# Patient Record
Sex: Female | Born: 1950 | Race: White | Hispanic: No | Smoking: Former smoker
Health system: Southern US, Community
[De-identification: ages and names within clinical notes are randomized; demographics above are authoritative.]

## PROBLEM LIST (undated history)

## (undated) DIAGNOSIS — Z8619 Personal history of other infectious and parasitic diseases: Secondary | ICD-10-CM

## (undated) DIAGNOSIS — I10 Essential (primary) hypertension: Secondary | ICD-10-CM

## (undated) DIAGNOSIS — M1990 Unspecified osteoarthritis, unspecified site: Secondary | ICD-10-CM

## (undated) DIAGNOSIS — C50919 Malignant neoplasm of unspecified site of unspecified female breast: Secondary | ICD-10-CM

## (undated) DIAGNOSIS — M8580 Other specified disorders of bone density and structure, unspecified site: Secondary | ICD-10-CM

## (undated) DIAGNOSIS — E785 Hyperlipidemia, unspecified: Secondary | ICD-10-CM

## (undated) DIAGNOSIS — I2510 Atherosclerotic heart disease of native coronary artery without angina pectoris: Secondary | ICD-10-CM

## (undated) DIAGNOSIS — S82839A Other fracture of upper and lower end of unspecified fibula, initial encounter for closed fracture: Secondary | ICD-10-CM

## (undated) DIAGNOSIS — I5189 Other ill-defined heart diseases: Secondary | ICD-10-CM

## (undated) DIAGNOSIS — I509 Heart failure, unspecified: Secondary | ICD-10-CM

## (undated) HISTORY — DX: Personal history of other infectious and parasitic diseases: Z86.19

## (undated) HISTORY — DX: Atherosclerotic heart disease of native coronary artery without angina pectoris: I25.10

## (undated) HISTORY — DX: Other fracture of upper and lower end of unspecified fibula, initial encounter for closed fracture: S82.839A

## (undated) HISTORY — DX: Essential (primary) hypertension: I10

## (undated) HISTORY — DX: Heart failure, unspecified: I50.9

## (undated) HISTORY — DX: Unspecified osteoarthritis, unspecified site: M19.90

## (undated) HISTORY — PX: MOLE REMOVAL: SHX2046

## (undated) HISTORY — DX: Hyperlipidemia, unspecified: E78.5

## (undated) HISTORY — PX: CATARACT EXTRACTION: SUR2

## (undated) HISTORY — DX: Malignant neoplasm of unspecified site of unspecified female breast: C50.919

## (undated) HISTORY — DX: Other specified disorders of bone density and structure, unspecified site: M85.80

---
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
## 1983-09-12 HISTORY — PX: ABDOMINAL HYSTERECTOMY: SHX81

## 1991-06-27 ENCOUNTER — Encounter: Payer: Self-pay | Admitting: Family Medicine

## 2000-01-12 ENCOUNTER — Encounter: Admission: RE | Admit: 2000-01-12 | Discharge: 2000-01-12 | Payer: Self-pay | Admitting: Emergency Medicine

## 2000-01-12 ENCOUNTER — Encounter: Payer: Self-pay | Admitting: Emergency Medicine

## 2001-01-17 ENCOUNTER — Encounter: Admission: RE | Admit: 2001-01-17 | Discharge: 2001-01-17 | Payer: Self-pay | Admitting: Emergency Medicine

## 2001-01-17 ENCOUNTER — Encounter: Payer: Self-pay | Admitting: Emergency Medicine

## 2002-01-30 ENCOUNTER — Encounter: Payer: Self-pay | Admitting: Emergency Medicine

## 2002-01-30 ENCOUNTER — Encounter: Admission: RE | Admit: 2002-01-30 | Discharge: 2002-01-30 | Payer: Self-pay | Admitting: Emergency Medicine

## 2002-02-05 ENCOUNTER — Encounter: Admission: RE | Admit: 2002-02-05 | Discharge: 2002-02-05 | Payer: Self-pay | Admitting: Emergency Medicine

## 2002-02-05 ENCOUNTER — Encounter: Payer: Self-pay | Admitting: Emergency Medicine

## 2002-08-26 ENCOUNTER — Encounter: Admission: RE | Admit: 2002-08-26 | Discharge: 2002-08-26 | Payer: Self-pay | Admitting: Emergency Medicine

## 2002-08-26 ENCOUNTER — Encounter: Payer: Self-pay | Admitting: Emergency Medicine

## 2002-09-10 ENCOUNTER — Encounter: Payer: Self-pay | Admitting: Emergency Medicine

## 2002-09-10 ENCOUNTER — Encounter: Admission: RE | Admit: 2002-09-10 | Discharge: 2002-09-10 | Payer: Self-pay | Admitting: Emergency Medicine

## 2002-09-11 DIAGNOSIS — C50919 Malignant neoplasm of unspecified site of unspecified female breast: Secondary | ICD-10-CM

## 2002-09-11 HISTORY — PX: MASTECTOMY: SHX3

## 2002-09-11 HISTORY — DX: Malignant neoplasm of unspecified site of unspecified female breast: C50.919

## 2002-09-15 ENCOUNTER — Encounter (INDEPENDENT_AMBULATORY_CARE_PROVIDER_SITE_OTHER): Payer: Self-pay | Admitting: *Deleted

## 2002-09-15 ENCOUNTER — Encounter: Payer: Self-pay | Admitting: Emergency Medicine

## 2002-09-15 ENCOUNTER — Encounter: Admission: RE | Admit: 2002-09-15 | Discharge: 2002-09-15 | Payer: Self-pay | Admitting: Emergency Medicine

## 2002-10-12 HISTORY — PX: BREAST SURGERY: SHX581

## 2002-10-24 ENCOUNTER — Encounter: Payer: Self-pay | Admitting: General Surgery

## 2002-10-28 ENCOUNTER — Encounter: Payer: Self-pay | Admitting: General Surgery

## 2002-10-28 ENCOUNTER — Ambulatory Visit (HOSPITAL_COMMUNITY): Admission: RE | Admit: 2002-10-28 | Discharge: 2002-10-29 | Payer: Self-pay | Admitting: General Surgery

## 2002-10-28 ENCOUNTER — Encounter (INDEPENDENT_AMBULATORY_CARE_PROVIDER_SITE_OTHER): Payer: Self-pay | Admitting: *Deleted

## 2003-04-06 ENCOUNTER — Encounter: Payer: Self-pay | Admitting: Emergency Medicine

## 2003-04-06 ENCOUNTER — Encounter: Admission: RE | Admit: 2003-04-06 | Discharge: 2003-04-06 | Payer: Self-pay | Admitting: Emergency Medicine

## 2004-05-23 ENCOUNTER — Encounter: Admission: RE | Admit: 2004-05-23 | Discharge: 2004-05-23 | Payer: Self-pay | Admitting: General Surgery

## 2005-06-26 ENCOUNTER — Encounter: Admission: RE | Admit: 2005-06-26 | Discharge: 2005-06-26 | Payer: Self-pay | Admitting: Emergency Medicine

## 2005-06-28 ENCOUNTER — Encounter: Admission: RE | Admit: 2005-06-28 | Discharge: 2005-06-28 | Payer: Self-pay | Admitting: General Surgery

## 2005-07-12 ENCOUNTER — Encounter: Admission: RE | Admit: 2005-07-12 | Discharge: 2005-07-12 | Payer: Self-pay | Admitting: General Surgery

## 2006-05-09 LAB — HM COLONOSCOPY: HM Colonoscopy: ABNORMAL

## 2006-08-01 ENCOUNTER — Encounter: Admission: RE | Admit: 2006-08-01 | Discharge: 2006-08-01 | Payer: Self-pay | Admitting: General Surgery

## 2007-08-29 ENCOUNTER — Encounter: Admission: RE | Admit: 2007-08-29 | Discharge: 2007-08-29 | Payer: Self-pay | Admitting: Emergency Medicine

## 2007-09-12 LAB — CONVERTED CEMR LAB: Pap Smear: NORMAL

## 2008-04-11 HISTORY — PX: COLONOSCOPY: SHX174

## 2008-09-09 ENCOUNTER — Encounter: Admission: RE | Admit: 2008-09-09 | Discharge: 2008-09-09 | Payer: Self-pay | Admitting: General Surgery

## 2008-09-09 LAB — HM MAMMOGRAPHY: HM Mammogram: ABNORMAL

## 2008-09-17 ENCOUNTER — Encounter: Admission: RE | Admit: 2008-09-17 | Discharge: 2008-09-17 | Payer: Self-pay | Admitting: General Surgery

## 2008-09-28 ENCOUNTER — Ambulatory Visit: Payer: Self-pay | Admitting: Family Medicine

## 2008-09-28 DIAGNOSIS — D0510 Intraductal carcinoma in situ of unspecified breast: Secondary | ICD-10-CM | POA: Insufficient documentation

## 2008-09-28 DIAGNOSIS — E785 Hyperlipidemia, unspecified: Secondary | ICD-10-CM | POA: Insufficient documentation

## 2008-09-28 DIAGNOSIS — M1990 Unspecified osteoarthritis, unspecified site: Secondary | ICD-10-CM | POA: Insufficient documentation

## 2008-09-28 DIAGNOSIS — I10 Essential (primary) hypertension: Secondary | ICD-10-CM | POA: Insufficient documentation

## 2008-09-28 DIAGNOSIS — J309 Allergic rhinitis, unspecified: Secondary | ICD-10-CM | POA: Insufficient documentation

## 2008-09-28 DIAGNOSIS — E782 Mixed hyperlipidemia: Secondary | ICD-10-CM | POA: Insufficient documentation

## 2008-09-28 HISTORY — DX: Hyperlipidemia, unspecified: E78.5

## 2008-09-28 HISTORY — DX: Unspecified osteoarthritis, unspecified site: M19.90

## 2008-11-09 DIAGNOSIS — S82839A Other fracture of upper and lower end of unspecified fibula, initial encounter for closed fracture: Secondary | ICD-10-CM

## 2008-11-09 HISTORY — DX: Other fracture of upper and lower end of unspecified fibula, initial encounter for closed fracture: S82.839A

## 2008-11-16 ENCOUNTER — Ambulatory Visit: Payer: Self-pay | Admitting: Family Medicine

## 2008-11-16 DIAGNOSIS — M8580 Other specified disorders of bone density and structure, unspecified site: Secondary | ICD-10-CM

## 2008-11-16 HISTORY — DX: Other specified disorders of bone density and structure, unspecified site: M85.80

## 2008-11-17 ENCOUNTER — Telehealth: Payer: Self-pay | Admitting: Family Medicine

## 2008-11-23 ENCOUNTER — Ambulatory Visit: Payer: Self-pay | Admitting: Family Medicine

## 2008-12-10 ENCOUNTER — Ambulatory Visit: Payer: Self-pay | Admitting: Family Medicine

## 2008-12-10 DIAGNOSIS — M719 Bursopathy, unspecified: Secondary | ICD-10-CM

## 2008-12-10 DIAGNOSIS — M67919 Unspecified disorder of synovium and tendon, unspecified shoulder: Secondary | ICD-10-CM | POA: Insufficient documentation

## 2008-12-28 ENCOUNTER — Ambulatory Visit: Payer: Self-pay | Admitting: Family Medicine

## 2008-12-28 DIAGNOSIS — R5383 Other fatigue: Secondary | ICD-10-CM

## 2008-12-28 DIAGNOSIS — R5381 Other malaise: Secondary | ICD-10-CM | POA: Insufficient documentation

## 2008-12-28 LAB — CONVERTED CEMR LAB
ALT: 55 units/L — ABNORMAL HIGH (ref 0–35)
AST: 37 units/L (ref 0–37)
Albumin: 3.9 g/dL (ref 3.5–5.2)
Alkaline Phosphatase: 72 units/L (ref 39–117)
BUN: 12 mg/dL (ref 6–23)
Basophils Absolute: 0 10*3/uL (ref 0.0–0.1)
Basophils Relative: 0.5 % (ref 0.0–3.0)
Bilirubin, Direct: 0 mg/dL (ref 0.0–0.3)
CO2: 34 meq/L — ABNORMAL HIGH (ref 19–32)
Calcium: 9.3 mg/dL (ref 8.4–10.5)
Chloride: 102 meq/L (ref 96–112)
Cholesterol: 122 mg/dL (ref 0–200)
Creatinine, Ser: 0.6 mg/dL (ref 0.4–1.2)
Eosinophils Absolute: 0.1 10*3/uL (ref 0.0–0.7)
Eosinophils Relative: 2.1 % (ref 0.0–5.0)
GFR calc non Af Amer: 109.32 mL/min (ref >60)
Glucose, Bld: 101 mg/dL — ABNORMAL HIGH (ref 70–99)
HCT: 40.1 % (ref 36.0–46.0)
HDL: 41.7 mg/dL (ref >39.00)
Hemoglobin: 13.8 g/dL (ref 12.0–15.0)
LDL Cholesterol: 44 mg/dL (ref 0–99)
Lymphocytes Relative: 31.5 % (ref 12.0–46.0)
Lymphs Abs: 1.9 10*3/uL (ref 0.7–4.0)
MCHC: 34.5 g/dL (ref 30.0–36.0)
MCV: 87.3 fL (ref 78.0–100.0)
Monocytes Absolute: 0.6 10*3/uL (ref 0.1–1.0)
Monocytes Relative: 9.2 % (ref 3.0–12.0)
Neutro Abs: 3.5 10*3/uL (ref 1.4–7.7)
Neutrophils Relative %: 56.7 % (ref 43.0–77.0)
Platelets: 285 10*3/uL (ref 150.0–400.0)
Potassium: 3.3 meq/L — ABNORMAL LOW (ref 3.5–5.1)
RBC: 4.59 M/uL (ref 3.87–5.11)
RDW: 12.9 % (ref 11.5–14.6)
Sodium: 143 meq/L (ref 135–145)
TSH: 1.92 microintl units/mL (ref 0.35–5.50)
Total Bilirubin: 0.7 mg/dL (ref 0.3–1.2)
Total CHOL/HDL Ratio: 3
Total Protein: 7 g/dL (ref 6.0–8.3)
Triglycerides: 184 mg/dL — ABNORMAL HIGH (ref 0.0–149.0)
VLDL: 36.8 mg/dL (ref 0.0–40.0)
WBC: 6.1 10*3/uL (ref 4.5–10.5)

## 2008-12-29 ENCOUNTER — Encounter: Payer: Self-pay | Admitting: Family Medicine

## 2008-12-31 ENCOUNTER — Ambulatory Visit: Payer: Self-pay | Admitting: Family Medicine

## 2009-01-28 ENCOUNTER — Encounter: Payer: Self-pay | Admitting: Family Medicine

## 2009-03-30 ENCOUNTER — Ambulatory Visit: Payer: Self-pay | Admitting: Family Medicine

## 2009-03-31 ENCOUNTER — Ambulatory Visit: Payer: Self-pay | Admitting: Family Medicine

## 2009-03-31 LAB — CONVERTED CEMR LAB
ALT: 50 units/L — ABNORMAL HIGH (ref 0–35)
AST: 33 units/L (ref 0–37)
Albumin: 3.8 g/dL (ref 3.5–5.2)
Alkaline Phosphatase: 75 units/L (ref 39–117)
Bilirubin, Direct: 0 mg/dL (ref 0.0–0.3)
Total Bilirubin: 0.6 mg/dL (ref 0.3–1.2)
Total Protein: 6.9 g/dL (ref 6.0–8.3)

## 2009-04-01 ENCOUNTER — Encounter: Payer: Self-pay | Admitting: Family Medicine

## 2009-04-02 LAB — CONVERTED CEMR LAB
HCV Ab: NEGATIVE
Hep A IgM: NEGATIVE
Hep B C IgM: NEGATIVE
Hepatitis B Surface Ag: NEGATIVE

## 2009-04-05 ENCOUNTER — Ambulatory Visit: Payer: Self-pay | Admitting: Family Medicine

## 2009-10-05 ENCOUNTER — Ambulatory Visit: Payer: Self-pay | Admitting: Family Medicine

## 2009-10-08 LAB — CONVERTED CEMR LAB
ALT: 101 units/L — ABNORMAL HIGH (ref 0–35)
AST: 67 units/L — ABNORMAL HIGH (ref 0–37)
Albumin: 4.3 g/dL (ref 3.5–5.2)
Alkaline Phosphatase: 84 units/L (ref 39–117)
Bilirubin, Direct: 0.1 mg/dL (ref 0.0–0.3)
Cholesterol: 213 mg/dL — ABNORMAL HIGH (ref 0–200)
Direct LDL: 76.2 mg/dL
HDL: 50.9 mg/dL (ref >39.00)
Total Bilirubin: 0.6 mg/dL (ref 0.3–1.2)
Total CHOL/HDL Ratio: 4
Total Protein: 7.5 g/dL (ref 6.0–8.3)
Triglycerides: 268 mg/dL — ABNORMAL HIGH (ref 0.0–149.0)
VLDL: 53.6 mg/dL — ABNORMAL HIGH (ref 0.0–40.0)

## 2009-10-11 ENCOUNTER — Ambulatory Visit: Payer: Self-pay | Admitting: Family Medicine

## 2009-10-12 ENCOUNTER — Encounter: Admission: RE | Admit: 2009-10-12 | Discharge: 2009-10-12 | Payer: Self-pay | Admitting: General Surgery

## 2009-10-14 ENCOUNTER — Encounter: Admission: RE | Admit: 2009-10-14 | Discharge: 2009-10-14 | Payer: Self-pay | Admitting: Family Medicine

## 2009-10-15 ENCOUNTER — Ambulatory Visit: Payer: Self-pay | Admitting: Family Medicine

## 2009-10-15 LAB — CONVERTED CEMR LAB: Creatinine, Ser: 0.72 mg/dL (ref 0.40–1.20)

## 2009-10-21 ENCOUNTER — Ambulatory Visit: Payer: Self-pay | Admitting: Cardiology

## 2009-11-10 ENCOUNTER — Ambulatory Visit: Payer: Self-pay | Admitting: Family Medicine

## 2010-01-06 ENCOUNTER — Ambulatory Visit: Payer: Self-pay | Admitting: Family Medicine

## 2010-01-07 LAB — CONVERTED CEMR LAB
ALT: 81 units/L — ABNORMAL HIGH (ref 0–35)
AST: 53 units/L — ABNORMAL HIGH (ref 0–37)
Albumin: 3.9 g/dL (ref 3.5–5.2)
Alkaline Phosphatase: 81 units/L (ref 39–117)
Bilirubin, Direct: 0.1 mg/dL (ref 0.0–0.3)
Total Bilirubin: 0.5 mg/dL (ref 0.3–1.2)
Total Protein: 6.6 g/dL (ref 6.0–8.3)

## 2010-06-10 ENCOUNTER — Telehealth: Payer: Self-pay | Admitting: Family Medicine

## 2010-07-14 ENCOUNTER — Telehealth (INDEPENDENT_AMBULATORY_CARE_PROVIDER_SITE_OTHER): Payer: Self-pay | Admitting: *Deleted

## 2010-07-18 ENCOUNTER — Ambulatory Visit: Payer: Self-pay | Admitting: Family Medicine

## 2010-07-18 LAB — CONVERTED CEMR LAB
ALT: 57 units/L — ABNORMAL HIGH (ref 0–35)
AST: 37 units/L (ref 0–37)
Albumin: 4.2 g/dL (ref 3.5–5.2)
Alkaline Phosphatase: 79 units/L (ref 39–117)
BUN: 12 mg/dL (ref 6–23)
Basophils Absolute: 0 10*3/uL (ref 0.0–0.1)
Basophils Relative: 0 % (ref 0.0–3.0)
Bilirubin, Direct: 0 mg/dL (ref 0.0–0.3)
CO2: 33 meq/L — ABNORMAL HIGH (ref 19–32)
Calcium: 9.8 mg/dL (ref 8.4–10.5)
Chloride: 98 meq/L (ref 96–112)
Cholesterol: 289 mg/dL — ABNORMAL HIGH (ref 0–200)
Creatinine, Ser: 0.6 mg/dL (ref 0.4–1.2)
Direct LDL: 104.2 mg/dL
Eosinophils Absolute: 0.1 10*3/uL (ref 0.0–0.7)
Eosinophils Relative: 1.6 % (ref 0.0–5.0)
GFR calc non Af Amer: 113.07 mL/min (ref >60)
Glucose, Bld: 113 mg/dL — ABNORMAL HIGH (ref 70–99)
HCT: 42.6 % (ref 36.0–46.0)
HDL: 50.4 mg/dL (ref >39.00)
Hemoglobin: 14.6 g/dL (ref 12.0–15.0)
Lymphocytes Relative: 25.3 % (ref 12.0–46.0)
Lymphs Abs: 2.2 10*3/uL (ref 0.7–4.0)
MCHC: 34.3 g/dL (ref 30.0–36.0)
MCV: 88.8 fL (ref 78.0–100.0)
Monocytes Absolute: 0.8 10*3/uL (ref 0.1–1.0)
Monocytes Relative: 9.1 % (ref 3.0–12.0)
Neutro Abs: 5.5 10*3/uL (ref 1.4–7.7)
Neutrophils Relative %: 64 % (ref 43.0–77.0)
Platelets: 278 10*3/uL (ref 150.0–400.0)
Potassium: 3.9 meq/L (ref 3.5–5.1)
RBC: 4.8 M/uL (ref 3.87–5.11)
RDW: 14.1 % (ref 11.5–14.6)
Sodium: 140 meq/L (ref 135–145)
Total Bilirubin: 0.4 mg/dL (ref 0.3–1.2)
Total CHOL/HDL Ratio: 6
Total Protein: 6.9 g/dL (ref 6.0–8.3)
Triglycerides: 394 mg/dL — ABNORMAL HIGH (ref 0.0–149.0)
VLDL: 78.8 mg/dL — ABNORMAL HIGH (ref 0.0–40.0)
Vit D, 25-Hydroxy: 52 ng/mL (ref 30–89)
WBC: 8.5 10*3/uL (ref 4.5–10.5)

## 2010-08-30 ENCOUNTER — Ambulatory Visit: Payer: Self-pay | Admitting: Family Medicine

## 2010-08-31 DIAGNOSIS — L919 Hypertrophic disorder of the skin, unspecified: Secondary | ICD-10-CM | POA: Insufficient documentation

## 2010-08-31 DIAGNOSIS — L909 Atrophic disorder of skin, unspecified: Secondary | ICD-10-CM | POA: Insufficient documentation

## 2010-08-31 LAB — CONVERTED CEMR LAB
Cholesterol, target level: 200 mg/dL
HDL goal, serum: 40 mg/dL
LDL Goal: 130 mg/dL

## 2010-10-02 ENCOUNTER — Encounter: Payer: Self-pay | Admitting: Emergency Medicine

## 2010-10-02 ENCOUNTER — Encounter: Payer: Self-pay | Admitting: General Surgery

## 2010-10-11 NOTE — Assessment & Plan Note (Signed)
Summary: COUGH/CLE   Vital Signs:  Patient profile:   60 year old female Height:      66.25 inches Weight:      222.0 pounds BMI:     35.69 Temp:     97.8 degrees F oral Pulse rate:   76 / minute Pulse rhythm:   regular BP sitting:   110 / 70  (left arm) Cuff size:   large  Vitals Entered By: Benny Lennert CMA Duncan Dull) (November 10, 2009 9:25 AM)  History of Present Illness: Chief complaint cough and congestion for 1 week   This 60 Years Old White Female comes in today with complaints of cough, runny nose, and sore throat.   ros: no fever, chills, sweats, n, v, d, rashes, earache  GEN: WDWN, NAD; alert,appropriate and cooperative throughout exam HEENT: Normocephalic and atraumatic. Throat clear, w/o exudate, no LAD, R TM clear, L TM - good landmarks, No fluid present. rhinnorhea.  Left frontal and maxillary sinuses: NT Right frontal and maxillary sinuses: NT NECK: No ant or post LAD CV: RRR, No M/G/R PULM: no resp distress, no accessory muscles.  No retractions. no w/c/r ABD: S,NT,ND,+BS, No HSM EXTR: no c/c/e PSYCH: full affect, pleasant, conversant   Allergies: 1)  ! Sulfa 2)  ! Morphine 3)  ! Ace Inhibitors   Impression & Recommendations:  Problem # 1:  URI (ICD-465.9) I discussed upper respiratory tract infections with the patient and explained viral infections in general.  Recommended plenty of sleep. Symptomatic care with pushing fluids. Symptomatic care with over-the-counter expectorant, such as Mucinex DM or Robitussin-DM, including a cough suppressant. Oral acetaminophen or NSAIDs as tolerated for body aches, chills, fevers.  follow-up if acutely worsens   Her updated medication list for this problem includes:    Adult Aspirin Ec Low Strength 81 Mg Tbec (Aspirin) .[name] Kitchen... Take one tablet once daily  Complete Medication List: 1)  Fluoxetine Hcl 20 Mg Tabs (Fluoxetine hcl) .... Take one tablet once daily 2)  Chlorthalidone 25 Mg Tabs (Chlorthalidone) ....  Take one tablet once daily 3)  Multivitamins Tabs (Multiple vitamin) .... Take one tablet once daily 4)  One-a-day Calcium Plus 500-100-50 Mg-unit-mg Chew (Calcium-magnesium-vitamin d) .... Take one tablet once daily 5)  Adult Aspirin Ec Low Strength 81 Mg Tbec (Aspirin) .... Take one tablet once daily 6)  Osteo Bi-flex Adv Double St Tabs (Misc natural products) .... Take two tablets once daily 7)  Vision Formula Tabs (Multiple vitamins-minerals) .... Take one tablet once daily 8)  Vitamin D 3000 Units  .... Take one tablet once daily 9)  Hycodan Suspension  .[name] Kitchen.. 1 tsp by mouth at bedtime Prescriptions: HYCODAN SUSPENSION 1 tsp by mouth at bedtime  #8 oz x 0   Entered and Authorized by:   Hannah Beat MD   Signed by:   Hannah Beat MD on 11/10/2009   Method used:   Print then Give to Patient   RxID:   [id number] HYCODAN SUSPENSION 1 tsp by mouth at bedtime  #8 oz x 0   Entered and Authorized by:   Hannah Beat MD   Signed by:   Hannah Beat MD on 11/10/2009   Method used:   Print then Give to Patient   RxID:   [id number]   Current Allergies (reviewed today): ! SULFA ! MORPHINE ! ACE INHIBITORS

## 2010-10-11 NOTE — Letter (Signed)
Summary: HAND REHABILITATION SPEC / PHYSICAL THERAPY STATUS REPORT  HAND REHABILITATION SPEC / PHYSICAL THERAPY STATUS REPORT   Imported By: Carin Primrose 12/29/2008 12:44:00  _____________________________________________________________________  External Attachment:    Type:   Image     Comment:   External Document  Appended Document: HAND REHABILITATION SPEC / PHYSICAL THERAPY STATUS REPORT altered order and faxed

## 2010-10-11 NOTE — Assessment & Plan Note (Signed)
Summary: NEW PT TO EST/CLE   Vital Signs:  Patient Profile:   60 Years Old Female Height:     66.25 inches (168.28 cm) Weight:      223.50 pounds (101.59 kg) Temp:     98.4 degrees F (36.89 degrees C) oral Pulse rate:   76 / minute Pulse rhythm:   regular BP sitting:   126 / 84  (left arm) Cuff size:   large  Vitals Entered By: Silas Sacramento (September 28, 2008 10:59 AM)                 Chief Complaint:  New pt.  History of Present Illness: 60 year old female patient here to establish care as new patient in our office:  1. 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 No chemo or rads.  2. HTN, stable, on Cozaar and chlorthalidone.  3. Hyperlipidemia, now on Crestor 40 mg, labs last checked in the fall.   4. Estrogen deficiency s/p oophorectomy, on Prozac for hot flashes. Had been on estrogen for years. All her paps have been normal post hysterectomy. Should not need Paps.  DEXA, on some Calcium and Vit D.     Current Allergies (reviewed today): ! SULFA ! MORPHINE ! ACE INHIBITORS  Past Medical History:    Hyperlipidemia    Hypertension    Allergic rhinitis    Breast cancer, hx of, L mastectomy, No c/r    Osteoarthritis        Former Dr. Lorenz Coaster pt.  Past Surgical History:    Hysterectomy, 1985, endometriosis, anatomy    Tonsillectomy, 1958    Mastectomy, 2004        DEXA, repeat 08/2010   Family History:    Alcoholism, Drug Addiction: 0    Colon CA: 0    Ovarian/Uterine CA: Aunt - Ovarian    Breast CA: Sister, 26    Lung CA: 0    Prostate CA: others    CAD: Father, MI, 53    CVA: 0    Sudden death < 50: 0    DM: GP    Mental Illness: 0   Social History:    Married    [place], 36 years    Never Smoked    Alcohol use-no    Drug use-no    Regular exercise-no   Risk Factors:  Tobacco use:  never Drug use:  no Alcohol use:  no Exercise:  no  Mammogram History:     Date of Last Mammogram:  09/09/2008    Mammogram History:     Date of Last Mammogram:  09/09/2008    Results:  abnormal   PAP Smear History:     Date of Last PAP Smear:  09/12/2007   PAP Smear History:     Date of Last PAP Smear:  09/12/2007    Results:  normal   Colonoscopy History:     Date of Last Colonoscopy:  09/11/2005   Colonoscopy History:     Date of Last Colonoscopy:  09/11/2005    Results:  normal    Review of Systems      See HPI       Otherwise, the pertinent positives and negatives are listed above and in the HPI, otherwise a full review of systems has been reviewed and is negative unless noted positive.   General      Denies chills, fever, sweats, weakness, and weight loss.  ENT      mouth irritated  and rubbed raw by dental partial, has upcoming appt.  CV      Denies chest pain or discomfort.  Resp      Denies shortness of breath.  GI      Denies nausea and vomiting.  GU      Denies urinary frequency and urinary hesitancy.  MS      some neck OA, bothering her now  Derm      Denies rash.  Neuro      Denies numbness and tingling.  Psych      Denies anxiety and depression.   Physical Exam  General:     Well-developed,well-nourished,in no acute distress; alert,appropriate and cooperative throughout examination Head:     Normocephalic and atraumatic without obvious abnormalities. No apparent alopecia or balding. Eyes:     vision grossly intact.   Ears:     External ear exam shows no significant lesions or deformities.  Otoscopic examination reveals clear canals, tympanic membranes are intact bilaterally without bulging, retraction, inflammation or discharge. Hearing is grossly normal bilaterally. Nose:     no external deformity.   Mouth:     Oral mucosa and oropharynx without lesions or exudates.  Teeth in good repair. Neck:     No deformities, masses, or tenderness noted. Lungs:     Normal respiratory effort, chest expands symmetrically. Lungs are clear to auscultation,  no crackles or wheezes. Heart:     Normal rate and regular rhythm. S1 and S2 normal without gallop, murmur, click, rub or other extra sounds. Abdomen:     Bowel sounds positive,abdomen soft and non-tender without masses, organomegaly or hernias noted. Msk:     normal ROM.   Extremities:     No clubbing, cyanosis, edema, or deformity noted with normal full range of motion of all joints.   Neurologic:     alert & oriented X3, sensation intact to light touch, and gait normal.   Skin:     Intact without suspicious lesions or rashes Cervical Nodes:     No lymphadenopathy noted Psych:     Cognition and judgment appear intact. Alert and cooperative with normal attention span and concentration. No apparent delusions, illusions, hallucinations    Impression & Recommendations:  Problem # 1:  HYPERTENSION (ICD-401.9) Assessment: New Cont current meds.  We will obtain records from the patient's prior physicians.   Her updated medication list for this problem includes:    Cozaar 50 Mg Tabs (Losartan potassium) .[name] Kitchen... Take one tablet once daily    Chlorthalidone 25 Mg Tabs (Chlorthalidone) .[name] Kitchen... Take one tablet once daily   Problem # 2:  HYPERLIPIDEMIA (ICD-272.4) Assessment: New Cont, and fish oil, recheck 3 mo  Her updated medication list for this problem includes:    Crestor 40 Mg Tabs (Rosuvastatin calcium) .[name] Kitchen... Take one tablet once daily   Problem # 3:  BREAST CANCER, HX OF (ICD-V10.3) Assessment: New doing well, s/p mastectomy  We will obtain records from the patient's prior physicians.   Problem # 4:  HOT FLASHES (ICD-627.2) Assessment: New Prozac is reasonable, no estrogen.  Should not need yearly Paps, given all normal paps post surgery, 1980's, and oophorectomy, too.  Problem # 5:  UNSPECIFIED VITAMIN D DEFICIENCY (ICD-268.9) Assessment: New Cont supplement. Recheck in April.  Complete Medication List: 1)  Cozaar 50 Mg Tabs (Losartan potassium) .... Take one  tablet once daily 2)  Fluoxetine Hcl 20 Mg Tabs (Fluoxetine hcl) .... Take one tablet once daily 3)  Crestor 40 Mg Tabs (  Rosuvastatin calcium) .... Take one tablet once daily 4)  Chlorthalidone 25 Mg Tabs (Chlorthalidone) .... Take one tablet once daily 5)  Multivitamins Tabs (Multiple vitamin) .... Take one tablet once daily 6)  One-a-day Calcium Plus 500-100-50 Mg-unit-mg Chew (Calcium-magnesium-vitamin d) .... Take one tablet once daily 7)  Adult Aspirin Ec Low Strength 81 Mg Tbec (Aspirin) .... Take one tablet once daily 8)  Osteo Bi-flex Adv Double St Tabs (Misc natural products) .... Take two tablets once daily 9)  Fish Oil 1000 Mg Caps (Omega-3 fatty acids) .... Take four tablets once daily 10)  Vision Formula Tabs (Multiple vitamins-minerals) .... Take one tablet once daily 11)  Vitamin D 3000 Units  .... Take one tablet once daily   Patient Instructions: 1)  follow-up April, fasting office visit.    Prior Medications (reviewed today): COZAAR 50 MG TABS (LOSARTAN POTASSIUM) take one tablet once daily FLUOXETINE HCL 20 MG TABS (FLUOXETINE HCL) take one tablet once daily CRESTOR 40 MG TABS (ROSUVASTATIN CALCIUM) take one tablet once daily CHLORTHALIDONE 25 MG TABS (CHLORTHALIDONE) take one tablet once daily MULTIVITAMINS  TABS (MULTIPLE VITAMIN) take one tablet once daily ONE-A-DAY CALCIUM PLUS 500-100-50 MG-UNIT-MG CHEW (CALCIUM-MAGNESIUM-VITAMIN D) take one tablet once daily ADULT ASPIRIN EC LOW STRENGTH 81 MG TBEC (ASPIRIN) take one tablet once daily OSTEO BI-FLEX ADV DOUBLE ST  TABS (MISC NATURAL PRODUCTS) take two tablets once daily FISH OIL 1000 MG CAPS (OMEGA-3 FATTY ACIDS) take four tablets once daily VISION FORMULA  TABS (MULTIPLE VITAMINS-MINERALS) take one tablet once daily VITAMIN D 3000 UNITS () take one tablet once daily Current Allergies (reviewed today): ! SULFA ! MORPHINE ! ACE INHIBITORS  Flu Vaccine Result Date:  08/11/2008 Flu Vaccine Result:  given  Colonoscopy Result Date:  09/11/2005 Colonoscopy Result:  normal PAP Result Date:  09/12/2007 PAP Result:  normal PAP Next Due:  Not Indicated Mammogram Result Date:  09/09/2008 Mammogram Result:  abnormal had f/u Mammo and u/s 09/18/07 - OK, repeat 1 year only

## 2010-10-11 NOTE — Assessment & Plan Note (Signed)
Summary: ROA 6 MTHS CYD   Vital Signs:  Patient profile:   60 year old female Height:      66.25 inches Weight:      219.8 pounds BMI:     35.34 Temp:     97.9 degrees F oral Pulse rate:   76 / minute Pulse rhythm:   regular BP sitting:   122 / 76  (left arm) Cuff size:   large  Vitals Entered By: Benny Lennert CMA Duncan Dull) (October 11, 2009 3:21 PM)  History of Present Illness: Chief complaint 6 month follow up  Elevated transaminases: labs reviewed with her, and these are trending upwards. No alcohol use. Off hepatotoxic meds. Hepatitis negative   HTN: BP at goal  mammogram, upcoming tomorrow  flu shot   u/s liver  ROS: GEN: No acute illnesses, no fevers, chills, sweats, fatigue, weight loss, or URI sx. GI: No n/v/d Pulm: No SOB, cough, wheezing Interactive and getting along well at home.  Otherwise, ROS is as per the HPI.   GEN: WDWN, NAD, Non-toxic, A & O x 3 HEENT: Atraumatic, Normocephalic. Neck supple. No masses, No LAD. Ears and Nose: No external deformity. CV: RRR, No M/G/R. No JVD. No thrill. No extra heart sounds. PULM: CTA B, no wheezes, crackles, rhonchi. No retractions. No resp. distress. No accessory muscle use. EXTR: No c/c/e NEURO: Normal gait.  PSYCH: Normally interactive. Conversant. Not depressed or anxious appearing.  Calm demeanor.      Allergies: 1)  ! Sulfa 2)  ! Morphine 3)  ! Ace Inhibitors  Past History:  Past medical, surgical, family and social histories (including risk factors) reviewed, and no changes noted (except as noted below).  Past Medical History: Reviewed history from 12/10/2008 and no changes required. 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 Hep B + by [facility] Foot fracture, 1991 L fibular avulsion fx and sprain, 11/2008  Former Dr. Lorenz Coaster pt. [facility] Sur[facility], Dr. Francina Ames - NOW Dr. Claud Kelp GI:  Dr. Everardo All. [facility] = [facility], Dorcas Mcmurray  Past Surgical History: Reviewed history from 10/13/2008 and no changes required. Hysterectomy, 1985, endometriosis, anatomy Tonsillectomy, 1958 Mastectomy, 2004  DEXA, repeat 08/2010 Colonoscopy, 8/09, Hyperplastic Polyp, repeat 10 years  Family History: Reviewed history from 10/13/2008 and no changes required. Alcoholism, Drug Addiction: 0 Colon CA: 0 Ovarian/Uterine CA: Aunt - Ovarian Breast CA: Sister, 57 Lung CA: 0 Prostate CA: others CAD: Father, MI, 28 CVA: 0 Sudden death < 50: 0 DM: GP Mental Illness: 0   Mother, Sarcoid  Social History: Reviewed history from 09/28/2008 and no changes required. Married [employer], 36 years Never Smoked Alcohol use-no Drug use-no Regular exercise-no   Impression & Recommendations:  Problem # 1:  LIVER FUNCTION TESTS, ABNORMAL, HX OF (ICD-V12.2) Check u/s of abdomen to evaluate liver - recheck LFT's in 3 months, or alter POC based on u/s  Orders: Radiology Referral (Radiology)  Problem # 2:  HYPERTENSION (ICD-401.9) doing well on current meds  The following medications were removed from the medication list:    Cozaar 50 Mg Tabs (Losartan potassium) .[name] Kitchen... Take one tablet once daily Her updated medication list for this problem includes:    Chlorthalidone 25 Mg Tabs (Chlorthalidone) .[name] Kitchen... Take one tablet once daily  Complete Medication List: 1)  Fluoxetine Hcl 20 Mg Tabs (Fluoxetine hcl) .... Take one tablet once daily 2)  Chlorthalidone 25 Mg Tabs (Chlorthalidone) .[name] KitchenMarland KitchenMarland Kitchen  Take one tablet once daily 3)  Multivitamins Tabs (Multiple vitamin) .... Take one tablet once daily 4)  One-a-day Calcium Plus 500-100-50 Mg-unit-mg Chew (Calcium-magnesium-vitamin d) .... Take one tablet once daily 5)  Adult Aspirin Ec Low Strength 81 Mg Tbec (Aspirin) .... Take one tablet once daily 6)  Osteo Bi-flex Adv Double St Tabs (Misc natural products) .... Take two tablets once  daily 7)  Vision Formula Tabs (Multiple vitamins-minerals) .... Take one tablet once daily 8)  Vitamin D 3000 Units  .... Take one tablet once daily  Other Orders: Future Orders: Admin 1st Vaccine (04540) ... 10/12/2009 Flu Vaccine 78yrs + ([id number]) ... 10/12/2009  Patient Instructions: 1)  Referral Appointment Information 2)  Day/Date: 3)  Time: 4)  Place/MD: 5)  Address: 6)  Phone/Fax: 7)  Patient given appointment information. Information/Orders faxed/mailed.  8)  repeat LFT's in 3 months: v12.2  Current Allergies (reviewed today): ! SULFA ! MORPHINE ! ACE INHIBITORS    Prevention & Chronic Care Immunizations   Influenza vaccine: Fluvax 3+  (10/11/2009)   Influenza vaccine due: 08/11/2009    Tetanus booster: Not documented    Pneumococcal vaccine: Not documented  Colorectal Screening   Hemoccult: Not documented    Colonoscopy: abnormal  (05/09/2006)   Colonoscopy due: 05/09/2016  Other Screening   Pap smear: normal  (09/12/2007)   Pap smear due: Not Indicated    Mammogram: abnormal  (09/09/2008)   Mammogram action/deferral: Ordered  (10/11/2009)   Mammogram due: 09/09/2009   Smoking status: never  (09/28/2008)  Lipids   Total Cholesterol: 213  (10/05/2009)   LDL: 44  (12/28/2008)   LDL Direct: 76.2  (10/05/2009)   HDL: 50.90  (10/05/2009)   Triglycerides: 268.0  (10/05/2009)    SGOT (AST): 67  (10/05/2009)   SGPT (ALT): 101  (10/05/2009)   Alkaline phosphatase: 84  (10/05/2009)   Total bilirubin: 0.6  (10/05/2009)    Lipid flowsheet reviewed?: Yes   Progress toward LDL goal: Deteriorated  Hypertension   Last Blood Pressure: 122 / 76  (10/11/2009)   Serum creatinine: 0.6  (12/28/2008)   Serum potassium 3.3  (12/28/2008)    Hypertension flowsheet reviewed?: Yes   Progress toward BP goal: At goal    Nursing Instructions: Give Flu vaccine today                Flu Vaccine Consent Questions     Do you have a history of severe  allergic reactions to this vaccine? no    Any prior history of allergic reactions to egg and/or gelatin? no    Do you have a sensitivity to the preservative Thimersol? no    Do you have a past history of Guillan-Barre Syndrome? no    Do you currently have an acute febrile illness? no    Have you ever had a severe reaction to latex? no    Vaccine information given and explained to patient? yes    Are you currently pregnant? no    Lot Number:[id number]   Exp Date:03/10/2010   Site Given  Left Deltoid IMlbflu

## 2010-10-11 NOTE — Progress Notes (Signed)
----   Converted from flag ---- ---- 07/14/2010 7:47 AM, Hannah Beat MD wrote: FLP: 272.4 BMP: v58.69 Hepatic Function Panel: v58.69 CBC with diff: v58.69 Vit D: (re: vit D deficiency)  ---- 07/14/2010 7:45 AM, Liane Comber CMA (AAMA) wrote: Lab orders please! Good Morning! This pt is scheduled for cpx labs Monday, which labs to draw and dx codes to use? Thanks Tasha ------------------------------

## 2010-10-11 NOTE — Letter (Signed)
Summary: Pt's list of Prescriptions,Supplements,Allergies,Surgeries broug  Pt's list of Prescriptions,Supplements,Allergies,Surgeries brough to Office Visit   Imported By: Beau Fanny 09/29/2008 10:49:43  _____________________________________________________________________  External Attachment:    Type:   Image     Comment:   External Document

## 2010-10-11 NOTE — Assessment & Plan Note (Signed)
Summary: APRIL FOLLOW UP /[facility]   Vital Signs:  Patient profile:   60 year old female Weight:      222 pounds Temp:     98.1 degrees F oral Pulse rate:   76 / minute Pulse rhythm:   regular BP sitting:   120 / 80  (left arm) Cuff size:   large  Vitals Entered By: Mervin Hack CMA (December 31, 2008 8:10 AM)  History of Present Illness: CC: follow-up  60 year old female here for follow-up, multiple issues:  HTN: stable, on meds currently  Reviewed all labs  B LE: Lateral epicondylitis continues, somewhat improved, has lost her tennis elbow straps. Going to PT, altered her biomechanics.   R RTC tendonitis has gotten a lot better, doing her RTC tendonitis and scapular stabilization with hand and rehab.   L RTC tendonitis, cont with a lot of pain and impingement.  All in all has been making progress, L RTC is lagging behind.  LFT's are elevated - still were elevated last year.    Allergies: 1)  ! Sulfa 2)  ! Morphine 3)  ! Ace Inhibitors  Past History:  Past medical, surgical, family and social histories (including risk factors) reviewed, and no changes noted (except as noted below).  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 Hep B + by [employer]    Foot fracture, 1991    L fibular avulsion fx and sprain, 11/2008        Former Dr. Lorenz Coaster pt.    [facility]    Breast Surgeon, Dr. Francina Ames - NOW Dr. Claud Kelp    GI: Dr. Everardo All. Barrett Shell    Derm = Arminda Resides, Dorcas Mcmurray  Past Surgical History:    Reviewed history from 10/13/2008 and no changes required:    Hysterectomy, 1985, endometriosis, anatomy    Tonsillectomy, 1958    Mastectomy, 2004        DEXA, repeat 08/2010    Colonoscopy, 8/09, Hyperplastic Polyp, repeat 10 years  Family History:  Reviewed history from 10/13/2008 and no changes required:       Alcoholism, Drug Addiction: 0       Colon CA: 0       Ovarian/Uterine CA: Aunt - Ovarian       Breast CA: Sister, 36       Lung CA: 0       Prostate CA: others       CAD: Father, MI, 40       CVA: 0       Sudden death < 50: 0       DM: GP       Mental Illness: 0               Mother, Sarcoid  Social History:    Reviewed history from 09/28/2008 and no changes required:       Married       [employer], 36 years       Never Smoked       Alcohol use-no       Drug use-no       Regular exercise-no  Review of Systems       REVIEW  OF SYSTEMS  GEN: No systemic complaints, no fevers, chills, sweats, or other acute illnesses MSK: Detailed in the HPI GI: tolerating PO intake without difficulty Neuro: No numbness, parasthesias, or tingling associated. Otherwise the pertinent positives of the ROS are noted above. Further ROS may be demarcated in the ROS section of Centricity, If none, then this plus the HPI constitutes the ROS.    Physical Exam  General:  Well-developed,well-nourished,in no acute distress; alert,appropriate and cooperative throughout examination Head:  Normocephalic and atraumatic without obvious abnormalities. No apparent alopecia or balding. Ears:  no external deformities.   Nose:  no external deformity.   Neck:  No deformities, masses, or tenderness noted. Lungs:  Normal respiratory effort, chest expands symmetrically. Lungs are clear to auscultation, no crackles or wheezes. Heart:  Normal rate and regular rhythm. S1 and S2 normal without gallop, murmur, click, rub or other extra sounds. Abdomen:  Bowel sounds positive,abdomen soft and non-tender without masses, organomegaly or hernias noted. Msk:  Bilateral  elbow Ecchymosis or edema: neg ROM: full flexion, extension, pronation, supination Shoulder ROM: Full Flexion: 5/5 Extension: 5/5 Supination: 5/5  Pronation: 5/5 Wrist ext: 4+/5, causes pain  Wrist flexion: 5/5 No gross bony abnormality Varus and Valgus stress: stable ECRB tenderness: pos Medial epicondyle: NT Lateral epicondyle, resisted wrist extension from wrist full pronation and flexion: painful grip: 5/5 Resisted supination: painful  sensation intact Tinel's, Elbow: negative  Additional Exam:  Shoulder: L Inspection: No muscle wasting or winging Ecchymosis/edema: neg  AC joint, scapula, clavicle: TTP AC Cervical spine: NT, full ROM Spurling's: neg Abduction: full, 5/5 Flexion: full, 5/5 IR, full, lift-off: 5/5 ER at neutral: full, 5/5 AC crossover: neg Neer: pos Hawkins: pos Drop Test: neg Empty Can: pos Supraspinatus insertion: mild-mod T Bicipital groove: NT Speed's: neg Yergason's: neg Sulcus sign: neg Scapular dyskinesis: none C5-T1 intact   Impression & Recommendations:  Problem # 1:  HYPERTENSION (ICD-401.9) stable  Her updated medication list for this problem includes:    Cozaar 50 Mg Tabs (Losartan potassium) .[name] Kitchen... Take one tablet once daily    Chlorthalidone 25 Mg Tabs (Chlorthalidone) .[name] Kitchen... Take one tablet once daily  Problem # 2:  ROTATOR CUFF SYNDROME, LEFT (ICD-726.10)  c/w PT, RTC, scap stabilization  SubAC Injection, Left Verbal consent was obtained from the patient. Risks, benefits, and alternatives were explained. Patient prepped with Betadine and Ethyl Chloride used for anesthesia. The subacromial space was injected using the posterior approach. The patient tolerated the procedure well and had decreased pain post injection. No complications. Injection: 9 cc of Marcaine 0.5% and 1cc of Kenalog 40 mg. 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  Pt. given a formal rehab program from Dr. Caralee Ates at [facility] and the [facility] on  elbow rehabiliation.   Series of concentric and eccentric exercises should be done starting with no weight, work up to 1 lb, hammer, etc.  Use counterforce strap if working or using hands.  Formal PT would be beneficial. Emphasized stretching an cross-friction massage Emphasized proper palms up lifting biomechanics to unload ECRB Voltaren Gel 3-4x/day   Problem # 4:  LIVER FUNCTION TESTS, ABNORMAL, HX OF (ICD-V12.2) Assessment: New hold crestor, recheck 3 months  Complete Medication List: 1)  Cozaar 50 Mg Tabs (Losartan potassium) .... Take one tablet once daily 2)  Fluoxetine Hcl 20 Mg Tabs (Fluoxetine  hcl) .... Take one tablet once daily 3)  Crestor 40 Mg Tabs (Rosuvastatin calcium) .... Take one tablet once daily 4)  Chlorthalidone 25 Mg Tabs (Chlorthalidone) .... Take one tablet once daily 5)  Multivitamins Tabs (Multiple vitamin) .... Take one tablet once daily 6)  One-a-day Calcium Plus 500-100-50 Mg-unit-mg Chew (Calcium-magnesium-vitamin d) .... Take one tablet once daily 7)  Adult Aspirin Ec Low Strength 81 Mg Tbec (Aspirin) .... Take one tablet once daily 8)  Osteo Bi-flex Adv Double St Tabs (Misc natural products) .... Take two tablets once daily 9)  Fish Oil 1000 Mg Caps (Omega-3 fatty acids) .... Take four tablets once daily 10)  Vision Formula Tabs (Multiple vitamins-minerals) .... Take one tablet once daily 11)  Vitamin D 3000 Units  .... Take one tablet once daily 12)  Voltaren 1 % Gel (Diclofenac sodium) .... Apply to elbows 4 times daily  Patient Instructions: 1)  follow-up 6 weeks Prescriptions: VOLTAREN 1 %  GEL (DICLOFENAC SODIUM) Apply to elbows 4 times daily  #3 tubes x 5   Entered and Authorized by:   Hannah Beat MD   Signed by:   Hannah Beat MD on 12/31/2008   Method used:   Print then Give to Patient   RxID:   [id number]       Current Allergies (reviewed today): ! SULFA ! MORPHINE ! ACE INHIBITORSBasic Metabolic Panel: reviewed  last results  Na:     143 (12/28/2008 10:37:38 AM)   Cl:        102 (12/28/2008 10:37:38 AM)  K+:     3.3 (12/28/2008 10:37:38 AM)   HCO3:  34 (12/28/2008 10:37:38 AM)  BUN:  12 (12/28/2008 10:37:38 AM)  BS:      101 (12/28/2008 10:37:38 AM)  Cr:      0.6 (12/28/2008 10:37:38 AM)   FBS:      CBC: reviewed last results  WBC:    6.1 (12/28/2008 10:37:38 AM)   Hgb:    13.8 (12/28/2008 10:37:38 AM) RBC:     4.59 (12/28/2008 10:37:38 AM)   Hct:     40.1 (12/28/2008 10:37:38 AM)  Plts:      285.0 (12/28/2008 10:37:38 AM)   INR:      PT:        Liver Panel: reviewed last results  TBili:    0.7 (12/28/2008 10:37:38 AM)   DBili:    0.0 (12/28/2008 10:37:38 AM) IBili:        AlkPhos:     72 (12/28/2008 10:37:38 AM)  AST/SGOT:      55 (12/28/2008 10:37:38 AM)   ALT/SGPT:    37 (12/28/2008 10:37:38 AM)  TProtein:      7.0 (12/28/2008 10:37:38 AM)  Albumin:      3.9 (12/28/2008 10:37:38 AM)    PSA:  Lipid Panel: reviewed last results Chol:     122 (12/28/2008 10:37:38 AM)  HDL:     41.70 (12/28/2008 10:37:38 AM) LDL:      44 (12/28/2008 10:37:38 AM) LDL Direct:       TG:       184.0 (12/28/2008 10:37:38 AM) CRP:         Homocysteine:

## 2010-10-11 NOTE — Progress Notes (Signed)
Summary: refill request for losartan  Phone Note Refill Request Message from:  Patient  Refills Requested: Medication #1:  losartan This is no longer on med list.  It was taken off list in january but not mentioned in note.  Pt says she is still taking it.  Please send to [facility].  Initial call taken by: Lowella Petties CMA,  June 10, 2010 11:52 AM  Follow-up for Phone Call        Cozaar 50 mg daily, this was removed off the med list in error and should not have been.  Refill x 1 year. Follow-up by: Hannah Beat MD,  June 12, 2010 9:44 AM    New/Updated Medications: COZAAR 50 MG TABS (LOSARTAN POTASSIUM) take one tablet daily Prescriptions: COZAAR 50 MG TABS (LOSARTAN POTASSIUM) take one tablet daily  #30 x 11   Entered by:   Benny Lennert CMA (AAMA)   Authorized by:   Hannah Beat MD   Signed by:   Benny Lennert CMA (AAMA) on 06/13/2010   Method used:   Electronically to        [facility]* (retail)       [id number]       [place], [place]  [address]       Ph: [phone]       Fax: [phone]   RxID:   [id number]   Prior Medications: FLUOXETINE HCL 20 MG TABS (FLUOXETINE HCL) take one tablet once daily CHLORTHALIDONE 25 MG TABS (CHLORTHALIDONE) take one tablet once daily MULTIVITAMINS  TABS (MULTIPLE VITAMIN) take one tablet once daily ONE-A-DAY CALCIUM PLUS 500-100-50 MG-UNIT-MG CHEW (CALCIUM-MAGNESIUM-VITAMIN D) take one tablet once daily ADULT ASPIRIN EC LOW STRENGTH 81 MG TBEC (ASPIRIN) take one tablet once daily OSTEO BI-FLEX ADV DOUBLE ST  TABS (MISC NATURAL PRODUCTS) take two tablets once daily VISION FORMULA  TABS (MULTIPLE VITAMINS-MINERALS) take one tablet once daily VITAMIN D 3000 UNITS () take one tablet once daily HYCODAN SUSPENSION () 1 tsp by mouth at bedtime COZAAR 50 MG TABS (LOSARTAN POTASSIUM) take one tablet daily Current Allergies: ! SULFA ! MORPHINE ! ACE INHIBITORS

## 2010-10-11 NOTE — Assessment & Plan Note (Signed)
Summary: F/U sprain L ankle   Vital Signs:  Patient profile:   60 year old female Weight:      222 pounds BMI:     35.69 Temp:     98.2 degrees F oral Pulse rate:   70 / minute Pulse rhythm:   regular BP sitting:   120 / 80  (left arm) Cuff size:   large  Vitals Entered By: Mervin Hack CMA (December 10, 2008 11:56 AM)  History of Present Illness: CC: left ankle sprain  60 year old female presents with left ankle injury f/u  ATFL, deltoid, CFL sprain with L fibular avulsion fx treated in CAM walker and now able to WB without problems. swelling and bruising is much improved.  DOI 11/15/2008  The patient noted above presents with bilateral shoulder pain that has been ongoing for 1 year there is no history of trauma or accident. The patient denies neck pain or radicular symptoms. Denies dislocation, subluxation, separation of the shoulder. The patient does complain of significant pain in the overhead plane.  Medications Tried: none Prior shoulder Injury: No Prior surgery: No Prior fracture: No  the patient also complains of bilateral elbow pain has been ongoing for a year. She is multiple episodes of prior tennis elbow. The patient has pain when she lifts things with her hand in pronation and with suppuration. She points to her lateral epicondyles region and in the common extensor tendon region for her primary pain. She also has some radiating pain up into the lateral shoulder. She is never brought this up before.  Allergies: 1)  ! Sulfa 2)  ! Morphine 3)  ! Ace Inhibitors  Past History:  Past medical, surgical, family and social histories (including risk factors) reviewed, and no changes noted (except as noted below).  Past Medical History:    Hyperlipidemia    Hypertension    Allergic rhinitis    Breast cancer, hx of, L mastectomy, No c/r    -- DCIS    Osteoarthritis    Osteopenia    ? Hep B + by [employer]    Foot fracture, 1991    L fibular avulsion fx and  sprain, 11/2008        Former Dr. Lorenz Coaster pt.    [facility]    Breast Surgeon, Dr. Francina Ames - NOW Dr. Claud Kelp    GI: Dr. Everardo All. Barrett Shell    Derm = Arminda Resides, Dorcas Mcmurray  Past Surgical History:    Reviewed history from 10/13/2008 and no changes required:    Hysterectomy, 1985, endometriosis, anatomy    Tonsillectomy, 1958    Mastectomy, 2004        DEXA, repeat 08/2010    Colonoscopy, 8/09, Hyperplastic Polyp, repeat 10 years  Family History:    Reviewed history from 10/13/2008 and no changes required:       Alcoholism, Drug Addiction: 0       Colon CA: 0       Ovarian/Uterine CA: Aunt - Ovarian       Breast CA: Sister, 36       Lung CA: 0       Prostate CA: others       CAD: Father, MI, 40       CVA: 0       Sudden death < 50: 0       DM: GP       Mental Illness: 0  Mother, Sarcoid  Social History:    Reviewed history from 09/28/2008 and no changes required:       Married       [employer], 36 years       Never Smoked       Alcohol use-no       Drug use-no       Regular exercise-no  Review of Systems       REVIEW OF SYSTEMS  GEN: No systemic complaints, no fevers, chills, sweats, or other acute illnesses MSK: Detailed in the HPI GI: tolerating PO intake without difficulty Neuro: No numbness, parasthesias, or tingling associated. Otherwise the pertinent positives of the ROS are noted above. Further ROS may be demarcated in the ROS section of Centricity, If none, then this plus the HPI constitutes the ROS.    Physical Exam  General:  GEN: Well-developed,well-nourished,in no acute distress; alert,appropriate and cooperative throughout examination HEENT: Normocephalic and atraumatic without obvious abnormalities. No apparent alopecia or balding. Ears, externally no deformities PULM: Breathing comfortably in no respiratory distress EXT: No clubbing, cyanosis, or edema PSYCH: Normally interactive. Cooperative during the  interview. Pleasant. Friendly and conversant. Not anxious or depressed appearing. Normal, full affect.    Shoulder/Elbow Exam  Shoulder Exam:    Right:    Inspection:  Normal    Palpation:  Normal    Stability:  stable    Tenderness:  no    Swelling:  no    Erythema:  no    Range of Motion:       Flexion-Active: 180       Extension-Active: 45       Flexion-Passive: 180       Extension-Passive: 45       External Rotation : 45       Interior Rotation : T7    Left:    Inspection:  Normal    Palpation:  Normal    Stability:  stable    Tenderness:  no    Swelling:  no    Erythema:  no    painful arch of motion with abduction    Range of Motion:       Flexion-Active: 180       Extension-Active: 45       Flexion-Passive: 180       Extension-Passive: 45       External Rotation : 45       Interior Rotation : T7  Elbow Exam:    Right:    Inspection:  Normal    Palpation:  Normal    Stability:  stable    Tenderness:  right lateral epicondyle    Swelling:  no    Erythema:  no    Range of Motion:       Flexion-Active: 135       Extension-Active: 0       Flexion-Passive: 135       Extension-Passive: 0       Elbow Flexion: > 60 seconds    Left:    Inspection:  Normal    Palpation:  Normal    Stability:  stable    Tenderness:  left lateral epicondyle    Swelling:  no    Erythema:  no    Range of Motion:       Flexion-Active: 135       Extension-Active: 0       Flexion-Passive: 135       Extension-Passive: 0  Elbow Flexion: > 60 seconds  Tinel's:    empty can test + left  Impingement Sign NEER:    Right negative; Left positive Impingement Sign HAWKINS:    Right negative; Left positive Sulcus Sign:    Right negative; Left negative Yerguson:    Right negative; Left negative Speeds:    Right negative; Left positive   Foot/Ankle Exam  Inspection:    ecchymosis: minimal  Palpation:    mildly tender at ATL and deltoid  Vascular:    dorsalis pedis  and posterior tibial pulses 2+ and symmetric, capillary refill < 2 seconds, normal hair pattern, no evidence of ischemia.   Sensory:    gross sensation intact bilaterally in lower extremities.    Ankle Exam:    Right:    Inspection:  Normal    Palpation:  Normal    Stability:  stable    Tenderness:  no    Swelling:  no    Erythema:  no    Range of Motion:       Dorsiflex-Active: 60       Plantar Flex-Active: 45       Eversion-Active: 45       Inversion-Active: 75       Dorsiflex-Passive: 60       Plantar Flex-Passive: 45       Eversion-Passive: 45       Inversion-Passive: 75       Transverse Tarsal: full    Left:    Inspection:  Normal    Palpation:  Normal    Stability:  stable    Tenderness:  yes    Swelling:  yes    Erythema:  no    increased laxity with anterior drawer    Range of Motion:       Dorsiflex-Active: 60       Plantar Flex-Active: 45       Eversion-Active: 45       Inversion-Active: 75       Dorsiflex-Passive: 60       Plantar Flex-Passive: 45       Eversion-Passive: 45       Inversion-Passive: 75       Transverse Tarsal: full   Impression & Recommendations:  Problem # 1:  ANKLE SPRAIN, LEFT (ICD-845.00) Assessment Improved Improved, placed in Aircast for support over the next few weeks  advance rehab PT  Her updated medication list for this problem includes:    Adult Aspirin Ec Low Strength 81 Mg Tbec (Aspirin) .[name] Kitchen... Take one tablet once daily  Orders: Physical Therapy Referral (PT) Aircast Ankle Brace ([id number])  Problem # 2:  LATERAL EPICONDYLITIS, BILATERAL (ICD-726.32) Assessment: New Elbow anatomy was reviewed, and tendinopathy was explained.  Use counterforce strap if working or using hands - patient has at home  Formal PT would be beneficial. Emphasized stretching an cross-friction massage Emphasized proper palms up lifting biomechanics to unload ECRB  PT and ionto  Orders: Physical Therapy Referral (PT)  Problem # 3:   ROTATOR CUFF SYNDROME, LEFT (ICD-726.10) Assessment: New Shoulder anatomy was reviewed with the patient  Retraining shoulder mechanics and function was emphasized to the patient with rehab done at least 5-6 days a week.  formal PT to assist with scapular stabilization and RTC strengthening.   Orders: Physical Therapy Referral (PT)  Complete Medication List: 1)  Cozaar 50 Mg Tabs (Losartan potassium) .... Take one tablet once daily 2)  Fluoxetine Hcl 20 Mg Tabs (Fluoxetine hcl) .... Take one tablet once daily  3)  Crestor 40 Mg Tabs (Rosuvastatin calcium) .... Take one tablet once daily 4)  Chlorthalidone 25 Mg Tabs (Chlorthalidone) .... Take one tablet once daily 5)  Multivitamins Tabs (Multiple vitamin) .... Take one tablet once daily 6)  One-a-day Calcium Plus 500-100-50 Mg-unit-mg Chew (Calcium-magnesium-vitamin d) .... Take one tablet once daily 7)  Adult Aspirin Ec Low Strength 81 Mg Tbec (Aspirin) .... Take one tablet once daily 8)  Osteo Bi-flex Adv Double St Tabs (Misc natural products) .... Take two tablets once daily 9)  Fish Oil 1000 Mg Caps (Omega-3 fatty acids) .... Take four tablets once daily 10)  Vision Formula Tabs (Multiple vitamins-minerals) .... Take one tablet once daily 11)  Vitamin D 3000 Units  .... Take one tablet once daily  Patient Instructions: 1)  Alleve 2 tabs by mouth two times a day over the counter, take at least for 2 - 3 weeks. This is a prescription strength dose.  2)  Referral Appointment Information 3)  Day/Date: 4)  Time: 5)  Place/MD: 6)  Address: 7)  Phone/Fax: 8)  Patient given appointment information. Information/Orders faxed/mailed.   Current Allergies (reviewed today): ! SULFA ! MORPHINE ! ACE INHIBITORS

## 2010-10-11 NOTE — Assessment & Plan Note (Signed)
Summary: 1 week follow up /lsf   Vital Signs:  Patient profile:   60 year old female Weight:      223 pounds BMI:     35.85 Temp:     98.3 degrees F oral Pulse rate:   60 / minute Pulse rhythm:   regular BP sitting:   120 / 80  (left arm) Cuff size:   large  Vitals Entered By: Mervin Hack CMA (November 23, 2008 10:48 AM)  History of Present Illness: 60 year old female presents with left ankle injury f/u  ATFL, deltoid, CFL sprain with L fibular avulsion fx treated in CAM walker and minimally WB. Has been NWB until today  swelling and bruising is present.  Using Crutches OK.  No real need of pain meds  DOI 11/15/2008  Allergies: 1)  ! Sulfa 2)  ! Morphine 3)  ! Ace Inhibitors  Review of Systems      See HPI Neuro:  Denies numbness and tingling.  Physical Exam  General:  Well-developed,well-nourished,in no acute distress; alert,appropriate and cooperative throughout examination Head:  Normocephalic and atraumatic without obvious abnormalities. No apparent alopecia or balding. Ears:  no external deformities.   Nose:  no external deformity.     Foot/Ankle Exam  General:    obese.    Gait:    antalgic.    Inspection:    ecchymosis: laterally greatest, and in L foot  Palpation:    tenderness R-lateral malleoulus:  ATFL, CFL, deltoid  Vascular:    dorsalis pedis and posterior tibial pulses 2+ and symmetric, capillary refill < 2 seconds, normal hair pattern, no evidence of ischemia.   Sensory:    gross sensation intact bilaterally in lower extremities.    Ankle Exam:    Right:    Inspection:  Normal    Palpation:  Normal    Left:    Inspection/Palpation:  Limited ROM   Impression & Recommendations:  Problem # 1:  ANKLE SPRAIN, LEFT (ICD-845.00) Assessment Improved Reviewed films, lateral fibular avulsion fx seen on ankle films.   Advance WB as tolerated, wean out of crutches over next 10 days  Advanced rehab  f/.u 3 weeks  Her updated  medication list for this problem includes:    Adult Aspirin Ec Low Strength 81 Mg Tbec (Aspirin) .[name] Kitchen... Take one tablet once daily  Complete Medication List: 1)  Cozaar 50 Mg Tabs (Losartan potassium) .... Take one tablet once daily 2)  Fluoxetine Hcl 20 Mg Tabs (Fluoxetine hcl) .... Take one tablet once daily 3)  Crestor 40 Mg Tabs (Rosuvastatin calcium) .... Take one tablet once daily 4)  Chlorthalidone 25 Mg Tabs (Chlorthalidone) .... Take one tablet once daily 5)  Multivitamins Tabs (Multiple vitamin) .... Take one tablet once daily 6)  One-a-day Calcium Plus 500-100-50 Mg-unit-mg Chew (Calcium-magnesium-vitamin d) .... Take one tablet once daily 7)  Adult Aspirin Ec Low Strength 81 Mg Tbec (Aspirin) .... Take one tablet once daily 8)  Osteo Bi-flex Adv Double St Tabs (Misc natural products) .... Take two tablets once daily 9)  Fish Oil 1000 Mg Caps (Omega-3 fatty acids) .... Take four tablets once daily 10)  Vision Formula Tabs (Multiple vitamins-minerals) .... Take one tablet once daily 11)  Vitamin D 3000 Units  .... Take one tablet once daily  Patient Instructions: 1)  follow-up 3 weeks 2)  The medication list was reviewed and reconciled.  All changed / newly prescribed medications were explained.  A complete medication list was provided to  the patient / caregiver.      Current Allergies (reviewed today): ! SULFA ! MORPHINE ! ACE INHIBITORS

## 2010-10-13 NOTE — Assessment & Plan Note (Signed)
Summary: CPX/CLE  R/S FROM 08/30/10   Vital Signs:  Patient profile:   60 year old female Height:      66.25 inches Weight:      219.50 pounds BMI:     35.29 Temp:     98.1 degrees F oral Pulse rate:   76 / minute Pulse rhythm:   regular BP sitting:   110 / 78  (left arm) Cuff size:   large  Vitals Entered By: Benny Lennert CMA Duncan Dull) (August 31, 2010 7:59 AM)  History of Present Illness: Chief complaint cpx  s/p hysterectomy Breast CA mammogram and exam: breast center, first of next year. Breast surgeon does breast exam.  repeat DEXA  Left shoulder is out of whack and hurting on the left side and pulling, hurting. Therapy worked, helped a man, now hurting.   Skin tag: on neck  Chol, restart lipitor.   L RTC tendonitis: The patient noted above presents with shoulder pain that has been ongoing for 1-2 months there is no history of trauma or accident. The patient denies neck pain or radicular symptoms. Denies dislocation, subluxation, separation of the shoulder. The patient does complain of pain in the overhead plane. Tried PT: prior PT helped  Lipid Management History:      Positive NCEP/ATP III risk factors include female age 71 years old or older and hypertension.  Negative NCEP/ATP III risk factors include no history of early menopause without estrogen hormone replacement, non-diabetic, non-tobacco-user status, no ASHD (atherosclerotic heart disease), no prior stroke/TIA, no peripheral vascular disease, and no history of aortic aneurysm.    Preventive Screening-Counseling & Management  Alcohol-Tobacco     Alcohol drinks/day: 0     Alcohol Counseling: not indicated; use of alcohol is not excessive or problematic     Smoking Status: never  Caffeine-Diet-Exercise     Diet Counseling: not indicated; diet is assessed to be healthy     Does Patient Exercise: no  Hep-HIV-STD-Contraception     HIV Risk: no risk noted     STD Risk: no risk noted     Testicular SE  Education/Counseling to perform regular STE      Sexual History:  currently monogamous.        Drug Use:  never.    Clinical Review Panels:  Prevention   Last Mammogram:  abnormal (09/09/2008)   Last Pap Smear:  normal (09/12/2007)   Last Colonoscopy:  abnormal (05/09/2006)  Immunizations   Last Tetanus Booster:  given (08/31/2010)   Last Flu Vaccine:  given (08/31/2010)  Lipid Management   Cholesterol:  289 (07/18/2010)   LDL (bad choesterol):  44 (12/28/2008)   HDL (good cholesterol):  50.40 (07/18/2010)  Diabetes Management   Creatinine:  0.6 (07/18/2010)   Last Flu Vaccine:  given (08/31/2010)  CBC   WBC:  8.5 (07/18/2010)   RBC:  4.80 (07/18/2010)   Hgb:  14.6 (07/18/2010)   Hct:  42.6 (07/18/2010)   Platelets:  278.0 (07/18/2010)   MCV  88.8 (07/18/2010)   MCHC  34.3 (07/18/2010)   RDW  14.1 (07/18/2010)   PMN:  64.0 (07/18/2010)   Lymphs:  25.3 (07/18/2010)   Monos:  9.1 (07/18/2010)   Eosinophils:  1.6 (07/18/2010)   Basophil:  0.0 (07/18/2010)  Complete Metabolic Panel   Glucose:  113 (07/18/2010)   Sodium:  140 (07/18/2010)   Potassium:  3.9 (07/18/2010)   Chloride:  98 (07/18/2010)   CO2:  33 (07/18/2010)   BUN:  12 (07/18/2010)  Creatinine:  0.6 (07/18/2010)   Albumin:  4.2 (07/18/2010)   Total Protein:  6.9 (07/18/2010)   Calcium:  9.8 (07/18/2010)   Total Bili:  0.4 (07/18/2010)   Alk Phos:  79 (07/18/2010)   SGPT (ALT):  57 (07/18/2010)   SGOT (AST):  37 (07/18/2010)   Current Problems (verified): 1)  Health Maintenance Exam  (ICD-V70.0) 2)  Skin Tag  (ICD-701.9) 3)  Other Malaise and Fatigue  (ICD-780.79) 4)  Encounter For Long-term Use of Other Medications  (ICD-V58.69) 5)  Screening For Diabetes Mellitus  (ICD-V77.1) 6)  Rotator Cuff Syndrome, Left  (ICD-726.10) 7)  Osteoarthritis  (ICD-715.90) 8)  Breast Cancer, Hx of  (ICD-V10.3) 9)  Allergic Rhinitis  (ICD-477.9) 10)  Hypertension  (ICD-401.9) 11)  Hyperlipidemia   (ICD-272.4)  Allergies: 1)  ! Sulfa 2)  ! Morphine 3)  ! Ace Inhibitors  Past History:  Past medical, surgical, family and social histories (including risk factors) reviewed, and no changes noted (except as noted below).  Past Medical History: Reviewed history from 12/10/2008 and no changes required. 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 Hep B + by [facility] Foot fracture, 1991 L fibular avulsion fx and sprain, 11/2008  Former Dr. Lorenz Coaster pt. [facility] Breast Surgeon, Dr. Francina Ames - NOW Dr. Claud Kelp GI: Dr. Everardo All. Barrett Shell Derm = [facility], Dorcas Mcmurray  Past Surgical History: Reviewed history from 10/13/2008 and no changes required. Hysterectomy, 1985, endometriosis, anatomy Tonsillectomy, 1958 Mastectomy, 2004  DEXA, repeat 08/2010 Colonoscopy, 8/09, Hyperplastic Polyp, repeat 10 years  Family History: Reviewed history from 10/13/2008 and no changes required. Alcoholism, Drug Addiction: 0 Colon CA: 0 Ovarian/Uterine CA: Aunt - Ovarian Breast CA: Sister, 42 Lung CA: 0 Prostate CA: others CAD: Father, MI, 8 CVA: 0 Sudden death < 50: 0 DM: GP Mental Illness: 0   Mother, Sarcoid  Social History: Reviewed history from 09/28/2008 and no changes required. Married [employer], 36 years Never Smoked Alcohol use-no Drug use-no Regular exercise-no HIV Risk:  no risk noted STD Risk:  no risk noted Sexual History:  currently monogamous Drug Use:  never  Review of Systems  General: Denies fever, chills, sweats, anorexia, fatigue, weakness, malaise Eyes: Denies blurring, vision loss ENT: Denies earache, nasal congestion, nosebleeds, sore throat, and hoarseness.  Cardiovascular: Denies chest pains, palpitations, syncope, dyspnea on exertion,  Respiratory: Denies cough, dyspnea at rest, excessive sputum,wheeezing GI: Denies nausea, vomiting,  diarrhea, constipation, change in bowel habits, abdominal pain, melena, hematochezia GU: Denies dysuria, hematuria, discharge, urinary frequency, urinary hesitancy, nocturia, incontinence, genital sores, decreased libido Musculoskeletal: Denies back pain, joint pain Derm: Denies rash, itching Neuro: Denies  paresthesias, frequent falls, frequent headaches, and difficulty walking.  Psych: Denies depression, anxiety Endocrine: Denies cold intolerance, heat intolerance, polydipsia, polyphagia, polyuria, and unusual weight change.  Heme: Denies enlarged lymph nodes Allergy: No hayfever   Otherwise, the pertinent positives and negatives are listed above and in the HPI, otherwise a full review of systems has been reviewed and is negative unless noted positive.   Physical Exam  General:  Well-developed,well-nourished,in no acute distress; alert,appropriate and cooperative throughout examination Head:  Normocephalic and atraumatic without obvious abnormalities. No apparent alopecia or balding. Eyes:  No corneal or conjunctival inflammation noted. EOMI. Perrla. Funduscopic exam benign, without hemorrhages, exudates or papilledema. Vision grossly normal. Ears:  External ear exam shows no significant lesions or deformities.  Otoscopic examination reveals clear canals, tympanic membranes are  intact bilaterally without bulging, retraction, inflammation or discharge. Hearing is grossly normal bilaterally. Nose:  External nasal examination shows no deformity or inflammation. Nasal mucosa are pink and moist without lesions or exudates. Mouth:  Oral mucosa and oropharynx without lesions or exudates.  Teeth in good repair. Neck:  No deformities, masses, or tenderness noted. Chest Wall:  No deformities, masses, or tenderness noted. Lungs:  Normal respiratory effort, chest expands symmetrically. Lungs are clear to auscultation, no crackles or wheezes. Heart:  Normal rate and regular rhythm. S1 and S2 normal  without gallop, murmur, click, rub or other extra sounds. Abdomen:  Bowel sounds positive,abdomen soft and non-tender without masses, organomegaly or hernias noted. Msk:  Shoulder: L Inspection: No muscle wasting or winging Ecchymosis/edema: neg  AC joint, scapula, clavicle: NT Cervical spine: NT, full ROM Spurling's: neg Abduction: full, 5/5 Flexion: full, 5/5 IR, full, lift-off: 5/5 ER at neutral: full, 5/5 AC crossover: neg Neer: pos Hawkins: pos Drop Test: neg Empty Can: pos Supraspinatus insertion: mild-mod T Bicipital groove: NT Speed's: neg Yergason's: neg Sulcus sign: neg Scapular dyskinesis: none C5-T1 intact  Neuro: Sensation intact Grip 5/5  Extremities:  No clubbing, cyanosis, edema, or deformity noted with normal full range of motion of all joints.   Neurologic:  alert & oriented X3 and gait normal.   Skin:  Intact without suspicious lesions or rashes Cervical Nodes:  No lymphadenopathy noted Psych:  Cognition and judgment appear intact. Alert and cooperative with normal attention span and concentration. No apparent delusions, illusions, hallucinations   Impression & Recommendations:  Problem # 1:  HEALTH MAINTENANCE EXAM (ICD-V70.0) The patient's preventative maintenance and recommended screening tests for an annual wellness exam were reviewed in full today. Brought up to date unless services declined.  Counselled on the importance of diet, exercise, and its role in overall health and mortality. The patient's FH and SH was reviewed, including their home life, tobacco status, and drug and alcohol status.   Problem # 2:  HYPERLIPIDEMIA (ICD-272.4) Assessment: Deteriorated start meds  Her updated medication list for this problem includes:    Lipitor 20 Mg Tabs (Atorvastatin calcium) .[name] Kitchen... 1 by mouth at bedtime (convert to generic when goes generic)  Problem # 3:  ROTATOR CUFF SYNDROME, LEFT (ICD-726.10) Assessment: Deteriorated Shoulder anatomy was  reviewed with the patient using and anatomical model.   Rotator cuff strengthening and scapular stabilization exercises were reviewed with the patient.  A handout was given based on a shoulder program from Dr. Graciella Freer of [facility] and the [facility].  Retraining shoulder mechanics and function was emphasized to the patient with rehab done at least 5-6 days a week. f/u with me 4-6 weeks if not improving  Problem # 4:  SKIN TAG (ICD-701.9)  Verbal consent obtained.  Prepped with alcohol, iris scissors used to remove #7 skin tags from neck region. Irritating and painful to the patient.  Orders: Removal of Skin Tags up to 15 Lesions (11200)  Complete Medication List: 1)  Fluoxetine Hcl 20 Mg Tabs (Fluoxetine hcl) .... Take one tablet once daily 2)  Chlorthalidone 25 Mg Tabs (Chlorthalidone) .... Take one tablet once daily 3)  Multivitamins Tabs (Multiple vitamin) .... Take one tablet once daily 4)  One-a-day Calcium Plus 500-100-50 Mg-unit-mg Chew (Calcium-magnesium-vitamin d) .... Take one tablet once daily 5)  Adult Aspirin Ec Low Strength 81 Mg Tbec (Aspirin) .... Take one tablet once daily 6)  Osteo Bi-flex Adv Double St Tabs (Misc natural products) .... Take two tablets once  daily 7)  Vision Formula Tabs (Multiple vitamins-minerals) .... Take one tablet once daily 8)  Vitamin D 3000 Units  .... Take one tablet once daily 9)  Cozaar 50 Mg Tabs (Losartan potassium) .... Take one tablet daily 10)  Lipitor 20 Mg Tabs (Atorvastatin calcium) .[name] Kitchen.. 1 by mouth at bedtime (convert to generic when goes generic)  Other Orders: Tdap => 65yrs IM (62130) Admin 1st Vaccine (86578) Admin 1st Vaccine (46962) Flu Vaccine 43yrs + (95284)  Lipid Assessment/Plan:      Based on NCEP/ATP III, the patient's risk factor category is "2 or more risk factors and a calculated 10 year CAD risk of < 20%".  From this information, the patient's calculated lipid goals are as follows: Total cholesterol goal is  200; LDL cholesterol goal is 130; HDL cholesterol goal is 40; Triglyceride goal is 150.  Her LDL cholesterol goal has not been met.  Secondary causes for hyperlipidemia have been ruled out.  She has been counseled on adjunctive measures for lowering her cholesterol and has been provided with dietary instructions.   Prescriptions: COZAAR 50 MG TABS (LOSARTAN POTASSIUM) take one tablet daily  #90 x 3   Entered and Authorized by:   Hannah Beat MD   Signed by:   Hannah Beat MD on 08/31/2010   Method used:   Faxed to ...       [facility] (mail-order)             , [place]         Ph:        Fax: [phone]   RxID:   [id number] CHLORTHALIDONE 25 MG TABS (CHLORTHALIDONE) take one tablet once daily  #90 x 3   Entered and Authorized by:   Hannah Beat MD   Signed by:   Hannah Beat MD on 08/31/2010   Method used:   Faxed to ...       [facility] (mail-order)             , [place]         Ph:        Fax: [phone]   RxID:   [id number] FLUOXETINE HCL 20 MG TABS (FLUOXETINE HCL) take one tablet once daily  #90 x 3   Entered and Authorized by:   Hannah Beat MD   Signed by:   Hannah Beat MD on 08/31/2010   Method used:   Faxed to ...       [facility] (mail-order)             , [place]         Ph:        Fax: [phone]   RxID:   [id number] LIPITOR 20 MG TABS (ATORVASTATIN CALCIUM) 1 by mouth at bedtime (convert to generic when goes generic)  #90 x 3   Entered and Authorized by:   Hannah Beat MD   Signed by:   Hannah Beat MD on 08/31/2010   Method used:   Faxed to ...       [facility] [employer])             , [place]         Ph:        Fax: [phone]   RxID:   [id number]    Orders Added: 1)  Tdap => 72yrs IM [90715] 2)  Admin 1st Vaccine [90471] 3)  Admin 1st Vaccine [90471] 4)  Flu Vaccine 33yrs + [35009] 5)  Est. Patient 40-64 years [  99396] 6)  Est. Patient Level III [37628] 7)  Removal of Skin Tags up to 15 Lesions  [11200]   Immunizations Administered:  Tetanus Vaccine:    Vaccine Type: Tdap    Site: left deltoid    Mfr: GlaxoSmithKline    Dose: 0.5 ml    Route: IM    Given by: Benny Lennert CMA (AAMA)    Exp. Date: 06/30/2012    Lot #: [id number]    VIS given: 07/29/08 version given August 31, 2010.   Immunizations Administered:  Tetanus Vaccine:    Vaccine Type: Tdap    Site: left deltoid    Mfr: GlaxoSmithKline    Dose: 0.5 ml    Route: IM    Given by: Benny Lennert CMA (AAMA)    Exp. Date: 06/30/2012    Lot #: [id number]    VIS given: 07/29/08 version given August 31, 2010.  Current Allergies (reviewed today): ! SULFA ! MORPHINE ! ACE INHIBITORS                 Flu Vaccine Consent Questions     Do you have a history of severe allergic reactions to this vaccine? no    Any prior history of allergic reactions to egg and/or gelatin? no    Do you have a sensitivity to the preservative Thimersol? no    Do you have a past history of Guillan-Barre Syndrome? no    Do you currently have an acute febrile illness? no    Have you ever had a severe reaction to latex? no    Vaccine information given and explained to patient? yes    Are you currently pregnant? no    Lot Number:[id number]   Exp Date:03/11/2011   Site Given  Left Deltoid IMlbflu  Prevention & Chronic Care Immunizations   Influenza vaccine: given  (08/31/2010)   Influenza vaccine due: 09/01/2011    Tetanus booster: 08/31/2010: given   Tetanus booster due: 08/31/2020    Pneumococcal vaccine: Not documented  Colorectal Screening   Hemoccult: Not documented    Colonoscopy: abnormal  (05/09/2006)   Colonoscopy due: 05/09/2016  Other Screening   Pap smear: normal  (09/12/2007)   Pap smear due: Not Indicated    Mammogram: abnormal  (09/09/2008)   Mammogram action/deferral: Ordered  (10/11/2009)   Mammogram due: 09/09/2009   Smoking status: never  (08/31/2010)  Lipids   Total  Cholesterol: 289  (07/18/2010)   LDL: 44  (12/28/2008)   LDL Direct: 104.2  (07/18/2010)   HDL: 50.40  (07/18/2010)   Triglycerides: 394.0  (07/18/2010)    SGOT (AST): 37  (07/18/2010)   SGPT (ALT): 57  (07/18/2010)   Alkaline phosphatase: 79  (07/18/2010)   Total bilirubin: 0.4  (07/18/2010)  Hypertension   Last Blood Pressure: 110 / 78  (08/31/2010)   Serum creatinine: 0.6  (07/18/2010)   Serum potassium 3.9  (07/18/2010)  Self-Management Support :    Hypertension self-management support: Not documented    Lipid self-management support: Not documented    Last Flu Vaccine:  Fluvax 3+ (10/11/2009 3:05:12 PM) Flu Vaccine Result Date:  08/31/2010 Flu Vaccine Result:  given TD Result Date:  08/31/2010 TD Result:  given

## 2010-11-22 ENCOUNTER — Other Ambulatory Visit: Payer: Self-pay | Admitting: Family Medicine

## 2010-11-22 ENCOUNTER — Encounter (INDEPENDENT_AMBULATORY_CARE_PROVIDER_SITE_OTHER): Payer: Self-pay | Admitting: *Deleted

## 2010-11-22 ENCOUNTER — Other Ambulatory Visit (INDEPENDENT_AMBULATORY_CARE_PROVIDER_SITE_OTHER): Payer: 59

## 2010-11-22 DIAGNOSIS — E785 Hyperlipidemia, unspecified: Secondary | ICD-10-CM

## 2010-11-22 LAB — LIPID PANEL
Cholesterol: 186 mg/dL (ref 0–200)
HDL: 45.3 mg/dL (ref >39.00)
Total CHOL/HDL Ratio: 4
Triglycerides: 277 mg/dL — ABNORMAL HIGH (ref 0.0–149.0)
VLDL: 55.4 mg/dL — ABNORMAL HIGH (ref 0.0–40.0)

## 2010-11-22 LAB — HEPATIC FUNCTION PANEL
ALT: 44 U/L — ABNORMAL HIGH (ref 0–35)
AST: 30 U/L (ref 0–37)
Albumin: 4.3 g/dL (ref 3.5–5.2)
Alkaline Phosphatase: 78 U/L (ref 39–117)
Bilirubin, Direct: 0.1 mg/dL (ref 0.0–0.3)
Total Bilirubin: 0.5 mg/dL (ref 0.3–1.2)
Total Protein: 7.1 g/dL (ref 6.0–8.3)

## 2010-11-22 LAB — LDL CHOLESTEROL, DIRECT: Direct LDL: 62 mg/dL

## 2010-12-01 ENCOUNTER — Other Ambulatory Visit: Payer: Self-pay

## 2010-12-02 ENCOUNTER — Other Ambulatory Visit: Payer: Self-pay | Admitting: Family Medicine

## 2010-12-02 DIAGNOSIS — Z9010 Acquired absence of unspecified breast and nipple: Secondary | ICD-10-CM

## 2010-12-08 ENCOUNTER — Ambulatory Visit
Admission: RE | Admit: 2010-12-08 | Discharge: 2010-12-08 | Disposition: A | Discharge status: Home / Self Care | Payer: 59 | Source: Ambulatory Visit | Attending: Family Medicine | Admitting: Family Medicine

## 2010-12-08 DIAGNOSIS — Z9010 Acquired absence of unspecified breast and nipple: Secondary | ICD-10-CM

## 2010-12-09 ENCOUNTER — Encounter: Payer: Self-pay | Admitting: *Deleted

## 2011-01-27 NOTE — Op Note (Signed)
NAME:  [name] [name], [name] [name]                         ACCOUNT NO.:  [id number]   MEDICAL RECORD NO.:  [id number]                   PATIENT TYPE:  OIB   LOCATION:  2550                                 FACILITY:  [facility]   PHYSICIAN:  Rose Phi. Maple Hudson, M.D.                DATE OF BIRTH:  05/21/1951   DATE OF PROCEDURE:  10/28/2002  DATE OF DISCHARGE:                                 OPERATIVE REPORT   PREOPERATIVE DIAGNOSIS:  Multicentric ductal carcinoma in situ, left breast.   POSTOPERATIVE DIAGNOSIS:  Multicentric ductal carcinoma in situ, left  breast.   OPERATION:  1. Blue dye injection.  2. Left axillary sentinel lymph node biopsy.  3. Left total mastectomy.   SURGEON:  Rose Phi. Maple Hudson, M.D.   ANESTHESIA:  General.   OPERATIVE PROCEDURE:  Prior to coming to the operating room, 1 mCi of  technetium sulfur colloid was injected intradermally.   After suitable anesthesia was induced, the patient was placed in the supine  position with the left arm extended on the arm board.  Next, 5 mL of  Lymphazurin blue was injected in the subareolar tissue of the left breast.  Gentle massage for about three minutes was carried out.   We then prepped and draped the left breast, shoulder and axilla.   Careful scanning with the NeoProbe revealed only a hot spot in the left  axilla.  A short, transverse, axillary incision was made with dissection  through the subcutaneous tissue to the clavipectoral fascia.  Deep in the  axilla, just anterior to the latissimus dorsi muscle, was a blue and hot  lymph node which we excised.  There were no other palpable blue or hot  nodes.  This was submitted as a sentinel lymph node.   While that was being evaluated by the pathologist, we made transverse  elliptical incisions incorporating the nipple-areolar complex and the two  biopsy sites.  The incisions were then made, and the superior flap was then  dissected, using the cautery, to the clavicle and then medially  to the  sternum and laterally to the latissimus dorsi muscle and then inferiorly to  the rectus fascia.  We then removed the breast by dissecting from medial to  lateral until we reached the lateral border of the pectoralis major muscle.  At that time, the sentinel node was reported as negative.  We then ended the  amputation of the breast at this point, having removed all of the breast.  Hemostasis was obtained with the cautery.  We then thoroughly irrigated the  field with sterile saline.  Two 19-French Blake drains were then inserted,  one up over the anterior chest wall and the other towards the axilla in the  lateral part of the incision.  The skin of both incisions was then stapled.  Dressings were applied, and the patient was transferred to the recovery room  in satisfactory condition,  having tolerated the procedure well.                                                Rose Phi. Maple Hudson, M.D.    PRY/MEDQ  D:  10/28/2002  T:  10/28/2002  Job:  [id number]   cc:   Reuben Likes, M.D.  [address]  [place]  [place] [address]  Fax: [phone]

## 2011-06-13 ENCOUNTER — Emergency Department (HOSPITAL_COMMUNITY): Payer: 59

## 2011-06-13 ENCOUNTER — Inpatient Hospital Stay (HOSPITAL_COMMUNITY)
Admission: EM | Admit: 2011-06-13 | Discharge: 2011-06-18 | DRG: 470 | Disposition: A | Discharge status: Home Health | Payer: 59 | Attending: Orthopedic Surgery | Admitting: Orthopedic Surgery

## 2011-06-13 DIAGNOSIS — S72009A Fracture of unspecified part of neck of unspecified femur, initial encounter for closed fracture: Principal | ICD-10-CM | POA: Diagnosis present

## 2011-06-13 DIAGNOSIS — W010XXA Fall on same level from slipping, tripping and stumbling without subsequent striking against object, initial encounter: Secondary | ICD-10-CM | POA: Diagnosis present

## 2011-06-13 DIAGNOSIS — Y999 Unspecified external cause status: Secondary | ICD-10-CM

## 2011-06-13 DIAGNOSIS — E669 Obesity, unspecified: Secondary | ICD-10-CM | POA: Diagnosis present

## 2011-06-13 DIAGNOSIS — I10 Essential (primary) hypertension: Secondary | ICD-10-CM | POA: Diagnosis present

## 2011-06-13 DIAGNOSIS — Z853 Personal history of malignant neoplasm of breast: Secondary | ICD-10-CM

## 2011-06-13 DIAGNOSIS — W1809XA Striking against other object with subsequent fall, initial encounter: Secondary | ICD-10-CM | POA: Diagnosis present

## 2011-06-13 DIAGNOSIS — K5900 Constipation, unspecified: Secondary | ICD-10-CM | POA: Diagnosis not present

## 2011-06-13 DIAGNOSIS — Z79899 Other long term (current) drug therapy: Secondary | ICD-10-CM

## 2011-06-13 DIAGNOSIS — Z8619 Personal history of other infectious and parasitic diseases: Secondary | ICD-10-CM

## 2011-06-13 DIAGNOSIS — Z6834 Body mass index (BMI) 34.0-34.9, adult: Secondary | ICD-10-CM

## 2011-06-13 DIAGNOSIS — Y93K1 Activity, walking an animal: Secondary | ICD-10-CM

## 2011-06-13 DIAGNOSIS — E785 Hyperlipidemia, unspecified: Secondary | ICD-10-CM | POA: Diagnosis present

## 2011-06-13 DIAGNOSIS — D62 Acute posthemorrhagic anemia: Secondary | ICD-10-CM | POA: Diagnosis not present

## 2011-06-13 DIAGNOSIS — Y92009 Unspecified place in unspecified non-institutional (private) residence as the place of occurrence of the external cause: Secondary | ICD-10-CM

## 2011-06-13 DIAGNOSIS — E876 Hypokalemia: Secondary | ICD-10-CM | POA: Diagnosis not present

## 2011-06-13 LAB — CBC
HCT: 43.8 % (ref 36.0–46.0)
Hemoglobin: 14.9 g/dL (ref 12.0–15.0)
MCH: 29.9 pg (ref 26.0–34.0)
MCHC: 34 g/dL (ref 30.0–36.0)
MCV: 88 fL (ref 78.0–100.0)
Platelets: 295 10*3/uL (ref 150–400)
RBC: 4.98 MIL/uL (ref 3.87–5.11)
RDW: 13.5 % (ref 11.5–15.5)
WBC: 11.6 10*3/uL — ABNORMAL HIGH (ref 4.0–10.5)

## 2011-06-13 LAB — BASIC METABOLIC PANEL
BUN: 13 mg/dL (ref 6–23)
CO2: 29 mEq/L (ref 19–32)
Calcium: 9.7 mg/dL (ref 8.4–10.5)
Chloride: 96 mEq/L (ref 96–112)
Creatinine, Ser: <0.47 mg/dL — ABNORMAL LOW (ref 0.50–1.10)
Glucose, Bld: 120 mg/dL — ABNORMAL HIGH (ref 70–99)
Potassium: 3.3 mEq/L — ABNORMAL LOW (ref 3.5–5.1)
Sodium: 138 mEq/L (ref 135–145)

## 2011-06-13 LAB — URINALYSIS, ROUTINE W REFLEX MICROSCOPIC
Bilirubin Urine: NEGATIVE
Glucose, UA: NEGATIVE mg/dL
Hgb urine dipstick: NEGATIVE
Ketones, ur: NEGATIVE mg/dL
Nitrite: NEGATIVE
Protein, ur: NEGATIVE mg/dL
Specific Gravity, Urine: 1.021 (ref 1.005–1.030)
Urobilinogen, UA: 0.2 mg/dL (ref 0.0–1.0)
pH: 7 (ref 5.0–8.0)

## 2011-06-13 LAB — URINE MICROSCOPIC-ADD ON

## 2011-06-13 LAB — DIFFERENTIAL
Basophils Absolute: 0 10*3/uL (ref 0.0–0.1)
Basophils Relative: 0 % (ref 0–1)
Eosinophils Absolute: 0.1 10*3/uL (ref 0.0–0.7)
Eosinophils Relative: 1 % (ref 0–5)
Lymphocytes Relative: 19 % (ref 12–46)
Lymphs Abs: 2.2 10*3/uL (ref 0.7–4.0)
Monocytes Absolute: 0.9 10*3/uL (ref 0.1–1.0)
Monocytes Relative: 7 % (ref 3–12)
Neutro Abs: 8.4 10*3/uL — ABNORMAL HIGH (ref 1.7–7.7)
Neutrophils Relative %: 73 % (ref 43–77)

## 2011-06-13 LAB — PROTIME-INR
INR: 0.98 (ref 0.00–1.49)
Prothrombin Time: 13.2 seconds (ref 11.6–15.2)

## 2011-06-13 LAB — APTT: aPTT: 26 seconds (ref 24–37)

## 2011-06-14 ENCOUNTER — Inpatient Hospital Stay (HOSPITAL_COMMUNITY): Payer: 59

## 2011-06-14 ENCOUNTER — Other Ambulatory Visit: Payer: Self-pay | Admitting: Orthopedic Surgery

## 2011-06-14 HISTORY — PX: HIP FRACTURE SURGERY: SHX118

## 2011-06-14 LAB — BASIC METABOLIC PANEL
BUN: 9 mg/dL (ref 6–23)
CO2: 31 mEq/L (ref 19–32)
Calcium: 8.9 mg/dL (ref 8.4–10.5)
Chloride: 97 mEq/L (ref 96–112)
Creatinine, Ser: <0.47 mg/dL — ABNORMAL LOW (ref 0.50–1.10)
Glucose, Bld: 112 mg/dL — ABNORMAL HIGH (ref 70–99)
Potassium: 3.2 mEq/L — ABNORMAL LOW (ref 3.5–5.1)
Sodium: 137 mEq/L (ref 135–145)

## 2011-06-14 LAB — CBC
HCT: 40.2 % (ref 36.0–46.0)
Hemoglobin: 13.1 g/dL (ref 12.0–15.0)
MCH: 29 pg (ref 26.0–34.0)
MCHC: 32.6 g/dL (ref 30.0–36.0)
MCV: 89.1 fL (ref 78.0–100.0)
Platelets: 269 10*3/uL (ref 150–400)
RBC: 4.51 MIL/uL (ref 3.87–5.11)
RDW: 13.6 % (ref 11.5–15.5)
WBC: 12.5 10*3/uL — ABNORMAL HIGH (ref 4.0–10.5)

## 2011-06-14 LAB — ABO/RH: ABO/RH(D): B POS

## 2011-06-14 LAB — MRSA PCR SCREENING: MRSA by PCR: NEGATIVE

## 2011-06-15 LAB — BASIC METABOLIC PANEL
BUN: 7 mg/dL (ref 6–23)
CO2: 34 mEq/L — ABNORMAL HIGH (ref 19–32)
Calcium: 8.5 mg/dL (ref 8.4–10.5)
Chloride: 97 mEq/L (ref 96–112)
Creatinine, Ser: <0.47 mg/dL — ABNORMAL LOW (ref 0.50–1.10)
Glucose, Bld: 136 mg/dL — ABNORMAL HIGH (ref 70–99)
Potassium: 3.1 mEq/L — ABNORMAL LOW (ref 3.5–5.1)
Sodium: 136 mEq/L (ref 135–145)

## 2011-06-15 LAB — CBC
HCT: 31.6 % — ABNORMAL LOW (ref 36.0–46.0)
Hemoglobin: 10.1 g/dL — ABNORMAL LOW (ref 12.0–15.0)
MCH: 29.2 pg (ref 26.0–34.0)
MCHC: 32 g/dL (ref 30.0–36.0)
MCV: 91.3 fL (ref 78.0–100.0)
Platelets: 255 10*3/uL (ref 150–400)
RBC: 3.46 MIL/uL — ABNORMAL LOW (ref 3.87–5.11)
RDW: 13.9 % (ref 11.5–15.5)
WBC: 12.8 10*3/uL — ABNORMAL HIGH (ref 4.0–10.5)

## 2011-06-16 ENCOUNTER — Telehealth: Payer: Self-pay | Admitting: *Deleted

## 2011-06-16 LAB — CBC
HCT: 27.5 % — ABNORMAL LOW (ref 36.0–46.0)
Hemoglobin: 9.1 g/dL — ABNORMAL LOW (ref 12.0–15.0)
MCH: 29.7 pg (ref 26.0–34.0)
MCHC: 33.1 g/dL (ref 30.0–36.0)
MCV: 89.9 fL (ref 78.0–100.0)
Platelets: 233 10*3/uL (ref 150–400)
RBC: 3.06 MIL/uL — ABNORMAL LOW (ref 3.87–5.11)
RDW: 13.9 % (ref 11.5–15.5)
WBC: 16.2 10*3/uL — ABNORMAL HIGH (ref 4.0–10.5)

## 2011-06-16 LAB — BASIC METABOLIC PANEL
BUN: 6 mg/dL (ref 6–23)
CO2: 35 mEq/L — ABNORMAL HIGH (ref 19–32)
Calcium: 8.6 mg/dL (ref 8.4–10.5)
Chloride: 94 mEq/L — ABNORMAL LOW (ref 96–112)
Creatinine, Ser: <0.47 mg/dL — ABNORMAL LOW (ref 0.50–1.10)
Glucose, Bld: 130 mg/dL — ABNORMAL HIGH (ref 70–99)
Potassium: 3.1 mEq/L — ABNORMAL LOW (ref 3.5–5.1)
Sodium: 134 mEq/L — ABNORMAL LOW (ref 135–145)

## 2011-06-16 NOTE — Telephone Encounter (Signed)
Call if you get a chance before the end of the day.  I hope she is doing OK. Thanks for calling - I did not know until now. I wish them all the best in her recovery.

## 2011-06-16 NOTE — Telephone Encounter (Signed)
Pt's husband called to let you know that the pt fell on Tuesday and broke her hip.  She is in room 1610 at [facility], should be going home tomorrow.

## 2011-06-17 LAB — BASIC METABOLIC PANEL
BUN: 6 mg/dL (ref 6–23)
CO2: 32 mEq/L (ref 19–32)
Calcium: 8.6 mg/dL (ref 8.4–10.5)
Chloride: 96 mEq/L (ref 96–112)
Creatinine, Ser: <0.47 mg/dL — ABNORMAL LOW (ref 0.50–1.10)
Glucose, Bld: 133 mg/dL — ABNORMAL HIGH (ref 70–99)
Potassium: 4 mEq/L (ref 3.5–5.1)
Sodium: 135 mEq/L (ref 135–145)

## 2011-06-17 LAB — CBC
HCT: 23.3 % — ABNORMAL LOW (ref 36.0–46.0)
Hemoglobin: 7.7 g/dL — ABNORMAL LOW (ref 12.0–15.0)
MCH: 30.1 pg (ref 26.0–34.0)
MCHC: 33 g/dL (ref 30.0–36.0)
MCV: 91 fL (ref 78.0–100.0)
Platelets: 289 10*3/uL (ref 150–400)
RBC: 2.56 MIL/uL — ABNORMAL LOW (ref 3.87–5.11)
RDW: 13.8 % (ref 11.5–15.5)
WBC: 17.9 10*3/uL — ABNORMAL HIGH (ref 4.0–10.5)

## 2011-06-17 LAB — DIFFERENTIAL
Basophils Absolute: 0 10*3/uL (ref 0.0–0.1)
Basophils Relative: 0 % (ref 0–1)
Eosinophils Absolute: 0.1 10*3/uL (ref 0.0–0.7)
Eosinophils Relative: 0 % (ref 0–5)
Lymphocytes Relative: 11 % — ABNORMAL LOW (ref 12–46)
Lymphs Abs: 1.9 10*3/uL (ref 0.7–4.0)
Monocytes Absolute: 1.7 10*3/uL — ABNORMAL HIGH (ref 0.1–1.0)
Monocytes Relative: 9 % (ref 3–12)
Neutro Abs: 14.3 10*3/uL — ABNORMAL HIGH (ref 1.7–7.7)
Neutrophils Relative %: 80 % — ABNORMAL HIGH (ref 43–77)

## 2011-06-17 LAB — PREPARE RBC (CROSSMATCH)

## 2011-06-18 LAB — DIFFERENTIAL
Basophils Absolute: 0 10*3/uL (ref 0.0–0.1)
Basophils Relative: 0 % (ref 0–1)
Eosinophils Absolute: 0.2 10*3/uL (ref 0.0–0.7)
Eosinophils Relative: 1 % (ref 0–5)
Lymphocytes Relative: 13 % (ref 12–46)
Lymphs Abs: 2.1 10*3/uL (ref 0.7–4.0)
Monocytes Absolute: 1.3 10*3/uL — ABNORMAL HIGH (ref 0.1–1.0)
Monocytes Relative: 8 % (ref 3–12)
Neutro Abs: 11.9 10*3/uL — ABNORMAL HIGH (ref 1.7–7.7)
Neutrophils Relative %: 77 % (ref 43–77)

## 2011-06-18 LAB — URINALYSIS, ROUTINE W REFLEX MICROSCOPIC
Bilirubin Urine: NEGATIVE
Glucose, UA: NEGATIVE mg/dL
Ketones, ur: NEGATIVE mg/dL
Nitrite: POSITIVE — AB
Protein, ur: NEGATIVE mg/dL
Specific Gravity, Urine: 1.013 (ref 1.005–1.030)
Urobilinogen, UA: 1 mg/dL (ref 0.0–1.0)
pH: 7.5 (ref 5.0–8.0)

## 2011-06-18 LAB — TYPE AND SCREEN
ABO/RH(D): B POS
Antibody Screen: NEGATIVE
Unit division: 0
Unit division: 0

## 2011-06-18 LAB — BASIC METABOLIC PANEL
BUN: 9 mg/dL (ref 6–23)
CO2: 28 mEq/L (ref 19–32)
Calcium: 8.3 mg/dL — ABNORMAL LOW (ref 8.4–10.5)
Chloride: 100 mEq/L (ref 96–112)
Creatinine, Ser: <0.47 mg/dL — ABNORMAL LOW (ref 0.50–1.10)
Glucose, Bld: 116 mg/dL — ABNORMAL HIGH (ref 70–99)
Potassium: 4.4 mEq/L (ref 3.5–5.1)
Sodium: 136 mEq/L (ref 135–145)

## 2011-06-18 LAB — URINE MICROSCOPIC-ADD ON

## 2011-06-18 LAB — CBC
HCT: 27.6 % — ABNORMAL LOW (ref 36.0–46.0)
Hemoglobin: 9.2 g/dL — ABNORMAL LOW (ref 12.0–15.0)
MCH: 30.1 pg (ref 26.0–34.0)
MCHC: 33.3 g/dL (ref 30.0–36.0)
MCV: 90.2 fL (ref 78.0–100.0)
Platelets: 327 10*3/uL (ref 150–400)
RBC: 3.06 MIL/uL — ABNORMAL LOW (ref 3.87–5.11)
RDW: 14.4 % (ref 11.5–15.5)
WBC: 15.5 10*3/uL — ABNORMAL HIGH (ref 4.0–10.5)

## 2011-06-19 NOTE — Telephone Encounter (Signed)
Patient advised.

## 2011-06-21 LAB — URINE CULTURE
Colony Count: >=100000
Culture  Setup Time: 201210071805
Special Requests: NEGATIVE

## 2011-06-22 NOTE — Op Note (Signed)
[name] [name], [name] [name]               ACCOUNT NO.:  [id number]  MEDICAL RECORD NO.:  [id number]  LOCATION:  1334                         FACILITY:  [facility]  PHYSICIAN:  Lawyer Washabaugh L. Rendall, M.D.  DATE OF BIRTH:  Apr 22, 1951  DATE OF PROCEDURE:  06/13/2011 DATE OF DISCHARGE:                              OPERATIVE REPORT   PREOPERATIVE DIAGNOSIS:  Displaced right femoral neck fracture.  SURGICAL PROCEDURE:  Summit bipolar hemiarthroplasty, right hip.  POSTOPERATIVE DIAGNOSIS:  Displaced right femoral neck fracture.  SURGEON:  Sary Bogie L. Rendall, M.D.  ASSISTANT:  Legrand Pitts. Duffy, P.A. - present and participating in entire procedure.  PATHOLOGY:  The patient has what appears to be a standard femoral neck fracture.  Even though she is an 8-year cancer survivor, we did a CT scan and it showed what appeared to be normal bone with the fracture and no evidence of a lytic lesion.  We saw no evidence of tumor at the fracture site.  ANESTHESIA:  General.  DESCRIPTION OF PROCEDURE:  Under general anesthesia,the patient was placed in the left lateral decubitus position and the right hip was prepared with DuraPrep and draped as a sterile field.  She was given standard preop antibiotics.  Time-out was completed.  Routine posterior approach is made splitting the IT band in its fibers, inserting a deep Charnley retractor.  Due to morbid obesity with about a 44-inch lipid layer, bleeding on the approach was little more than average.  The short external rotators and hip capsule were opened and the fracture exposed. The superior femoral neck was then exposed.  IM initiator and canal finder were used.  Reaming up to between a size 5 and 6 was then successfully done.  The femoral neck was then osteotomized using the template to guide the saw cut.  The template recommended a lower cut than we actually made and we proceeded with the saw cut a little higher up.  However, at the time of rasping, the size 5 rasp  did not seat all the way and a size 6 was totally out of a question as it was over an inch and half proud.  We lateralized the size 5 somewhat to sink it so that it was only about an eight of an inch proud and that was considered acceptable.  At this point, the femoral head was removed from the acetabulum.  It was examined.  It appeared be a normal fracture, sent to pathology.  The acetabulum was freed of all debris and the ligamentum teres.  The femoral head was sized at a 37 and seating of 37 hip ball gave excellent suction fit and was felt to be reproducing the anatomic size.  At this point, trial of a bipolar 37 on the size 5 rasp was done excellent fit, alignment and stability were obtained.  Permanent components were then obtained and a size 5 Summit bone ingrowth stem was used along with the bipolar head for a size 37, standard neck length. With this was all put together and inserted, there was excellent fit, alignment, and stability with no evidence of tendency to sublux.  The muscle at the superior greater trochanter was reapproximated where a small  incision in the tendon was made.  The short external rotators were loosely attached proximally.  The hip capsule had only been released vertically and no T-shaped incision was made so that capsule itself was largely intact.  At this point, wound was closed in layers with #1 Tycron, 0 and 2-0 Vicryl and skin clips.  The procedure was lengthened due to the extra time spent in multilayered closure of the fat layer. She returned to Recovery in good condition.  Blood loss estimated about 300 mL.  The patient tolerated the procedure well and returned to the Recovery in good condition.     Newton Frutiger L. Priscille Kluver, M.D.     Renato Gails  D:  06/14/2011  T:  06/14/2011  Job:  [id number]  Electronically Signed by Erasmo Leventhal M.D. on 06/22/2011 09:56:29 AM

## 2011-06-27 DIAGNOSIS — I10 Essential (primary) hypertension: Secondary | ICD-10-CM

## 2011-06-27 HISTORY — DX: Essential (primary) hypertension: I10

## 2011-06-30 NOTE — Discharge Summary (Addendum)
[name] [name], [name] [name]               ACCOUNT NO.:  [id number]  MEDICAL RECORD NO.:  [id number]  LOCATION:  1610                         FACILITY:  [facility]  PHYSICIAN:  Sharese Manrique L. Rendall, M.D.  DATE OF BIRTH:  05-19-1951  DATE OF ADMISSION:  06/13/2011 DATE OF DISCHARGE:  06/18/2011                              DISCHARGE SUMMARY   ADMISSION DIAGNOSES: 1. Displaced right femoral neck fracture. 2. History of breast cancer. 3. Hypertension. 4. Hyperlipidemia. 5. Obesity. 6. Hypokalemia.  DISCHARGE DIAGNOSES: 1. Displaced right femoral neck fracture, status post right hip     hemiarthroplasty. 2. Acute blood loss anemia secondary to surgery. 3. Hypokalemia. 4. History of breast cancer. 5. Hypertension. 6. Hyperlipidemia. 7. Obesity.  SURGICAL PROCEDURE:  On June 13, 2011, [name] [name] underwent a right hip hemiarthroplasty, bipolar by Dr. Jonny Ruiz L. Rendall, assisted by Arnoldo Morale, P.A.  [name] [name] had a Summit femoral stem 12/14 taper tapered, size 5 standard placed with a self centering bipolar head 28 mm inner diameter and 47 mm outer diameter, and articulates femoral head 28 mm plus 1.5 neck length 12/14 taper.  COMPLICATIONS:  None.  CONSULTS: 1. Physical therapy consult on June 15, 2011. 2. Occupational therapy consult on June 16, 2011. 3. Case management consult on June 17, 2011.  HISTORY OF PRESENT ILLNESS:  This 60 year old white female patient was stepping up on a step on the morning of admission when [name] [name] slipped on some mud and landed on [name] [name] right hip.  [name] [name] complained of immediate pain and inability to stand.  [name] [name] was brought to the emergency department where x-rays showed [name] [name] had a displaced right femoral neck fracture.  It was suspicious for a possible lytic lesion, so a CT scan was ordered and [name] [name] was admitted for surgical fixation of the fracture.  HOSPITAL COURSE:  A CT scan was ordered that day to rule out a pathologic fracture that did show no specific  finding of an underlying lesion.  They felt this was just due to an osteoporotic fracture and not cancer.  The next morning [name] [name] was stable for surgery and underwent a right hip hemiarthroplasty by Dr. Jonny Ruiz L. Rendall.  On postop day #2, T-max was 100.1.  Vitals were stable.  Hemoglobin 10.1, hematocrit 31.6.  [name] [name] pain was well controlled.  [name] [name] was weaned off oxygen and started therapy on protocol.  On postop day #2, T-max 99.3, hemoglobin 9.1, hematocrit 27.5. Potassium was low at 3.1 that was supplemented orally.  [name] [name] did have some constipation which was treated with a laxative and plans were made for discharge home in the next couple of days.  On postop day #3, hemoglobin was 7.7, hematocrit 23.3.  [name] [name] was transfused with 2 units of packed red blood cells because [name] [name] was symptomatic and continued on therapy that day.  On postop day #4, [name] [name] remained afebrile.  Vitals were stable.  Hemoglobin had improved to 9.2 with hematocrit of 27.6.  Potassium was improved at 4.4.  It was felt [name] [name] was doing well enough for discharge to home and was discharged to home later that day.  BRIEF DISCHARGE INSTRUCTIONS:  Diet:  [name] [name] is to resume [name] [name] regular prehospitalization diet.  Medications:  Please see the patient med discharge instruction sheet for complete documentation of the meds, but new meds that we placed [name] [name] on were Robaxin, Percocet, and Xarelto.  Activity:  [name] [name] is to be weightbearing as tolerated on the right leg with use of a walker.  No lifting or driving for 6 weeks.  Please see the white total joint discharge sheet for further activity instructions.  Wound care:  Please see the white total joint discharge sheet for further wound care instructions.  Follow up:  [name] [name] is to follow up with Dr. Priscille Kluver in our office on Tuesday, on October 16, and [name] [name] needs to call [phone] for that appointment.  It appears [name] [name] was placed on antibiotics prior to discharge for possible UTI without  knowing the results of the UA and CNS.  That prescription was left on the chart and does not appear to be on the med discharge sheet.  LABORATORY DATA:  Hemoglobin and hematocrit ranged from 14.9 and 43.8 on the 2nd; to 7.7 and 23.3 on the 6th; to 9.2 and 27.6 on the 7th.  White count went from 11.6 on the 2nd; to 17.9 on the 6th; to 15.5 on the 7th. Platelets were within normal limits.  Potassium went from 3.3 on the 2nd; to 3.1 on the 5th; to 4.1 on the 6th.  Glucose ranged from 120 on the 2nd; to 133 on the 6th.  Chloride went from 96 on the 2nd; to 94 on the 5th; and 96 on the 6th.  Sodium dropped to a low of 134 on the 5th, but then was within normal limits. Calcium dropped to a low of 8.3 on October 7.  Urinalysis on October 2nd showed small leukocyte esterase, rare epithelials, 11-20 white cells, and few bacteria.  On October 7, UA showed trace hemoglobin, positive nitrite, moderate leukocyte esterase, rare epithelials, 11-20 white cells, and 0-2 red cells, many bacteria, a grew greater than 100,000 colonies/mL of E. coli and Proteus mirabilis.     Legrand Pitts Duffy, P.A.   ______________________________ Carlisle Beers. Priscille Kluver, M.D.    KED/MEDQ  D:  06/27/2011  T:  06/27/2011  Job:  [id number]  Electronically Signed by Otilio Jefferson. on 06/29/2011 09:50:32 AM Electronically Signed by Erasmo Leventhal M.D. on 06/30/2011 08:21:30 AM Electronically Signed by Erasmo Leventhal M.D. on 06/30/2011 08:21:28 AM

## 2011-07-10 ENCOUNTER — Encounter: Payer: Self-pay | Admitting: Family Medicine

## 2011-07-10 ENCOUNTER — Ambulatory Visit (INDEPENDENT_AMBULATORY_CARE_PROVIDER_SITE_OTHER): Payer: 59 | Admitting: Family Medicine

## 2011-07-10 VITALS — BP 116/80 | HR 80 | Temp 98.1°F | Wt 210.1 lb

## 2011-07-10 DIAGNOSIS — Z23 Encounter for immunization: Secondary | ICD-10-CM

## 2011-07-10 DIAGNOSIS — R42 Dizziness and giddiness: Secondary | ICD-10-CM

## 2011-07-10 LAB — CBC WITH DIFFERENTIAL/PLATELET
Basophils Absolute: 0 10*3/uL (ref 0.0–0.1)
Basophils Relative: 0.3 % (ref 0.0–3.0)
Eosinophils Absolute: 0.1 10*3/uL (ref 0.0–0.7)
Eosinophils Relative: 1.6 % (ref 0.0–5.0)
HCT: 42.4 % (ref 36.0–46.0)
Hemoglobin: 14 g/dL (ref 12.0–15.0)
Lymphocytes Relative: 26.8 % (ref 12.0–46.0)
Lymphs Abs: 2.3 10*3/uL (ref 0.7–4.0)
MCHC: 33.2 g/dL (ref 30.0–36.0)
MCV: 90.3 fl (ref 78.0–100.0)
Monocytes Absolute: 0.7 10*3/uL (ref 0.1–1.0)
Monocytes Relative: 8.6 % (ref 3.0–12.0)
Neutro Abs: 5.5 10*3/uL (ref 1.4–7.7)
Neutrophils Relative %: 62.7 % (ref 43.0–77.0)
Platelets: 395 10*3/uL (ref 150.0–400.0)
RBC: 4.69 Mil/uL (ref 3.87–5.11)
RDW: 14.4 % (ref 11.5–14.6)
WBC: 8.7 10*3/uL (ref 4.5–10.5)

## 2011-07-10 LAB — BASIC METABOLIC PANEL
BUN: 15 mg/dL (ref 6–23)
CO2: 31 mEq/L (ref 19–32)
Calcium: 9.9 mg/dL (ref 8.4–10.5)
Chloride: 100 mEq/L (ref 96–112)
Creatinine, Ser: 0.8 mg/dL (ref 0.4–1.2)
GFR: 82.5 mL/min (ref >60.00)
Glucose, Bld: 116 mg/dL — ABNORMAL HIGH (ref 70–99)
Potassium: 3.8 mEq/L (ref 3.5–5.1)
Sodium: 141 mEq/L (ref 135–145)

## 2011-07-10 LAB — TSH: TSH: 2.5 u[IU]/mL (ref 0.35–5.50)

## 2011-07-10 MED ORDER — MECLIZINE HCL 12.5 MG PO TABS: 12.5000 mg | ORAL_TABLET | Freq: Three times a day (TID) | ORAL | Status: DC | PRN

## 2011-07-10 NOTE — Progress Notes (Signed)
  Subjective:    Patient ID: [name] [name], female    DOB: 1950/10/26, 60 y.o.   MRN: [id number]  HPI CC: dizzy  Started 5 days ago, can walk small amount, stays woozy.  Sudden movements make dizziness worse.  Nauseated when on feet or walking too much.  Nausea resolves with sitting back down.  Mild tinnitus.  Constant dizziness, worsened with movement.  Dizziness described as lightheaded, woozy.  + room spinning worse with laying flat and turning head.  Thinks turning head to left makes dizziness worse.  H/o similar event several years ago, told equilibrium was off (Dr. Lorenz Coaster).  No headaches, vision changes, CP/tightness, SOB, coughing, recent viral illness, ear pain, hearing changes.  No vomiting.  No sinus pressure, congestion.  No presyncope or LOC.  Unable to lay down on exam table for orthostatics or further testing (dix hallpike) 2/2 recent hip surgery.  Able to get on exam table with assistance to do dix hallpike  Only on asa 81mg , was on another oral medicine (blood thinner) prior to aspirin after surgery but doesn't know what for.  Review of Systems Per HPI    Objective:   Physical Exam  Nursing note and vitals reviewed. Constitutional: She is oriented to person, place, and time. She appears well-developed and well-nourished. No distress.  HENT:  Head: Normocephalic and atraumatic.  Mouth/Throat: Oropharynx is clear and moist. No oropharyngeal exudate.  Eyes: Conjunctivae and EOM are normal. Pupils are equal, round, and reactive to light. No scleral icterus.  Neck: Normal range of motion. Neck supple.  Cardiovascular: Normal rate, regular rhythm, normal heart sounds and intact distal pulses.   No murmur heard. Pulmonary/Chest: Effort normal and breath sounds normal. No respiratory distress. She has no wheezes. She has no rales.  Lymphadenopathy:    She has no cervical adenopathy.  Neurological: She is alert and oriented to person, place, and time. No cranial nerve  deficit or sensory deficit. Coordination normal.       Normal finger to nose. dix hallpike neg on right, mildly positive on right (wall spinning but no nystagmus). Mild nystagmus, reduces over time.  Skin: Skin is warm and dry. No rash noted.  Psychiatric: She has a normal mood and affect.       Assessment & Plan:

## 2011-07-10 NOTE — Progress Notes (Signed)
Addended by: Josph Macho A on: 07/10/2011 10:57 AM   Modules accepted: Orders

## 2011-07-10 NOTE — Assessment & Plan Note (Addendum)
Some nonspecific "lightheadedness", some vertigo description. dix hallpike mildly positive on left (reproduces vertigo, not nystagmus). Will treat with meclizine, sent home with epley canal repositioning maneuver. Advised if worsening, to return for further evaluation. In setting of recent hip surgery, check cbc and other blood work. No syncope/presyncope.  Doubt cardiac.

## 2011-07-10 NOTE — Patient Instructions (Signed)
This may be BPPV (see below), although some atypical features.  Treat with meclizine - caution as it can make you sleepy so don't take when you are preparing to do activity. Blood work to check on other causes of dizziness. Please return if not improving as expected.  Benign Positional Vertigo Vertigo is a feeling that you are unsteady or that you or your surroundings are moving. Benign positional vertigo (BPV) is the most common form of vertigo. Benign means it does not have a serious cause. BPV is an upset in the balance system in your middle ear. This is troublesome but usually not serious. A viral infection or head injury are common causes, but often no cause is found. It is more common as we grow older. SYMPTOMS  Sudden dizziness happens when you move your head in different directions. Some of the problems that come with this are:  Loss of balance.   Throwing up (vomiting).   Blurred vision.   Dizziness .   Feeling sick to your stomach (nauseated).  DIAGNOSIS  Your caregiver may do some specialized testing to prove what is wrong. HOME CARE INSTRUCTIONS   Rest and eat a well-balanced diet.   Move slowly and do not make sudden body or head movements.   Do not drive a car or do any activities that could hurt you or others.   Lie down and rest. Take precautions to prevent falls.  SEEK IMMEDIATE MEDICAL CARE IF:   You develop headaches which are severe or lasting.   You develop continued vomiting.   A temporary loss or change of vision appears.   You notice temporary numbness on one side of your body.   You are temporarily unable to speak.   Temporary areas of weakness develop.   You have weakness or numbness in the face, arms, or legs.    You experience slurred speech or difficulty swallowing.  MAKE SURE YOU:   Understand these instructions.   Will watch your condition.   Will get help right away if you are not doing well or get worse.  Document Released:  06/05/2006 Document Revised: 03/13/2011 Document Reviewed: 06/14/2006 [facility] Patient Information 2012 [place], [place].

## 2011-07-13 ENCOUNTER — Telehealth: Payer: Self-pay

## 2011-07-13 NOTE — Telephone Encounter (Signed)
Yes, please continue it.

## 2011-07-13 NOTE — Telephone Encounter (Signed)
[name] [name] with [facility] called to verify with Dr Patsy Lager  OK to extend PT two times a week for 4 more weeks diagnosis BPPV and to continue with her hip therapy. [name] [name] can be reached [phone].

## 2011-07-13 NOTE — Telephone Encounter (Signed)
[name] [name] advised as instructed

## 2011-07-26 ENCOUNTER — Other Ambulatory Visit: Payer: Self-pay | Admitting: Family Medicine

## 2011-08-04 ENCOUNTER — Other Ambulatory Visit: Payer: Self-pay | Admitting: Family Medicine

## 2011-10-24 ENCOUNTER — Encounter: Payer: Self-pay | Admitting: Family Medicine

## 2011-10-24 ENCOUNTER — Ambulatory Visit (INDEPENDENT_AMBULATORY_CARE_PROVIDER_SITE_OTHER): Payer: 59 | Admitting: Family Medicine

## 2011-10-24 VITALS — BP 124/80 | HR 85 | Temp 98.4°F | Wt 216.5 lb

## 2011-10-24 DIAGNOSIS — J069 Acute upper respiratory infection, unspecified: Secondary | ICD-10-CM

## 2011-10-24 MED ORDER — AZITHROMYCIN 250 MG PO TABS: ORAL_TABLET | ORAL | Status: AC

## 2011-10-24 NOTE — Patient Instructions (Signed)
Take Zpack as directed.  Drink lots of fluids.  Treat sympotmatically with Mucinex, nasal saline irrigation, and Tylenol/Ibuprofen.  Call if not improving as expected in 5-7 days.    

## 2011-10-24 NOTE — Progress Notes (Signed)
SUBJECTIVE:  [name] [name] is a 61 y.o. female who complains of coryza, congestion and dry cough for 7 days. She denies a history of anorexia, chest pain, vomiting and weakness and denies a history of asthma. Patient denies smoke cigarettes.   Patient Active Problem List  Diagnoses  . HYPERLIPIDEMIA  . HYPERTENSION  . ALLERGIC RHINITIS  . OSTEOARTHRITIS  . ROTATOR CUFF SYNDROME, LEFT  . OTHER MALAISE AND FATIGUE  . BREAST CANCER, HX OF  . SKIN TAG  . Dizziness   Past Medical History  Diagnosis Date  . HLD (hyperlipidemia)   . HTN (hypertension)   . Allergic rhinitis   . Breast cancer     history of  . Osteoarthritis   . Osteopenia   . History of hepatitis B     ?--tested + by [employer]  . Avulsion fracture of distal fibula 3/10    and sprain; Left   Past Surgical History  Procedure Date  . Abdominal hysterectomy 1985    endometriosis; anatomy  . Tonsillectomy 1958  . Mastectomy 2004  . Colonoscopy 8/09    Hyperplastic polyp; repeat in 10 years  . Hip fracture surgery 06/14/2011    ball replaced (Dr. Marciano Sequin)   History  Substance Use Topics  . Smoking status: Never Smoker   . Smokeless tobacco: Not on file  . Alcohol Use: No   Family History  Problem Relation Age of Onset  . Ovarian cancer      Aunt  . Breast cancer Sister 76  . Heart attack Father 108  . Diabetes      GP  . Sarcoidosis Mother    Allergies  Allergen Reactions  . Ace Inhibitors     REACTION: Cough  . Codeine     Hallucinations  . Morphine   . Sulfonamide Derivatives    Current Outpatient Prescriptions on File Prior to Visit  Medication Sig Dispense Refill  . aspirin 81 MG tablet Take 81 mg by mouth daily.        [name] Kitchen atorvastatin (LIPITOR) 20 MG tablet TAKE 1 TABLET AT BEDTIME  90 tablet  2  . Calcium-Magnesium-Vitamin D 500-50-100 MG-MG-UNIT CHEW Chew 1 tablet by mouth daily.        . chlorthalidone (HYGROTON) 25 MG tablet TAKE 1 TABLET DAILY  90 tablet  2  . Cholecalciferol (VITAMIN  D3) 3000 UNITS TABS Take 1 tablet by mouth daily.        . Cyclobenzaprine HCl (FLEXERIL PO) Take by mouth as directed.        [name] Kitchen FLUoxetine (PROZAC) 20 MG tablet TAKE 1 TABLET DAILY  90 tablet  2  . losartan (COZAAR) 50 MG tablet TAKE 1 TABLET DAILY  90 tablet  2  . meclizine (ANTIVERT) 12.5 MG tablet Take 1 tablet (12.5 mg total) by mouth 3 (three) times daily as needed for dizziness or nausea.  30 tablet  1  . Misc Natural Products (OSTEO BI-FLEX ADV DOUBLE ST) TABS Take 2 tablets by mouth daily.        . Multiple Vitamin (MULTIVITAMIN) tablet Take 1 tablet by mouth daily.        . Multiple Vitamins-Minerals (VISION FORMULA) TABS Take 1 tablet by mouth daily.        . Oxycodone-Acetaminophen (PERCOCET PO) Take by mouth as directed.         The PMH, PSH, Social History, Family History, Medications, and allergies have been reviewed in [facility], and have been updated if relevant.  OBJECTIVE: BP 124/80  Pulse 85  Temp(Src) 98.4 F (36.9 C) (Oral)  Wt 216 lb 8 oz (98.204 kg)  She appears well, vital signs are as noted. Ears normal.  Throat and pharynx normal.  Neck supple. No adenopathy in the neck. Nose is congested. Sinuses non tender. Pos wheeze LLB  ASSESSMENT:  sinusitis and bronchitis  PLAN: Given duration and progression of symptoms, will treat for bacterial process with Zpack. Symptomatic therapy suggested: push fluids, rest and return office visit prn if symptoms persist or worsen.  Call or return to clinic prn if these symptoms worsen or fail to improve as anticipated.

## 2011-11-16 ENCOUNTER — Ambulatory Visit (INDEPENDENT_AMBULATORY_CARE_PROVIDER_SITE_OTHER): Payer: 59 | Admitting: Family Medicine

## 2011-11-16 ENCOUNTER — Ambulatory Visit (INDEPENDENT_AMBULATORY_CARE_PROVIDER_SITE_OTHER)
Admission: RE | Admit: 2011-11-16 | Discharge: 2011-11-16 | Disposition: A | Discharge status: Home / Self Care | Payer: 59 | Source: Ambulatory Visit | Attending: Family Medicine | Admitting: Family Medicine

## 2011-11-16 ENCOUNTER — Encounter: Payer: Self-pay | Admitting: Family Medicine

## 2011-11-16 VITALS — BP 130/78 | HR 96 | Temp 98.2°F | Ht 65.0 in | Wt 217.4 lb

## 2011-11-16 DIAGNOSIS — M7540 Impingement syndrome of unspecified shoulder: Secondary | ICD-10-CM

## 2011-11-16 DIAGNOSIS — M7711 Lateral epicondylitis, right elbow: Secondary | ICD-10-CM

## 2011-11-16 DIAGNOSIS — M67919 Unspecified disorder of synovium and tendon, unspecified shoulder: Secondary | ICD-10-CM

## 2011-11-16 DIAGNOSIS — M7710 Lateral epicondylitis, unspecified elbow: Secondary | ICD-10-CM

## 2011-11-16 NOTE — Patient Instructions (Signed)
Patient presents with lateral elbow pain.  R LE L RTC tendinopathy  Length of symptoms: Hand effected:  Patient describes a dull ache on the lateral elbow. There is some translation in the proximal forearm and in the distal upper arm. It is painful to lift with the hand facing down and to lift with the thumb in an upright position. Supination is painful. Patient points to the lateral epicondyle as the point of maximal tenderness near ECRB.  No trauma.   No prior fractures or operative interventions in the effective hand. Prior PT or HEP: none  Denies numbness or tingling. No significant neck or shoulder pain.  Hand of dominance:  REVIEW OF SYSTEMS  GEN: No fevers, chills. Nontoxic. Primarily MSK c/o today. MSK: Detailed in the HPI GI: tolerating PO intake without difficulty Neuro: No numbness, parasthesias, or tingling associated. Otherwise the pertinent positives of the ROS are noted above.   PHYSICAL EXAM  Blood pressure 130/78, pulse 96, temperature 98.2 F (36.8 C), temperature source Oral, height 5\' 5"  (1.651 m), weight 217 lb 6.4 oz (98.612 kg), SpO2 98.00%.  GEN: Well-developed,well-nourished,in no acute distress; alert,appropriate and cooperative throughout examination HEENT: Normocephalic and atraumatic without obvious abnormalities. Ears, externally no deformities PULM: Breathing comfortably in no respiratory distress EXT: No clubbing, cyanosis, or edema PSYCH: Normally interactive. Cooperative during the interview. Pleasant. Friendly and conversant. Not anxious or depressed appearing. Normal, full affect.  []  elbow Ecchymosis or edema: neg ROM: full flexion, extension, pronation, supination Shoulder ROM: Full Flexion: 5/5 Extension: 5/5, PAINFUL Supination: 5/5, PAINFUL Pronation: 5/5 Wrist ext: 5/5 Wrist flexion: 5/5 No gross bony abnormality Varus and Valgus stress: stable ECRB tenderness: YES, TTP Medial epicondyle: NT Lateral epicondyle, resisted  wrist extension from wrist full pronation and flexion: PAINFUL grip: 5/5  sensation intact Tinel's, Elbow: negative  A/P: Lateral Epicondylitis: Elbow anatomy was reviewed, and tendinopathy was explained.  Pt. given a formal rehab program from Dr. Caralee Ates at [facility] and the [facility] on elbow rehabiliation.  Series of concentric and eccentric exercises should be done starting with no weight, work up to 1 lb, hammer, etc.  Use counterforce strap if working or using hands.  Formal PT would be beneficial. Emphasized stretching an cross-friction massage Emphasized proper palms up lifting biomechanics to unload ECRB

## 2011-11-16 NOTE — Progress Notes (Signed)
Patient Name: [name] [name] Date of Birth: 08-21-1951 Age: 61 y.o. Medical Record Number: [id number] Gender: female Date of Encounter: 11/16/2011  History of Present Illness:  [name] [name] is a 61 y.o. very pleasant female patient who presents with the following:  Coughing a little  L RTC tendinopathy: Left-sided shoulder pain off and on for for 6 months. Pain with abduction. Pain laterally. She is able to fully abduct and flex her shoulder. Terminal internal range of motion does hurt somewhat. No loss of motion. No dramatic dislocation.  R LE - pretty bad. Patient presents with lateral elbow pain.  Length of symptoms: 5 mo Hand effected: R  [name] [name] began when she fell and had a traumatic femoral neck fracture and ultimately had operative fixation of that fracture.  Patient describes a dull ache on the lateral elbow. There is some translation in the proximal forearm and in the distal upper arm. It is painful to lift with the hand facing down and to lift with the thumb in an upright position. Supination is painful. Patient points to the lateral epicondyle as the point of maximal tenderness near ECRB.  Denies numbness or tingling. No significant neck or shoulder pain.  Hand of dominance: R   RTC, 06/22/11 - outpatient therapy for   Past Medical History, Surgical History, Social History, Family History, Problem List, Medications, and Allergies have been reviewed and updated if relevant.  Review of Systems:  GEN: No fevers, chills. Nontoxic. Primarily MSK c/o today. MSK: Detailed in the HPI GI: tolerating PO intake without difficulty Neuro: No numbness, parasthesias, or tingling associated. Otherwise the pertinent positives of the ROS are noted above.    Physical Examination: Filed Vitals:   11/16/11 0943  BP: 130/78  Pulse: 96  Temp: 98.2 F (36.8 C)  TempSrc: Oral  Height: 5\' 5"  (1.651 m)  Weight: 217 lb 6.4 oz (98.612 kg)  SpO2: 98%    Body mass index is 36.18  kg/(m^2).   GEN: WDWN, NAD, Non-toxic, Alert & Oriented x 3 HEENT: Atraumatic, Normocephalic.  Ears and Nose: No external deformity. EXTR: No clubbing/cyanosis/edema NEURO: Normal gait.  PSYCH: Normally interactive. Conversant. Not depressed or anxious appearing.  Calm demeanor.   Elbow: R Ecchymosis or edema: neg ROM: full flexion, extension, pronation, supination Shoulder ROM: Full Flexion: 5/5 Extension: 5/5, PAINFUL Supination: 5/5, PAINFUL Pronation: 5/5 Wrist ext: 5/5 Wrist flexion: 5/5 No gross bony abnormality Varus and Valgus stress: stable ECRB tenderness: YES, TTP Medial epicondyle: NT Lateral epicondyle, resisted wrist extension from wrist full pronation and flexion: PAINFUL grip: 5/5  Last lower: Full range of motion. Strength is 4+/5 in abduction. External and internal rotation is 5/5. Positive Neer test. Leanord Asal test is positive. Negative crossover test.  sensation intact Tinel's, Elbow: negative    Assessment and Plan: 1. Rotator cuff impingement syndrome  Ambulatory referral to Physical Therapy, DG Shoulder Left  2. Lateral epicondylitis of right elbow  Ambulatory referral to Physical Therapy, DG Elbow Complete Right    Severe lateral epicondylitis of the right elbow. This may be posttraumatic. Then she developed a secondary tendinopathy. Radial tunnel syndrome cannot be excluded. Formal physical therapy, ATYMM technique  Rotator Cuff Tendinopathy Retraining shoulder mechanics and function was emphasized to the patient with rehab done at least 5-6 days a week.  The patient could benefit from formal PT to assist with scapular stabilization and RTC strengthening.   Lateral Epicondylitis Injection, R Verbal consent was obtained from the patient. Risks, benefits, and alternatives were  discussed. Potential complications including loss of pigment, atrophy, and rare risk of infection were discussed. Prepped with Chloraprep and Ethyl Chloride used for  anesthesia. Under sterile conditions, the patient was injected at the point of maximal tenderness at the ECRB tendon with 1 cc of Lidocaine 1% and 1 cc of Depo-Medrol 40 mg. Decreased pain after injection. No complications.  Needle size: 22 gauge 1 1/2 inch

## 2011-12-28 ENCOUNTER — Ambulatory Visit: Payer: 59 | Admitting: Family Medicine

## 2012-01-03 ENCOUNTER — Ambulatory Visit: Payer: 59 | Admitting: Family Medicine

## 2012-01-05 ENCOUNTER — Encounter: Payer: Self-pay | Admitting: Family Medicine

## 2012-01-05 ENCOUNTER — Ambulatory Visit (INDEPENDENT_AMBULATORY_CARE_PROVIDER_SITE_OTHER): Payer: 59 | Admitting: Family Medicine

## 2012-01-05 VITALS — BP 120/72 | HR 89 | Temp 98.0°F | Ht 65.0 in | Wt 216.8 lb

## 2012-01-05 DIAGNOSIS — M67919 Unspecified disorder of synovium and tendon, unspecified shoulder: Secondary | ICD-10-CM

## 2012-01-05 DIAGNOSIS — M7710 Lateral epicondylitis, unspecified elbow: Secondary | ICD-10-CM

## 2012-01-05 NOTE — Progress Notes (Signed)
  Patient Name: [name] [name] Date of Birth: [birth year] Age: 61 y.o. Medical Record Number: [id number] Gender: female Date of Encounter: 01/05/2012  History of Present Illness:  [name] [name] is a 61 y.o. very pleasant female patient who presents with the following:  L RTC  LE  Lateral epicondyle -itis is doing dramatically better. Essentially she is asymptomatic now.  LEFT rotator cuff tendinopathy is much better. She is much more asymptomatic. Status post subacromial injection last office visit, she is been compliant with her physical therapy doing this approximately 3-4 times of home rehabilitation weekly  Past Medical History, Surgical History, Social History, Family History, Problem List, Medications, and Allergies have been reviewed and updated if relevant.  Review of Systems:  GEN: No fevers, chills. Nontoxic. Primarily MSK c/o today. MSK: Detailed in the HPI GI: tolerating PO intake without difficulty Neuro: No numbness, parasthesias, or tingling associated. Otherwise the pertinent positives of the ROS are noted above.    Physical Examination: Filed Vitals:   01/05/12 1504  BP: 120/72  Pulse: 89  Temp: 98 F (36.7 C)  TempSrc: Oral  Height: 5\' 5"  (1.651 m)  Weight: 216 lb 12.8 oz (98.34 kg)  SpO2: 96%    Body mass index is 36.08 kg/(m^2).   GEN: WDWN, NAD, Non-toxic, Alert & Oriented x 3 HEENT: Atraumatic, Normocephalic.  Ears and Nose: No external deformity. EXTR: No clubbing/cyanosis/edema NEURO: Normal gait.  PSYCH: Normally interactive. Conversant. Not depressed or anxious appearing.  Calm demeanor.   Shoulder: L Inspection: No muscle wasting or winging Ecchymosis/edema: neg  AC joint, scapula, clavicle: NT Cervical spine: NT, full ROM Spurling's: neg Abduction: full, 5/5 Flexion: full, 5/5 IR, full, lift-off: 5/5 ER at neutral: full, 5/5 AC crossover and compression: pos Neer: neg Hawkins: neg Drop Test: neg Empty Can:  neg Supraspinatus insertion: NT Bicipital groove: NT Speed's: neg Yergason's: neg Sulcus sign: neg Scapular dyskinesis: none C5-T1 intact Sensation intact Grip 5/5   Assessment and Plan: 1. Tendinopathy of rotator cuff   2. Lateral epicondylitis     Much improved, she is to followup in about 6 months.

## 2012-01-05 NOTE — Patient Instructions (Signed)
F/u 5-6 months

## 2012-01-11 ENCOUNTER — Ambulatory Visit (INDEPENDENT_AMBULATORY_CARE_PROVIDER_SITE_OTHER): Payer: Self-pay | Admitting: General Surgery

## 2012-01-16 ENCOUNTER — Other Ambulatory Visit (INDEPENDENT_AMBULATORY_CARE_PROVIDER_SITE_OTHER): Payer: Self-pay | Admitting: General Surgery

## 2012-01-16 DIAGNOSIS — Z853 Personal history of malignant neoplasm of breast: Secondary | ICD-10-CM

## 2012-01-17 ENCOUNTER — Ambulatory Visit
Admission: RE | Admit: 2012-01-17 | Discharge: 2012-01-17 | Disposition: A | Discharge status: Home / Self Care | Payer: 59 | Source: Ambulatory Visit | Attending: General Surgery | Admitting: General Surgery

## 2012-01-17 DIAGNOSIS — Z853 Personal history of malignant neoplasm of breast: Secondary | ICD-10-CM

## 2012-01-25 ENCOUNTER — Encounter (INDEPENDENT_AMBULATORY_CARE_PROVIDER_SITE_OTHER): Payer: Self-pay | Admitting: General Surgery

## 2012-01-25 ENCOUNTER — Ambulatory Visit (INDEPENDENT_AMBULATORY_CARE_PROVIDER_SITE_OTHER): Payer: 59 | Admitting: General Surgery

## 2012-01-25 VITALS — BP 150/96 | HR 88 | Temp 97.0°F | Ht 65.0 in | Wt 216.0 lb

## 2012-01-25 DIAGNOSIS — Z853 Personal history of malignant neoplasm of breast: Secondary | ICD-10-CM

## 2012-01-25 NOTE — Patient Instructions (Signed)
Examination of your left mastectomy site and your right breast are normal. There is no evidence of cancer. Your recent mammogram of the right breast is also normal.  Return to see Dr. Derrell Lolling in one year, just after you get your annual mammograms.

## 2012-01-25 NOTE — Progress Notes (Signed)
Patient ID: [name] [name], female   DOB: Aug 30, 1951, 61 y.o.   MRN: [id number]  Chief Complaint  Patient presents with  . Breast Cancer Long Term Follow Up    yrly br ck    HPI [name] [name] is a 61 y.o. female.  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  This patient underwent left total mastectomy and sentinel node biopsy Dr. Francina Ames on October 23, 2002. She had DCIS, pathologic stage Tis, N0. Family history is positive for premenopausal breast cancer in her sister and a cousin. BRCA testing was negative.  She is not being followed by a medical oncologist.  She has no complaints about her left mastectomy site or her right breast. Right breast mammogram was performed on Jan 16, 2012 and is category one, normal.  In Oct.m, 2012 she fell and broke her hip that has been repaired and she has done well. She continues to followup with her primary care physician, Dr. Kerin Perna at [facility]. HPI  Past Medical History  Diagnosis Date  . HLD (hyperlipidemia)   . HTN (hypertension)   . Allergic rhinitis   . Breast cancer     history of  . Osteoarthritis   . Osteopenia   . History of hepatitis B     ?--tested + by [employer]  . Avulsion fracture of distal fibula 3/10    and sprain; Left    Past Surgical History  Procedure Date  . Tonsillectomy 1958  . Mastectomy 2004  . Colonoscopy 8/09    Hyperplastic polyp; repeat in 10 years  . Hip fracture surgery 06/14/2011    ball replaced (Dr. Marciano Sequin)  . Breast surgery 10/2002  . Abdominal hysterectomy 1985    endometriosis; anatomy    Family History  Problem Relation Age of Onset  . Ovarian cancer      Aunt  . Breast cancer Sister 61  . Cancer Sister     breast  . Heart attack Father 7  . Diabetes      GP  . Sarcoidosis Mother   . Cancer Paternal Aunt     colon/ovarian    Social History History  Substance Use Topics  . Smoking status: Former Games developer  . Smokeless tobacco: Former Neurosurgeon   Quit date: 01/24/1986  . Alcohol Use: No    Allergies  Allergen Reactions  . Ace Inhibitors     REACTION: Cough  . Codeine     Hallucinations  . Morphine   . Sulfonamide Derivatives     Current Outpatient Prescriptions  Medication Sig Dispense Refill  . aspirin 81 MG tablet Take 81 mg by mouth daily.        [name] Kitchen atorvastatin (LIPITOR) 20 MG tablet TAKE 1 TABLET AT BEDTIME  90 tablet  2  . Calcium Citrate-Vitamin D (CITRACAL + D PO) Take by mouth.      . Calcium-Magnesium-Vitamin D 500-50-100 MG-MG-UNIT CHEW Chew 1 tablet by mouth daily.        . chlorthalidone (HYGROTON) 25 MG tablet TAKE 1 TABLET DAILY  90 tablet  2  . FLUoxetine (PROZAC) 20 MG tablet TAKE 1 TABLET DAILY  90 tablet  2  . losartan (COZAAR) 50 MG tablet TAKE 1 TABLET DAILY  90 tablet  2  . Misc Natural Products (OSTEO BI-FLEX ADV DOUBLE ST) TABS Take 2 tablets by mouth daily.        . Multiple Vitamin (MULTIVITAMIN) tablet Take 1 tablet by mouth daily.        [name] Kitchen  Multiple Vitamins-Minerals (VISION FORMULA) TABS Take 1 tablet by mouth daily.          Review of Systems Review of Systems  Constitutional: Negative for fever, chills and unexpected weight change.  HENT: Negative for hearing loss, congestion, sore throat, trouble swallowing and voice change.   Eyes: Negative for visual disturbance.  Respiratory: Negative for cough and wheezing.   Cardiovascular: Negative for chest pain, palpitations and leg swelling.  Gastrointestinal: Negative for nausea, vomiting, abdominal pain, diarrhea, constipation, blood in stool, abdominal distention and anal bleeding.  Genitourinary: Negative for hematuria, vaginal bleeding and difficulty urinating.  Musculoskeletal: Negative for arthralgias.  Skin: Negative for rash and wound.  Neurological: Negative for seizures, syncope and headaches.  Hematological: Negative for adenopathy. Does not bruise/bleed easily.  Psychiatric/Behavioral: Negative for confusion.    Blood pressure  150/96, pulse 88, temperature 97 F (36.1 C), temperature source Temporal, height 5\' 5"  (1.651 m), weight 216 lb (97.977 kg).  Physical Exam Physical Exam  Constitutional: She is oriented to person, place, and time. She appears well-developed and well-nourished. No distress.  HENT:  Head: Normocephalic and atraumatic.  Eyes: Conjunctivae and EOM are normal. Pupils are equal, round, and reactive to light. Left eye exhibits no discharge. No scleral icterus.  Neck: Neck supple. No JVD present. No tracheal deviation present. No thyromegaly present.  Cardiovascular: Normal rate, regular rhythm, normal heart sounds and intact distal pulses.   No murmur heard. Pulmonary/Chest: Effort normal and breath sounds normal. No respiratory distress. She has no wheezes. She has no rales. She exhibits no tenderness.       Left mastectomy incision well healed. Skin flaps healthy. No nodules ulceration or axillary adenopathy. Right breast moderately large. Soft. No mass, no skin changes, no axillary adenopathy.  Musculoskeletal: She exhibits no edema and no tenderness.  Lymphadenopathy:    She has no cervical adenopathy.  Neurological: She is alert and oriented to person, place, and time. She exhibits normal muscle tone. Coordination normal.  Skin: Skin is warm. No rash noted. She is not diaphoretic. No erythema. No pallor.  Psychiatric: She has a normal mood and affect. Her behavior is normal. Judgment and thought content normal.    Data Reviewed Recent mammograms. Our old chart.  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  Strong family history of premenopausal breast cancer  BRCA negative    Plan    She will return to see me in one year for breast exam following her annual mammograms. This is her preference.       Angelia Mould. Derrell Lolling, M.D., [facility] General and Minimally invasive  Surgery Breast and Colorectal Surgery Office:   [phone] Pager:   [phone]  01/25/2012, 9:39 AM

## 2012-02-12 ENCOUNTER — Encounter: Payer: Self-pay | Admitting: Family Medicine

## 2012-02-12 ENCOUNTER — Ambulatory Visit (INDEPENDENT_AMBULATORY_CARE_PROVIDER_SITE_OTHER): Payer: 59 | Admitting: Family Medicine

## 2012-02-12 VITALS — BP 130/80 | HR 86 | Temp 98.1°F | Ht 65.0 in | Wt 215.0 lb

## 2012-02-12 DIAGNOSIS — D229 Melanocytic nevi, unspecified: Secondary | ICD-10-CM

## 2012-02-12 DIAGNOSIS — D239 Other benign neoplasm of skin, unspecified: Secondary | ICD-10-CM

## 2012-02-12 DIAGNOSIS — L989 Disorder of the skin and subcutaneous tissue, unspecified: Secondary | ICD-10-CM

## 2012-02-12 NOTE — Progress Notes (Signed)
[facility] at [facility]  Patient Name: [name] [name] Date of Birth: October 20, 1950 Medical Record Number: [id number] Gender: female Date of Encounter: 02/12/2012  History of Present Illness:  [name] [name] is a 61 y.o. very pleasant female patient who presents with the following:  Patent presents for evaluation of several skin lesions on posterior aspect of her back and upper shoulders.  Patient Active Problem List  Diagnoses  . HYPERLIPIDEMIA  . HYPERTENSION  . ALLERGIC RHINITIS  . OSTEOARTHRITIS  . ROTATOR CUFF SYNDROME, LEFT  . OTHER MALAISE AND FATIGUE  . BREAST CANCER, HX OF   Past Medical History  Diagnosis Date  . HLD (hyperlipidemia)   . HTN (hypertension)   . Allergic rhinitis   . Breast cancer     history of  . Osteoarthritis   . Osteopenia   . History of hepatitis B     ?--tested + by [employer]  . Avulsion fracture of distal fibula 3/10    and sprain; Left   Past Surgical History  Procedure Date  . Tonsillectomy 1958  . Mastectomy 2004  . Colonoscopy 8/09    Hyperplastic polyp; repeat in 10 years  . Hip fracture surgery 06/14/2011    ball replaced (Dr. Marciano Sequin)  . Breast surgery 10/2002  . Abdominal hysterectomy 1985    endometriosis; anatomy   History  Substance Use Topics  . Smoking status: Former Games developer  . Smokeless tobacco: Former Neurosurgeon    Quit date: 01/24/1986  . Alcohol Use: No   Family History  Problem Relation Age of Onset  . Ovarian cancer      Aunt  . Breast cancer Sister 64  . Cancer Sister     breast  . Heart attack Father 88  . Diabetes      GP  . Sarcoidosis Mother   . Cancer Paternal Aunt     colon/ovarian   Allergies  Allergen Reactions  . Ace Inhibitors     REACTION: Cough  . Codeine     Hallucinations  . Morphine   . Sulfonamide Derivatives     Medication list has been reviewed and updated.  Prior to Admission medications   Medication Sig Start Date End Date Taking? Authorizing Provider  aspirin 81  MG tablet Take 81 mg by mouth daily.     Yes Historical Provider, MD  atorvastatin (LIPITOR) 20 MG tablet TAKE 1 TABLET AT BEDTIME 08/04/11  Yes Amy E Bedsole, MD  Calcium Citrate-Vitamin D (CITRACAL + D PO) Take by mouth.   Yes Historical Provider, MD  Calcium-Magnesium-Vitamin D 500-50-100 MG-MG-UNIT CHEW Chew 1 tablet by mouth daily.     Yes Historical Provider, MD  chlorthalidone (HYGROTON) 25 MG tablet TAKE 1 TABLET DAILY 07/26/11  Yes Hannah Beat, MD  FLUoxetine (PROZAC) 20 MG tablet TAKE 1 TABLET DAILY 07/26/11  Yes Kaan Tosh, MD  losartan (COZAAR) 50 MG tablet TAKE 1 TABLET DAILY 07/26/11  Yes Hannah Beat, MD  Misc Natural Products (OSTEO BI-FLEX ADV DOUBLE ST) TABS Take 2 tablets by mouth daily.     Yes Historical Provider, MD  Multiple Vitamin (MULTIVITAMIN) tablet Take 1 tablet by mouth daily.     Yes Historical Provider, MD  Multiple Vitamins-Minerals (VISION FORMULA) TABS Take 1 tablet by mouth daily.     Yes Historical Provider, MD    Review of Systems:  No other c/o  Physical Examination: Filed Vitals:   02/12/12 1101  BP: 130/80  Pulse: 86  Temp: 98.1 F (36.7  C)   Filed Vitals:   02/12/12 1101  Height: 5\' 5"  (1.651 m)  Weight: 215 lb (97.523 kg)   Body mass index is 35.78 kg/(m^2).   GEN: WDWN, NAD, Non-toxic, Alert & Oriented x 3 HEENT: Atraumatic, Normocephalic.  Ears and Nose: No external deformity. EXTR: No clubbing/cyanosis/edema NEURO: Normal gait.  PSYCH: Normally interactive. Conversant. Not depressed or anxious appearing.  Calm demeanor.   SKIN: on the back of the LEFT shoulder the patient has 2 areas, one is approximately 3 mm across it is raised and somewhat pearly in appearance and additional area that is 2 mm and slightly hardened and very dark compared to the surrounding moles.  Assessment and Plan: 1. Skin lesion   2. Nevus     Punch biopsy, #1  Meds, vitals, and allergies reviewed.   Indication: suspicious  lesion  Location: Left Posterior shoulder  Size: 3 mm, 4 mm punch  Verbal informed consent obtained.  Pt aware of risks not limited to but including infection, bleeding, damage to near by organs.  Prep: etoh/betadine  Anesthesia: 1%lidocaine with epi, good effect  Punch made with 4 mm punch  1 stitch with 4-0 Ethilon  Tolerated well  Routine postprocedure instructions d/w pt- keep area clean and bandaged, follow up if concerns/spreading erythema/pain.     Punch biopsy, #2  Meds, vitals, and allergies reviewed.   Indication: suspicious lesion  Location: L posterior shoulder  Size: 2 mm  Verbal informed consent obtained.  Pt aware of risks not limited to but including infection, bleeding, damage to near by organs.  Prep: etoh/betadine  Anesthesia: 1%lidocaine with epi, good effect  Punch made with  4 mm punch  1 stitch made with 4-0 ethilon  Tolerated well  Routine postprocedure instructions d/w pt- keep area clean and bandaged, follow up if concerns/spreading erythema/pain.    Both sent to path  Hannah Beat, MD 02/12/2012 11:13 AM

## 2012-02-13 ENCOUNTER — Telehealth: Payer: Self-pay

## 2012-02-13 NOTE — Telephone Encounter (Signed)
Done, clarified - was mm, I wrote incorrectly on path form

## 2012-02-13 NOTE — Telephone Encounter (Signed)
[name] [name] with [place] pathology request verification of cm or mm. 2 Punch biopsies are 4 mm. Description of lesion site 1 was 3cm and site 2 was 2 cm. Derm path needs verification. Call [name] [name] [phone].

## 2012-02-19 ENCOUNTER — Ambulatory Visit (INDEPENDENT_AMBULATORY_CARE_PROVIDER_SITE_OTHER): Payer: 59 | Admitting: Family Medicine

## 2012-02-19 ENCOUNTER — Encounter: Payer: Self-pay | Admitting: Family Medicine

## 2012-02-19 VITALS — BP 120/70 | HR 77 | Temp 98.5°F | Ht 65.0 in | Wt 216.2 lb

## 2012-02-19 DIAGNOSIS — L989 Disorder of the skin and subcutaneous tissue, unspecified: Secondary | ICD-10-CM

## 2012-02-19 NOTE — Progress Notes (Signed)
Stitch removal x 2  N/c  Reviewed path, benign x 2

## 2012-02-21 ENCOUNTER — Encounter: Payer: Self-pay | Admitting: Family Medicine

## 2012-03-29 ENCOUNTER — Other Ambulatory Visit: Payer: Self-pay | Admitting: Family Medicine

## 2012-04-07 ENCOUNTER — Other Ambulatory Visit: Payer: Self-pay | Admitting: Family Medicine

## 2012-07-08 ENCOUNTER — Ambulatory Visit (INDEPENDENT_AMBULATORY_CARE_PROVIDER_SITE_OTHER): Payer: 59 | Admitting: Family Medicine

## 2012-07-08 ENCOUNTER — Encounter: Payer: Self-pay | Admitting: Family Medicine

## 2012-07-08 VITALS — BP 126/80 | HR 88 | Temp 98.7°F | Wt 218.8 lb

## 2012-07-08 DIAGNOSIS — M7710 Lateral epicondylitis, unspecified elbow: Secondary | ICD-10-CM

## 2012-07-08 DIAGNOSIS — M7711 Lateral epicondylitis, right elbow: Secondary | ICD-10-CM

## 2012-07-08 NOTE — Progress Notes (Signed)
Patient presents with lateral elbow pain.  Length of symptoms: 3 weeks acutely, off and on for months Hand effected: R  Patient describes a dull ache on the lateral elbow. There is some translation in the proximal forearm and in the distal upper arm. It is painful to lift with the hand facing down and to lift with the thumb in an upright position. Supination is painful. Patient points to the lateral epicondyle as the point of maximal tenderness near ECRB.  No trauma. Repetitive quilting  No prior fractures or operative interventions in the effective hand. Prior PT or HEP: some HEP and massage  Denies numbness or tingling. No significant neck or shoulder pain.  Hand of dominance: R  Patient Active Problem List  Diagnosis  . HYPERLIPIDEMIA  . HYPERTENSION  . ALLERGIC RHINITIS  . OSTEOARTHRITIS  . ROTATOR CUFF SYNDROME, LEFT  . OTHER MALAISE AND FATIGUE  . BREAST CANCER, HX OF    Past Medical History  Diagnosis Date  . HLD (hyperlipidemia)   . HTN (hypertension)   . Allergic rhinitis   . Breast cancer     history of  . Osteoarthritis   . Osteopenia   . History of hepatitis B     ?--tested + by [employer]  . Avulsion fracture of distal fibula 3/10    and sprain; Left    Past Surgical History  Procedure Date  . Tonsillectomy 1958  . Mastectomy 2004  . Colonoscopy 8/09    Hyperplastic polyp; repeat in 10 years  . Hip fracture surgery 06/14/2011    ball replaced (Dr. Marciano Sequin)  . Breast surgery 10/2002  . Abdominal hysterectomy 1985    endometriosis; anatomy    History  Substance Use Topics  . Smoking status: Former Games developer  . Smokeless tobacco: Former Neurosurgeon    Quit date: 01/24/1986  . Alcohol Use: No    Family History  Problem Relation Age of Onset  . Ovarian cancer      Aunt  . Breast cancer Sister 26  . Cancer Sister     breast  . Heart attack Father 13  . Diabetes      GP  . Sarcoidosis Mother   . Cancer Paternal Aunt     colon/ovarian     Allergies  Allergen Reactions  . Ace Inhibitors     REACTION: Cough  . Codeine     Hallucinations  . Morphine   . Sulfonamide Derivatives     Current Outpatient Prescriptions on File Prior to Visit  Medication Sig Dispense Refill  . aspirin 81 MG tablet Take 81 mg by mouth daily.        [name] Kitchen atorvastatin (LIPITOR) 20 MG tablet TAKE 1 TABLET AT BEDTIME  90 tablet  1  . Calcium Citrate-Vitamin D (CITRACAL + D PO) Take by mouth.      . chlorthalidone (HYGROTON) 25 MG tablet TAKE 1 TABLET DAILY  90 tablet  1  . FLUoxetine (PROZAC) 20 MG tablet TAKE 1 TABLET DAILY  90 tablet  1  . losartan (COZAAR) 50 MG tablet TAKE 1 TABLET DAILY  90 tablet  1  . Misc Natural Products (OSTEO BI-FLEX ADV DOUBLE ST) TABS Take 2 tablets by mouth daily.        . Multiple Vitamin (MULTIVITAMIN) tablet Take 1 tablet by mouth daily.        . Multiple Vitamins-Minerals (VISION FORMULA) TABS Take 1 tablet by mouth daily.  REVIEW OF SYSTEMS  GEN: No fevers, chills. Nontoxic. Primarily MSK c/o today. MSK: Detailed in the HPI GI: tolerating PO intake without difficulty Neuro: No numbness, parasthesias, or tingling associated. Otherwise the pertinent positives of the ROS are noted above.   PHYSICAL EXAM  Blood pressure 126/80, pulse 88, temperature 98.7 F (37.1 C), temperature source Oral, weight 218 lb 12 oz (99.224 kg).  GEN: Well-developed,well-nourished,in no acute distress; alert,appropriate and cooperative throughout examination HEENT: Normocephalic and atraumatic without obvious abnormalities. Ears, externally no deformities PULM: Breathing comfortably in no respiratory distress EXT: No clubbing, cyanosis, or edema PSYCH: Normally interactive. Cooperative during the interview. Pleasant. Friendly and conversant. Not anxious or depressed appearing. Normal, full affect.  R elbow Ecchymosis or edema: neg ROM: full flexion, extension, pronation, supination Shoulder ROM: Full Flexion:  5/5 Extension: 5/5, PAINFUL Supination: 5/5, PAINFUL Pronation: 5/5 Wrist ext: 5/5 Wrist flexion: 5/5 No gross bony abnormality Varus and Valgus stress: stable ECRB tenderness: YES, TTP Medial epicondyle: NT Lateral epicondyle, resisted wrist extension from wrist full pronation and flexion: PAINFUL grip: 5/5  sensation intact Tinel's, Elbow: negative  A/P: Lateral Epicondylitis: Elbow anatomy was reviewed, and tendinopathy was explained.  Pt. given a formal rehab program from Dr. Caralee Ates at [facility] and the [facility] on elbow rehabiliation.  Series of concentric and eccentric exercises should be done starting with no weight, work up to 1 lb, hammer, etc. Use counterforce strap if working or using hands. Emphasized stretching an cross-friction massage Emphasized proper palms up lifting biomechanics to unload ECRB   Lateral Epicondylitis Injection, R Verbal consent was obtained from the patient. Risks, benefits, and alternatives were discussed. Potential complications including loss of pigment, atrophy, and rare risk of infection were discussed. Prepped with Chloraprep and Ethyl Chloride used for anesthesia. Under sterile conditions, the patient was injected at the point of maximal tenderness at the ECRB tendon with 2 cc of Lidocaine 1% and 1 cc of Depo-Medrol 40 mg. Decreased pain after injection. No complications.  Needle size: 22 gauge 1 1/2 inch

## 2012-10-31 ENCOUNTER — Ambulatory Visit (INDEPENDENT_AMBULATORY_CARE_PROVIDER_SITE_OTHER)
Admission: RE | Admit: 2012-10-31 | Discharge: 2012-10-31 | Disposition: A | Discharge status: Home / Self Care | Payer: 59 | Source: Ambulatory Visit | Attending: Family Medicine | Admitting: Family Medicine

## 2012-10-31 ENCOUNTER — Encounter: Payer: Self-pay | Admitting: Family Medicine

## 2012-10-31 ENCOUNTER — Ambulatory Visit (INDEPENDENT_AMBULATORY_CARE_PROVIDER_SITE_OTHER): Payer: 59 | Admitting: Family Medicine

## 2012-10-31 VITALS — BP 120/84 | HR 91 | Temp 97.6°F | Ht 65.0 in | Wt 220.8 lb

## 2012-10-31 DIAGNOSIS — R0989 Other specified symptoms and signs involving the circulatory and respiratory systems: Secondary | ICD-10-CM

## 2012-10-31 DIAGNOSIS — R002 Palpitations: Secondary | ICD-10-CM

## 2012-10-31 DIAGNOSIS — R0609 Other forms of dyspnea: Secondary | ICD-10-CM

## 2012-10-31 DIAGNOSIS — R0600 Dyspnea, unspecified: Secondary | ICD-10-CM

## 2012-10-31 DIAGNOSIS — Z79899 Other long term (current) drug therapy: Secondary | ICD-10-CM

## 2012-10-31 DIAGNOSIS — R000 Tachycardia, unspecified: Secondary | ICD-10-CM

## 2012-10-31 DIAGNOSIS — E785 Hyperlipidemia, unspecified: Secondary | ICD-10-CM

## 2012-10-31 LAB — CBC WITH DIFFERENTIAL/PLATELET
Basophils Absolute: 0 10*3/uL (ref 0.0–0.1)
Basophils Relative: 0.2 % (ref 0.0–3.0)
Eosinophils Absolute: 0.2 10*3/uL (ref 0.0–0.7)
Eosinophils Relative: 1.8 % (ref 0.0–5.0)
HCT: 44.1 % (ref 36.0–46.0)
Hemoglobin: 14.8 g/dL (ref 12.0–15.0)
Lymphocytes Relative: 29 % (ref 12.0–46.0)
Lymphs Abs: 2.4 10*3/uL (ref 0.7–4.0)
MCHC: 33.5 g/dL (ref 30.0–36.0)
MCV: 87.3 fl (ref 78.0–100.0)
Monocytes Absolute: 0.7 10*3/uL (ref 0.1–1.0)
Monocytes Relative: 9 % (ref 3.0–12.0)
Neutro Abs: 5 10*3/uL (ref 1.4–7.7)
Neutrophils Relative %: 60 % (ref 43.0–77.0)
Platelets: 320 10*3/uL (ref 150.0–400.0)
RBC: 5.05 Mil/uL (ref 3.87–5.11)
RDW: 14.1 % (ref 11.5–14.6)
WBC: 8.3 10*3/uL (ref 4.5–10.5)

## 2012-10-31 LAB — TSH: TSH: 2.05 u[IU]/mL (ref 0.35–5.50)

## 2012-10-31 LAB — BASIC METABOLIC PANEL
BUN: 12 mg/dL (ref 6–23)
CO2: 32 mEq/L (ref 19–32)
Calcium: 9.9 mg/dL (ref 8.4–10.5)
Chloride: 97 mEq/L (ref 96–112)
Creatinine, Ser: 0.7 mg/dL (ref 0.4–1.2)
GFR: 91.83 mL/min (ref >60.00)
Glucose, Bld: 124 mg/dL — ABNORMAL HIGH (ref 70–99)
Potassium: 3.3 mEq/L — ABNORMAL LOW (ref 3.5–5.1)
Sodium: 138 mEq/L (ref 135–145)

## 2012-10-31 LAB — HEPATIC FUNCTION PANEL
ALT: 57 U/L — ABNORMAL HIGH (ref 0–35)
AST: 41 U/L — ABNORMAL HIGH (ref 0–37)
Albumin: 4.3 g/dL (ref 3.5–5.2)
Alkaline Phosphatase: 74 U/L (ref 39–117)
Bilirubin, Direct: 0 mg/dL (ref 0.0–0.3)
Total Bilirubin: 0.5 mg/dL (ref 0.3–1.2)
Total Protein: 7.7 g/dL (ref 6.0–8.3)

## 2012-10-31 LAB — LDL CHOLESTEROL, DIRECT: Direct LDL: 56.7 mg/dL

## 2012-10-31 MED ORDER — FLUTICASONE PROPIONATE 50 MCG/ACT NA SUSP: 2.0000 | Freq: Every day | NASAL | Status: DC

## 2012-10-31 NOTE — Patient Instructions (Addendum)
REFERRAL: GO THE THE FRONT ROOM AT THE ENTRANCE OF OUR CLINIC, NEAR CHECK IN. ASK FOR MARION. SHE WILL HELP YOU SET UP YOUR REFERRAL. DATE: TIME:  

## 2012-10-31 NOTE — Progress Notes (Signed)
Nature conservation officer at [facility] [address] [place] [address] Phone: [phone] Fax: [phone]  Date:  10/31/2012   Name:  [name] [name]   DOB:  09-12-1950   MRN:  [id number] Gender: female Age: 62 y.o.  Primary Physician:  Hannah Beat, MD  Evaluating MD: Hannah Beat, MD   Chief Complaint: Arm Pain, Shortness of Breath and Tachycardia   History of Present Illness:  [name] [name] is a 62 y.o. pleasant patient who presents with the following:  Pleasant patient with no history of cardiac or cardiovascular disease with risk factors including hypertension, hyperlipidemia, obesity, smoking history distantly who presents with worsening dyspnea on exertion over the last few months. She is also having shortness of breath, RIGHT arm pain, and intermittent tachycardia and palpitations.  The palpitations have been ongoing for about a year, but they have gradually been getting worse. They last for minutes at a time, and they're occurring every one to 2 days.  Shortness of breath with walking only about 80-90 feet. Short of breath with walking around the house. No chest pain. Irregular heart beat --- on average about every day or every other day. Uncomfortable. Shortness of breath could walk approaching a mile about 6-12 months ago, and now she is quite limited. She cannot walk to her father's house, which is next door without stopping to catch her breath.  Smoked about 1/2 pack a day for 10 years    Patient Active Problem List  Diagnosis  . HYPERLIPIDEMIA  . HYPERTENSION  . ALLERGIC RHINITIS  . OSTEOARTHRITIS  . ROTATOR CUFF SYNDROME, LEFT  . OTHER MALAISE AND FATIGUE  . BREAST CANCER, HX OF    Past Medical History  Diagnosis Date  . HLD (hyperlipidemia)   . HTN (hypertension)   . Allergic rhinitis   . Breast cancer     history of  . Osteoarthritis   . Osteopenia   . History of hepatitis B     ?--tested + by [employer]  . Avulsion fracture of  distal fibula 3/10    and sprain; Left    Past Surgical History  Procedure Laterality Date  . Tonsillectomy  1958  . Mastectomy  2004  . Colonoscopy  8/09    Hyperplastic polyp; repeat in 10 years  . Hip fracture surgery  06/14/2011    ball replaced (Dr. Marciano Sequin)  . Breast surgery  10/2002  . Abdominal hysterectomy  1985    endometriosis; anatomy    History  Substance Use Topics  . Smoking status: Former Games developer  . Smokeless tobacco: Former Neurosurgeon    Quit date: 01/24/1986  . Alcohol Use: No    Family History  Problem Relation Age of Onset  . Ovarian cancer      Aunt  . Breast cancer Sister 20  . Cancer Sister     breast  . Heart attack Father 32  . Diabetes      GP  . Sarcoidosis Mother   . Cancer Paternal Aunt     colon/ovarian    Allergies  Allergen Reactions  . Ace Inhibitors     REACTION: Cough  . Codeine     Hallucinations  . Morphine   . Sulfonamide Derivatives     Medication list has been reviewed and updated.  Outpatient Prescriptions Prior to Visit  Medication Sig Dispense Refill  . aspirin 81 MG tablet Take 81 mg by mouth daily.        [name] Kitchen atorvastatin (LIPITOR)  20 MG tablet TAKE 1 TABLET AT BEDTIME  90 tablet  1  . Calcium Citrate-Vitamin D (CITRACAL + D PO) Take by mouth.      . chlorthalidone (HYGROTON) 25 MG tablet TAKE 1 TABLET DAILY  90 tablet  1  . FLUoxetine (PROZAC) 20 MG tablet TAKE 1 TABLET DAILY  90 tablet  1  . losartan (COZAAR) 50 MG tablet TAKE 1 TABLET DAILY  90 tablet  1  . Misc Natural Products (OSTEO BI-FLEX ADV DOUBLE ST) TABS Take 2 tablets by mouth daily.        . Multiple Vitamin (MULTIVITAMIN) tablet Take 1 tablet by mouth daily.        . Multiple Vitamins-Minerals (VISION FORMULA) TABS Take 1 tablet by mouth daily.        [name] Kitchen OVER THE COUNTER MEDICATION Mega Red, take one by mouth daily       No facility-administered medications prior to visit.    Review of Systems:  As above. Dyspnea on exertion. RIGHT arm pain. Some  shoulder pain. RIGHT side. No frank chest pain or chest pressure. Palpitations. No nausea, vomiting, or diarrhea.  Physical Examination: BP 120/84  Pulse 91  Temp(Src) 97.6 F (36.4 C) (Oral)  Ht 5\' 5"  (1.651 m)  Wt 220 lb 12 oz (100.132 kg)  BMI 36.73 kg/m2  SpO2 96%  Ideal Body Weight: Weight in (lb) to have BMI = 25: 149.9   GEN: WDWN, NAD, Non-toxic, A & O x 3 HEENT: Atraumatic, Normocephalic. Neck supple. No masses, No LAD. Ears and Nose: No external deformity. CV: RRR, No M/G/R. No JVD. No thrill. No extra heart sounds. PULM: CTA B, no wheezes, crackles, rhonchi. No retractions. No resp. distress. No accessory muscle use. EXTR: No c/c/e NEURO Normal gait.  PSYCH: Normally interactive. Conversant. Not depressed or anxious appearing.  Calm demeanor.   MSK: RIGHT shoulder: Tender at the a.c. Joint. Moderately tender in the bicipital groove. Limitation on abduction 140. Limitation of flexion to approximately 150. Neer test and Hawkins-Kennedy test are markedly positive. Crossover test is positive.  Objective: Dg Chest 2 View  10/31/2012  *RADIOLOGY REPORT*  Clinical Data: Dyspnea on exertion.  CHEST - 2 VIEW  Comparison: Single view of the chest 06/13/2011 CT chest 06/26/2005.  Findings: The lungs are clear.  There is mild cardiomegaly.  No pneumothorax or pleural effusion.  IMPRESSION: Cardiomegaly without acute disease.   Original Report Authenticated By: Holley Dexter, M.D.     Assessment and Plan:  Dyspnea on exertion - Plan: DG Chest 2 View, Ambulatory referral to Cardiology  Tachycardia - Plan: EKG 12-Lead, EKG 12-Lead, Ambulatory referral to Cardiology, Basic metabolic panel, CBC with Differential, TSH  Palpitations - Plan: Ambulatory referral to Cardiology, Basic metabolic panel, CBC with Differential, TSH  Other and unspecified hyperlipidemia - Plan: LDL cholesterol, direct  Encounter for long-term (current) use of other medications - Plan: Hepatic function  panel  EKG: Normal sinus rhythm. Normal axis, normal R wave progression, No acute ST elevation or depression. Lead III with inverted T wave, which is nonspecific  Worsening dyspnea on exertion, intermittent tachycardia and palpitations in a patient with cardiomegaly on chest x-ray with multiple risk factors including obesity, distant smoking history, controlled hypertension and controlled hyperlipidemia. Clinical concern, and we're asking our cardiology colleagues to help with further evaluation.  RIGHT shoulder rotator cuff tendinopathy and subacromial bursitis. At this point the cardiac questions outweigh this significantly. She is going to work on range of motion and basic  rotator cuff and scapular stabilization. Followup after cardiac workup is complete.  Orders Today:  Orders Placed This Encounter  Procedures  . DG Chest 2 View    Standing Status: Future     Number of Occurrences: 1     Standing Expiration Date: 12/31/2013    Order Specific Question:  Preferred imaging location?    Answer:  [facility]    Order Specific Question:  Reason for exam:    Answer:  h/o smoking, short of breath  . Basic metabolic panel  . CBC with Differential  . Hepatic function panel  . LDL cholesterol, direct  . TSH  . Ambulatory referral to Cardiology    Referral Priority:  Routine    Referral Type:  Consultation    Referral Reason:  Specialty Services Required    Requested Specialty:  Cardiology    Number of Visits Requested:  1  . EKG 12-Lead    Standing Status: Standing     Number of Occurrences: 1     Standing Expiration Date:     Order Specific Question:  Reason for Exam    Answer:  tachycardia    Updated Medication List: (Includes new medications, updates to list, dose adjustments) Meds ordered this encounter  Medications  . fluticasone (FLONASE) 50 MCG/ACT nasal spray    Sig: Place 2 sprays into the nose daily.    Dispense:  16 g    Refill:  12    Medications  Discontinued: There are no discontinued medications.    Signed, Elpidio Galea. Jessiah Wojnar, MD 10/31/2012 9:58 AM

## 2012-11-05 ENCOUNTER — Ambulatory Visit (INDEPENDENT_AMBULATORY_CARE_PROVIDER_SITE_OTHER): Payer: 59 | Admitting: Cardiovascular Disease

## 2012-11-05 ENCOUNTER — Encounter: Payer: Self-pay | Admitting: Cardiovascular Disease

## 2012-11-05 VITALS — BP 102/80 | HR 92 | Ht 66.0 in | Wt 221.5 lb

## 2012-11-05 VITALS — BP 130/92 | HR 92 | Ht 66.0 in | Wt 221.5 lb

## 2012-11-05 DIAGNOSIS — R0600 Dyspnea, unspecified: Secondary | ICD-10-CM

## 2012-11-05 DIAGNOSIS — R0602 Shortness of breath: Secondary | ICD-10-CM

## 2012-11-05 DIAGNOSIS — I10 Essential (primary) hypertension: Secondary | ICD-10-CM

## 2012-11-05 DIAGNOSIS — R002 Palpitations: Secondary | ICD-10-CM

## 2012-11-05 MED ORDER — CARVEDILOL 6.25 MG PO TABS: 6.2500 mg | ORAL_TABLET | Freq: Two times a day (BID) | ORAL | Status: DC

## 2012-11-05 NOTE — Assessment & Plan Note (Signed)
The patient has significant progressive exertional dyspnea which I suspect is likely multifactorial. Obviously, she does have risk factors for coronary artery disease and thus I decided to proceed with a treadmill stress test. She had baseline sinus tachycardia with rapid heart rate response to exercise. Her heart rate went up quickly to 157 beats per minute after 3 minutes of exercise. She had no chest pain and no ischemic ECG changes. Basically, the stress test was negative for ischemia but did show evidence of significant deconditioning which is likely contributing to her symptoms. There is also possibility of underlying diastolic heart failure which is worsened by her sinus tachycardia.  I will proceed with an echocardiogram to evaluate LV systolic/diastolic function as well as pulmonary pressure. In order to improve her tachycardia, I will switch her from losartan to carvedilol 6.25 mg twice daily. I advised her to start an exercise program.

## 2012-11-05 NOTE — Patient Instructions (Addendum)
Your physician has requested that you have an exercise tolerance test. For further information please visit [email or web address]. Please also follow instruction sheet, as given.   

## 2012-11-05 NOTE — Assessment & Plan Note (Signed)
Her blood pressure is reasonably controlled but she does have sinus tachycardia. I made the changes outlined above.

## 2012-11-05 NOTE — Patient Instructions (Addendum)
Your stress test is normal.   Your physician has requested that you have an echocardiogram. Echocardiography is a painless test that uses sound waves to create images of your heart. It provides your doctor with information about the size and shape of your heart and how well your heart's chambers and valves are working. This procedure takes approximately one hour. There are no restrictions for this procedure.  Stop Losartan.   Start Carvedilol 6.26 mg twice daily.   Start walking for 30 minutes 4-5 time/week.   Follow up after echo.

## 2012-11-05 NOTE — Progress Notes (Signed)
HPI  This is a pleasant 62 year old female who was referred by Dr. Patsy Lager for evaluation of exertional dyspnea. The patient is not aware of any previous cardiac history. She has known history of hypertension and hyperlipidemia both are well controlled with medications. She has no history of diabetes. She is a previous smoker and has no history of lung disease. There is family history of unspecified premature coronary artery disease. Her father had coronary artery disease in his early 26s. She has been having progressive exertional dyspnea which has been worsening over the last 1 or 2 years without any associated chest pain. She gets dyspneic with minimal activities with associated tachycardia and palpitations with physical activities. She gets dizzy and lightheaded without any syncope or presyncope. She has not been exercising regularly. She denies orthopnea, PND or lower extremity edema. There is no history of DVT or pulmonary embolism. She had routine labs performed recently which overall were unremarkable including normal thyroid function. LFTs are chronically mildly elevated. There was mild hypokalemia.  Allergies  Allergen Reactions  . Ace Inhibitors     REACTION: Cough  . Codeine     Hallucinations  . Morphine   . Sulfonamide Derivatives      Current Outpatient Prescriptions on File Prior to Visit  Medication Sig Dispense Refill  . aspirin 81 MG tablet Take 81 mg by mouth daily.        [name] Kitchen atorvastatin (LIPITOR) 20 MG tablet TAKE 1 TABLET AT BEDTIME  90 tablet  1  . Calcium Citrate-Vitamin D (CITRACAL + D PO) Take by mouth.      . chlorthalidone (HYGROTON) 25 MG tablet TAKE 1 TABLET DAILY  90 tablet  1  . FLUoxetine (PROZAC) 20 MG tablet TAKE 1 TABLET DAILY  90 tablet  1  . Misc Natural Products (OSTEO BI-FLEX ADV DOUBLE ST) TABS Take 2 tablets by mouth daily.        . Multiple Vitamin (MULTIVITAMIN) tablet Take 1 tablet by mouth daily.        . Multiple Vitamins-Minerals (VISION  FORMULA) TABS Take 1 tablet by mouth daily.        [name] Kitchen OVER THE COUNTER MEDICATION Mega Red, take one by mouth daily       No current facility-administered medications on file prior to visit.     Past Medical History  Diagnosis Date  . HLD (hyperlipidemia)   . HTN (hypertension)   . Allergic rhinitis   . Osteoarthritis   . Osteopenia   . History of hepatitis B     ?--tested + by [employer]  . Avulsion fracture of distal fibula 3/10    and sprain; Left  . Breast cancer     history of     Past Surgical History  Procedure Laterality Date  . Tonsillectomy  1958  . Mastectomy  2004  . Colonoscopy  8/09    Hyperplastic polyp; repeat in 10 years  . Hip fracture surgery  06/14/2011    ball replaced (Dr. Marciano Sequin)  . Breast surgery  10/2002  . Abdominal hysterectomy  1985    endometriosis; anatomy     Family History  Problem Relation Age of Onset  . Ovarian cancer      Aunt  . Diabetes      GP  . Breast cancer Sister 50  . Cancer Sister     breast  . Heart attack Father 73  . Heart disease Father   . Sarcoidosis Mother   .  Cancer Paternal Aunt     colon/ovarian     History   Social History  . Marital Status: Married    Spouse Name: N/A    Number of Children: N/A  . Years of Education: N/A   Occupational History  . Not on file.   Social History Main Topics  . Smoking status: Former Smoker -- 0.50 packs/day for 10 years    Types: Cigarettes  . Smokeless tobacco: Former Neurosurgeon    Quit date: 01/24/1986  . Alcohol Use: No  . Drug Use: No  . Sexually Active: Not on file   Other Topics Concern  . Not on file   Social History Narrative   Married      [employer]      No regular exercise     ROS Constitutional: Negative for fever, chills, diaphoresis, activity change, appetite change and fatigue.  HENT: Negative for hearing loss, nosebleeds, congestion, sore throat, facial swelling, drooling, trouble swallowing, neck pain, voice change, sinus pressure and  tinnitus.  Eyes: Negative for photophobia, pain, discharge and visual disturbance.  Respiratory: Negative for apnea, cough, chest tightness and wheezing.  Cardiovascular: Negative for chest pain and leg swelling.  Gastrointestinal: Negative for nausea, vomiting, abdominal pain, diarrhea, constipation, blood in stool and abdominal distention.  Genitourinary: Negative for dysuria, urgency, frequency, hematuria and decreased urine volume.  Musculoskeletal: Negative for myalgias, back pain, joint swelling, arthralgias and gait problem.  Skin: Negative for color change, pallor, rash and wound.  Neurological: Negative for tremors, seizures, syncope, speech difficulty, weakness, light-headedness, numbness and headaches.  Psychiatric/Behavioral: Negative for suicidal ideas, hallucinations, behavioral problems and agitation. The patient is not nervous/anxious.     PHYSICAL EXAM   BP 130/92  Pulse 92  Ht 5\' 6"  (1.676 m)  Wt 221 lb 8 oz (100.472 kg)  BMI 35.77 kg/m2 Constitutional: She is oriented to person, place, and time. She appears well-developed and well-nourished. No distress.  HENT: No nasal discharge.  Head: Normocephalic and atraumatic.  Eyes: Pupils are equal and round. Right eye exhibits no discharge. Left eye exhibits no discharge.  Neck: Normal range of motion. Neck supple. No JVD present. No thyromegaly present.  Cardiovascular: Normal rate, regular rhythm, normal heart sounds. Exam reveals no gallop and no friction rub. No murmur heard.  Pulmonary/Chest: Effort normal and breath sounds normal. No stridor. No respiratory distress. She has no wheezes. She has no rales. She exhibits no tenderness.  Abdominal: Soft. Bowel sounds are normal. She exhibits no distension. There is no tenderness. There is no rebound and no guarding.  Musculoskeletal: Normal range of motion. She exhibits no edema and no tenderness.  Neurological: She is alert and oriented to person, place, and time.  Coordination normal.  Skin: Skin is warm and dry. No rash noted. She is not diaphoretic. No erythema. No pallor.  Psychiatric: She has a normal mood and affect. Her behavior is normal. Judgment and thought content normal.     EKG: Sinus  Rhythm  Voltage criteria for LVH  (R(I)+S(III) exceeds 2.50 mV)  -Voltage criteria w/o ST/T abnormality may be normal.   -  Nonspecific T wave changes  BORDERLINE   ASSESSMENT AND PLAN

## 2012-11-05 NOTE — Procedures (Signed)
    Treadmill Stress test  Indication: Exertional dyspnea.  Baseline Data:  Resting EKG shows NSR with rate of 127 bpm, no significant ST or T wave changes. Resting blood pressure of 102/80 mm Hg Stand bruce protocal was used.  Exercise Data:  Patient exercised for 3 min 15 sec,  Peak heart rate of 156 bpm.  This was 98 % of the maximum predicted heart rate. No symptoms of chest pain or lightheadedness were reported at peak stress or in recovery.  Peak Blood pressure recorded was 170/100 Maximal work level: 4.6 METs.  Heart rate at 3 minutes in recovery was 105 bpm. BP response: Normal HR response: Rapid  EKG with Exercise: Sinus tachycardia with no significant ST changes. Few PVCs noted in recovery.  FINAL IMPRESSION: Normal exercise stress test. No significant EKG changes concerning for ischemia. Poor exercise tolerance. There is evidence of deconditioned heart rate response to exercise with resting sinus tachycardia.  Recommendation: Proceed with an echocardiogram to evaluate LV systolic function. I will switch losartan to carvedilol in order to improve tachycardia. I advised the patient to start an exercise program.

## 2012-11-12 ENCOUNTER — Ambulatory Visit: Payer: 59 | Admitting: Cardiovascular Disease

## 2012-11-25 ENCOUNTER — Other Ambulatory Visit: Payer: Self-pay | Admitting: Family Medicine

## 2012-11-25 MED ORDER — DICLOFENAC SODIUM 75 MG PO TBEC: 75.0000 mg | DELAYED_RELEASE_TABLET | Freq: Two times a day (BID) | ORAL | Status: DC

## 2012-11-26 ENCOUNTER — Other Ambulatory Visit: Payer: 59

## 2012-12-05 ENCOUNTER — Encounter (INDEPENDENT_AMBULATORY_CARE_PROVIDER_SITE_OTHER): Payer: Self-pay | Admitting: General Surgery

## 2012-12-10 ENCOUNTER — Other Ambulatory Visit: Payer: 59

## 2012-12-10 ENCOUNTER — Other Ambulatory Visit (INDEPENDENT_AMBULATORY_CARE_PROVIDER_SITE_OTHER): Payer: 59

## 2012-12-10 ENCOUNTER — Other Ambulatory Visit: Payer: Self-pay

## 2012-12-10 DIAGNOSIS — R0602 Shortness of breath: Secondary | ICD-10-CM

## 2012-12-10 DIAGNOSIS — R002 Palpitations: Secondary | ICD-10-CM

## 2012-12-10 DIAGNOSIS — R0600 Dyspnea, unspecified: Secondary | ICD-10-CM

## 2012-12-16 ENCOUNTER — Encounter: Payer: Self-pay | Admitting: Cardiovascular Disease

## 2012-12-16 ENCOUNTER — Ambulatory Visit (INDEPENDENT_AMBULATORY_CARE_PROVIDER_SITE_OTHER): Payer: 59 | Admitting: Cardiovascular Disease

## 2012-12-16 VITALS — BP 141/97 | HR 69 | Ht 66.0 in | Wt 224.5 lb

## 2012-12-16 DIAGNOSIS — I10 Essential (primary) hypertension: Secondary | ICD-10-CM

## 2012-12-16 DIAGNOSIS — R0789 Other chest pain: Secondary | ICD-10-CM

## 2012-12-16 MED ORDER — CARVEDILOL 6.25 MG PO TABS: 6.2500 mg | ORAL_TABLET | Freq: Two times a day (BID) | ORAL | Status: DC

## 2012-12-16 MED ORDER — LOSARTAN POTASSIUM 50 MG PO TABS: 50.0000 mg | ORAL_TABLET | Freq: Every day | ORAL | Status: DC

## 2012-12-16 NOTE — Assessment & Plan Note (Signed)
Ms. Doi is now reporting frequent substernal chest tightness worsen with physical activities lasting about 10 minutes. She has been having it significant exertional dyspnea. Although her recent cardiac evaluation has been reassuring, her current  symptoms are worrisome for possible obstructive coronary artery disease especially with her multiple risk factors. Due to that, I recommend proceeding with cardiac catheterization and possible coronary intervention. Risks, benefits and alternatives were discussed with the patient and her husband.

## 2012-12-16 NOTE — Progress Notes (Signed)
Primary care physician: Hannah Beat, MD  HPI  This is a pleasant 62 year old female who is here today for followup visit. She has known history of hypertension and hyperlipidemia both are well controlled with medications. She has no history of diabetes. She is a previous smoker and has no history of lung disease. There is family history of unspecified premature coronary artery disease. Her father had coronary artery disease in his early 74s.  She was seen recently for significant exertional dyspnea. She underwent a treadmill stress test which overall showed no clear evidence of ischemia. However, she had poor exercise tolerance and rapid heart rate response to exercise. During last visit, I switched losartan to carvedilol. She reports improvement in palpitations. However, she continues to have significant exertional dyspnea and now reports substernal chest tightness both at rest and with physical activities lasting for about 10 minutes. She had an echocardiogram done which showed normal LV systolic function, mild ventricular hypertrophy and mild diastolic dysfunction. No evidence of pulmonary hypertension.  Allergies  Allergen Reactions  . Ace Inhibitors     REACTION: Cough  . Codeine     Hallucinations  . Morphine   . Sulfonamide Derivatives      Current Outpatient Prescriptions on File Prior to Visit  Medication Sig Dispense Refill  . aspirin 81 MG tablet Take 81 mg by mouth daily.        [name] Kitchen atorvastatin (LIPITOR) 20 MG tablet TAKE 1 TABLET AT BEDTIME  90 tablet  1  . Calcium Citrate-Vitamin D (CITRACAL + D PO) Take by mouth.      . chlorthalidone (HYGROTON) 25 MG tablet TAKE 1 TABLET DAILY  90 tablet  1  . diclofenac (VOLTAREN) 75 MG EC tablet Take 1 tablet (75 mg total) by mouth 2 (two) times daily.  60 tablet  3  . FLUoxetine (PROZAC) 20 MG tablet TAKE 1 TABLET DAILY  90 tablet  1  . GRAPE SEED EXTRACT PO Take by mouth. Takes 2 tablets daily.      . Misc Natural Products (OSTEO  BI-FLEX ADV DOUBLE ST) TABS Take 2 tablets by mouth daily.        . Multiple Vitamin (MULTIVITAMIN) tablet Take 1 tablet by mouth daily.        . Multiple Vitamins-Minerals (VISION FORMULA) TABS Take 1 tablet by mouth daily.        [name] Kitchen OVER THE COUNTER MEDICATION Mega Red, take one by mouth daily      . SALINE MIST SPRAY NA Place 2 sprays into the nose 2 (two) times daily.       No current facility-administered medications on file prior to visit.     Past Medical History  Diagnosis Date  . HLD (hyperlipidemia)   . HTN (hypertension)   . Allergic rhinitis   . Osteoarthritis   . Osteopenia   . History of hepatitis B     ?--tested + by [employer]  . Avulsion fracture of distal fibula 3/10    and sprain; Left  . Breast cancer     history of     Past Surgical History  Procedure Laterality Date  . Tonsillectomy  1958  . Mastectomy  2004  . Colonoscopy  8/09    Hyperplastic polyp; repeat in 10 years  . Hip fracture surgery  06/14/2011    ball replaced (Dr. Marciano Sequin)  . Breast surgery  10/2002  . Abdominal hysterectomy  1985    endometriosis; anatomy     Family History  Problem Relation Age of Onset  . Ovarian cancer      Aunt  . Diabetes      GP  . Breast cancer Sister 5  . Cancer Sister     breast  . Heart attack Father 34  . Heart disease Father   . Sarcoidosis Mother   . Cancer Paternal Aunt     colon/ovarian     History   Social History  . Marital Status: Married    Spouse Name: N/A    Number of Children: N/A  . Years of Education: N/A   Occupational History  . Not on file.   Social History Main Topics  . Smoking status: Former Smoker -- 0.50 packs/day for 10 years    Types: Cigarettes  . Smokeless tobacco: Former Neurosurgeon    Quit date: 01/24/1986  . Alcohol Use: No  . Drug Use: No  . Sexually Active: Not on file   Other Topics Concern  . Not on file   Social History Narrative   Married      [employer]      No regular exercise       PHYSICAL EXAM   BP 141/97  Pulse 69  Ht 5\' 6"  (1.676 m)  Wt 224 lb 8 oz (101.833 kg)  BMI 36.25 kg/m2 Constitutional: She is oriented to person, place, and time. She appears well-developed and well-nourished. No distress.  HENT: No nasal discharge.  Head: Normocephalic and atraumatic.  Eyes: Pupils are equal and round. Right eye exhibits no discharge. Left eye exhibits no discharge.  Neck: Normal range of motion. Neck supple. No JVD present. No thyromegaly present.  Cardiovascular: Normal rate, regular rhythm, normal heart sounds. Exam reveals no gallop and no friction rub. No murmur heard.  Pulmonary/Chest: Effort normal and breath sounds normal. No stridor. No respiratory distress. She has no wheezes. She has no rales. She exhibits no tenderness.  Abdominal: Soft. Bowel sounds are normal. She exhibits no distension. There is no tenderness. There is no rebound and no guarding.  Musculoskeletal: Normal range of motion. She exhibits no edema and no tenderness.  Neurological: She is alert and oriented to person, place, and time. Coordination normal.  Skin: Skin is warm and dry. No rash noted. She is not diaphoretic. No erythema. No pallor.  Psychiatric: She has a normal mood and affect. Her behavior is normal. Judgment and thought content normal.     EKG: Normal sinus rhythm with nonspecific ST changes in the inferior leads.   ASSESSMENT AND PLAN

## 2012-12-16 NOTE — Assessment & Plan Note (Signed)
Her resting heart rate improved after the addition of carvedilol. However, her blood pressure is elevated. I will resume losartan 50 mg once daily. Continue other medications.

## 2012-12-16 NOTE — Addendum Note (Signed)
Addended by: Sabino Snipes E on: 12/16/2012 10:39 AM   Modules accepted: Orders

## 2012-12-16 NOTE — Patient Instructions (Addendum)
Resume Losartan 50 mg once daily.  Continue other medications.  Your physician has requested that you have a cardiac catheterization. Cardiac catheterization is used to diagnose and/or treat various heart conditions. Doctors may recommend this procedure for a number of different reasons. The most common reason is to evaluate chest pain. Chest pain can be a symptom of coronary artery disease (CAD), and cardiac catheterization can show whether plaque is narrowing or blocking your heart's arteries. This procedure is also used to evaluate the valves, as well as measure the blood flow and oxygen levels in different parts of your heart. For further information please visit [email or web address] Please follow instruction sheet, as given.

## 2012-12-17 LAB — CBC WITH DIFFERENTIAL
Basophils Absolute: 0 10*3/uL (ref 0.0–0.2)
Basos: 0 % (ref 0–3)
Eos: 2 % (ref 0–5)
Eosinophils Absolute: 0.2 10*3/uL (ref 0.0–0.4)
HCT: 44.1 % (ref 34.0–46.6)
Hemoglobin: 14.8 g/dL (ref 11.1–15.9)
Immature Grans (Abs): 0 10*3/uL (ref 0.0–0.1)
Immature Granulocytes: 0 % (ref 0–2)
Lymphocytes Absolute: 2.4 10*3/uL (ref 0.7–3.1)
Lymphs: 27 % (ref 14–46)
MCH: 29.6 pg (ref 26.6–33.0)
MCHC: 33.6 g/dL (ref 31.5–35.7)
MCV: 88 fL (ref 79–97)
Monocytes Absolute: 0.9 10*3/uL (ref 0.1–0.9)
Monocytes: 11 % (ref 4–12)
Neutrophils Absolute: 5.4 10*3/uL (ref 1.4–7.0)
Neutrophils Relative %: 60 % (ref 40–74)
Platelets: 373 10*3/uL (ref 155–379)
RBC: 5 x10E6/uL (ref 3.77–5.28)
RDW: 14.2 % (ref 12.3–15.4)
WBC: 9 10*3/uL (ref 3.4–10.8)

## 2012-12-17 LAB — BASIC METABOLIC PANEL
BUN/Creatinine Ratio: 19 (ref 11–26)
BUN: 13 mg/dL (ref 8–27)
CO2: 27 mmol/L (ref 19–28)
Calcium: 9.8 mg/dL (ref 8.6–10.2)
Chloride: 97 mmol/L (ref 97–108)
Creatinine, Ser: 0.67 mg/dL (ref 0.57–1.00)
GFR calc Af Amer: 110 mL/min/{1.73_m2} (ref >59)
GFR calc non Af Amer: 95 mL/min/{1.73_m2} (ref >59)
Glucose: 118 mg/dL — ABNORMAL HIGH (ref 65–99)
Potassium: 4.8 mmol/L (ref 3.5–5.2)
Sodium: 142 mmol/L (ref 134–144)

## 2012-12-17 LAB — PROTIME-INR
INR: 1 (ref 0.8–1.2)
Prothrombin Time: 10.8 s (ref 9.1–12.0)

## 2012-12-19 ENCOUNTER — Ambulatory Visit: Payer: Self-pay | Admitting: Cardiovascular Disease

## 2012-12-19 DIAGNOSIS — I2510 Atherosclerotic heart disease of native coronary artery without angina pectoris: Secondary | ICD-10-CM

## 2012-12-19 HISTORY — PX: CARDIAC CATHETERIZATION: SHX172

## 2012-12-20 ENCOUNTER — Other Ambulatory Visit: Payer: Self-pay | Admitting: Family Medicine

## 2012-12-20 MED ORDER — LOSARTAN POTASSIUM 50 MG PO TABS: 50.0000 mg | ORAL_TABLET | Freq: Every day | ORAL | Status: DC

## 2012-12-20 MED ORDER — ATORVASTATIN CALCIUM 20 MG PO TABS: ORAL_TABLET | ORAL | Status: DC

## 2012-12-20 NOTE — Telephone Encounter (Signed)
Last filled 03/29/2012.

## 2012-12-22 MED ORDER — CHLORTHALIDONE 25 MG PO TABS: ORAL_TABLET | ORAL | Status: DC

## 2012-12-22 MED ORDER — FLUOXETINE HCL 20 MG PO TABS: ORAL_TABLET | ORAL | Status: DC

## 2012-12-22 NOTE — Telephone Encounter (Signed)
done

## 2012-12-31 ENCOUNTER — Encounter: Payer: Self-pay | Admitting: Cardiovascular Disease

## 2012-12-31 ENCOUNTER — Ambulatory Visit (INDEPENDENT_AMBULATORY_CARE_PROVIDER_SITE_OTHER): Payer: 59 | Admitting: Cardiovascular Disease

## 2012-12-31 VITALS — BP 138/92 | HR 73 | Ht 66.0 in | Wt 222.8 lb

## 2012-12-31 DIAGNOSIS — R000 Tachycardia, unspecified: Secondary | ICD-10-CM

## 2012-12-31 DIAGNOSIS — I10 Essential (primary) hypertension: Secondary | ICD-10-CM

## 2012-12-31 DIAGNOSIS — R0600 Dyspnea, unspecified: Secondary | ICD-10-CM

## 2012-12-31 DIAGNOSIS — R0602 Shortness of breath: Secondary | ICD-10-CM

## 2012-12-31 DIAGNOSIS — I2510 Atherosclerotic heart disease of native coronary artery without angina pectoris: Secondary | ICD-10-CM | POA: Insufficient documentation

## 2012-12-31 DIAGNOSIS — I25119 Atherosclerotic heart disease of native coronary artery with unspecified angina pectoris: Secondary | ICD-10-CM | POA: Insufficient documentation

## 2012-12-31 MED ORDER — CARVEDILOL 12.5 MG PO TABS: 12.5000 mg | ORAL_TABLET | Freq: Two times a day (BID) | ORAL | Status: DC

## 2012-12-31 NOTE — Progress Notes (Signed)
Primary care physician: Hannah Beat, MD  HPI  This is a pleasant 62 year old female who is here today for followup visit after recent cardiac catheterization. She was seen recently for significant exertional dyspnea. She underwent a treadmill stress test which overall showed no clear evidence of ischemia. However, she had poor exercise tolerance and rapid heart rate response to exercise. She was switched from losartan to carvedilol. She had an echocardiogram done which showed normal LV systolic function, mild ventricular hypertrophy and mild diastolic dysfunction. No evidence of pulmonary hypertension. Upon followup, she reported more chest pain and thus I decided to proceed with cardiac catheterization which showed moderate mid RCA stenosis and mild proximal disease in the left circumflex. Ejection fraction was 50%. There was overall no evidence of obstructive CAD. She has been doing reasonably well. She has not had any further chest pain. Her dyspnea and palpitations improved.  Allergies  Allergen Reactions  . Ace Inhibitors     REACTION: Cough  . Codeine     Hallucinations  . Morphine   . Sulfonamide Derivatives      Current Outpatient Prescriptions on File Prior to Visit  Medication Sig Dispense Refill  . aspirin 81 MG tablet Take 81 mg by mouth daily.        [name] Kitchen atorvastatin (LIPITOR) 20 MG tablet TAKE 1 TABLET AT BEDTIME  90 tablet  1  . Calcium Citrate-Vitamin D (CITRACAL + D PO) Take by mouth.      . chlorthalidone (HYGROTON) 25 MG tablet TAKE 1 TABLET DAILY  90 tablet  1  . diclofenac (VOLTAREN) 75 MG EC tablet Take 1 tablet (75 mg total) by mouth 2 (two) times daily.  60 tablet  3  . FLUoxetine (PROZAC) 20 MG tablet TAKE 1 TABLET DAILY  90 tablet  1  . fluticasone (FLONASE) 50 MCG/ACT nasal spray Place into the nose daily.       [name] Kitchen GRAPE SEED EXTRACT PO Take by mouth. Takes 2 tablets daily.      [name] Kitchen losartan (COZAAR) 50 MG tablet Take 1 tablet (50 mg total) by mouth daily.   90 tablet  1  . Misc Natural Products (OSTEO BI-FLEX ADV DOUBLE ST) TABS Take 2 tablets by mouth daily.        . Multiple Vitamin (MULTIVITAMIN) tablet Take 1 tablet by mouth daily.        . Multiple Vitamins-Minerals (VISION FORMULA) TABS Take 1 tablet by mouth daily.        [name] Kitchen OVER THE COUNTER MEDICATION Mega Red, take one by mouth daily       No current facility-administered medications on file prior to visit.     Past Medical History  Diagnosis Date  . HLD (hyperlipidemia)   . HTN (hypertension)   . Allergic rhinitis   . Osteoarthritis   . Osteopenia   . History of hepatitis B     ?--tested + by [employer]  . Avulsion fracture of distal fibula 3/10    and sprain; Left  . Breast cancer     history of  . Coronary artery disease     Cardiac catheterization in April of 2014 at [facility]: 50% mid RCA stenosis and mild proximal disease in the left circumflex. Ejection fraction was 50%.     Past Surgical History  Procedure Laterality Date  . Tonsillectomy  1958  . Mastectomy  2004  . Colonoscopy  8/09    Hyperplastic polyp; repeat in 10 years  . Hip fracture  surgery  06/14/2011    ball replaced (Dr. Marciano Sequin)  . Breast surgery  10/2002  . Abdominal hysterectomy  1985    endometriosis; anatomy  . Cardiac catheterization  12/19/12    [facility]; EF 50%     Family History  Problem Relation Age of Onset  . Ovarian cancer      Aunt  . Diabetes      GP  . Breast cancer Sister 45  . Cancer Sister     breast  . Heart attack Father 73  . Heart disease Father   . Sarcoidosis Mother   . Cancer Paternal Aunt     colon/ovarian     History   Social History  . Marital Status: Married    Spouse Name: N/A    Number of Children: N/A  . Years of Education: N/A   Occupational History  . Not on file.   Social History Main Topics  . Smoking status: Former Smoker -- 0.50 packs/day for 10 years    Types: Cigarettes  . Smokeless tobacco: Former Neurosurgeon    Quit date: 01/24/1986  . Alcohol  Use: No  . Drug Use: No  . Sexually Active: Not on file   Other Topics Concern  . Not on file   Social History Narrative   Married      [employer]      No regular exercise      PHYSICAL EXAM   BP 138/92  Pulse 73  Ht 5\' 6"  (1.676 m)  Wt 222 lb 12 oz (101.039 kg)  BMI 35.97 kg/m2 Constitutional: She is oriented to person, place, and time. She appears well-developed and well-nourished. No distress.  HENT: No nasal discharge.  Head: Normocephalic and atraumatic.  Eyes: Pupils are equal and round. Right eye exhibits no discharge. Left eye exhibits no discharge.  Neck: Normal range of motion. Neck supple. No JVD present. No thyromegaly present.  Cardiovascular: Normal rate, regular rhythm, normal heart sounds. Exam reveals no gallop and no friction rub. No murmur heard.  Pulmonary/Chest: Effort normal and breath sounds normal. No stridor. No respiratory distress. She has no wheezes. She has no rales. She exhibits no tenderness.  Abdominal: Soft. Bowel sounds are normal. She exhibits no distension. There is no tenderness. There is no rebound and no guarding.  Musculoskeletal: Normal range of motion. She exhibits no edema and no tenderness.  Neurological: She is alert and oriented to person, place, and time. Coordination normal.  Skin: Skin is warm and dry. No rash noted. She is not diaphoretic. No erythema. No pallor.  Psychiatric: She has a normal mood and affect. Her behavior is normal. Judgment and thought content normal.     EKG: Normal sinus rhythm   ASSESSMENT AND PLAN

## 2012-12-31 NOTE — Assessment & Plan Note (Signed)
Her blood pressure is improved but still not at target. Thus, I will increase the dose of carvedilol to 12.5 mg twice daily.

## 2012-12-31 NOTE — Assessment & Plan Note (Signed)
[name] [name] has mild to moderate nonobstructive coronary artery disease for which I recommend aggressive medical therapy. She is already on small dose aspirin and atorvastatin. We discussed today the importance of healthy lifestyle. Blood pressure control is recommended.

## 2012-12-31 NOTE — Patient Instructions (Addendum)
Increase Carvedilol (Coreg) to 12.5 mg twice daily.  Continue other medications.  Follow up in 6 months.

## 2012-12-31 NOTE — Assessment & Plan Note (Signed)
This is likely due to significant diastolic dysfunction related to hypertension. She was noted to have moderately elevated left ventricular end-diastolic pressure during cardiac catheterization. She is already on chlorthalidone which will be continued.

## 2013-01-13 ENCOUNTER — Other Ambulatory Visit: Payer: Self-pay

## 2013-01-13 DIAGNOSIS — Z9012 Acquired absence of left breast and nipple: Secondary | ICD-10-CM

## 2013-01-13 DIAGNOSIS — Z1231 Encounter for screening mammogram for malignant neoplasm of breast: Secondary | ICD-10-CM

## 2013-01-15 ENCOUNTER — Ambulatory Visit (INDEPENDENT_AMBULATORY_CARE_PROVIDER_SITE_OTHER): Payer: 59 | Admitting: Family Medicine

## 2013-01-15 ENCOUNTER — Encounter: Payer: Self-pay | Admitting: Family Medicine

## 2013-01-15 VITALS — BP 110/74 | HR 73 | Temp 98.2°F | Ht 66.0 in | Wt 223.2 lb

## 2013-01-15 DIAGNOSIS — B079 Viral wart, unspecified: Secondary | ICD-10-CM

## 2013-01-15 DIAGNOSIS — M7711 Lateral epicondylitis, right elbow: Secondary | ICD-10-CM

## 2013-01-15 DIAGNOSIS — M7710 Lateral epicondylitis, unspecified elbow: Secondary | ICD-10-CM

## 2013-01-15 MED ORDER — LOSARTAN POTASSIUM 50 MG PO TABS: 50.0000 mg | ORAL_TABLET | Freq: Every day | ORAL | Status: DC

## 2013-01-15 NOTE — Progress Notes (Signed)
   Nature conservation officer at [facility] [address] [place] [address] Phone: [phone] Fax: [phone]  Date:  01/15/2013   Name:  [name] [name]   DOB:  Feb 14, 1951   MRN:  [id number] Gender: female Age: 62 y.o.  PCP:  Hannah Beat, MD  Evaluating MD: Hannah Beat, MD  Patient presents with lateral elbow pain.  Length of symptoms:6 mo Hand effected: R  Patient describes a dull ache on the lateral elbow. There is some translation in the proximal forearm and in the distal upper arm. It is painful to lift with the hand facing down and to lift with the thumb in an upright position. Supination is painful. Patient points to the lateral epicondyle as the point of maximal tenderness near ECRB.  No trauma.   No prior fractures or operative interventions in the effective hand. Prior PT or HEP: Rec HEP before.  LE inj x 1.  Denies numbness or tingling. No significant neck or shoulder pai, but  mild.  Hand of dominance: R  Place on R thumb: wart Place on L arm:   REVIEW OF SYSTEMS  GEN: No fevers, chills. Nontoxic. Primarily MSK c/o today. MSK: Detailed in the HPI GI: tolerating PO intake without difficulty Neuro: No numbness, parasthesias, or tingling associated. Otherwise the pertinent positives of the ROS are noted above.   PHYSICAL EXAM  Blood pressure 110/74, pulse 73, temperature 98.2 F (36.8 C), temperature source Oral, height 5\' 6"  (1.676 m), weight 223 lb 4 oz (101.266 kg), SpO2 99.00%.  GEN: Well-developed,well-nourished,in no acute distress; alert,appropriate and cooperative throughout examination HEENT: Normocephalic and atraumatic without obvious abnormalities. Ears, externally no deformities PULM: Breathing comfortably in no respiratory distress EXT: No clubbing, cyanosis, or edema PSYCH: Normally interactive. Cooperative during the interview. Pleasant. Friendly and conversant. Not anxious or depressed appearing. Normal, full affect.  R  elbow Ecchymosis or edema: neg ROM: full flexion, extension, pronation, supination Shoulder ROM: Full Flexion: 5/5 Extension: 5/5, PAINFUL Supination: 5/5, PAINFUL Pronation: 5/5 Wrist ext: 5/5 Wrist flexion: 5/5 No gross bony abnormality Varus and Valgus stress: stable ECRB tenderness: YES, TTP Medial epicondyle: NT Lateral epicondyle, resisted wrist extension from wrist full pronation and flexion: PAINFUL grip: 5/5  sensation intact Tinel's, Elbow: negative  A/P: Lateral Epicondylitis: Elbow anatomy was reviewed, and tendinopathy was explained.  Pt. given a formal rehab program Series of concentric and eccentric exercises should be done starting with no weight, work up to 1 lb, hammer, etc. Formal PT  Use counterforce strap if working or using hands. Emphasized stretching an cross-friction massage Emphasized proper palms up lifting biomechanics to unload ECRB   Cryotherapy  Reason: wart Location: R thumb and L arm  Liquid nitrogen was applied using the liquid nitrogen gun without difficulty with an otoscope tip for concentration. Tolerated well without complications.

## 2013-01-15 NOTE — Patient Instructions (Addendum)
F/u 2 months 

## 2013-02-19 ENCOUNTER — Ambulatory Visit
Admission: RE | Admit: 2013-02-19 | Discharge: 2013-02-19 | Disposition: A | Discharge status: Home / Self Care | Payer: 59 | Source: Ambulatory Visit

## 2013-02-19 DIAGNOSIS — Z9012 Acquired absence of left breast and nipple: Secondary | ICD-10-CM

## 2013-02-19 DIAGNOSIS — Z1231 Encounter for screening mammogram for malignant neoplasm of breast: Secondary | ICD-10-CM

## 2013-03-17 ENCOUNTER — Encounter: Payer: Self-pay | Admitting: Family Medicine

## 2013-03-17 ENCOUNTER — Ambulatory Visit (INDEPENDENT_AMBULATORY_CARE_PROVIDER_SITE_OTHER): Payer: 59 | Admitting: Family Medicine

## 2013-03-17 VITALS — BP 120/74 | HR 78 | Temp 98.1°F | Ht 66.0 in | Wt 223.2 lb

## 2013-03-17 DIAGNOSIS — M7711 Lateral epicondylitis, right elbow: Secondary | ICD-10-CM

## 2013-03-17 DIAGNOSIS — M7710 Lateral epicondylitis, unspecified elbow: Secondary | ICD-10-CM

## 2013-03-17 NOTE — Progress Notes (Signed)
Nature conservation officer at [facility] [address] [place] [address] Phone: [phone] Fax: [phone]  Date:  03/17/2013   Name:  [name] [name]   DOB:  1951/08/06   MRN:  [id number] Gender: female Age: 62 y.o.  Primary Physician:  Hannah Beat, MD  Evaluating MD: Hannah Beat, MD   Chief Complaint: Follow-up   History of Present Illness:  [name] [name] is a 62 y.o. pleasant patient who presents with the following:  F/U ELBOW:  Elbow is getting better. Doing PT of her elbow.  R elbow Still mild supination pain, but much better overall land compliant.  Patient Active Problem List   Diagnosis Date Noted  . Coronary artery disease   . OTHER MALAISE AND FATIGUE 12/28/2008  . ROTATOR CUFF SYNDROME, LEFT 12/10/2008  . HYPERLIPIDEMIA 09/28/2008  . HYPERTENSION 09/28/2008  . ALLERGIC RHINITIS 09/28/2008  . OSTEOARTHRITIS 09/28/2008  . BREAST CANCER, HX OF 09/28/2008    Past Medical History  Diagnosis Date  . HLD (hyperlipidemia)   . HTN (hypertension)   . Allergic rhinitis   . Osteoarthritis   . Osteopenia   . History of hepatitis B     ?--tested + by [employer]  . Avulsion fracture of distal fibula 3/10    and sprain; Left  . Breast cancer     history of  . Coronary artery disease     Cardiac catheterization in April of 2014 at [facility]: 50% mid RCA stenosis and mild proximal disease in the left circumflex. Ejection fraction was 50%.    Past Surgical History  Procedure Laterality Date  . Tonsillectomy  1958  . Mastectomy  2004  . Colonoscopy  8/09    Hyperplastic polyp; repeat in 10 years  . Hip fracture surgery  06/14/2011    ball replaced (Dr. Marciano Sequin)  . Breast surgery  10/2002  . Abdominal hysterectomy  1985    endometriosis; anatomy  . Cardiac catheterization  12/19/12    [facility]; EF 50%    History   Social History  . Marital Status: Married    Spouse Name: N/A    Number of Children: N/A  . Years of Education: N/A   Occupational  History  . Not on file.   Social History Main Topics  . Smoking status: Former Smoker -- 0.50 packs/day for 10 years    Types: Cigarettes  . Smokeless tobacco: Former Neurosurgeon    Quit date: 01/24/1986  . Alcohol Use: No  . Drug Use: No  . Sexually Active: Not on file   Other Topics Concern  . Not on file   Social History Narrative   Married      [employer]      No regular exercise    Family History  Problem Relation Age of Onset  . Ovarian cancer      Aunt  . Diabetes      GP  . Breast cancer Sister 42  . Cancer Sister     breast  . Heart attack Father 96  . Heart disease Father   . Sarcoidosis Mother   . Cancer Paternal Aunt     colon/ovarian    Allergies  Allergen Reactions  . Ace Inhibitors     REACTION: Cough  . Codeine     Hallucinations  . Morphine   . Sulfonamide Derivatives     Medication list has been reviewed and updated.  Outpatient Prescriptions Prior to Visit  Medication Sig Dispense Refill  .  aspirin 81 MG tablet Take 81 mg by mouth daily.        [name] Kitchen atorvastatin (LIPITOR) 20 MG tablet TAKE 1 TABLET AT BEDTIME  90 tablet  1  . Calcium Citrate-Vitamin D (CITRACAL + D PO) Take by mouth.      . carvedilol (COREG) 12.5 MG tablet Take 1 tablet (12.5 mg total) by mouth 2 (two) times daily.  60 tablet  6  . chlorthalidone (HYGROTON) 25 MG tablet TAKE 1 TABLET DAILY  90 tablet  1  . diclofenac (VOLTAREN) 75 MG EC tablet Take 1 tablet (75 mg total) by mouth 2 (two) times daily.  60 tablet  3  . FLUoxetine (PROZAC) 20 MG tablet TAKE 1 TABLET DAILY  90 tablet  1  . fluticasone (FLONASE) 50 MCG/ACT nasal spray Place into the nose daily.       [name] Kitchen GRAPE SEED EXTRACT PO Take by mouth. Takes 2 tablets daily.      [name] Kitchen losartan (COZAAR) 50 MG tablet Take 1 tablet (50 mg total) by mouth daily.  90 tablet  3  . Misc Natural Products (OSTEO BI-FLEX ADV DOUBLE ST) TABS Take 2 tablets by mouth daily.        . Multiple Vitamin (MULTIVITAMIN) tablet Take 1 tablet by mouth  daily.        . Multiple Vitamins-Minerals (VISION FORMULA) TABS Take 1 tablet by mouth daily.        [name] Kitchen OVER THE COUNTER MEDICATION Mega Red, take one by mouth daily       No facility-administered medications prior to visit.    Review of Systems:   GEN: No fevers, chills. Nontoxic. Primarily MSK c/o today. MSK: Detailed in the HPI GI: tolerating PO intake without difficulty Neuro: No numbness, parasthesias, or tingling associated. Otherwise the pertinent positives of the ROS are noted above.    Physical Examination: BP 120/74  Pulse 78  Temp(Src) 98.1 F (36.7 C) (Oral)  Ht 5\' 6"  (1.676 m)  Wt 223 lb 4 oz (101.266 kg)  BMI 36.05 kg/m2  SpO2 95%  Ideal Body Weight: Weight in (lb) to have BMI = 25: 154.6   GEN: WDWN, NAD, Non-toxic, Alert & Oriented x 3 HEENT: Atraumatic, Normocephalic.  Ears and Nose: No external deformity. EXTR: No clubbing/cyanosis/edema NEURO: Normal gait.  PSYCH: Normally interactive. Conversant. Not depressed or anxious appearing.  Calm demeanor.   R elbow Ecchymosis or edema: neg ROM: full flexion, extension, pronation, supination Shoulder ROM: Full Flexion: 5/5 Extension: 5/5 Supination: 5/5  Pronation: 5/5 Wrist ext: 5/5 Wrist flexion: 5/5 No gross bony abnormality Varus and Valgus stress: stable ECRB tenderness: neg Medial epicondyle: NT Lateral epicondyle, resisted wrist extension from wrist full pronation and flexion: minimal grip: 5/5  sensation intact Tinel's, Elbow: negative   Assessment and Plan: Lateral epicondylitis (tennis elbow), right   Much  Improved, cont hep  Signed, Tiffini Blacksher T. Dung Salinger, MD 03/17/2013 9:47 AM

## 2013-03-18 ENCOUNTER — Encounter (INDEPENDENT_AMBULATORY_CARE_PROVIDER_SITE_OTHER): Payer: Self-pay | Admitting: General Surgery

## 2013-03-18 ENCOUNTER — Ambulatory Visit (INDEPENDENT_AMBULATORY_CARE_PROVIDER_SITE_OTHER): Payer: 59 | Admitting: General Surgery

## 2013-03-18 VITALS — BP 112/68 | HR 68 | Temp 97.6°F | Resp 18 | Ht 66.0 in | Wt 221.8 lb

## 2013-03-18 DIAGNOSIS — Z853 Personal history of malignant neoplasm of breast: Secondary | ICD-10-CM

## 2013-03-18 NOTE — Progress Notes (Signed)
Patient ID: [name] [name], female   DOB: [birth year], 62 y.o.   MRN: [id number] History:   [name] [name] is a 62 y.o. female. 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  This patient underwent left total mastectomy and sentinel node biopsy Dr. Francina Ames on October 23, 2002. She had DCIS, pathologic stage Tis, N0. Family history is positive for premenopausal breast cancer in her sister and a cousin. BRCA testing was negative. She is not interested in reconstruction. She is not being followed by a medical oncologist.  She has no complaints about her left mastectomy site or her right breast. Right breast mammogram was performed on 02/19/13 and is category one, normal.   She continues to followup with her primary care physician, Dr. Kerin Perna at [facility].  ROS: 10 system review of systems is negative except as described above  Exam: Patient is pleasant. No distress. Weight 221. Neck no adenopathy or mass. Lungs clear to auscultation bilaterally Heart regular rate and rhythm. No murmur. No ectopy Breasts: Left mastectomy wound well healed. No nodules or ulceration. No axillary adenopathy. Right breast is moderately large. Soft. No skin change. No palpable mass. 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  Strong family history of premenopausal breast cancer  BRCA negative  Plan: She expressed an interest in transferring her breast cancer screening to her primary care physician, and I think that is a good idea. She will need an annual breast exam and annual mammography, and she agrees to do that Return to see me if further problems arise.    Angelia Mould. Derrell Lolling, M.D., [facility] General and Minimally invasive Surgery Breast and Colorectal Surgery Office:   [phone] Pager:   [phone]

## 2013-03-18 NOTE — Patient Instructions (Signed)
It has been 10 years since Dr. Maple Hudson performed your left mastectomy and sentinel node biopsy for noninvasive breast cancer.  Your physical exam  and mammograms are normal. There is no evidence of cancer.  We talked about annual followup, and you expressed an interest in having your primary care doctor do this. I think that is a good idea.  Be sure to get a mammogram annually and discuss that with your primary care physician. Be sure that your primary care physician also performs a breast exam annually.  Return to see Dr. Derrell Lolling if further problems arise.

## 2013-05-21 ENCOUNTER — Encounter: Payer: Self-pay | Admitting: Family Medicine

## 2013-05-21 ENCOUNTER — Ambulatory Visit (INDEPENDENT_AMBULATORY_CARE_PROVIDER_SITE_OTHER): Payer: 59 | Admitting: Family Medicine

## 2013-05-21 VITALS — BP 110/78 | HR 77 | Temp 98.2°F | Ht 66.0 in | Wt 220.5 lb

## 2013-05-21 DIAGNOSIS — M1710 Unilateral primary osteoarthritis, unspecified knee: Secondary | ICD-10-CM

## 2013-05-21 DIAGNOSIS — IMO0002 Reserved for concepts with insufficient information to code with codable children: Secondary | ICD-10-CM

## 2013-05-21 DIAGNOSIS — M76891 Other specified enthesopathies of right lower limb, excluding foot: Secondary | ICD-10-CM

## 2013-05-21 DIAGNOSIS — Z23 Encounter for immunization: Secondary | ICD-10-CM

## 2013-05-21 DIAGNOSIS — M1711 Unilateral primary osteoarthritis, right knee: Secondary | ICD-10-CM

## 2013-05-21 NOTE — Progress Notes (Signed)
Nature conservation officer at [facility] [address] [place] [address] Phone: [phone] Fax: [phone]  Date:  05/21/2013   Name:  [name] [name]   DOB:  11-23-1950   MRN:  [id number] Gender: female Age: 62 y.o.  Primary Physician:  Hannah Beat, MD  Evaluating MD: Hannah Beat, MD  Chief Complaint: Knee Pain and Cough   History of Present Illness:  [name] [name] is a 62 y.o. very pleasant female patient who presents with the following:  Has been bothering her for a couple of weeks, getting up and down. Not as bad as a few years ago. Taking osteo biflex.  Crepitus significantly on the R. No giving way. No recent trauma. Taking some nsaids prn. Some swelling.  R pes bursitis and OA flare.   Past Medical History, Surgical History, Social History, Family History, Problem List, Medications, and Allergies have been reviewed and updated if relevant.  Current Outpatient Prescriptions on File Prior to Visit  Medication Sig Dispense Refill  . aspirin 81 MG tablet Take 81 mg by mouth daily.        [name] Kitchen atorvastatin (LIPITOR) 20 MG tablet TAKE 1 TABLET AT BEDTIME  90 tablet  1  . Calcium Citrate-Vitamin D (CITRACAL + D PO) Take by mouth.      . carvedilol (COREG) 12.5 MG tablet Take 1 tablet (12.5 mg total) by mouth 2 (two) times daily.  60 tablet  6  . chlorthalidone (HYGROTON) 25 MG tablet TAKE 1 TABLET DAILY  90 tablet  1  . diclofenac (VOLTAREN) 75 MG EC tablet Take 1 tablet (75 mg total) by mouth 2 (two) times daily.  60 tablet  3  . FLUoxetine (PROZAC) 20 MG tablet TAKE 1 TABLET DAILY  90 tablet  1  . fluticasone (FLONASE) 50 MCG/ACT nasal spray Place into the nose as needed.       [name] Kitchen GRAPE SEED EXTRACT PO Take by mouth. Takes 2 tablets daily.      [name] Kitchen losartan (COZAAR) 50 MG tablet Take 1 tablet (50 mg total) by mouth daily.  90 tablet  3  . Misc Natural Products (OSTEO BI-FLEX ADV DOUBLE ST) TABS Take 2 tablets by mouth daily.        . Multiple Vitamin (MULTIVITAMIN)  tablet Take 1 tablet by mouth daily.        . Multiple Vitamins-Minerals (VISION FORMULA) TABS Take 1 tablet by mouth daily.        [name] Kitchen OVER THE COUNTER MEDICATION Mega Red, take one by mouth daily       No current facility-administered medications on file prior to visit.    Review of Systems:  GEN: No fevers, chills. Nontoxic. Primarily MSK c/o today. MSK: Detailed in the HPI GI: tolerating PO intake without difficulty Neuro: No numbness, parasthesias, or tingling associated. Otherwise the pertinent positives of the ROS are noted above.    Physical Examination: BP 110/78  Pulse 77  Temp(Src) 98.2 F (36.8 C) (Oral)  Ht 5\' 6"  (1.676 m)  Wt 220 lb 8 oz (100.018 kg)  BMI 35.61 kg/m2   GEN: WDWN, NAD, Non-toxic, Alert & Oriented x 3 HEENT: Atraumatic, Normocephalic.  Ears and Nose: No external deformity. EXTR: No clubbing/cyanosis/edema  PSYCH: Normally interactive. Conversant. Not depressed or anxious appearing.  Calm demeanor.   Knee:  r Gait: notably antalgic ROM: 0-110 Effusion: minimal Echymosis or edema: none Patellar tendon NT Painful PLICA: neg Patellar grind: pos Medial and lateral patellar facet loading: negative  medial and lateral joint lines: medial most tender PES BURSA TENDER ON R Mcmurray's pain Flexion-pinch pain Varus and valgus stress: stable Lachman: neg Ant and Post drawer: neg Hip abduction, IR, ER: WNL Hip flexion str: 5/5 Hip abd: 5/5 Quad: 5/5 VMO atrophy: mild Hamstring concentric and eccentric: 5/5   Assessment and Plan: R knee pain and OA:  Cont ice, nsaids, relative rest, inject knee.  Knee Injection, R Patient verbally consented to procedure. Risks (including potential rare risk of infection), benefits, and alternatives explained. Sterilely prepped with Chloraprep. Ethyl cholride used for anesthesia. 8 cc Lidocaine 1% mixed with 2 cc of Depo-Medrol 40 mg injected using the anterolateral approach without difficulty. No complications  with procedure and tolerated well. Patient had decreased pain post-injection.   Pes Anserine Bursitis Injection, RIGHT Verbal consent was obtained. Risks (including rare infection, skin lightening, and potential atrophy), benefits, and alternatives explained. Sterilely prepped with Chloraprep. Ethyl chloride for anesthesia. Under sterile conditions, 2 cc of Lidocaine 1% and 1 cc of Depo-Medrol 40 mg injected directly on the pes anserinus perpendicularly taking the needle to bone then slightly withdrawing. No resistance encountered. No complications with procedure and tolerated well. Patient had decreased pain post-injection. 22 gauge 1 1/2 inch needle   Signed, Kati Riggenbach T. Michial Disney, MD 05/21/2013 10:52 AM

## 2013-06-13 ENCOUNTER — Other Ambulatory Visit: Payer: Self-pay | Admitting: Cardiovascular Disease

## 2013-06-13 ENCOUNTER — Other Ambulatory Visit: Payer: Self-pay | Admitting: *Deleted

## 2013-06-13 ENCOUNTER — Other Ambulatory Visit: Payer: Self-pay | Admitting: Family Medicine

## 2013-06-13 MED ORDER — CARVEDILOL 12.5 MG PO TABS: 12.5000 mg | ORAL_TABLET | Freq: Two times a day (BID) | ORAL | Status: DC

## 2013-06-13 NOTE — Telephone Encounter (Signed)
Refilled Carvedilol sent to [facility] pharmacy.

## 2013-06-18 ENCOUNTER — Ambulatory Visit (INDEPENDENT_AMBULATORY_CARE_PROVIDER_SITE_OTHER): Payer: 59 | Admitting: Cardiovascular Disease

## 2013-06-18 ENCOUNTER — Encounter: Payer: Self-pay | Admitting: Cardiovascular Disease

## 2013-06-18 VITALS — BP 132/84 | HR 101 | Ht 66.0 in | Wt 220.2 lb

## 2013-06-18 DIAGNOSIS — I2510 Atherosclerotic heart disease of native coronary artery without angina pectoris: Secondary | ICD-10-CM

## 2013-06-18 DIAGNOSIS — E785 Hyperlipidemia, unspecified: Secondary | ICD-10-CM

## 2013-06-18 DIAGNOSIS — I10 Essential (primary) hypertension: Secondary | ICD-10-CM

## 2013-06-18 DIAGNOSIS — I499 Cardiac arrhythmia, unspecified: Secondary | ICD-10-CM

## 2013-06-18 DIAGNOSIS — I5032 Chronic diastolic (congestive) heart failure: Secondary | ICD-10-CM

## 2013-06-18 DIAGNOSIS — I5033 Acute on chronic diastolic (congestive) heart failure: Secondary | ICD-10-CM | POA: Insufficient documentation

## 2013-06-18 MED ORDER — CARVEDILOL 12.5 MG PO TABS: 12.5000 mg | ORAL_TABLET | Freq: Two times a day (BID) | ORAL | Status: DC

## 2013-06-18 NOTE — Assessment & Plan Note (Signed)
She is currently on  atorvastatin 20 mg daily.

## 2013-06-18 NOTE — Assessment & Plan Note (Signed)
Blood pressure is well controlled on current medications. 

## 2013-06-18 NOTE — Progress Notes (Signed)
Primary care physician: Hannah Beat, MD  HPI  This is a pleasant 62 year old female who is here today for followup visit regarding moderate nonobstructive coronary artery disease and chronic diastolic heart failure.  Echocardiogram in 12/2012 showed normal LV systolic function, mild ventricular hypertrophy and mild diastolic dysfunction. No evidence of pulmonary hypertension. Cardiac cath in 12/2012 showed showed moderate mid RCA stenosis and mild proximal disease in the left circumflex. Ejection fraction was 50%. There was overall no evidence of obstructive CAD. LVEDP was 24 mm Hg.  The dose of carvedilol was increased during the last visit. The patient overall is feels significantly better. No recurrent chest pain. Dyspnea improved.  Allergies  Allergen Reactions  . Ace Inhibitors     REACTION: Cough  . Codeine     Hallucinations  . Morphine   . Sulfonamide Derivatives      Current Outpatient Prescriptions on File Prior to Visit  Medication Sig Dispense Refill  . aspirin 81 MG tablet Take 81 mg by mouth daily.        [name] Kitchen atorvastatin (LIPITOR) 20 MG tablet Take 1 tablet by mouth  every night at bedtime  90 tablet  1  . Calcium Citrate-Vitamin D (CITRACAL + D PO) Take by mouth.      . carvedilol (COREG) 12.5 MG tablet Take 1 tablet (12.5 mg total) by mouth 2 (two) times daily.  180 tablet  3  . chlorthalidone (HYGROTON) 25 MG tablet Take 1 tablet by mouth  daily  90 tablet  1  . diclofenac (VOLTAREN) 75 MG EC tablet Take 1 tablet (75 mg total) by mouth 2 (two) times daily.  60 tablet  3  . FLUoxetine (PROZAC) 20 MG tablet Take 1 tablet by mouth  daily  90 tablet  1  . fluticasone (FLONASE) 50 MCG/ACT nasal spray Place into the nose as needed.       [name] Kitchen GRAPE SEED EXTRACT PO Take by mouth. Takes 2 tablets daily.      [name] Kitchen losartan (COZAAR) 50 MG tablet Take 1 tablet (50 mg total) by mouth daily.  90 tablet  3  . Misc Natural Products (OSTEO BI-FLEX ADV DOUBLE ST) TABS Take 2 tablets  by mouth daily.        . Multiple Vitamin (MULTIVITAMIN) tablet Take 1 tablet by mouth daily.        . Multiple Vitamins-Minerals (VISION FORMULA) TABS Take 1 tablet by mouth daily.        [name] Kitchen OVER THE COUNTER MEDICATION Mega Red, take one by mouth daily       No current facility-administered medications on file prior to visit.     Past Medical History  Diagnosis Date  . HLD (hyperlipidemia)   . HTN (hypertension)   . Allergic rhinitis   . Osteoarthritis   . Osteopenia   . History of hepatitis B     ?--tested + by [employer]  . Avulsion fracture of distal fibula 3/10    and sprain; Left  . Breast cancer     history of  . Coronary artery disease     Cardiac catheterization in April of 2014 at [facility]: 50% mid RCA stenosis and mild proximal disease in the left circumflex. Ejection fraction was 50%.     Past Surgical History  Procedure Laterality Date  . Tonsillectomy  1958  . Mastectomy  2004  . Colonoscopy  8/09    Hyperplastic polyp; repeat in 10 years  . Hip fracture surgery  06/14/2011  ball replaced (Dr. Marciano Sequin)  . Breast surgery  10/2002  . Abdominal hysterectomy  1985    endometriosis; anatomy  . Cardiac catheterization  12/19/12    [facility]; EF 50%     Family History  Problem Relation Age of Onset  . Ovarian cancer      Aunt  . Diabetes      GP  . Breast cancer Sister 3  . Cancer Sister     breast  . Heart attack Father 42  . Heart disease Father   . Sarcoidosis Mother   . Cancer Paternal Aunt     colon/ovarian     History   Social History  . Marital Status: Married    Spouse Name: N/A    Number of Children: N/A  . Years of Education: N/A   Occupational History  . Not on file.   Social History Main Topics  . Smoking status: Former Smoker -- 0.50 packs/day for 10 years    Types: Cigarettes  . Smokeless tobacco: Former Neurosurgeon    Quit date: 01/24/1986  . Alcohol Use: No  . Drug Use: No  . Sexual Activity: Not on file   Other Topics Concern  .  Not on file   Social History Narrative   Married      [employer]      No regular exercise      PHYSICAL EXAM   BP 132/84  Pulse 101  Ht 5\' 6"  (1.676 m)  Wt 220 lb 4 oz (99.905 kg)  BMI 35.57 kg/m2 Constitutional: She is oriented to person, place, and time. She appears well-developed and well-nourished. No distress.  HENT: No nasal discharge.  Head: Normocephalic and atraumatic.  Eyes: Pupils are equal and round. Right eye exhibits no discharge. Left eye exhibits no discharge.  Neck: Normal range of motion. Neck supple. No JVD present. No thyromegaly present.  Cardiovascular: Normal rate, regular rhythm, normal heart sounds. Exam reveals no gallop and no friction rub. No murmur heard.  Pulmonary/Chest: Effort normal and breath sounds normal. No stridor. No respiratory distress. She has no wheezes. She has no rales. She exhibits no tenderness.  Abdominal: Soft. Bowel sounds are normal. She exhibits no distension. There is no tenderness. There is no rebound and no guarding.  Musculoskeletal: Normal range of motion. She exhibits no edema and no tenderness.  Neurological: She is alert and oriented to person, place, and time. Coordination normal.  Skin: Skin is warm and dry. No rash noted. She is not diaphoretic. No erythema. No pallor.  Psychiatric: She has a normal mood and affect. Her behavior is normal. Judgment and thought content normal.     EKG: Sinus  Tachycardia  -Left axis -anterior fascicular block.   Voltage criteria for LVH  (R(aVL) exceeds 1.26 mV)  -Voltage criteria w/o ST/T abnormality may be normal.   ABNORMAL   ASSESSMENT AND PLAN

## 2013-06-18 NOTE — Assessment & Plan Note (Signed)
Mild to moderate nonobstructive coronary artery disease with no symptoms of angina. Continue medical therapy.

## 2013-06-18 NOTE — Assessment & Plan Note (Signed)
Symptoms improved after increasing the dose of carvedilol in controlling blood pressure.

## 2013-06-18 NOTE — Patient Instructions (Signed)
Continue same medications.   Your physician wants you to follow-up in: 12 months.  You will receive a reminder letter in the mail two months in advance. If you don't receive a letter, please call our office to schedule the follow-up appointment.  

## 2013-06-27 ENCOUNTER — Other Ambulatory Visit: Payer: Self-pay | Admitting: *Deleted

## 2013-06-27 MED ORDER — CARVEDILOL 12.5 MG PO TABS: 12.5000 mg | ORAL_TABLET | Freq: Two times a day (BID) | ORAL | Status: DC

## 2013-06-27 NOTE — Telephone Encounter (Signed)
Requested Prescriptions   Signed Prescriptions Disp Refills  . carvedilol (COREG) 12.5 MG tablet 180 tablet 3    Sig: Take 1 tablet (12.5 mg total) by mouth 2 (two) times daily.    Authorizing Provider: ARIDA, MUHAMMAD A    Ordering User: LOPEZ, MARINA C    

## 2013-12-05 ENCOUNTER — Other Ambulatory Visit: Payer: Self-pay | Admitting: Family Medicine

## 2013-12-31 ENCOUNTER — Encounter (INDEPENDENT_AMBULATORY_CARE_PROVIDER_SITE_OTHER): Payer: 59 | Admitting: Ophthalmology

## 2013-12-31 DIAGNOSIS — H2510 Age-related nuclear cataract, unspecified eye: Secondary | ICD-10-CM

## 2013-12-31 DIAGNOSIS — H3530 Unspecified macular degeneration: Secondary | ICD-10-CM

## 2013-12-31 DIAGNOSIS — I10 Essential (primary) hypertension: Secondary | ICD-10-CM

## 2013-12-31 DIAGNOSIS — H35039 Hypertensive retinopathy, unspecified eye: Secondary | ICD-10-CM

## 2013-12-31 DIAGNOSIS — H43819 Vitreous degeneration, unspecified eye: Secondary | ICD-10-CM

## 2013-12-31 DIAGNOSIS — H35059 Retinal neovascularization, unspecified, unspecified eye: Secondary | ICD-10-CM

## 2014-01-07 ENCOUNTER — Ambulatory Visit (INDEPENDENT_AMBULATORY_CARE_PROVIDER_SITE_OTHER): Payer: 59 | Admitting: Ophthalmology

## 2014-01-07 DIAGNOSIS — H35059 Retinal neovascularization, unspecified, unspecified eye: Secondary | ICD-10-CM

## 2014-01-19 ENCOUNTER — Other Ambulatory Visit (INDEPENDENT_AMBULATORY_CARE_PROVIDER_SITE_OTHER): Payer: 59

## 2014-01-19 DIAGNOSIS — R5381 Other malaise: Secondary | ICD-10-CM

## 2014-01-19 DIAGNOSIS — R5383 Other fatigue: Secondary | ICD-10-CM

## 2014-01-19 DIAGNOSIS — I10 Essential (primary) hypertension: Secondary | ICD-10-CM

## 2014-01-19 DIAGNOSIS — E785 Hyperlipidemia, unspecified: Secondary | ICD-10-CM

## 2014-01-19 LAB — CBC WITH DIFFERENTIAL/PLATELET
Basophils Absolute: 0 10*3/uL (ref 0.0–0.1)
Basophils Relative: 0.2 % (ref 0.0–3.0)
Eosinophils Absolute: 0.2 10*3/uL (ref 0.0–0.7)
Eosinophils Relative: 2.1 % (ref 0.0–5.0)
HCT: 43 % (ref 36.0–46.0)
Hemoglobin: 14.2 g/dL (ref 12.0–15.0)
Lymphocytes Relative: 26 % (ref 12.0–46.0)
Lymphs Abs: 2.3 10*3/uL (ref 0.7–4.0)
MCHC: 33 g/dL (ref 30.0–36.0)
MCV: 89.1 fl (ref 78.0–100.0)
Monocytes Absolute: 0.7 10*3/uL (ref 0.1–1.0)
Monocytes Relative: 8.3 % (ref 3.0–12.0)
Neutro Abs: 5.5 10*3/uL (ref 1.4–7.7)
Neutrophils Relative %: 63.4 % (ref 43.0–77.0)
Platelets: 319 10*3/uL (ref 150.0–400.0)
RBC: 4.82 Mil/uL (ref 3.87–5.11)
RDW: 14.6 % (ref 11.5–15.5)
WBC: 8.7 10*3/uL (ref 4.0–10.5)

## 2014-01-19 LAB — LIPID PANEL
Cholesterol: 180 mg/dL (ref 0–200)
HDL: 46.3 mg/dL (ref >39.00)
LDL Cholesterol: 76 mg/dL (ref 0–99)
Total CHOL/HDL Ratio: 4
Triglycerides: 288 mg/dL — ABNORMAL HIGH (ref 0.0–149.0)
VLDL: 57.6 mg/dL — ABNORMAL HIGH (ref 0.0–40.0)

## 2014-01-19 LAB — BASIC METABOLIC PANEL
BUN: 13 mg/dL (ref 6–23)
CO2: 30 mEq/L (ref 19–32)
Calcium: 9.6 mg/dL (ref 8.4–10.5)
Chloride: 98 mEq/L (ref 96–112)
Creatinine, Ser: 0.7 mg/dL (ref 0.4–1.2)
GFR: 97.99 mL/min (ref >60.00)
Glucose, Bld: 102 mg/dL — ABNORMAL HIGH (ref 70–99)
Potassium: 3.3 mEq/L — ABNORMAL LOW (ref 3.5–5.1)
Sodium: 140 mEq/L (ref 135–145)

## 2014-01-19 LAB — HEPATIC FUNCTION PANEL
ALT: 33 U/L (ref 0–35)
AST: 27 U/L (ref 0–37)
Albumin: 4.2 g/dL (ref 3.5–5.2)
Alkaline Phosphatase: 55 U/L (ref 39–117)
Bilirubin, Direct: 0 mg/dL (ref 0.0–0.3)
Total Bilirubin: 0.4 mg/dL (ref 0.2–1.2)
Total Protein: 7.5 g/dL (ref 6.0–8.3)

## 2014-01-19 LAB — TSH: TSH: 3.78 u[IU]/mL (ref 0.35–4.50)

## 2014-01-26 ENCOUNTER — Ambulatory Visit (INDEPENDENT_AMBULATORY_CARE_PROVIDER_SITE_OTHER): Payer: 59 | Admitting: Family Medicine

## 2014-01-26 ENCOUNTER — Encounter: Payer: Self-pay | Admitting: Family Medicine

## 2014-01-26 VITALS — BP 124/76 | HR 72 | Temp 98.1°F | Ht 64.69 in | Wt 221.8 lb

## 2014-01-26 DIAGNOSIS — L918 Other hypertrophic disorders of the skin: Secondary | ICD-10-CM

## 2014-01-26 DIAGNOSIS — Z0000 Encounter for general adult medical examination without abnormal findings: Secondary | ICD-10-CM

## 2014-01-26 DIAGNOSIS — L919 Hypertrophic disorder of the skin, unspecified: Secondary | ICD-10-CM

## 2014-01-26 DIAGNOSIS — Z853 Personal history of malignant neoplasm of breast: Secondary | ICD-10-CM

## 2014-01-26 DIAGNOSIS — L909 Atrophic disorder of skin, unspecified: Secondary | ICD-10-CM

## 2014-01-26 MED ORDER — FLUTICASONE PROPIONATE 50 MCG/ACT NA SUSP: 1.0000 | Freq: Every day | NASAL | Status: DC

## 2014-01-26 MED ORDER — DICLOFENAC SODIUM 75 MG PO TBEC: 75.0000 mg | DELAYED_RELEASE_TABLET | Freq: Two times a day (BID) | ORAL | Status: DC

## 2014-01-26 MED ORDER — FLUOXETINE HCL 20 MG PO TABS: ORAL_TABLET | ORAL | Status: DC

## 2014-01-26 MED ORDER — CHLORTHALIDONE 25 MG PO TABS: ORAL_TABLET | ORAL | Status: DC

## 2014-01-26 MED ORDER — ATORVASTATIN CALCIUM 20 MG PO TABS: 20.0000 mg | ORAL_TABLET | Freq: Every day | ORAL | Status: DC

## 2014-01-26 MED ORDER — LOSARTAN POTASSIUM 50 MG PO TABS: 50.0000 mg | ORAL_TABLET | Freq: Every day | ORAL | Status: DC

## 2014-01-26 NOTE — Progress Notes (Signed)
Pre visit review using our clinic review tool, if applicable. No additional management support is needed unless otherwise documented below in the visit note. 

## 2014-01-26 NOTE — Progress Notes (Signed)
[place] [address] Phone: [phone] Fax: [phone]  Patient ID: [name] [name] MRN: [id number], DOB: [birth year], 63 y.o. Date of Encounter: 01/26/2014  Primary Physician:  Owens Loffler, MD   Chief Complaint: Annual Exam  Subjective:   History of Present Illness:  [name] [name] is a 63 y.o. pleasant patient who presents with the following:  Health Maintenance Summary Reviewed and updated, unless pt declines services.  Tobacco History Reviewed. Non-smoker Alcohol: No concerns, no excessive use Exercise Habits: Some activity, rec at least 30 mins 5 times a week STD concerns: none Drug Use: None Birth control method: Menses regular: yes Lumps or breast concerns: no Breast Cancer Family History: personal, s/p 10 years  Health Maintenance  Topic Date Due  . Influenza Vaccine  04/11/2014  . Mammogram  02/20/2015  . Colonoscopy  05/09/2016  . Tetanus/tdap  08/31/2020  . Zostavax  Addressed   Hurts on the left and on the back - 2006, still have been hurting. Hurting over time. In the front, hurts more.   Skin tags on the left forearm. Anterior. Painful and irritated  Hysterectomy, tubal scarring.   Shingles vaccine.   Mammos.  Skin tag procedure.  Immunization History  Administered Date(s) Administered  . Influenza Split 07/10/2011  . Influenza Whole 08/11/2008, 10/11/2009, 08/31/2010  . Influenza,inj,Quad PF,36+ Mos 05/21/2013  . Td 08/31/2010    Patient Active Problem List   Diagnosis Date Noted  . Chronic diastolic heart failure 0000000  . Coronary artery disease   . OTHER MALAISE AND FATIGUE 12/28/2008  . ROTATOR CUFF SYNDROME, LEFT 12/10/2008  . HYPERLIPIDEMIA 09/28/2008  . HYPERTENSION 09/28/2008  . ALLERGIC RHINITIS 09/28/2008  . OSTEOARTHRITIS 09/28/2008  . BREAST CANCER, HX OF 09/28/2008   Past Medical History  Diagnosis Date  . HLD (hyperlipidemia)   . HTN (hypertension)   . Allergic rhinitis   .  Osteoarthritis   . Osteopenia   . History of hepatitis B     ?--tested + by [employer]  . Avulsion fracture of distal fibula 3/10    and sprain; Left  . Breast cancer     history of  . Coronary artery disease     Cardiac catheterization in April of 2014 at [facility]: 50% mid RCA stenosis and mild proximal disease in the left circumflex. Ejection fraction was 50%.   Past Surgical History  Procedure Laterality Date  . Tonsillectomy  1958  . Mastectomy  2004  . Colonoscopy  8/09    Hyperplastic polyp; repeat in 10 years  . Hip fracture surgery  06/14/2011    ball replaced (Dr. Earvin Hansen)  . Breast surgery  10/2002  . Abdominal hysterectomy  1985    endometriosis; anatomy  . Cardiac catheterization  12/19/12    [facility]; EF 50%   History   Social History  . Marital Status: Married    Spouse Name: N/A    Number of Children: N/A  . Years of Education: N/A   Occupational History  . Not on file.   Social History Main Topics  . Smoking status: Former Smoker -- 0.50 packs/day for 10 years    Types: Cigarettes  . Smokeless tobacco: Former Systems developer    Quit date: 01/24/1986  . Alcohol Use: No  . Drug Use: No  . Sexual Activity: Not on file   Other Topics Concern  . Not on file   Social History Narrative   Married      [facility]  No regular exercise   Family History  Problem Relation Age of Onset  . Ovarian cancer      Aunt  . Diabetes      GP  . Breast cancer Sister 71  . Cancer Sister     breast  . Heart attack Father 65  . Heart disease Father   . Sarcoidosis Mother   . Cancer Paternal Aunt     colon/ovarian   Allergies  Allergen Reactions  . Ace Inhibitors     REACTION: Cough  . Codeine     Hallucinations  . Morphine   . Sulfonamide Derivatives    Medication list has been reviewed and updated.  Review of Systems:   General: Denies fever, chills, sweats. No significant weight loss. Eyes: Denies blurring,significant itching ENT: Denies earache, sore throat,  and hoarseness.  Cardiovascular: Denies chest pains, palpitations, dyspnea on exertion,  Respiratory: Denies cough, dyspnea at rest,wheeezing Breast: no concerns about lumps GI: Denies nausea, vomiting, diarrhea, constipation, change in bowel habits, abdominal pain, melena, hematochezia GU: Denies dysuria, hematuria, urinary hesitancy, nocturia, denies STD risk, no concerns about discharge Musculoskeletal: Denies back pain, joint pain Derm: Denies rash, itching Neuro: Denies  paresthesias, frequent falls, frequent headaches Psych: Denies depression, anxiety Endocrine: Denies cold intolerance, heat intolerance, polydipsia Heme: Denies enlarged lymph nodes Allergy: No hayfever  Objective:   Physical Examination: BP 124/76  Pulse 72  Temp(Src) 98.1 F (36.7 C) (Oral)  Ht 5' 4.69" (1.643 m)  Wt 221 lb 12 oz (100.585 kg)  BMI 37.26 kg/m2  GEN: well developed, well nourished, no acute distress Eyes: conjunctiva and lids normal, PERRLA, EOMI ENT: TM clear, nares clear, oral exam WNL Neck: supple, no lymphadenopathy, no thyromegaly, no JVD Pulm: clear to auscultation and percussion, respiratory effort normal CV: regular rate and rhythm, S1-S2, no murmur, rub or gallop, no bruits Chest: no scars, masses, no lumps BREAST: breast exam normal on R. Chaperoned examination. Entirety of breast examined including axilla, and no enlarged lymph nodes. No significant masses are felt. No nipple discharge. No skin changes. Overall, reassuring breast exam. LEFT breast is s/p mastectomy with comensurate changes. This portion of the physical examination was chaperoned by Hedy Camara, CMA.  GI: soft, non-tender; no hepatosplenomegaly, masses; active bowel sounds all quadrants GU: GU exam declined Lymph: no cervical, axillary or inguinal adenopathy MSK: gait normal, muscle tone and strength WNL, no joint swelling, effusions, discoloration, crepitus  SKIN: clear, good turgor, color WNL, no rashes,  lesions, or ulcerations Neuro: normal mental status, normal strength, sensation, and motion Psych: alert; oriented to person, place and time, normally interactive and not anxious or depressed in appearance.   All labs reviewed with patient. Lipids:    Component Value Date/Time   CHOL 180 01/19/2014 0823   TRIG 288.0* 01/19/2014 0823   HDL 46.30 01/19/2014 0823   LDLDIRECT 56.7 10/31/2012 1021   VLDL 57.6* 01/19/2014 0823   CHOLHDL 4 01/19/2014 0823   CBC:    Component Value Date/Time   WBC 8.7 01/19/2014 0823   WBC 9.0 12/16/2012 1100   HGB 14.2 01/19/2014 0823   HCT 43.0 01/19/2014 0823   PLT 319.0 01/19/2014 0823   MCV 89.1 01/19/2014 0823   NEUTROABS 5.5 01/19/2014 0823   NEUTROABS 5.4 12/16/2012 1100   LYMPHSABS 2.3 01/19/2014 0823   LYMPHSABS 2.4 12/16/2012 1100   MONOABS 0.7 01/19/2014 0823   EOSABS 0.2 01/19/2014 0823   EOSABS 0.2 12/16/2012 1100   BASOSABS 0.0 01/19/2014 4098  BASOSABS 0.0 12/16/2012 2440   Basic Metabolic Panel:    Component Value Date/Time   NA 140 01/19/2014 0823   NA 142 12/16/2012 1100   K 3.3* 01/19/2014 0823   CL 98 01/19/2014 0823   CO2 30 01/19/2014 0823   BUN 13 01/19/2014 0823   BUN 13 12/16/2012 1100   CREATININE 0.7 01/19/2014 0823   GLUCOSE 102* 01/19/2014 0823   GLUCOSE 118* 12/16/2012 1100   CALCIUM 9.6 01/19/2014 0823   Lab Results  Component Value Date   ALT 33 01/19/2014   AST 27 01/19/2014   ALKPHOS 55 01/19/2014   BILITOT 0.4 01/19/2014   Lab Results  Component Value Date   TSH 3.78 01/19/2014    No results found.  Assessment & Plan:   Routine general medical examination at a health care facility  BREAST CANCER, HX OF  Skin tag   Basically, o/w doing well.  S: The patient complains of symptomatic skin tags on the L FOREARM. These are irritated by clothing, jewelry and rubbing.  O: Patient appears well. 1 skin tags are noted on the L anterior forarm.   A: Skin tags   P: Skin tags are snipped off using alcohol for cleansing and sterile  iris scissors. Local anesthesia was not used. These pathognomonic lesions are not sent for pathology.  Health Maintenance Exam: The patient's preventative maintenance and recommended screening tests for an annual wellness exam were reviewed in full today. Brought up to date unless services declined.  Counselled on the importance of diet, exercise, and its role in overall health and mortality. The patient's FH and SH was reviewed, including their home life, tobacco status, and drug and alcohol status.  Follow-up: Return in about 1 year (around 01/27/2015). Or follow-up in 1 year for complete physical examination  New Prescriptions   No medications on file   No orders of the defined types were placed in this encounter.    Signed,  Maud Deed. Omran Keelin, MD, [place]   Patient's Medications  New Prescriptions   No medications on file  Previous Medications   ASPIRIN 81 MG TABLET    Take 81 mg by mouth daily.     CALCIUM CITRATE-VITAMIN D (CITRACAL + D PO)    Take by mouth.   CARVEDILOL (COREG) 12.5 MG TABLET    Take 1 tablet (12.5 mg total) by mouth 2 (two) times daily.   GRAPE SEED EXTRACT PO    Take by mouth. Takes 2 tablets daily.   MISC NATURAL PRODUCTS (OSTEO BI-FLEX ADV DOUBLE ST) TABS    Take 2 tablets by mouth daily.     MULTIPLE VITAMIN (MULTIVITAMIN) TABLET    Take 1 tablet by mouth daily.     MULTIPLE VITAMINS-MINERALS (VISION FORMULA) TABS    Take 1 tablet by mouth daily.    OVER THE COUNTER MEDICATION    Mega Red, take one by mouth daily  Modified Medications   Modified Medication Previous Medication   ATORVASTATIN (LIPITOR) 20 MG TABLET atorvastatin (LIPITOR) 20 MG tablet      Take 1 tablet (20 mg total) by mouth at bedtime.    Take 1 tablet by mouth  every night at bedtime   CHLORTHALIDONE (HYGROTON) 25 MG TABLET chlorthalidone (HYGROTON) 25 MG tablet      Take 1 tablet by mouth  daily    Take 1 tablet by mouth  daily   DICLOFENAC (VOLTAREN) 75 MG EC TABLET  diclofenac (VOLTAREN) 75 MG EC tablet  Take 1 tablet (75 mg total) by mouth 2 (two) times daily.    Take 1 tablet (75 mg total) by mouth 2 (two) times daily.   FLUOXETINE (PROZAC) 20 MG TABLET FLUoxetine (PROZAC) 20 MG tablet      Take 1 tablet by mouth  daily    Take 1 tablet by mouth  daily   FLUTICASONE (FLONASE) 50 MCG/ACT NASAL SPRAY fluticasone (FLONASE) 50 MCG/ACT nasal spray      Place 1 spray into both nostrils daily.    Place into the nose as needed.    LOSARTAN (COZAAR) 50 MG TABLET losartan (COZAAR) 50 MG tablet      Take 1 tablet (50 mg total) by mouth daily.    Take 1 tablet (50 mg total) by mouth daily.  Discontinued Medications   No medications on file

## 2014-02-06 ENCOUNTER — Encounter (INDEPENDENT_AMBULATORY_CARE_PROVIDER_SITE_OTHER): Payer: 59 | Admitting: Ophthalmology

## 2014-02-06 DIAGNOSIS — H35059 Retinal neovascularization, unspecified, unspecified eye: Secondary | ICD-10-CM

## 2014-02-09 ENCOUNTER — Other Ambulatory Visit: Payer: Self-pay

## 2014-02-09 DIAGNOSIS — Z9012 Acquired absence of left breast and nipple: Secondary | ICD-10-CM

## 2014-02-09 DIAGNOSIS — Z1231 Encounter for screening mammogram for malignant neoplasm of breast: Secondary | ICD-10-CM

## 2014-02-20 ENCOUNTER — Ambulatory Visit
Admission: RE | Admit: 2014-02-20 | Discharge: 2014-02-20 | Disposition: A | Discharge status: Home / Self Care | Payer: 59 | Source: Ambulatory Visit

## 2014-02-20 DIAGNOSIS — Z9012 Acquired absence of left breast and nipple: Secondary | ICD-10-CM

## 2014-02-20 DIAGNOSIS — Z1231 Encounter for screening mammogram for malignant neoplasm of breast: Secondary | ICD-10-CM

## 2014-03-06 ENCOUNTER — Encounter (INDEPENDENT_AMBULATORY_CARE_PROVIDER_SITE_OTHER): Payer: 59 | Admitting: Ophthalmology

## 2014-03-06 DIAGNOSIS — H35059 Retinal neovascularization, unspecified, unspecified eye: Secondary | ICD-10-CM

## 2014-04-25 ENCOUNTER — Other Ambulatory Visit: Payer: Self-pay | Admitting: Cardiovascular Disease

## 2014-06-08 ENCOUNTER — Encounter (INDEPENDENT_AMBULATORY_CARE_PROVIDER_SITE_OTHER): Payer: 59 | Admitting: Ophthalmology

## 2014-06-08 DIAGNOSIS — H35059 Retinal neovascularization, unspecified, unspecified eye: Secondary | ICD-10-CM

## 2014-06-08 DIAGNOSIS — H2510 Age-related nuclear cataract, unspecified eye: Secondary | ICD-10-CM

## 2014-06-08 DIAGNOSIS — H43819 Vitreous degeneration, unspecified eye: Secondary | ICD-10-CM

## 2014-06-08 DIAGNOSIS — I10 Essential (primary) hypertension: Secondary | ICD-10-CM

## 2014-06-08 DIAGNOSIS — H35039 Hypertensive retinopathy, unspecified eye: Secondary | ICD-10-CM

## 2014-06-08 DIAGNOSIS — H3530 Unspecified macular degeneration: Secondary | ICD-10-CM

## 2014-09-02 ENCOUNTER — Ambulatory Visit (INDEPENDENT_AMBULATORY_CARE_PROVIDER_SITE_OTHER): Payer: 59 | Admitting: Family Medicine

## 2014-09-02 ENCOUNTER — Encounter: Payer: Self-pay | Admitting: Family Medicine

## 2014-09-02 VITALS — BP 120/70 | HR 74 | Temp 98.5°F | Ht 64.69 in | Wt 219.5 lb

## 2014-09-02 DIAGNOSIS — R229 Localized swelling, mass and lump, unspecified: Secondary | ICD-10-CM

## 2014-09-02 DIAGNOSIS — B079 Viral wart, unspecified: Secondary | ICD-10-CM

## 2014-09-02 NOTE — Progress Notes (Signed)
Pre visit review using our clinic review tool, if applicable. No additional management support is needed unless otherwise documented below in the visit note. 

## 2014-09-02 NOTE — Progress Notes (Signed)
Dr. Frederico Hamman T. Lamarr Feenstra, MD, [facility] [place], [address] Phone: [phone] Fax: [phone]  09/02/2014  Patient: [name] [name], MRN: [id number], DOB: 01/21/1951, 63 y.o.  Primary Physician:  Owens Loffler, MD  Chief Complaint: Skin lesion  Subjective:   [name] [name] is a 63 y.o. very pleasant female patient who presents with the following:  L toe and She has had no specific injury to this area between her 4th and 5th toes, but she has had a expanding lesion in this area for at least a month.  Her husband tells me it is approximately 2 months.  It does not hurt.  R 2nd finger. Tiny wart. This is been there for at least a few weeks or longer than that, and it is a little bit painful.  She tried placing some Compound W on this, and it turned the skin around her lesion somewhat white.  Past Medical History, Surgical History, Social History, Family History, Problem List, Medications, and Allergies have been reviewed and updated if relevant.   GEN: No acute illnesses, no fevers, chills. GI: No n/v/d, eating normally Pulm: No SOB Interactive and getting along well at home.  Otherwise, ROS is as per the HPI.  Objective:   BP 120/70 mmHg  Pulse 74  Temp(Src) 98.5 F (36.9 C) (Oral)  Ht 5' 4.69" (1.643 m)  Wt 219 lb 8 oz (99.565 kg)  BMI 36.88 kg/m2  GEN: WDWN, NAD, Non-toxic, A & O x 3 HEENT: Atraumatic, Normocephalic. Neck supple. No masses, No LAD. Ears and Nose: No external deformity. CV: RRR, No M/G/R. No JVD. No thrill. No extra heart sounds. PULM: CTA B, no wheezes, crackles, rhonchi. No retractions. No resp. distress. No accessory muscle use. EXTR: No c/c/e NEURO Normal gait.  PSYCH: Normally interactive. Conversant. Not depressed or anxious appearing.  Calm demeanor.  Skin: On the RIGHT 2nd digit and one of the volar creases, the patient has a small wart  On the dorsum of her LEFT toe between  the 4th and 5th toes, the patient has a lesion that is approximately the size of a nickel which is almost fungating in appearance with elevation above the baseline skin and wet in appearance  Laboratory and Imaging Data:  Assessment and Plan:   Skin mass - Plan: Ambulatory referral to Dermatology  Wart viral  I have no concerns about the ward, offered to freeze this off for the patient, and at this point she is going to use Compound W for a while, which I think is reasonable.  I had no idea what the lesion is between her toes, but it is large, growing, and has an unusual appearance.  I had one of my partners look at this lesion additionally, and we both felt that she should go to dermatology to have this definitively evaluated.  Follow-up: No Follow-up on file.  New Prescriptions   No medications on file   Orders Placed This Encounter  Procedures  . Ambulatory referral to Dermatology    Signed,  Frederico Hamman T. Anayiah Howden, MD   Patient's Medications  New Prescriptions   No medications on file  Previous Medications   ASPIRIN 81 MG TABLET    Take 81 mg by mouth daily.     ATORVASTATIN (LIPITOR) 20 MG TABLET    Take 1 tablet (20 mg total) by mouth at bedtime.   CALCIUM CITRATE-VITAMIN D (CITRACAL + D PO)    Take  by mouth.   CARVEDILOL (COREG) 12.5 MG TABLET    Take 1 tablet by mouth two  times daily   CHLORTHALIDONE (HYGROTON) 25 MG TABLET    Take 1 tablet by mouth  daily   DICLOFENAC (VOLTAREN) 75 MG EC TABLET    Take 1 tablet (75 mg total) by mouth 2 (two) times daily.   FLUOXETINE (PROZAC) 20 MG TABLET    Take 1 tablet by mouth  daily   FLUTICASONE (FLONASE) 50 MCG/ACT NASAL SPRAY    Place 1 spray into both nostrils daily.   GRAPE SEED EXTRACT PO    Take by mouth. Takes 2 tablets daily.   LOSARTAN (COZAAR) 50 MG TABLET    Take 1 tablet (50 mg total) by mouth daily.   MISC NATURAL PRODUCTS (OSTEO BI-FLEX ADV DOUBLE ST) TABS    Take 2 tablets by mouth daily.     MULTIPLE VITAMIN  (MULTIVITAMIN) TABLET    Take 1 tablet by mouth daily.     MULTIPLE VITAMINS-MINERALS (VISION FORMULA) TABS    Take 1 tablet by mouth daily.    OVER THE COUNTER MEDICATION    Mega Red, take one by mouth daily  Modified Medications   No medications on file  Discontinued Medications   No medications on file

## 2014-09-02 NOTE — Patient Instructions (Signed)

## 2014-10-04 ENCOUNTER — Other Ambulatory Visit: Payer: Self-pay | Admitting: Cardiovascular Disease

## 2014-12-14 ENCOUNTER — Ambulatory Visit (INDEPENDENT_AMBULATORY_CARE_PROVIDER_SITE_OTHER): Payer: 59 | Admitting: Ophthalmology

## 2014-12-14 DIAGNOSIS — H2513 Age-related nuclear cataract, bilateral: Secondary | ICD-10-CM

## 2014-12-14 DIAGNOSIS — H43813 Vitreous degeneration, bilateral: Secondary | ICD-10-CM | POA: Diagnosis not present

## 2014-12-14 DIAGNOSIS — I10 Essential (primary) hypertension: Secondary | ICD-10-CM | POA: Diagnosis not present

## 2014-12-14 DIAGNOSIS — H3531 Nonexudative age-related macular degeneration: Secondary | ICD-10-CM | POA: Diagnosis not present

## 2014-12-14 DIAGNOSIS — H35033 Hypertensive retinopathy, bilateral: Secondary | ICD-10-CM | POA: Diagnosis not present

## 2015-01-01 ENCOUNTER — Other Ambulatory Visit: Payer: Self-pay | Admitting: Family Medicine

## 2015-01-01 NOTE — Telephone Encounter (Signed)
Please schedule CPE with fasting labs for Dr. Lorelei Pont for sometime in the next three months. (After 01/27/2015).

## 2015-01-02 ENCOUNTER — Other Ambulatory Visit: Payer: Self-pay | Admitting: Family Medicine

## 2015-01-25 ENCOUNTER — Telehealth: Payer: Self-pay | Admitting: *Deleted

## 2015-01-25 ENCOUNTER — Ambulatory Visit: Payer: 59 | Admitting: Family Medicine

## 2015-01-25 NOTE — Telephone Encounter (Signed)
Whitley with [facility] [phone]) called stating that they have received an order from [facility] and they plan on starting that today. Kerri Perches wants to know if you will follow patient and sign orders for this patient?  Called Whitley back to get more information and was advised that she just talked with the patient's sister and was advised that patient will be followed by Dr. 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

## 2015-01-25 NOTE — Telephone Encounter (Signed)
It is more appropriate for general surgery to follow in this case - and this is in their scope of care. Agree.

## 2015-03-22 ENCOUNTER — Ambulatory Visit: Payer: 59 | Attending: Surgery | Admitting: Physical Therapy

## 2015-03-22 DIAGNOSIS — I890 Lymphedema, not elsewhere classified: Secondary | ICD-10-CM | POA: Diagnosis present

## 2015-03-22 NOTE — Therapy (Signed)
[place], [place], [address] Phone: [phone]   Fax:  [phone]  Physical Therapy Evaluation  Patient Details  Name: [name] [name] MRN: [id number] Date of Birth: 1951/08/07 Referring Provider:  Rickard Patience, MD  Encounter Date: 03/22/2015      PT End of Session - 03/22/15 1212    Visit Number 1   Number of Visits 5   Date for PT Re-Evaluation 04/21/15   PT Start Time 0855   PT Stop Time 0938   PT Time Calculation (min) 43 min   Activity Tolerance Patient tolerated treatment well   Behavior During Therapy [facility] for tasks assessed/performed      Past Medical History  Diagnosis Date  . HLD (hyperlipidemia)   . HTN (hypertension)   . Allergic rhinitis   . Osteoarthritis   . Osteopenia   . History of hepatitis B     ?--tested + by [employer]  . Avulsion fracture of distal fibula 3/10    and sprain; Left  . Breast cancer     history of  . Coronary artery disease     Cardiac catheterization in April of 2014 at [facility]: 50% mid RCA stenosis and mild proximal disease in the left circumflex. Ejection fraction was 50%.    Past Surgical History  Procedure Laterality Date  . Tonsillectomy  1958  . Mastectomy  2004  . Colonoscopy  8/09    Hyperplastic polyp; repeat in 10 years  . Hip fracture surgery  06/14/2011    ball replaced (Dr. Earvin Hansen)  . Breast surgery  10/2002  . Abdominal hysterectomy  1985    endometriosis; anatomy  . Cardiac catheterization  12/19/12    [facility]; EF 50%    There were no vitals filed for this visit.  Visit Diagnosis:  Lymphedema of left lower extremity - Plan: PT plan of care cert/re-cert      Subjective Assessment - 03/22/15 0900    Subjective Husband passed away Feb 03, 2023, was buried on 5/10, and patient had surgery for melanoma on 01/20/15.   Pertinent History 13 year breast cancer survivor.  Melanoma on left foot toes 4 and 5; toes were amputated 10/27/14.   Inguinal nodes removed (several) in early April; in May had surgery for a staph infection in the groin.  No other treatment planned for this.   Physician noticed swelling and referred her.  HTN controlled with meds.  No other health issues.  Right total hip replacement approx. 2012 due to hip fracture.  Left breast cancer with mastectomy and no more thatn a few lymph nodes removed, if any; no other treatment for that.                                                              Patient Stated Goals avoid having to rely on compression stockings all the time   Currently in Pain? No/denies            [facility] PT Assessment - 03/22/15 0001    Assessment   Medical Diagnosis melanoma left foot; lymphedema left leg   Onset Date/Surgical Date 02/17/15   Precautions   Precautions Other (comment)  cancer precautions   Restrictions   Weight Bearing Restrictions No   Balance Screen  Has the patient fallen in the past 6 months No   Has the patient had a decrease in activity level because of a fear of falling?  No   Is the patient reluctant to leave their home because of a fear of falling?  No   Home Environment   Living Environment Private residence   Living Arrangements Other relatives  brother   Available Help at Discharge Family   Type of [place] level   Prior Function   Level of [place] Retired   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   Leisure hasn't been doing regular exercise this year; has belonged to [employer] in the past and wants to go back to that; walks her small dog several times a day   Cognition   Overall Cognitive Status Within Functional Limits for tasks assessed   Observation/Other Assessments   Observations visible swelling left leg   Skin Integrity Lateral distal foot where toes were amputated still has a scab in place, but otherwise looks good.    Other Surveys  --  Lymphedema Life Impact Score = 7   ROM / Strength   AROM / PROM / Strength AROM   AROM   Overall AROM Comments Both shoulders and both LEs grossly [facility]   Ambulation/Gait   Ambulation/Gait Yes   Ambulation/Gait Assistance 7: Independent           LYMPHEDEMA/ONCOLOGY QUESTIONNAIRE - 03/22/15 0907    Type   Cancer Type melanoma in left 4th and 5th toes   Surgeries   Mastectomy Date --  approx. 2003   Other Surgery Date 10/27/14  toe amputation   Number Lymph Nodes Removed --  several, in April 2016   What other symptoms do you have   Are you Having Heaviness or Tightness No   Are you having Pain No   Lymphedema Stage   Stage STAGE 2 SPONTANEOUSLY IRREVERSIBLE   Lymphedema Assessments   Lymphedema Assessments Lower extremities   Right Lower Extremity Lymphedema   20 cm Proximal to Suprapatella 62.7 cm   10 cm Proximal to Suprapatella 53.7 cm   At Midpatella/Popliteal Crease 46 cm   30 cm Proximal to Floor at Lateral Plantar Foot 41.1 cm   20 cm Proximal to Floor at Lateral Plantar Foot 30.7 1   10  cm Proximal to Floor at Lateral Malleoli 25.9 cm   5 cm Proximal to 1st MTP Joint 22.5 cm   Around Proximal Great Toe 7.5 cm   Other 40 cm. proximal to floor, 44.8 cm.   Left Lower Extremity Lymphedema   20 cm Proximal to Suprapatella 63.3 cm   10 cm Proximal to Suprapatella 58.1 cm   At Midpatella/Popliteal Crease 48.2 cm   30 cm Proximal to Floor at Lateral Plantar Foot 44.2 cm   20 cm Proximal to Floor at Lateral Plantar Foot 32.6 cm   10 cm Proximal to Floor at Lateral Malleoli 29.2 cm   5 cm Proximal to 1st MTP Joint 23.2 cm   Around Proximal Great Toe 7.9 cm   Other 40 cm. proximal to floor, 48.2 cm.                        PT Education - 03/22/15 1212    Education provided Yes   Education Details treatment options for lymphedema  of leg   Person(s) Educated Patient   Methods Explanation   Comprehension Verbalized understanding                 [facility] Goals - 03/22/15 1219    CC Long Term Goal  #1   Title knowledgeable about self-manual lymph drainage and able to perform it or sister able to perform it   Time 4   Period Weeks   Status New   CC Long Term Goal  #2   Title knowledgeable about lymphedema risk reduction   Time 4   Period Weeks   Status New   CC Long Term Goal  #3   Title knowledgeable about compression garment options (day- and nighttime), and where and how to obtain these   Time 4   Period Weeks   Status New            Plan - 03/22/15 1213    Clinical Impression Statement Patient with left LE lymphedema s/p amputation of 4th and 5th toes of left foot for melanoma and s/p inguinal lymph node dissection.  Lymphedema is currently mild to moderate.  Patient wants to learn to manage swelling and not have to wear compression stockings all the time; she does not choose bandaging at this time and wants to get the treatment she needs while minimizing visits if possible.                                                           Pt will benefit from skilled therapeutic intervention in order to improve on the following deficits Increased edema;Decreased knowledge of use of DME   Rehab Potential Excellent   PT Frequency 2x / week   PT Duration 2 weeks  (4 visits total at frequency that works for scheduling)   PT Treatment/Interventions Manual lymph drainage;Patient/family education;ADLs/Self Care Home Management;DME Instruction   PT Next Visit Plan Begin manual lymph drainage of left leg and instructing patient and/or her sister in performing this at home; later, show patient nighttime compression garment options; work with patient on obtaining day- and/or nighttime garments; lymphedema risk reduction education.   Recommended Other Services Garment vendor for day- and/or nighttime garments.   Consulted and Agree with Plan of Care Patient         Problem List Patient Active  Problem List   Diagnosis Date Noted  . Chronic diastolic heart failure 18/29/9371  . Coronary artery disease   . OTHER MALAISE AND FATIGUE 12/28/2008  . ROTATOR CUFF SYNDROME, LEFT 12/10/2008  . HYPERLIPIDEMIA 09/28/2008  . HYPERTENSION 09/28/2008  . ALLERGIC RHINITIS 09/28/2008  . OSTEOARTHRITIS 09/28/2008  . DCIS (ductal carcinoma in situ), LEFT, remission 09/28/2008    SALISBURY,DONNA 03/22/2015, 12:22 PM  [place], [place], [address] Phone: [phone]   Fax:  [place], PT 03/22/2015 12:22 PM

## 2015-03-30 ENCOUNTER — Ambulatory Visit: Payer: 59

## 2015-03-30 DIAGNOSIS — I890 Lymphedema, not elsewhere classified: Secondary | ICD-10-CM | POA: Diagnosis not present

## 2015-03-30 NOTE — Therapy (Signed)
[place], [place], [address] Phone: [phone]   Fax:  [phone]  Physical Therapy Treatment  Patient Details  Name: [name] [name] MRN: [id number] Date of Birth: 1950-11-04 Referring Provider:  Rickard Patience, MD  Encounter Date: 03/30/2015      PT End of Session - 03/30/15 1200    Visit Number 2   Number of Visits 5   Date for PT Re-Evaluation 04/21/15   PT Start Time 1111   PT Stop Time 1157   PT Time Calculation (min) 46 min   Activity Tolerance Patient tolerated treatment well   Behavior During Therapy [facility] for tasks assessed/performed      Past Medical History  Diagnosis Date  . HLD (hyperlipidemia)   . HTN (hypertension)   . Allergic rhinitis   . Osteoarthritis   . Osteopenia   . History of hepatitis B     ?--tested + by [employer]  . Avulsion fracture of distal fibula 3/10    and sprain; Left  . Breast cancer     history of  . Coronary artery disease     Cardiac catheterization in April of 2014 at [facility]: 50% mid RCA stenosis and mild proximal disease in the left circumflex. Ejection fraction was 50%.    Past Surgical History  Procedure Laterality Date  . Tonsillectomy  1958  . Mastectomy  2004  . Colonoscopy  8/09    Hyperplastic polyp; repeat in 10 years  . Hip fracture surgery  06/14/2011    ball replaced (Dr. Earvin Hansen)  . Breast surgery  10/2002  . Abdominal hysterectomy  1985    endometriosis; anatomy  . Cardiac catheterization  12/19/12    [facility]; EF 50%    There were no vitals filed for this visit.  Visit Diagnosis:  Lymphedema of left lower extremity      Subjective Assessment - 03/30/15 1113    Subjective Not having any pain, it just gets uncomfortable when my leg swells.    Currently in Pain? No/denies                         [facility] Adult PT Treatment/Exercise - 03/30/15 0001    Manual Therapy   Manual Lymphatic Drainage (MLD) In  Supine: Short neck, superficial and deep abdominals, Rt inguinal and Lt axillary nodes, anterior inter-axillary and Lt inguino-axillary anastomosis and Lt UE from dorsal foot to lateral thigh instructing patient throughout.                 PT Education - 03/30/15 1159    Education provided Yes   Education Details Self manual lymph drainage   Person(s) Educated Patient   Methods Explanation;Demonstration;Handout   Comprehension Verbalized understanding;Need further instruction                [facility] Goals - 03/22/15 1219    CC Long Term Goal  #1   Title knowledgeable about self-manual lymph drainage and able to perform it or sister able to perform it   Time 4   Period Weeks   Status New   CC Long Term Goal  #2   Title knowledgeable about lymphedema risk reduction   Time 4   Period Weeks   Status New   CC Long Term Goal  #3   Title knowledgeable about compression garment options (day- and nighttime), and where and how to obtain these   Time 4  Period Weeks   Status New            Plan - 03/30/15 1200    Clinical Impression Statement Patient instructed in self manual lymph drainage while therapist performed today. She demonstrated good initial understanding of technique and sequence and asked appropriate questions throughout.   Pt will benefit from skilled therapeutic intervention in order to improve on the following deficits Increased edema;Decreased knowledge of use of DME   Rehab Potential Excellent   PT Frequency 2x / week   PT Treatment/Interventions Manual lymph drainage;Patient/family education;ADLs/Self Care Home Management;DME Instruction   PT Next Visit Plan Assess next visit. Continue manual lymph drainage of left leg and instructing patient and/or her sister in performing this at home; later, show patient nighttime compression garment options; work with patient on obtaining day- and/or nighttime garments; lymphedema risk reduction  education.        Problem List Patient Active Problem List   Diagnosis Date Noted  . Chronic diastolic heart failure 81/27/5170  . Coronary artery disease   . OTHER MALAISE AND FATIGUE 12/28/2008  . ROTATOR CUFF SYNDROME, LEFT 12/10/2008  . HYPERLIPIDEMIA 09/28/2008  . HYPERTENSION 09/28/2008  . ALLERGIC RHINITIS 09/28/2008  . OSTEOARTHRITIS 09/28/2008  . DCIS (ductal carcinoma in situ), LEFT, remission 09/28/2008    [name], PTA 03/30/2015, 12:03 PM  [place], [place], [address] Phone: [phone]   Fax:  [phone]

## 2015-03-30 NOTE — Patient Instructions (Addendum)
Deep Effective Breath   Standing, sitting, or laying down place both hands on the belly. Take a deep breath IN, expanding the belly; then breath OUT, contracting the belly. Repeat __5__ times. Do __2-3__ sessions per day and before each self massage.  http://gt2.exer.us/866   Copyright  VHI. All rights reserved.  Inguinal Nodes to Axilla - Clear   On involved side, at armpit, make _5__ in-place circles. Then from hip proceed in sections to armpit with stationary circles or pumps _5_ times, this is your pathway. Do _1__ time per day.  Next on right side at groin at panty line perform 5 in-place circles. Then from left hip to right hip perform pumps to create a second pathway.  Copyright  VHI. All rights reserved.  LEG: Knee to Hip - Clear   Pump up outer thigh of involved leg from knee to outer hip. Then do stationary circles from inner to outer thigh, then do outer thigh again. Next, interlace fingers behind knee IF ABLE and make in-place circles. Do _5_ times of each sequence.  Do _1__ time per day.  Copyright  VHI. All rights reserved.  LEG: Ankle to Hip Sweep   Hands on sides of ankle of involved leg, pump _5__ times up both sides of lower leg, then retrace steps up outer thigh to hip as before and back to pathway. Do _2-3_ times. Do __1_ time per day.  Copyright  VHI. All rights reserved.  FOOT: Dorsum of Foot and Toes Massage   One hand on top of foot make _5_ stationary circles or pumps, then either on top of toes or each individual toe do _5_ pumps. Then retrace all steps pumping back up both sides of lower leg, outer thigh, and then pathway. Finish with what you started with, _5_ circles at involved side arm pit. All _2-3_ times at each sequence. Do _1__ time per day.  Copyright  VHI. All rights reserved.

## 2015-04-01 ENCOUNTER — Ambulatory Visit: Payer: 59

## 2015-04-01 ENCOUNTER — Other Ambulatory Visit: Payer: Self-pay | Admitting: Family Medicine

## 2015-04-01 DIAGNOSIS — I890 Lymphedema, not elsewhere classified: Secondary | ICD-10-CM

## 2015-04-01 NOTE — Telephone Encounter (Signed)
Please call and schedule CPE with fasting labs with Dr. Lorelei Pont.

## 2015-04-01 NOTE — Therapy (Signed)
[place], [place], [address] Phone: [phone]   Fax:  [phone]  Physical Therapy Treatment  Patient Details  Name: [name] [name] MRN: [id number] Date of Birth: September 20, 1950 Referring Provider:  Rickard Patience, MD  Encounter Date: 04/01/2015      PT End of Session - 04/01/15 1021    Visit Number 3   Number of Visits 5   Date for PT Re-Evaluation 04/21/15   PT Start Time 0938   PT Stop Time 1022   PT Time Calculation (min) 44 min   Activity Tolerance Patient tolerated treatment well   Behavior During Therapy [facility] for tasks assessed/performed      Past Medical History  Diagnosis Date  . HLD (hyperlipidemia)   . HTN (hypertension)   . Allergic rhinitis   . Osteoarthritis   . Osteopenia   . History of hepatitis B     ?--tested + by [employer]  . Avulsion fracture of distal fibula 3/10    and sprain; Left  . Breast cancer     history of  . Coronary artery disease     Cardiac catheterization in April of 2014 at [facility]: 50% mid RCA stenosis and mild proximal disease in the left circumflex. Ejection fraction was 50%.    Past Surgical History  Procedure Laterality Date  . Tonsillectomy  1958  . Mastectomy  2004  . Colonoscopy  8/09    Hyperplastic polyp; repeat in 10 years  . Hip fracture surgery  06/14/2011    ball replaced (Dr. Earvin Hansen)  . Breast surgery  10/2002  . Abdominal hysterectomy  1985    endometriosis; anatomy  . Cardiac catheterization  12/19/12    [facility]; EF 50%    There were no vitals filed for this visit.  Visit Diagnosis:  Lymphedema of left lower extremity      Subjective Assessment - 04/01/15 0940    Subjective My Lt leg felt better after last visit. I tried the massage some last night.    Currently in Pain? No/denies               LYMPHEDEMA/ONCOLOGY QUESTIONNAIRE - 04/01/15 1013    Left Lower Extremity Lymphedema   20 cm Proximal to Suprapatella  61.2 cm  Taken in supine with legs in bolster   10 cm Proximal to Suprapatella 53.6 cm   At Midpatella/Popliteal Crease 45.8 cm   30 cm Proximal to Floor at Lateral Plantar Foot 44.1 cm   20 cm Proximal to Floor at Lateral Plantar Foot 33 cm   10 cm Proximal to Floor at Lateral Malleoli 27.7 cm   5 cm Proximal to 1st MTP Joint 22.5 cm   Around Proximal Great Toe 7.4 cm   Other 40 cm. proximal to floor, 46.2 cm.                  [facility] Adult PT Treatment/Exercise - 04/01/15 0001    Manual Therapy   Manual Lymphatic Drainage (MLD) In Supine: Short neck, superficial and deep abdominals, Rt inguinal and Lt axillary nodes, anterior inter-inguinal and Lt inguino-axillary anastomosis and Lt UE from dorsal foot to lateral thigh reviewing with patient throughout.                         [facility] Goals - 04/01/15 1116    CC Long Term Goal  #1   Title knowledgeable about self-manual lymph drainage and  able to perform it or sister able to perform it  Pt has been instructed in this and has started performing at home.    Status Partially Met   CC Long Term Goal  #2   Title knowledgeable about lymphedema risk reduction   Status On-going   CC Long Term Goal  #3   Title knowledgeable about compression garment options (day- and nighttime), and where and how to obtain these   Status On-going            Plan - 04/01/15 1022    Clinical Impression Statement Patient had great reductions with measurements this week and is able to correctly verbalize sequence of self manual lymph drainage. She is making good progress thus far. Pt interested at this time in only a nighttime compression garment.   Pt will benefit from skilled therapeutic intervention in order to improve on the following deficits Increased edema;Decreased knowledge of use of DME   Rehab Potential Excellent   PT Frequency 2x / week   PT Treatment/Interventions Manual lymph drainage;Patient/family  education;ADLs/Self Care Home Management;DME Instruction   PT Next Visit Plan Continue manual lymph drainage of left leg and instructing patient and/or her sister in performing this at home; later, show patient nighttime compression garment options; work with patient on obtaining day- and/or nighttime garments; lymphedema risk reduction education.   Recommended Other Services Discuss with pt pump vs. biacare possibly??   Consulted and Agree with Plan of Care Patient        Problem List Patient Active Problem List   Diagnosis Date Noted  . Chronic diastolic heart failure 44/92/0100  . Coronary artery disease   . OTHER MALAISE AND FATIGUE 12/28/2008  . ROTATOR CUFF SYNDROME, LEFT 12/10/2008  . HYPERLIPIDEMIA 09/28/2008  . HYPERTENSION 09/28/2008  . ALLERGIC RHINITIS 09/28/2008  . OSTEOARTHRITIS 09/28/2008  . DCIS (ductal carcinoma in situ), LEFT, remission 09/28/2008    Collie Siad Ann,PTA 04/01/2015, 12:13 PM  [place], [place], [address] Phone: [phone]   Fax:  [phone]

## 2015-04-02 ENCOUNTER — Other Ambulatory Visit: Payer: Self-pay | Admitting: Family Medicine

## 2015-04-05 ENCOUNTER — Ambulatory Visit: Payer: 59

## 2015-04-05 DIAGNOSIS — I890 Lymphedema, not elsewhere classified: Secondary | ICD-10-CM | POA: Diagnosis not present

## 2015-04-05 NOTE — Therapy (Signed)
[place], [place], [address] Phone: [phone]   Fax:  [phone]  Physical Therapy Treatment  Patient Details  Name: [name] [name] MRN: [id number] Date of Birth: 1951-02-22 Referring Provider:  Rickard Patience, MD  Encounter Date: 04/05/2015      PT End of Session - 04/05/15 1035    Visit Number 4   Number of Visits 5   Date for PT Re-Evaluation 04/21/15   PT Start Time 1022   PT Stop Time 1106   PT Time Calculation (min) 44 min   Activity Tolerance Patient tolerated treatment well   Behavior During Therapy [facility] for tasks assessed/performed      Past Medical History  Diagnosis Date  . HLD (hyperlipidemia)   . HTN (hypertension)   . Allergic rhinitis   . Osteoarthritis   . Osteopenia   . History of hepatitis B     ?--tested + by [employer]  . Avulsion fracture of distal fibula 3/10    and sprain; Left  . Breast cancer     history of  . Coronary artery disease     Cardiac catheterization in April of 2014 at [facility]: 50% mid RCA stenosis and mild proximal disease in the left circumflex. Ejection fraction was 50%.    Past Surgical History  Procedure Laterality Date  . Tonsillectomy  1958  . Mastectomy  2004  . Colonoscopy  8/09    Hyperplastic polyp; repeat in 10 years  . Hip fracture surgery  06/14/2011    ball replaced (Dr. Earvin Hansen)  . Breast surgery  10/2002  . Abdominal hysterectomy  1985    endometriosis; anatomy  . Cardiac catheterization  12/19/12    [facility]; EF 50%    There were no vitals filed for this visit.  Visit Diagnosis:  Lymphedema of left lower extremity      Subjective Assessment - 04/05/15 1023    Subjective My Lt thigh just feels really stiff today, it started yesterday. I think its the heat.    Currently in Pain? No/denies                         [facility] Adult PT Treatment/Exercise - 04/05/15 0001    Manual Therapy   Manual Lymphatic  Drainage (MLD) In Supine: Short neck, superficial and deep abdominals, Rt inguinal and Lt axillary nodes, anterior inter-inguinal and Lt inguino-axillary anastomosis and Lt UE from dorsal foot to lateral thigh reviewing with patient throughout.                         [facility] Goals - 04/01/15 1116    CC Long Term Goal  #1   Title knowledgeable about self-manual lymph drainage and able to perform it or sister able to perform it  Pt has been instructed in this and has started performing at home.    Status Partially Met   CC Long Term Goal  #2   Title knowledgeable about lymphedema risk reduction   Status On-going   CC Long Term Goal  #3   Title knowledgeable about compression garment options (day- and nighttime), and where and how to obtain these   Status On-going            Plan - 04/05/15 1029    Clinical Impression Statement Discussed options of pump vs biacare type garment and pt is interested in getting a garment as opposed  to a pump at this time because she wants to be able to wear something at night and can continue with self manual lyph drainage. Patient is doing well with Maintenance Phase of treatment as she has started incorporating self manual lymph drainage about 1x/day.    Pt will benefit from skilled therapeutic intervention in order to improve on the following deficits Increased edema;Decreased knowledge of use of DME   Rehab Potential Excellent   PT Frequency 2x / week   PT Treatment/Interventions Manual lymph drainage;Patient/family education;ADLs/Self Care Home Management;DME Instruction   PT Next Visit Plan Continue manual lymph drainage of left leg and instructing patient and/or her sister in performing this at home; lymphedema risk reduction education. Pt would like to renew for a few more weeks 1x/week   Recommended Other Services Faxed pts demographics to [place] and Agree with Plan of Care Patient        Problem  List Patient Active Problem List   Diagnosis Date Noted  . Chronic diastolic heart failure 25/52/5894  . Coronary artery disease   . OTHER MALAISE AND FATIGUE 12/28/2008  . ROTATOR CUFF SYNDROME, LEFT 12/10/2008  . HYPERLIPIDEMIA 09/28/2008  . HYPERTENSION 09/28/2008  . ALLERGIC RHINITIS 09/28/2008  . OSTEOARTHRITIS 09/28/2008  . DCIS (ductal carcinoma in situ), LEFT, remission 09/28/2008    [name], PTA 04/05/2015, 11:11 AM  [place], [place], [address] Phone: [phone]   Fax:  [phone]

## 2015-04-07 ENCOUNTER — Ambulatory Visit: Payer: 59

## 2015-04-07 DIAGNOSIS — I890 Lymphedema, not elsewhere classified: Secondary | ICD-10-CM

## 2015-04-07 NOTE — Therapy (Signed)
[place], [place], [address] Phone: [phone]   Fax:  [phone]  Physical Therapy Treatment  Patient Details  Name: [name] [name] MRN: [id number] Date of Birth: March 13, 1951 Referring Provider:  Rickard Patience, MD  Encounter Date: 04/07/2015      PT End of Session - 04/07/15 1154    Visit Number 5   Number of Visits 5   Date for PT Re-Evaluation 04/21/15   PT Start Time 1106   PT Stop Time 1150   PT Time Calculation (min) 44 min   Activity Tolerance Patient tolerated treatment well   Behavior During Therapy [facility] for tasks assessed/performed      Past Medical History  Diagnosis Date  . HLD (hyperlipidemia)   . HTN (hypertension)   . Allergic rhinitis   . Osteoarthritis   . Osteopenia   . History of hepatitis B     ?--tested + by [employer]  . Avulsion fracture of distal fibula 3/10    and sprain; Left  . Breast cancer     history of  . Coronary artery disease     Cardiac catheterization in April of 2014 at [facility]: 50% mid RCA stenosis and mild proximal disease in the left circumflex. Ejection fraction was 50%.    Past Surgical History  Procedure Laterality Date  . Tonsillectomy  1958  . Mastectomy  2004  . Colonoscopy  8/09    Hyperplastic polyp; repeat in 10 years  . Hip fracture surgery  06/14/2011    ball replaced (Dr. Earvin Hansen)  . Breast surgery  10/2002  . Abdominal hysterectomy  1985    endometriosis; anatomy  . Cardiac catheterization  12/19/12    [facility]; EF 50%    There were no vitals filed for this visit.  Visit Diagnosis:  Lymphedema of left lower extremity      Subjective Assessment - 04/07/15 1109    Subjective My Lt thigh feels better today, just a little tight.    Currently in Pain? No/denies               LYMPHEDEMA/ONCOLOGY QUESTIONNAIRE - 04/07/15 1110    Left Lower Extremity Lymphedema   20 cm Proximal to Suprapatella 60.9 cm   10 cm Proximal  to Suprapatella 53.9 cm   At Midpatella/Popliteal Crease 45.8 cm   30 cm Proximal to Floor at Lateral Plantar Foot 43.6 cm   20 cm Proximal to Floor at Lateral Plantar Foot 33 cm   10 cm Proximal to Floor at Lateral Malleoli 27.7 cm   5 cm Proximal to 1st MTP Joint 22.2 cm   Around Proximal Great Toe 7.5 cm   Other 40 cm. proximal to floor, 45.5 cm.                  [place] Adult PT Treatment/Exercise - 04/07/15 0001    Manual Therapy   Manual Lymphatic Drainage (MLD) In Supine: Short neck, superficial and deep abdominals, Rt inguinal and Lt axillary nodes, anterior inter-inguinal and Lt inguino-axillary anastomosis and Lt UE from dorsal foot to lateral thigh reviewing with patient throughout.                         [facility] Goals - 04/07/15 1158    CC Long Term Goal  #1   Title knowledgeable about self-manual lymph drainage and able to perform it or sister able to perform it  Pt independent  with this, not going to bring sister in for instruction.   Status Achieved   CC Long Term Goal  #2   Title knowledgeable about lymphedema risk reduction  Discussed with pt last visit and issued handout.   Status Achieved   CC Long Term Goal  #3   Title knowledgeable about compression garment options (day- and nighttime), and where and how to obtain these  Pt would like to look into getting a biacare velcro garment that she can sleep in and wear during the day prn. Is awaiting hearing from [employer] for fitting.   Status Partially Met            Plan - 04/07/15 1154    Clinical Impression Statement Patients circumference measurements have maintainted at her lower leg where she was not originally noticing swelling, and her thigh circumference has continued to steadily reduce. She is very compliant with her  self manual lymph drainage and will be starting back to her exercise program possibly by the end of the month. Pt would like to come one more visit  next week for one more circumferential check.    Pt will benefit from skilled therapeutic intervention in order to improve on the following deficits Increased edema;Decreased knowledge of use of DME   Rehab Potential Excellent   PT Frequency 2x / week   PT Treatment/Interventions Manual lymph drainage;Patient/family education;ADLs/Self Care Home Management;DME Instruction   PT Next Visit Plan Discharge next visit; continue manual lymph drainage and review with pt prn, though she seems to be doing very well with this.    Consulted and Agree with Plan of Care Patient        Problem List Patient Active Problem List   Diagnosis Date Noted  . Chronic diastolic heart failure 16/38/4536  . Coronary artery disease   . OTHER MALAISE AND FATIGUE 12/28/2008  . ROTATOR CUFF SYNDROME, LEFT 12/10/2008  . HYPERLIPIDEMIA 09/28/2008  . HYPERTENSION 09/28/2008  . ALLERGIC RHINITIS 09/28/2008  . OSTEOARTHRITIS 09/28/2008  . DCIS (ductal carcinoma in situ), LEFT, remission 09/28/2008    Collie Siad Ann,PTA 04/07/2015, 12:00 PM  [place], [place], [address] Phone: [phone]   Fax:  [phone]

## 2015-04-14 ENCOUNTER — Ambulatory Visit: Payer: 59 | Attending: Surgery

## 2015-04-14 DIAGNOSIS — I890 Lymphedema, not elsewhere classified: Secondary | ICD-10-CM | POA: Diagnosis present

## 2015-04-14 NOTE — Patient Instructions (Addendum)
Abdominal sequence: Light wave like skin stretches toward the belly button  1. Start at LEFT pelvis 2. RIGHT pelvis 3. RIGHT ribs 4. BETWEEN ribs above belly button 5. LEFT ribs 5. LEFT pelvis  Then 5 deep breaths after resisting breath with hands.  Collie Siad, PTA [phone]

## 2015-04-14 NOTE — Therapy (Signed)
[place], [place], [address] Phone: [phone]   Fax:  [phone]  Physical Therapy Treatment  Patient Details  Name: [name] [name] MRN: [id number] Date of Birth: 1951-03-31 Referring Provider:  Rickard Patience, MD  Encounter Date: 04/14/2015      PT End of Session - 04/14/15 1021    Visit Number 6   Number of Visits 5   Date for PT Re-Evaluation 04/21/15   PT Start Time 0937   PT Stop Time 1021   PT Time Calculation (min) 44 min   Activity Tolerance Patient tolerated treatment well   Behavior During Therapy [facility] for tasks assessed/performed      Past Medical History  Diagnosis Date  . HLD (hyperlipidemia)   . HTN (hypertension)   . Allergic rhinitis   . Osteoarthritis   . Osteopenia   . History of hepatitis B     ?--tested + by [employer]  . Avulsion fracture of distal fibula 3/10    and sprain; Left  . Breast cancer     history of  . Coronary artery disease     Cardiac catheterization in April of 2014 at [facility]: 50% mid RCA stenosis and mild proximal disease in the left circumflex. Ejection fraction was 50%.    Past Surgical History  Procedure Laterality Date  . Tonsillectomy  1958  . Mastectomy  2004  . Colonoscopy  8/09    Hyperplastic polyp; repeat in 10 years  . Hip fracture surgery  06/14/2011    ball replaced (Dr. Earvin Hansen)  . Breast surgery  10/2002  . Abdominal hysterectomy  1985    endometriosis; anatomy  . Cardiac catheterization  12/19/12    [facility]; EF 50%    There were no vitals filed for this visit.  Visit Diagnosis:  Lymphedema of left lower extremity      Subjective Assessment - 04/14/15 0949    Subjective My fluid flared up some on Sunday so I just treated it extra doing the self manual lymph drainage 3 times.  Getting measured for my garment today. Ready to discharge!               LYMPHEDEMA/ONCOLOGY QUESTIONNAIRE - 04/14/15 0959    Left Lower  Extremity Lymphedema   20 cm Proximal to Suprapatella 60.9 cm   10 cm Proximal to Suprapatella 53.8 cm   At Midpatella/Popliteal Crease 46.1 cm   30 cm Proximal to Floor at Lateral Plantar Foot 42.3 cm   20 cm Proximal to Floor at Lateral Plantar Foot 31.6 cm   10 cm Proximal to Floor at Lateral Malleoli 27.8 cm   5 cm Proximal to 1st MTP Joint 22.2 cm   Around Proximal Great Toe 7.5 cm   Other 40 cm. proximal to floor, 46.1 cm.                  [place] [facility] Adult PT Treatment/Exercise - 04/14/15 0001    Manual Therapy   Manual Lymphatic Drainage (MLD) In Supine: Short neck, superficial and deep abdominals, Rt inguinal and Lt axillary nodes, anterior inter-inguinal and Lt inguino-axillary anastomosis and Lt UE from dorsal foot to lateral thigh had pt perform and then therapist performed                PT Education - 04/14/15 0958    Education provided Yes   Education Details Reviewed all self manual lymph drianage and added superficial abdominals   Person(s) Educated Patient  Methods Explanation;Demonstration;Handout   Comprehension Verbalized understanding;Returned demonstration                [facility] Goals - 04/14/15 1015    CC Long Term Goal  #3   Title knowledgeable about compression garment options (day- and nighttime), and where and how to obtain these  Pt to be measured today.   Status Achieved            Plan - 04/14/15 1022    Clinical Impression Statement Pt has done very well and made great progress overall. She is independent with her self manual lymph drianage performing this daily and will be measured for her compression garments for nighttime today. All goals met and pt is ready for discharge.    Pt will benefit from skilled therapeutic intervention in order to improve on the following deficits Increased edema;Decreased knowledge of use of DME   Rehab Potential Excellent   PT Frequency 2x / week   PT Treatment/Interventions  Manual lymph drainage;Patient/family education;ADLs/Self Care Home Management;DME Instruction   PT Next Visit Plan [place] this visit.    Consulted and Agree with Plan of Care Patient        Problem List Patient Active Problem List   Diagnosis Date Noted  . Chronic diastolic heart failure 49/70/2637  . Coronary artery disease   . OTHER MALAISE AND FATIGUE 12/28/2008  . ROTATOR CUFF SYNDROME, LEFT 12/10/2008  . HYPERLIPIDEMIA 09/28/2008  . HYPERTENSION 09/28/2008  . ALLERGIC RHINITIS 09/28/2008  . OSTEOARTHRITIS 09/28/2008  . DCIS (ductal carcinoma in situ), LEFT, remission 09/28/2008    [name] [name], PTA 04/14/2015, 10:24 AM  [place], [place], [address] Phone: [phone]   Fax:  [phone]     PHYSICAL THERAPY DISCHARGE SUMMARY  Visits from Start of Care: 6  Current functional level related to goals / functional outcomes: All three initial goals have been met:  Knowledgeable about self-manual lymph drainage, about lymphedema risk reduction, and about compression garment options.   Remaining deficits: Swelling will continue to require management.   Education / Equipment: Education as noted above under goals.  Plan: Patient agrees to discharge.  Patient goals were partially met. Patient is being discharged due to meeting the stated rehab goals.  ?????   Serafina Royals, PT 04/14/2015 12:29 PM

## 2015-06-30 ENCOUNTER — Telehealth: Payer: Self-pay | Admitting: Family Medicine

## 2015-06-30 NOTE — Telephone Encounter (Signed)
Please call and schedule CPE with fasting labs for Dr. Lorelei Pont.  Patient need office visit prior to any additional refills.

## 2015-07-01 ENCOUNTER — Other Ambulatory Visit: Payer: Self-pay | Admitting: Family Medicine

## 2015-07-01 NOTE — Telephone Encounter (Signed)
Left message asking pt to call office  °

## 2015-07-01 NOTE — Telephone Encounter (Signed)
Pt scheduled 07/12/15 cpe labs 07/07/15

## 2015-07-07 ENCOUNTER — Other Ambulatory Visit (INDEPENDENT_AMBULATORY_CARE_PROVIDER_SITE_OTHER): Payer: 59

## 2015-07-07 DIAGNOSIS — I158 Other secondary hypertension: Secondary | ICD-10-CM

## 2015-07-07 DIAGNOSIS — R5383 Other fatigue: Secondary | ICD-10-CM

## 2015-07-07 DIAGNOSIS — E785 Hyperlipidemia, unspecified: Secondary | ICD-10-CM | POA: Diagnosis not present

## 2015-07-07 LAB — COMPREHENSIVE METABOLIC PANEL
ALT: 40 U/L — ABNORMAL HIGH (ref 0–35)
AST: 28 U/L (ref 0–37)
Albumin: 4.1 g/dL (ref 3.5–5.2)
Alkaline Phosphatase: 69 U/L (ref 39–117)
BUN: 15 mg/dL (ref 6–23)
CO2: 33 mEq/L — ABNORMAL HIGH (ref 19–32)
Calcium: 9.9 mg/dL (ref 8.4–10.5)
Chloride: 98 mEq/L (ref 96–112)
Creatinine, Ser: 0.66 mg/dL (ref 0.40–1.20)
GFR: 95.82 mL/min (ref >60.00)
Glucose, Bld: 110 mg/dL — ABNORMAL HIGH (ref 70–99)
Potassium: 3.8 mEq/L (ref 3.5–5.1)
Sodium: 139 mEq/L (ref 135–145)
Total Bilirubin: 0.4 mg/dL (ref 0.2–1.2)
Total Protein: 7.1 g/dL (ref 6.0–8.3)

## 2015-07-07 LAB — CBC WITH DIFFERENTIAL/PLATELET
Basophils Absolute: 0 10*3/uL (ref 0.0–0.1)
Basophils Relative: 0.3 % (ref 0.0–3.0)
Eosinophils Absolute: 0.3 10*3/uL (ref 0.0–0.7)
Eosinophils Relative: 3.1 % (ref 0.0–5.0)
HCT: 43.9 % (ref 36.0–46.0)
Hemoglobin: 14.4 g/dL (ref 12.0–15.0)
Lymphocytes Relative: 25.3 % (ref 12.0–46.0)
Lymphs Abs: 2.3 10*3/uL (ref 0.7–4.0)
MCHC: 32.7 g/dL (ref 30.0–36.0)
MCV: 87.9 fl (ref 78.0–100.0)
Monocytes Absolute: 0.7 10*3/uL (ref 0.1–1.0)
Monocytes Relative: 7.7 % (ref 3.0–12.0)
Neutro Abs: 5.7 10*3/uL (ref 1.4–7.7)
Neutrophils Relative %: 63.6 % (ref 43.0–77.0)
Platelets: 315 10*3/uL (ref 150.0–400.0)
RBC: 4.99 Mil/uL (ref 3.87–5.11)
RDW: 15.2 % (ref 11.5–15.5)
WBC: 8.9 10*3/uL (ref 4.0–10.5)

## 2015-07-07 LAB — LIPID PANEL
Cholesterol: 210 mg/dL — ABNORMAL HIGH (ref 0–200)
HDL: 33.2 mg/dL — ABNORMAL LOW (ref >39.00)
NonHDL: 176.88
Total CHOL/HDL Ratio: 6
Triglycerides: 343 mg/dL — ABNORMAL HIGH (ref 0.0–149.0)
VLDL: 68.6 mg/dL — ABNORMAL HIGH (ref 0.0–40.0)

## 2015-07-07 LAB — TSH: TSH: 2.18 u[IU]/mL (ref 0.35–4.50)

## 2015-07-07 LAB — VITAMIN D 25 HYDROXY (VIT D DEFICIENCY, FRACTURES): VITD: 25.52 ng/mL — ABNORMAL LOW (ref 30.00–100.00)

## 2015-07-07 LAB — LDL CHOLESTEROL, DIRECT: Direct LDL: 52 mg/dL

## 2015-07-12 ENCOUNTER — Encounter: Payer: Self-pay | Admitting: Family Medicine

## 2015-07-12 ENCOUNTER — Ambulatory Visit (INDEPENDENT_AMBULATORY_CARE_PROVIDER_SITE_OTHER): Payer: 59 | Admitting: Family Medicine

## 2015-07-12 VITALS — BP 144/80 | HR 88 | Temp 98.1°F | Ht 65.0 in | Wt 217.2 lb

## 2015-07-12 DIAGNOSIS — Z0000 Encounter for general adult medical examination without abnormal findings: Secondary | ICD-10-CM

## 2015-07-12 DIAGNOSIS — Z23 Encounter for immunization: Secondary | ICD-10-CM

## 2015-07-12 DIAGNOSIS — F4329 Adjustment disorder with other symptoms: Secondary | ICD-10-CM

## 2015-07-12 DIAGNOSIS — F4321 Adjustment disorder with depressed mood: Secondary | ICD-10-CM | POA: Diagnosis not present

## 2015-07-12 DIAGNOSIS — F4381 Prolonged grief disorder: Secondary | ICD-10-CM

## 2015-07-12 MED ORDER — FLUTICASONE PROPIONATE 50 MCG/ACT NA SUSP: 1.0000 | Freq: Every day | NASAL | Status: DC

## 2015-07-12 MED ORDER — CHLORTHALIDONE 25 MG PO TABS: ORAL_TABLET | ORAL | Status: DC

## 2015-07-12 MED ORDER — DICLOFENAC SODIUM 75 MG PO TBEC: 75.0000 mg | DELAYED_RELEASE_TABLET | Freq: Two times a day (BID) | ORAL | Status: DC

## 2015-07-12 MED ORDER — ATORVASTATIN CALCIUM 20 MG PO TABS: ORAL_TABLET | ORAL | Status: DC

## 2015-07-12 MED ORDER — FLUOXETINE HCL 40 MG PO CAPS: 40.0000 mg | ORAL_CAPSULE | Freq: Every day | ORAL | Status: DC

## 2015-07-12 MED ORDER — FLUOXETINE HCL 40 MG PO CAPS
40.0000 mg | ORAL_CAPSULE | Freq: Every day | ORAL | Status: DC
Start: 2015-07-12 — End: 2015-07-12

## 2015-07-12 MED ORDER — LOSARTAN POTASSIUM 50 MG PO TABS: ORAL_TABLET | ORAL | Status: DC

## 2015-07-12 NOTE — Patient Instructions (Addendum)
Increase your FLUOXETINE (Prozac) from 20 mg to 40 mg. - Until the new medicine gets to your house take 2 tablets of the 20 mg tablets once a day.   Vit D, 2000 units a day

## 2015-07-12 NOTE — Progress Notes (Signed)
Pre visit review using our clinic review tool, if applicable. No additional management support is needed unless otherwise documented below in the visit note. 

## 2015-07-12 NOTE — Progress Notes (Signed)
Dr. Frederico Hamman T. Leane Loring, MD, [facility] [place], [address] Phone: [phone] Fax: [phone]  07/12/2015  Patient: [name] [name], MRN: [id number], DOB: 1951-01-01, 64 y.o.  Primary Physician:  Owens Loffler, MD   Chief Complaint  Patient presents with  . Annual Exam   Subjective:   [name] [name] is a 64 y.o. pleasant patient who presents with the following:  Health Maintenance Summary Reviewed and updated, unless pt declines services.  Tobacco History Reviewed. Non-smoker Alcohol: No concerns, no excessive use Exercise Habits: minimal activity, rec at least 30 mins 5 times a week STD concerns: none Drug Use: None Birth control method: surgical Menses regular: n/a Lumps or breast concerns: no Breast Cancer Family History: personal hx  GRIEF: her husband died suddenly approximately 6 months ago with a closed head injury and hemorrhage, and she is still dealing with this in the active grieving process, but this is been for 6 months.  She is tearful somewhat in the office today, she does have good support systems, and her brother lives with her now, which is helping.  She does feel sad and depressed at times.  She also has some anhedonia, and don't really have any feelings of pleasure from doing things that she normally would have.  She is not suicidal or homicidal.  Steve's passing is still hard.  Brother is living with her now.   Vit d Chol  mammo needed  [facility] counselling / hospice.   Refilled everything.   BP Readings from Last 3 Encounters:  07/12/15 144/80  09/02/14 120/70  01/26/14 124/76    No bp meds today  Health Maintenance  Topic Date Due  . HIV Screening  06/28/1966  . MAMMOGRAM  02/21/2016  . INFLUENZA VACCINE  04/11/2016  . COLONOSCOPY  05/09/2016  . TETANUS/TDAP  08/31/2020  . ZOSTAVAX  Addressed  . Hepatitis C Screening  Completed    Immunization History    Administered Date(s) Administered  . Influenza Split 07/10/2011  . Influenza Whole 08/11/2008, 10/11/2009, 08/31/2010  . Influenza,inj,Quad PF,36+ Mos 05/21/2013, 06/23/2014, 07/12/2015  . Td 08/31/2010   Patient Active Problem List   Diagnosis Date Noted  . Chronic diastolic heart failure ([place]) 06/18/2013  . Coronary artery disease   . OTHER MALAISE AND FATIGUE 12/28/2008  . ROTATOR CUFF SYNDROME, LEFT 12/10/2008  . HYPERLIPIDEMIA 09/28/2008  . HYPERTENSION 09/28/2008  . ALLERGIC RHINITIS 09/28/2008  . OSTEOARTHRITIS 09/28/2008  . DCIS (ductal carcinoma in situ), LEFT, remission 09/28/2008   Past Medical History  Diagnosis Date  . HLD (hyperlipidemia)   . HTN (hypertension)   . Allergic rhinitis   . Osteoarthritis   . Osteopenia   . History of hepatitis B     ?--tested + by [employer]  . Avulsion fracture of distal fibula 3/10    and sprain; Left  . Breast cancer ([place])     history of  . Coronary artery disease     Cardiac catheterization in April of 2014 at [facility]: 50% mid RCA stenosis and mild proximal disease in the left circumflex. Ejection fraction was 50%.   Past Surgical History  Procedure Laterality Date  . Tonsillectomy  1958  . Mastectomy  2004  . Colonoscopy  8/09    Hyperplastic polyp; repeat in 10 years  . Hip fracture surgery  06/14/2011    ball replaced (Dr. Earvin Hansen)  . Breast surgery  10/2002  . Abdominal hysterectomy  1985  endometriosis; anatomy  . Cardiac catheterization  12/19/12    [facility]; EF 50%   Social History   Social History  . Marital Status: Married    Spouse Name: (Widow) to [place]  . Number of Children: N/A  . Years of Education: N/A   Occupational History  . Not on file.   Social History Main Topics  . Smoking status: Former Smoker -- 0.50 packs/day for 10 years    Types: Cigarettes  . Smokeless tobacco: Former Systems developer    Quit date: 01/24/1986  . Alcohol Use: No  . Drug Use: No  . Sexual Activity: Not on file   Other Topics  Concern  . Not on file   Social History Narrative   Widowed to husband Renato Gails      No regular exercise   Family History  Problem Relation Age of Onset  . Ovarian cancer      Aunt  . Diabetes      GP  . Breast cancer Sister 25  . Cancer Sister     breast  . Heart attack Father 22  . Heart disease Father   . Sarcoidosis Mother   . Cancer Paternal Aunt     colon/ovarian   Allergies  Allergen Reactions  . Ace Inhibitors     REACTION: Cough  . Codeine     Hallucinations  . Morphine   . Sulfonamide Derivatives     Medication list has been reviewed and updated.   General: Denies fever, chills, sweats. No significant weight loss. Eyes: Denies blurring,significant itching ENT: Denies earache, sore throat, and hoarseness.  Cardiovascular: Denies chest pains, palpitations, dyspnea on exertion,  Respiratory: Denies cough, dyspnea at rest,wheeezing Breast: no concerns about lumps GI: Denies nausea, vomiting, diarrhea, constipation, change in bowel habits, abdominal pain, melena, hematochezia GU: Denies dysuria, hematuria, urinary hesitancy, nocturia, denies STD risk, no concerns about discharge Musculoskeletal: Denies back pain, joint pain Derm: Denies rash, itching Neuro: Denies  paresthesias, frequent falls, frequent headaches Psych: Denies depression, anxiety Endocrine: Denies cold intolerance, heat intolerance, polydipsia Heme: Denies enlarged lymph nodes Allergy: No hayfever  Objective:   BP 144/80 mmHg  Pulse 88  Temp(Src) 98.1 F (36.7 C) (Oral)  Ht _0  (1.651 m)  Wt 217 lb 4 oz (98.544 kg)  BMI 36.15 kg/m2 No exam data present  GEN: well developed, well nourished, no acute distress Eyes: conjunctiva and lids normal, PERRLA, EOMI ENT: TM clear, nares clear, oral exam WNL Neck: supple, no lymphadenopathy, no thyromegaly, no JVD Pulm: clear to auscultation and percussion, respiratory effort normal CV: regular rate and rhythm, S1-S2, no  murmur, rub or gallop, no bruits Chest: no scars, masses, no lumps BREAST: breast exam declined GI: soft, non-tender; no hepatosplenomegaly, masses; active bowel sounds all quadrants GU: GU exam declined Lymph: no cervical, axillary or inguinal adenopathy MSK: gait normal, muscle tone and strength WNL, no joint swelling, effusions, discoloration, crepitus  SKIN: clear, good turgor, color WNL, no rashes, lesions, or ulcerations Neuro: normal mental status, normal strength, sensation, and motion Psych: alert; oriented to person, place and time, normally interactive and not anxious or depressed in appearance.   All labs reviewed with patient. Lipids:    Component Value Date/Time   CHOL 210* 07/07/2015 1101   TRIG 343.0* 07/07/2015 1101   HDL 33.20* 07/07/2015 1101   LDLDIRECT 52.0 07/07/2015 1101   VLDL 68.6* 07/07/2015 1101   CHOLHDL 6 07/07/2015 1101   CBC: CBC Latest Ref Rng 07/07/2015  01/19/2014 12/16/2012  WBC 4.0 - 10.5 K/uL 8.9 8.7 9.0  Hemoglobin 12.0 - 15.0 g/dL 14.4 14.2 14.8  Hematocrit 36.0 - 46.0 % 43.9 43.0 44.1  Platelets 150.0 - 400.0 K/uL 315.0 319.0 169    Basic Metabolic Panel:    Component Value Date/Time   NA 139 07/07/2015 1101   NA 142 12/16/2012 1100   K 3.8 07/07/2015 1101   CL 98 07/07/2015 1101   CO2 33* 07/07/2015 1101   BUN 15 07/07/2015 1101   BUN 13 12/16/2012 1100   CREATININE 0.66 07/07/2015 1101   GLUCOSE 110* 07/07/2015 1101   GLUCOSE 118* 12/16/2012 1100   CALCIUM 9.9 07/07/2015 1101   Hepatic Function Latest Ref Rng 07/07/2015 01/19/2014 10/31/2012  Total Protein 6.0 - 8.3 g/dL 7.1 7.5 7.7  Albumin 3.5 - 5.2 g/dL 4.1 4.2 4.3  AST 0 - 37 U/L 28 27 41(H)  ALT 0 - 35 U/L 40(H) 33 57(H)  Alk Phosphatase 39 - 117 U/L 69 55 74  Total Bilirubin 0.2 - 1.2 mg/dL 0.4 0.4 0.5  Bilirubin, Direct 0.0 - 0.3 mg/dL - 0.0 0.0    Lab Results  Component Value Date   TSH 2.18 07/07/2015   No results found.  Assessment and Plan:   Routine  general medical examination at a health care facility  Need for prophylactic vaccination and inoculation against influenza - Plan: Flu Vaccine QUAD 36+ mos IM  Prolonged grief reaction  >10 minutes spent in face to face time with patient, >50% spent in counselling or coordination of care: and worsening depression.  Baseline depression with worsening.  Some of this I think is situational after husband's loss, but she is more tearful, and she also has significant anhedonia compared to baseline.  We are going to increase her Prozac dose to 40 mg from 20 mg, and I recommended some grief counseling through hospice.  She was given this information and contact information.  Health Maintenance Exam: The patient's preventative maintenance and recommended screening tests for an annual wellness exam were reviewed in full today. Brought up to date unless services declined.  Counselled on the importance of diet, exercise, and its role in overall health and mortality. The patient's FH and SH was reviewed, including their home life, tobacco status, and drug and alcohol status.  Follow-up: No Follow-up on file. Or follow-up in 1 year for complete physical examination  New Prescriptions   FLUOXETINE (PROZAC) 40 MG CAPSULE    Take 1 capsule (40 mg total) by mouth daily.   Modified Medications   Modified Medication Previous Medication   ATORVASTATIN (LIPITOR) 20 MG TABLET atorvastatin (LIPITOR) 20 MG tablet      Take 1 tablet by mouth at  bedtime    Take 1 tablet by mouth at  bedtime   CHLORTHALIDONE (HYGROTON) 25 MG TABLET chlorthalidone (HYGROTON) 25 MG tablet      Take 1 tablet by mouth  daily    Take 1 tablet by mouth  daily   DICLOFENAC (VOLTAREN) 75 MG EC TABLET diclofenac (VOLTAREN) 75 MG EC tablet      Take 1 tablet (75 mg total) by mouth 2 (two) times daily.    Take 1 tablet (75 mg total) by mouth 2 (two) times daily.   FLUTICASONE (FLONASE) 50 MCG/ACT NASAL SPRAY fluticasone (FLONASE) 50 MCG/ACT  nasal spray      Place 1 spray into both nostrils daily.    Place 1 spray into both nostrils daily.   LOSARTAN (COZAAR)  50 MG TABLET losartan (COZAAR) 50 MG tablet      Take 1 tablet by mouth  daily    Take 1 tablet by mouth  daily   Orders Placed This Encounter  Procedures  . Flu Vaccine QUAD 36+ mos IM    Signed,  Newell Frater T. Aryiana Klinkner, MD   Patient's Medications  New Prescriptions   FLUOXETINE (PROZAC) 40 MG CAPSULE    Take 1 capsule (40 mg total) by mouth daily.  Previous Medications   ASPIRIN 81 MG TABLET    Take 81 mg by mouth daily.     CALCIUM CITRATE-VITAMIN D (CITRACAL + D PO)    Take by mouth.   CARVEDILOL (COREG) 12.5 MG TABLET    Take 1 tablet by mouth two  times daily   GRAPE SEED EXTRACT PO    Take by mouth. Takes 2 tablets daily.   MISC NATURAL PRODUCTS (OSTEO BI-FLEX ADV DOUBLE ST) TABS    Take 2 tablets by mouth daily.     MULTIPLE VITAMIN (MULTIVITAMIN) TABLET    Take 1 tablet by mouth daily.     MULTIPLE VITAMINS-MINERALS (VISION FORMULA) TABS    Take 1 tablet by mouth daily.    OVER THE COUNTER MEDICATION    Mega Red, take one by mouth daily  Modified Medications   Modified Medication Previous Medication   ATORVASTATIN (LIPITOR) 20 MG TABLET atorvastatin (LIPITOR) 20 MG tablet      Take 1 tablet by mouth at  bedtime    Take 1 tablet by mouth at  bedtime   CHLORTHALIDONE (HYGROTON) 25 MG TABLET chlorthalidone (HYGROTON) 25 MG tablet      Take 1 tablet by mouth  daily    Take 1 tablet by mouth  daily   DICLOFENAC (VOLTAREN) 75 MG EC TABLET diclofenac (VOLTAREN) 75 MG EC tablet      Take 1 tablet (75 mg total) by mouth 2 (two) times daily.    Take 1 tablet (75 mg total) by mouth 2 (two) times daily.   FLUTICASONE (FLONASE) 50 MCG/ACT NASAL SPRAY fluticasone (FLONASE) 50 MCG/ACT nasal spray      Place 1 spray into both nostrils daily.    Place 1 spray into both nostrils daily.   LOSARTAN (COZAAR) 50 MG TABLET losartan (COZAAR) 50 MG tablet      Take 1 tablet by  mouth  daily    Take 1 tablet by mouth  daily  Discontinued Medications   FLUOXETINE (PROZAC) 20 MG TABLET    Take 1 tablet by mouth  daily

## 2015-07-15 ENCOUNTER — Encounter: Payer: Self-pay | Admitting: Family Medicine

## 2015-09-22 ENCOUNTER — Ambulatory Visit (INDEPENDENT_AMBULATORY_CARE_PROVIDER_SITE_OTHER): Payer: 59 | Admitting: Ophthalmology

## 2015-10-07 ENCOUNTER — Encounter: Payer: Self-pay | Admitting: Primary Care

## 2015-10-07 ENCOUNTER — Ambulatory Visit (INDEPENDENT_AMBULATORY_CARE_PROVIDER_SITE_OTHER): Payer: 59 | Admitting: Primary Care

## 2015-10-07 VITALS — BP 126/82 | HR 79 | Temp 97.8°F | Ht 65.0 in | Wt 218.8 lb

## 2015-10-07 DIAGNOSIS — R05 Cough: Secondary | ICD-10-CM | POA: Diagnosis not present

## 2015-10-07 DIAGNOSIS — H109 Unspecified conjunctivitis: Secondary | ICD-10-CM

## 2015-10-07 DIAGNOSIS — R059 Cough, unspecified: Secondary | ICD-10-CM

## 2015-10-07 MED ORDER — ERYTHROMYCIN 5 MG/GM OP OINT: TOPICAL_OINTMENT | OPHTHALMIC | Status: DC

## 2015-10-07 MED ORDER — AZITHROMYCIN 250 MG PO TABS: ORAL_TABLET | ORAL | Status: DC

## 2015-10-07 NOTE — Progress Notes (Signed)
Pre visit review using our clinic review tool, if applicable. No additional management support is needed unless otherwise documented below in the visit note. 

## 2015-10-07 NOTE — Patient Instructions (Signed)
Apply a thin layer of ointment to left eye twice daily for 5-7 days.  Start Azithromycin antibiotics. Take 2 tablets by mouth today, then 1 tablet daily for 4 additional days.  Cough: Robitussin DM. This may be purchased over the counter.  Increase consumption of fluids and rest.  It was a pleasure meeting you!

## 2015-10-07 NOTE — Progress Notes (Signed)
Subjective:    Patient ID: [name] [name], female    DOB: 1951-04-12, 65 y.o.   MRN: [id number]  HPI  [name] [name] is a 65 year old female who presents today with multiple complaints.  1) Ear pain: Her pain is located to the right ear that has been present for the past week. She also reports dry cough that has been present for 1 week as well, that has become worse this morning. Denies fevers, chills, nausea. She's taken tylenol sinus and aleve for her pain with temporary improvement. Cough is dry.  2) Rash: Located to left medial side proximal to canthus. Her rash has been present for about 6 days since scratching due to itching. She's also noticed her eye matted shut in the mornings. Her eye is itchy and tender when scratching. She's had watery eyes as well. She's been applying warm compresses without resolve. Her rash has become worse and is now draining with puss.  Review of Systems  Constitutional: Negative for fever, chills and fatigue.  HENT: Positive for congestion, ear pain and sore throat. Negative for rhinorrhea.   Eyes: Positive for pain, discharge, redness and itching.  Respiratory: Positive for cough. Negative for shortness of breath.   Cardiovascular: Negative for chest pain.  Musculoskeletal: Negative for myalgias.       Past Medical History  Diagnosis Date  . HLD (hyperlipidemia)   . HTN (hypertension)   . Allergic rhinitis   . Osteoarthritis   . Osteopenia   . History of hepatitis B     ?--tested + by [employer]  . Avulsion fracture of distal fibula 3/10    and sprain; Left  . Breast cancer ([place])     history of  . Coronary artery disease     Cardiac catheterization in April of 2014 at [facility]: 50% mid RCA stenosis and mild proximal disease in the left circumflex. Ejection fraction was 50%.    Social History   Social History  . Marital Status: Married    Spouse Name: (Widow) to [place]  . Number of Children: N/A  . Years of Education: N/A   Occupational  History  . Not on file.   Social History Main Topics  . Smoking status: Former Smoker -- 0.50 packs/day for 10 years    Types: Cigarettes  . Smokeless tobacco: Former Systems developer    Quit date: 01/24/1986  . Alcohol Use: No  . Drug Use: No  . Sexual Activity: Not on file   Other Topics Concern  . Not on file   Social History Narrative   Widowed to husband Renato Gails      No regular exercise    Past Surgical History  Procedure Laterality Date  . Tonsillectomy  1958  . Mastectomy  2004  . Colonoscopy  8/09    Hyperplastic polyp; repeat in 10 years  . Hip fracture surgery  06/14/2011    ball replaced (Dr. Earvin Hansen)  . Breast surgery  10/2002  . Abdominal hysterectomy  1985    endometriosis; anatomy  . Cardiac catheterization  12/19/12    [facility]; EF 50%    Family History  Problem Relation Age of Onset  . Ovarian cancer      Aunt  . Diabetes      GP  . Breast cancer Sister 39  . Cancer Sister     breast  . Heart attack Father 61  . Heart disease Father   . Sarcoidosis Mother   .  Cancer Paternal Aunt     colon/ovarian    Allergies  Allergen Reactions  . Ace Inhibitors     REACTION: Cough  . Codeine     Hallucinations  . Morphine   . Sulfonamide Derivatives     Current Outpatient Prescriptions on File Prior to Visit  Medication Sig Dispense Refill  . aspirin 81 MG tablet Take 81 mg by mouth daily.      [name] Kitchen atorvastatin (LIPITOR) 20 MG tablet Take 1 tablet by mouth at  bedtime 90 tablet 3  . Calcium Citrate-Vitamin D (CITRACAL + D PO) Take by mouth.    . carvedilol (COREG) 12.5 MG tablet Take 1 tablet by mouth two  times daily 180 tablet 1  . chlorthalidone (HYGROTON) 25 MG tablet Take 1 tablet by mouth  daily 90 tablet 3  . diclofenac (VOLTAREN) 75 MG EC tablet Take 1 tablet (75 mg total) by mouth 2 (two) times daily. 180 tablet 3  . FLUoxetine (PROZAC) 40 MG capsule Take 1 capsule (40 mg total) by mouth daily. 90 capsule 3  . fluticasone (FLONASE) 50 MCG/ACT  nasal spray Place 1 spray into both nostrils daily. 48 g 3  . GRAPE SEED EXTRACT PO Take by mouth. Takes 2 tablets daily.    [name] Kitchen losartan (COZAAR) 50 MG tablet Take 1 tablet by mouth  daily 90 tablet 3  . Misc Natural Products (OSTEO BI-FLEX ADV DOUBLE ST) TABS Take 2 tablets by mouth daily.      . Multiple Vitamin (MULTIVITAMIN) tablet Take 1 tablet by mouth daily.      . Multiple Vitamins-Minerals (VISION FORMULA) TABS Take 1 tablet by mouth daily.     [name] Kitchen OVER THE COUNTER MEDICATION Mega Red, take one by mouth daily     No current facility-administered medications on file prior to visit.    BP 126/82 mmHg  Pulse 79  Temp(Src) 97.8 F (36.6 C) (Oral)  Ht 5\' 5"  (1.651 m)  Wt 218 lb 12.8 oz (99.247 kg)  BMI 36.41 kg/m2  SpO2 97%    Objective:   Physical Exam  Constitutional: She appears well-nourished.  HENT:  Right Ear: Ear canal normal. Tympanic membrane is erythematous. Tympanic membrane is not bulging.  Left Ear: Tympanic membrane and ear canal normal.  Nose: Nose normal.  Mouth/Throat: Oropharynx is clear and moist.  Eyes: Pupils are equal, round, and reactive to light. Left eye exhibits discharge. Left conjunctiva is injected. Left conjunctiva has no hemorrhage.  Small infectious appearing lesion to left inner canthus. Tearing to conjunctiva.   Neck: Neck supple.  Cardiovascular: Normal rate and regular rhythm.   Pulmonary/Chest: Effort normal and breath sounds normal.  Skin: Skin is warm and dry.          Assessment & Plan:  URI:  Located to right ear for 7 days. Worse recently. No sick contacts, however, cough for 7 days. Cough now worse, dry. Exam with clear, but diminished lung sounds.  Mild erythema to right TM, no bulging. Due to duration and multiple complaints, will treat with Zpak course. Fluids, rest, Robitussin DM for cough. Return precautions provided.  Eye irritation:  Infectious appear lesion to left eye, likely from scratching. Some drainage.  Left eye with tearing, mild injection. Suspect bacterial involvement at this point and will treat with erythromycin ointment. Instructions provided for use. Follow up PRN.

## 2015-10-20 ENCOUNTER — Ambulatory Visit (INDEPENDENT_AMBULATORY_CARE_PROVIDER_SITE_OTHER): Payer: 59 | Admitting: Ophthalmology

## 2016-06-21 ENCOUNTER — Encounter: Payer: Self-pay | Admitting: Family Medicine

## 2016-06-21 ENCOUNTER — Ambulatory Visit (INDEPENDENT_AMBULATORY_CARE_PROVIDER_SITE_OTHER): Payer: Medicare Other | Admitting: Family Medicine

## 2016-06-21 VITALS — BP 190/110 | HR 100 | Temp 98.4°F | Ht 65.0 in | Wt 215.8 lb

## 2016-06-21 DIAGNOSIS — J208 Acute bronchitis due to other specified organisms: Secondary | ICD-10-CM

## 2016-06-21 DIAGNOSIS — I5032 Chronic diastolic (congestive) heart failure: Secondary | ICD-10-CM | POA: Diagnosis not present

## 2016-06-21 DIAGNOSIS — I2510 Atherosclerotic heart disease of native coronary artery without angina pectoris: Secondary | ICD-10-CM | POA: Diagnosis not present

## 2016-06-21 DIAGNOSIS — E785 Hyperlipidemia, unspecified: Secondary | ICD-10-CM

## 2016-06-21 DIAGNOSIS — I10 Essential (primary) hypertension: Secondary | ICD-10-CM | POA: Diagnosis not present

## 2016-06-21 MED ORDER — CHLORTHALIDONE 25 MG PO TABS: ORAL_TABLET | ORAL | 1 refills | Status: DC

## 2016-06-21 MED ORDER — AZITHROMYCIN 250 MG PO TABS: ORAL_TABLET | ORAL | 0 refills | Status: DC

## 2016-06-21 MED ORDER — LOSARTAN POTASSIUM 50 MG PO TABS: ORAL_TABLET | ORAL | 1 refills | Status: DC

## 2016-06-21 NOTE — Progress Notes (Signed)
Pre visit review using our clinic review tool, if applicable. No additional management support is needed unless otherwise documented below in the visit note. 

## 2016-06-21 NOTE — Progress Notes (Signed)
Dr. Frederico Hamman T. Verma Grothaus, MD, [facility] [place], [address] Phone: [phone] Fax: [phone]  06/21/2016  Patient: [name] [name], MRN: [id number], DOB: 17-Jul-1951, 65 y.o.  Primary Physician:  Owens Loffler, MD   Chief Complaint  Patient presents with  . Cough    x 1 week  . Blurred Vision  . Sore Throat  . Nasal Congestion   Subjective:   [name] [name] is a 65 y.o. very pleasant female patient who presents with the following:   patient presents not feeling well with the cough productive of sputum for about a week, she also has some sore throat as well as nasal congestion and generally doesn't feel all that well and is tired.  She also has had a drippy eye on the right and is blurred a little bit on that side.  She still is able to read and doesn't have any problem breathing from afar also.  Unfortunately the patient ran out of health insurance for a while, she hasn't taken any of her medication for last few months.  Her blood pressure is quite high right now.  She is just been able to go onto Medicare, and  Will be getting assistance with her medications as well.  Past Medical History, Surgical History, Social History, Family History, Problem List, Medications, and Allergies have been reviewed and updated if relevant.  Patient Active Problem List   Diagnosis Date Noted  . Chronic diastolic heart failure ([place]) 06/18/2013  . Coronary artery disease   . OTHER MALAISE AND FATIGUE 12/28/2008  . ROTATOR CUFF SYNDROME, LEFT 12/10/2008  . HYPERLIPIDEMIA 09/28/2008  . HYPERTENSION 09/28/2008  . ALLERGIC RHINITIS 09/28/2008  . OSTEOARTHRITIS 09/28/2008  . DCIS (ductal carcinoma in situ), LEFT, remission 09/28/2008    Past Medical History:  Diagnosis Date  . Allergic rhinitis   . Avulsion fracture of distal fibula 3/10   and sprain; Left  . Breast cancer ([place])    history of  . Coronary artery disease      Cardiac catheterization in April of 2014 at [facility]: 50% mid RCA stenosis and mild proximal disease in the left circumflex. Ejection fraction was 50%.  . History of hepatitis B    ?--tested + by [employer]  . HLD (hyperlipidemia)   . HTN (hypertension)   . Osteoarthritis   . Osteopenia     Past Surgical History:  Procedure Laterality Date  . ABDOMINAL HYSTERECTOMY  1985   endometriosis; anatomy  . BREAST SURGERY  10/2002  . CARDIAC CATHETERIZATION  12/19/12   [facility]; EF 50%  . COLONOSCOPY  8/09   Hyperplastic polyp; repeat in 10 years  . HIP FRACTURE SURGERY  06/14/2011   ball replaced (Dr. Earvin Hansen)  . MASTECTOMY  2004  . TONSILLECTOMY  1958    Social History   Social History  . Marital status: Married    Spouse name: (Widow) to [place]  . Number of children: N/A  . Years of education: N/A   Occupational History  . Not on file.   Social History Main Topics  . Smoking status: Former Smoker    Packs/day: 0.50    Years: 10.00    Types: Cigarettes  . Smokeless tobacco: Former Systems developer    Quit date: 01/24/1986  . Alcohol use No  . Drug use: No  . Sexual activity: Not on file   Other Topics Concern  . Not on file   Social History Narrative  Widowed to husband Renato Gails      No regular exercise    Family History  Problem Relation Age of Onset  . Ovarian cancer      Aunt  . Diabetes      GP  . Breast cancer Sister 57  . Cancer Sister     breast  . Heart attack Father 99  . Heart disease Father   . Sarcoidosis Mother   . Cancer Paternal Aunt     colon/ovarian    Allergies  Allergen Reactions  . Ace Inhibitors     REACTION: Cough  . Codeine     Hallucinations  . Morphine   . Sulfonamide Derivatives     Medication list reviewed and updated in full in [place].  ROS: GEN: Acute illness details above GI: Tolerating PO intake GU: maintaining adequate hydration and urination Pulm: No SOB Interactive and getting along well at  home.  Otherwise, ROS is as per the HPI.  Objective:   BP (!) 190/110   Pulse 100   Temp 98.4 F (36.9 C) (Oral)   Ht 5\' 5"  (1.651 m)   Wt 215 lb 12 oz (97.9 kg)   SpO2 96%   BMI 35.90 kg/m    GEN: A and O x 3. WDWN. NAD.    ENT: Nose clear, ext NML.  No LAD.  No JVD.  TM's clear. Oropharynx clear.  PULM: Normal WOB, no distress. No crackles, wheezes, rhonchi. CV: RRR, no M/G/R, No rubs, No JVD.   EXT: warm and well-perfused, No c/c/e. PSYCH: Pleasant and conversant.    Laboratory and Imaging Data:  Assessment and Plan:   Acute bronchitis due to other specified organisms  Chronic diastolic heart failure ([place])  Essential hypertension  Hyperlipidemia LDL goal <70  Coronary artery disease involving native coronary artery of native heart without angina pectoris  Probable viral bronchitis.  Continue with supportive care.  If not improving in the next 3 days, recommend starting Z-Pak or if develops fever.  Restart 2 over blood pressure medications.  She should be getting her insurance card for medications within the next few days, and I asked her to call us so that we can have her restart  Everything that she had been on prior to stopping them.  Follow-up: she has an upcoming CPX  New Prescriptions   AZITHROMYCIN (ZITHROMAX) 250 MG TABLET    2 tabs po on day 1, then 1 tab po for 4 days   Modified Medications   Modified Medication Previous Medication   CHLORTHALIDONE (HYGROTON) 25 MG TABLET chlorthalidone (HYGROTON) 25 MG tablet      Take 1 tablet by mouth  daily    Take 1 tablet by mouth  daily   LOSARTAN (COZAAR) 50 MG TABLET losartan (COZAAR) 50 MG tablet      Take 1 tablet by mouth  daily    Take 1 tablet by mouth  daily   No orders of the defined types were placed in this encounter.   Signed,  Maud Deed. Doaa Kendzierski, MD   Patient's Medications  New Prescriptions   AZITHROMYCIN (ZITHROMAX) 250 MG TABLET    2 tabs po on day 1, then 1 tab po for 4 days   Previous Medications   ASPIRIN 81 MG TABLET    Take 81 mg by mouth daily.     ATORVASTATIN (LIPITOR) 20 MG TABLET    Take 1 tablet by mouth at  bedtime   CALCIUM  CITRATE-VITAMIN D (CITRACAL + D PO)    Take by mouth.   CARVEDILOL (COREG) 12.5 MG TABLET    Take 1 tablet by mouth two  times daily   DICLOFENAC (VOLTAREN) 75 MG EC TABLET    Take 1 tablet (75 mg total) by mouth 2 (two) times daily.   FLUOXETINE (PROZAC) 40 MG CAPSULE    Take 1 capsule (40 mg total) by mouth daily.   FLUTICASONE (FLONASE) 50 MCG/ACT NASAL SPRAY    Place 1 spray into both nostrils daily.   GRAPE SEED EXTRACT PO    Take by mouth. Takes 2 tablets daily.   MISC NATURAL PRODUCTS (OSTEO BI-FLEX ADV DOUBLE ST) TABS    Take 2 tablets by mouth daily.     MULTIPLE VITAMIN (MULTIVITAMIN) TABLET    Take 1 tablet by mouth daily.     MULTIPLE VITAMINS-MINERALS (VISION FORMULA) TABS    Take 1 tablet by mouth daily.    OVER THE COUNTER MEDICATION    Mega Red, take one by mouth daily  Modified Medications   Modified Medication Previous Medication   CHLORTHALIDONE (HYGROTON) 25 MG TABLET chlorthalidone (HYGROTON) 25 MG tablet      Take 1 tablet by mouth  daily    Take 1 tablet by mouth  daily   LOSARTAN (COZAAR) 50 MG TABLET losartan (COZAAR) 50 MG tablet      Take 1 tablet by mouth  daily    Take 1 tablet by mouth  daily  Discontinued Medications   AZITHROMYCIN (ZITHROMAX) 250 MG TABLET    Take 2 tablets by mouth today, then 1 tablet daily for 4 additional days.   ERYTHROMYCIN OPHTHALMIC OINTMENT    Apply thin layer to left eye twice daily for 5-7 days.

## 2016-08-14 DIAGNOSIS — Z23 Encounter for immunization: Secondary | ICD-10-CM | POA: Diagnosis not present

## 2016-10-05 ENCOUNTER — Encounter (HOSPITAL_COMMUNITY): Payer: Self-pay | Admitting: Emergency Medicine

## 2016-10-05 ENCOUNTER — Emergency Department (HOSPITAL_COMMUNITY): Payer: Medicare Other

## 2016-10-05 ENCOUNTER — Emergency Department (HOSPITAL_COMMUNITY)
Admission: EM | Admit: 2016-10-05 | Discharge: 2016-10-05 | Disposition: A | Discharge status: Home / Self Care | Payer: Medicare Other | Attending: Emergency Medicine | Admitting: Emergency Medicine

## 2016-10-05 DIAGNOSIS — I5032 Chronic diastolic (congestive) heart failure: Secondary | ICD-10-CM | POA: Diagnosis not present

## 2016-10-05 DIAGNOSIS — I110 Hypertensive heart disease with heart failure: Secondary | ICD-10-CM | POA: Insufficient documentation

## 2016-10-05 DIAGNOSIS — Z853 Personal history of malignant neoplasm of breast: Secondary | ICD-10-CM | POA: Insufficient documentation

## 2016-10-05 DIAGNOSIS — I2510 Atherosclerotic heart disease of native coronary artery without angina pectoris: Secondary | ICD-10-CM | POA: Insufficient documentation

## 2016-10-05 DIAGNOSIS — J189 Pneumonia, unspecified organism: Secondary | ICD-10-CM | POA: Insufficient documentation

## 2016-10-05 DIAGNOSIS — R079 Chest pain, unspecified: Secondary | ICD-10-CM

## 2016-10-05 DIAGNOSIS — Z79899 Other long term (current) drug therapy: Secondary | ICD-10-CM | POA: Diagnosis not present

## 2016-10-05 DIAGNOSIS — Z7982 Long term (current) use of aspirin: Secondary | ICD-10-CM | POA: Insufficient documentation

## 2016-10-05 DIAGNOSIS — Z87891 Personal history of nicotine dependence: Secondary | ICD-10-CM | POA: Diagnosis not present

## 2016-10-05 DIAGNOSIS — R0789 Other chest pain: Secondary | ICD-10-CM | POA: Diagnosis not present

## 2016-10-05 LAB — CBC
HCT: 48 % — ABNORMAL HIGH (ref 36.0–46.0)
Hemoglobin: 15.9 g/dL — ABNORMAL HIGH (ref 12.0–15.0)
MCH: 29.6 pg (ref 26.0–34.0)
MCHC: 33.1 g/dL (ref 30.0–36.0)
MCV: 89.2 fL (ref 78.0–100.0)
Platelets: 295 10*3/uL (ref 150–400)
RBC: 5.38 MIL/uL — ABNORMAL HIGH (ref 3.87–5.11)
RDW: 14.1 % (ref 11.5–15.5)
WBC: 7.3 10*3/uL (ref 4.0–10.5)

## 2016-10-05 LAB — BASIC METABOLIC PANEL
Anion gap: 12 (ref 5–15)
BUN: 12 mg/dL (ref 6–20)
CO2: 26 mmol/L (ref 22–32)
Calcium: 9.6 mg/dL (ref 8.9–10.3)
Chloride: 102 mmol/L (ref 101–111)
Creatinine, Ser: 0.61 mg/dL (ref 0.44–1.00)
GFR calc Af Amer: >60 mL/min (ref >60)
GFR calc non Af Amer: >60 mL/min (ref >60)
Glucose, Bld: 120 mg/dL — ABNORMAL HIGH (ref 65–99)
Potassium: 4.2 mmol/L (ref 3.5–5.1)
Sodium: 140 mmol/L (ref 135–145)

## 2016-10-05 LAB — I-STAT TROPONIN, ED: Troponin i, poc: 0 ng/mL (ref 0.00–0.08)

## 2016-10-05 MED ORDER — AMOXICILLIN 500 MG PO CAPS: 500.0000 mg | ORAL_CAPSULE | Freq: Three times a day (TID) | ORAL | 0 refills | Status: DC

## 2016-10-05 MED ORDER — HYDROMORPHONE HCL 2 MG/ML IJ SOLN
0.5000 mg | Freq: Once | INTRAMUSCULAR | Status: AC
Start: 2016-10-05 — End: 2016-10-05
  Administered 2016-10-05: 0.5 mg via INTRAVENOUS
  Filled 2016-10-05: qty 1

## 2016-10-05 MED ORDER — IOPAMIDOL (ISOVUE-370) INJECTION 76%
INTRAVENOUS | Status: AC
  Administered 2016-10-05: 100 mL
  Filled 2016-10-05: qty 100

## 2016-10-05 MED ORDER — MAGNESIUM OXIDE 400 (241.3 MG) MG PO TABS
400.0000 mg | ORAL_TABLET | Freq: Once | ORAL | Status: AC
  Administered 2016-10-05: 400 mg via ORAL
  Filled 2016-10-05: qty 1

## 2016-10-05 MED ORDER — DEXTROSE 5 % IV SOLN
1.0000 g | Freq: Once | INTRAVENOUS | Status: AC
  Administered 2016-10-05: 1 g via INTRAVENOUS
  Filled 2016-10-05: qty 10

## 2016-10-05 MED ORDER — HYDROMORPHONE HCL 2 MG/ML IJ SOLN
0.7500 mg | Freq: Once | INTRAMUSCULAR | Status: AC
  Administered 2016-10-05: 0.8 mg via INTRAVENOUS
  Filled 2016-10-05: qty 1

## 2016-10-05 MED ORDER — POTASSIUM CHLORIDE CRYS ER 10 MEQ PO TBCR
10.0000 meq | EXTENDED_RELEASE_TABLET | Freq: Once | ORAL | Status: AC
  Administered 2016-10-05: 10 meq via ORAL
  Filled 2016-10-05: qty 1

## 2016-10-05 MED ORDER — SODIUM CHLORIDE 0.9 % IV BOLUS (SEPSIS)
1000.0000 mL | Freq: Once | INTRAVENOUS | Status: AC
  Administered 2016-10-05: 1000 mL via INTRAVENOUS

## 2016-10-05 MED ORDER — AZITHROMYCIN 250 MG PO TABS: 250.0000 mg | ORAL_TABLET | Freq: Every day | ORAL | 0 refills | Status: DC

## 2016-10-05 MED ORDER — AZITHROMYCIN 250 MG PO TABS
500.0000 mg | ORAL_TABLET | Freq: Once | ORAL | Status: AC
  Administered 2016-10-05: 500 mg via ORAL
  Filled 2016-10-05: qty 2

## 2016-10-05 MED ORDER — KETOROLAC TROMETHAMINE 15 MG/ML IJ SOLN
15.0000 mg | Freq: Once | INTRAMUSCULAR | Status: AC
  Administered 2016-10-05: 15 mg via INTRAVENOUS
  Filled 2016-10-05: qty 1

## 2016-10-05 NOTE — ED Triage Notes (Signed)
Pt additionally reports she is only taking half a dose of her BP medication due to multiple reasons, delay from weather, appointments, being a care giver, etc.

## 2016-10-05 NOTE — ED Provider Notes (Signed)
[facility] DEPT Provider Note   CSN: [id number] Arrival date & time: 10/05/16  0715     History   Chief Complaint Chief Complaint  Patient presents with  . Chest Pain    HPI [name] [name] is a 66 y.o. female.  HPI   65yF with CP. Pain is in the L axilla and radiates to L scapular region, L anterior chest and L shoulder. Onset about 2 days ago. Initially mild. Has been constant since onset and significantly worse since around 0230 this morning. Worse with certain movements and deep breathing. No cough. No fevers or chills. No unusual leg pain or swelling. Denies associated symptoms such as diaphoresis, nausea or palpitations.  Past Medical History:  Diagnosis Date  . Allergic rhinitis   . Avulsion fracture of distal fibula 3/10   and sprain; Left  . Breast cancer ([place])    history of  . Coronary artery disease    Cardiac catheterization in April of 2014 at [facility]: 50% mid RCA stenosis and mild proximal disease in the left circumflex. Ejection fraction was 50%.  . History of hepatitis B    ?--tested + by [employer]  . HLD (hyperlipidemia)   . HTN (hypertension)   . Osteoarthritis   . Osteopenia     Patient Active Problem List   Diagnosis Date Noted  . Chronic diastolic heart failure ([place]) 06/18/2013  . Coronary artery disease   . OTHER MALAISE AND FATIGUE 12/28/2008  . ROTATOR CUFF SYNDROME, LEFT 12/10/2008  . Hyperlipidemia LDL goal <70 09/28/2008  . Essential hypertension 09/28/2008  . ALLERGIC RHINITIS 09/28/2008  . OSTEOARTHRITIS 09/28/2008  . DCIS (ductal carcinoma in situ), LEFT, remission 09/28/2008    Past Surgical History:  Procedure Laterality Date  . ABDOMINAL HYSTERECTOMY  1985   endometriosis; anatomy  . BREAST SURGERY  10/2002  . CARDIAC CATHETERIZATION  12/19/12   [facility]; EF 50%  . COLONOSCOPY  8/09   Hyperplastic polyp; repeat in 10 years  . HIP FRACTURE SURGERY  06/14/2011   ball replaced (Dr. Earvin Hansen)  . MASTECTOMY  2004  . TONSILLECTOMY   1958    OB History    No data available       Home Medications    Prior to Admission medications   Medication Sig Start Date End Date Taking? Authorizing Provider  aspirin 81 MG tablet Take 81 mg by mouth daily.      Historical Provider, MD  atorvastatin (LIPITOR) 20 MG tablet Take 1 tablet by mouth at  bedtime Patient not taking: Reported on 06/21/2016 07/12/15   Owens Loffler, MD  azithromycin (ZITHROMAX) 250 MG tablet 2 tabs po on day 1, then 1 tab po for 4 days 06/21/16   Owens Loffler, MD  Calcium Citrate-Vitamin D (CITRACAL + D PO) Take by mouth.    Historical Provider, MD  carvedilol (COREG) 12.5 MG tablet Take 1 tablet by mouth two  times daily Patient not taking: Reported on 06/21/2016 04/27/14   Wellington Hampshire, MD  chlorthalidone (HYGROTON) 25 MG tablet Take 1 tablet by mouth  daily 06/21/16   Owens Loffler, MD  diclofenac (VOLTAREN) 75 MG EC tablet Take 1 tablet (75 mg total) by mouth 2 (two) times daily. Patient not taking: Reported on 06/21/2016 07/12/15   Owens Loffler, MD  FLUoxetine (PROZAC) 40 MG capsule Take 1 capsule (40 mg total) by mouth daily. Patient not taking: Reported on 06/21/2016 07/12/15   Owens Loffler, MD  fluticasone (FLONASE) 50 MCG/ACT nasal spray Place  1 spray into both nostrils daily. Patient not taking: Reported on 06/21/2016 07/12/15   Owens Loffler, MD  GRAPE SEED EXTRACT PO Take by mouth. Takes 2 tablets daily.    Historical Provider, MD  losartan (COZAAR) 50 MG tablet Take 1 tablet by mouth  daily 06/21/16   Owens Loffler, MD  Misc Natural Products (OSTEO BI-FLEX ADV DOUBLE ST) TABS Take 2 tablets by mouth daily.      Historical Provider, MD  Multiple Vitamin (MULTIVITAMIN) tablet Take 1 tablet by mouth daily.      Historical Provider, MD  Multiple Vitamins-Minerals (VISION FORMULA) TABS Take 1 tablet by mouth daily.     Historical Provider, MD  OVER THE COUNTER MEDICATION Mega Red, take one by mouth daily    Historical Provider,  MD    Family History Family History  Problem Relation Age of Onset  . Heart attack Father 34  . Heart disease Father   . Sarcoidosis Mother   . Ovarian cancer      Aunt  . Diabetes      GP  . Breast cancer Sister 62  . Cancer Sister     breast  . Cancer Paternal Aunt     colon/ovarian    Social History Social History  Substance Use Topics  . Smoking status: Former Smoker    Packs/day: 0.50    Years: 10.00    Types: Cigarettes  . Smokeless tobacco: Former Systems developer    Quit date: 01/24/1986  . Alcohol use No     Allergies   Ace inhibitors; Codeine; Morphine; and Sulfonamide derivatives   Review of Systems Review of Systems  All systems reviewed and negative, other than as noted in HPI.   Physical Exam Updated Vital Signs BP (!) 193/107 (BP Location: Right Arm)   Pulse 84   Temp 97.8 F (36.6 C) (Oral)   Resp 18   Ht 5\' 5"  (1.651 m)   Wt 218 lb 9.6 oz (99.2 kg)   SpO2 95%   BMI 36.38 kg/m   Physical Exam  Constitutional: She appears well-developed and well-nourished. No distress.  HENT:  Head: Normocephalic and atraumatic.  Eyes: Conjunctivae are normal. Right eye exhibits no discharge. Left eye exhibits no discharge.  Neck: Neck supple.  Cardiovascular: Normal rate, regular rhythm and normal heart sounds.  Exam reveals no gallop and no friction rub.   No murmur heard. Pulmonary/Chest: Effort normal and breath sounds normal. No respiratory distress. She exhibits tenderness.  Tenderness palpation over the left anterior chest wall. No overlying skin changes.  Abdominal: Soft. She exhibits no distension. There is no tenderness.  Musculoskeletal: She exhibits no edema or tenderness.  Lower extremities symmetric as compared to each other. No calf tenderness. Negative Homan's. No palpable cords.   Neurological: She is alert.  Skin: Skin is warm and dry.  Psychiatric: She has a normal mood and affect. Her behavior is normal. Thought content normal.  Nursing  note and vitals reviewed.    ED Treatments / Results  Labs (all labs ordered are listed, but only abnormal results are displayed) Labs Reviewed  BASIC METABOLIC PANEL - Abnormal; Notable for the following:       Result Value   Glucose, Bld 120 (*)    All other components within normal limits  CBC - Abnormal; Notable for the following:    RBC 5.38 (*)    Hemoglobin 15.9 (*)    HCT 48.0 (*)    All other components within normal limits  Randolm Idol, ED    EKG  EKG Interpretation  Date/Time:  Thursday October 05 2016 07:19:41 EST Ventricular Rate:  84 PR Interval:    QRS Duration: 95 QT Interval:  382 QTC Calculation: 452 R Axis:     Text Interpretation:  Sinus rhythm Left ventricular hypertrophy Confirmed by Wilson Singer  MD, Annie Main [phone]) on 10/05/2016 7:34:10 AM Also confirmed by Wilson Singer  MD, Othel Dicostanzo [phone]), editor [place]  CCT, [place] ([id number])  on 10/05/2016 8:09:12 AM       Radiology Dg Chest 2 View  Result Date: 10/05/2016 CLINICAL DATA:  Chest and arm pain. EXAM: CHEST  2 VIEW COMPARISON:  10/31/2012 . FINDINGS: Cardiomegaly with mild pulmonary vascular prominence. Basilar atelectasis . No pleural effusion or pneumothorax. Surgical clips left axilla . Sclerotic densities in the left humeral head most likely old infarct. Degenerative changes both shoulders and thoracic spine . IMPRESSION: Cardiomegaly with mild pulmonary vascular prominence. Basilar atelectasis . Electronically Signed   By: Marcello Moores  Register   On: 10/05/2016 07:46    Procedures Procedures (including critical care time)  Medications Ordered in ED Medications  potassium chloride (K-DUR,KLOR-CON) CR tablet 10 mEq (not administered)  magnesium oxide (MAG-OX) tablet 400 mg (not administered)  azithromycin (ZITHROMAX) tablet 500 mg (not administered)  cefTRIAXone (ROCEPHIN) 1 g in dextrose 5 % 50 mL IVPB (not administered)  sodium chloride 0.9 % bolus 1,000 mL (0 mLs Intravenous Stopped 10/05/16 1107)    ketorolac (TORADOL) 15 MG/ML injection 15 mg (15 mg Intravenous Given 10/05/16 0927)  HYDROmorphone (DILAUDID) injection 0.5 mg (0.5 mg Intravenous Given 10/05/16 0927)  iopamidol (ISOVUE-370) 76 % injection (100 mLs  Contrast Given 10/05/16 1037)     Initial Impression / Assessment and Plan / ED Course  I have reviewed the triage vital signs and the nursing notes.  Pertinent labs & imaging results that were available during my care of the patient were reviewed by me and considered in my medical decision making (see chart for details).     65yF with L sided CP. Very atypical for ACS. Worse with movement, deep breathing and palpation. CTa noted. No PE. Possible pneumonia. I'm not clinically convinced of an infectious process but, with the pleuritic nature of her pain, will cover for possible CAP.   Final Clinical Impressions(s) / ED Diagnoses   Final diagnoses:  Left sided chest pain  Community acquired pneumonia, unspecified laterality    New Prescriptions New Prescriptions   No medications on file     Virgel Manifold, MD 10/18/16 1249

## 2016-10-05 NOTE — ED Triage Notes (Addendum)
Pt from home via [facility] with c/o left mid axilary to under left breast area throbbing and tightness since this past Monday.  Pt reports worse with breathing, coughing, movement.  Denies injury, SOB, N/V.  Took 324 mg aspirin PTA.  NAD, ambulatory, A&O.

## 2016-10-05 NOTE — ED Notes (Signed)
Patient transported to CT 

## 2016-10-12 ENCOUNTER — Encounter: Payer: Self-pay | Admitting: Family Medicine

## 2016-10-12 ENCOUNTER — Ambulatory Visit (INDEPENDENT_AMBULATORY_CARE_PROVIDER_SITE_OTHER): Payer: Medicare Other | Admitting: Family Medicine

## 2016-10-12 VITALS — BP 120/90 | HR 82 | Temp 97.7°F | Ht 65.0 in | Wt 220.5 lb

## 2016-10-12 DIAGNOSIS — J189 Pneumonia, unspecified organism: Secondary | ICD-10-CM | POA: Diagnosis not present

## 2016-10-12 MED ORDER — FLUOXETINE HCL 20 MG PO TABS: ORAL_TABLET | ORAL | 3 refills | Status: DC

## 2016-10-12 MED ORDER — LOSARTAN POTASSIUM 50 MG PO TABS: 50.0000 mg | ORAL_TABLET | Freq: Every day | ORAL | 3 refills | Status: DC

## 2016-10-12 NOTE — Progress Notes (Signed)
Dr. Frederico Hamman T. Zachery Niswander, MD, [facility] [place], [address] Phone: [phone] Fax: [phone]  10/12/2016  Patient: [name] [name], MRN: [id number], DOB: Apr 06, 1951, 66 y.o.  Primary Physician:  Owens Loffler, MD   Chief Complaint  Patient presents with  . Follow-up    ER-PNA   Subjective:   [name] [name] is a 66 y.o. very pleasant female patient who presents with the following:  F/u ER, probable PNA from CTA. Seen in ER on 1/25, dx with PNA and placed on ABX. It appears as if given Amox 500 mg po TID  Side discomfort gone, feeling really well now  Past Medical History, Surgical History, Social History, Family History, Problem List, Medications, and Allergies have been reviewed and updated if relevant.  Patient Active Problem List   Diagnosis Date Noted  . Chronic diastolic heart failure ([place]) 06/18/2013  . Coronary artery disease   . OTHER MALAISE AND FATIGUE 12/28/2008  . ROTATOR CUFF SYNDROME, LEFT 12/10/2008  . Hyperlipidemia LDL goal <70 09/28/2008  . Essential hypertension 09/28/2008  . ALLERGIC RHINITIS 09/28/2008  . OSTEOARTHRITIS 09/28/2008  . DCIS (ductal carcinoma in situ), LEFT, remission 09/28/2008    Past Medical History:  Diagnosis Date  . Allergic rhinitis   . Avulsion fracture of distal fibula 3/10   and sprain; Left  . Breast cancer ([place])    history of  . Coronary artery disease    Cardiac catheterization in April of 2014 at [facility]: 50% mid RCA stenosis and mild proximal disease in the left circumflex. Ejection fraction was 50%.  . History of hepatitis B    ?--tested + by [employer]  . HLD (hyperlipidemia)   . HTN (hypertension)   . Osteoarthritis   . Osteopenia     Past Surgical History:  Procedure Laterality Date  . ABDOMINAL HYSTERECTOMY  1985   endometriosis; anatomy  . BREAST SURGERY  10/2002  . CARDIAC CATHETERIZATION  12/19/12   [facility]; EF 50%  . COLONOSCOPY  8/09     Hyperplastic polyp; repeat in 10 years  . HIP FRACTURE SURGERY  06/14/2011   ball replaced (Dr. Earvin Hansen)  . MASTECTOMY  2004  . TONSILLECTOMY  1958    Social History   Social History  . Marital status: Married    Spouse name: (Widow) to [place]  . Number of children: N/A  . Years of education: N/A   Occupational History  . Not on file.   Social History Main Topics  . Smoking status: Former Smoker    Packs/day: 0.50    Years: 10.00    Types: Cigarettes  . Smokeless tobacco: Former Systems developer    Quit date: 01/24/1986  . Alcohol use No  . Drug use: No  . Sexual activity: Not on file   Other Topics Concern  . Not on file   Social History Narrative   Widowed to husband Renato Gails      No regular exercise    Family History  Problem Relation Age of Onset  . Heart attack Father 33  . Heart disease Father   . Sarcoidosis Mother   . Ovarian cancer      Aunt  . Diabetes      GP  . Breast cancer Sister 89  . Cancer Sister     breast  . Cancer Paternal Aunt     colon/ovarian    Allergies  Allergen Reactions  . Ace  Inhibitors Cough  . Codeine Other (See Comments)    Reation:  Hallucinations   . Morphine Other (See Comments)    Reaction:  Hallucinations   . Sulfonamide Derivatives Rash    Medication list reviewed and updated in full in [place].   GEN: recent PNA - feeling well GI: No n/v/d, eating normally Pulm: No SOB Interactive and getting along well at home.  Otherwise, ROS is as per the HPI.  Objective:   BP 120/90   Pulse 82   Temp 97.7 F (36.5 C) (Oral)   Ht 5\' 5"  (1.651 m)   Wt 220 lb 8 oz (100 kg)   BMI 36.69 kg/m   GEN: WDWN, NAD, Non-toxic, A & O x 3 HEENT: Atraumatic, Normocephalic. Neck supple. No masses, No LAD. Ears and Nose: No external deformity. CV: RRR, No M/G/R. No JVD. No thrill. No extra heart sounds. PULM: CTA B, no wheezes, crackles, rhonchi. No retractions. No resp. distress. No accessory muscle use. EXTR:  No c/c/e NEURO Normal gait.  PSYCH: Normally interactive. Conversant. Not depressed or anxious appearing.  Calm demeanor.   Laboratory and Imaging Data:  Assessment and Plan:   Community acquired pneumonia, unspecified laterality  Doing well Refill meds that she could not fill with lapse in insurance.  Follow-up: medicare cpx  Meds ordered this encounter  Medications  . FLUoxetine (PROZAC) 20 MG tablet    Sig: Take 1 tablet by mouth  daily    Dispense:  90 tablet    Refill:  3  . losartan (COZAAR) 50 MG tablet    Sig: Take 1 tablet (50 mg total) by mouth daily.    Dispense:  90 tablet    Refill:  3   Medications Discontinued During This Encounter  Medication Reason  . azithromycin (ZITHROMAX Z-PAK) 250 MG tablet Completed Course  . losartan (COZAAR) 50 MG tablet Reorder   No orders of the defined types were placed in this encounter.   Signed,  Maud Deed. Yuvonne Lanahan, MD   Allergies as of 10/12/2016      Reactions   Ace Inhibitors Cough   Codeine Other (See Comments)   Reation:  Hallucinations    Morphine Other (See Comments)   Reaction:  Hallucinations    Sulfonamide Derivatives Rash      Medication List       Accurate as of 10/12/16 10:27 AM. Always use your most recent med list.          amoxicillin 500 MG capsule Commonly known as:  AMOXIL Take 1 capsule (500 mg total) by mouth 3 (three) times daily.   aspirin EC 81 MG tablet Take 81 mg by mouth daily.   CALCIUM 600+D 600-400 MG-UNIT tablet Generic drug:  Calcium Carbonate-Vitamin D Take 1 tablet by mouth 2 (two) times daily.   FLUoxetine 20 MG tablet Commonly known as:  PROZAC Take 1 tablet by mouth  daily   fluticasone 50 MCG/ACT nasal spray Commonly known as:  FLONASE Place 1-2 sprays into both nostrils daily as needed for rhinitis.   GRAPE SEED EXTRACT PO Take 1 capsule by mouth 2 (two) times daily.   Krill Oil 1000 MG Caps Take 1,000 mg by mouth at bedtime.   losartan 50 MG  tablet Commonly known as:  COZAAR Take 1 tablet (50 mg total) by mouth daily.   multivitamin with minerals Tabs tablet Take 1 tablet by mouth daily.   OSTEO BI-FLEX ADV JOINT SHIELD Tabs Take 1 tablet by mouth  2 (two) times daily.   PRESERVISION AREDS 2+MULTI VIT Caps Take 1 capsule by mouth daily.

## 2016-10-12 NOTE — Progress Notes (Signed)
Pre visit review using our clinic review tool, if applicable. No additional management support is needed unless otherwise documented below in the visit note. 

## 2016-12-05 DIAGNOSIS — H40013 Open angle with borderline findings, low risk, bilateral: Secondary | ICD-10-CM | POA: Diagnosis not present

## 2017-03-15 ENCOUNTER — Encounter: Payer: Self-pay | Admitting: Family Medicine

## 2017-03-15 ENCOUNTER — Ambulatory Visit (INDEPENDENT_AMBULATORY_CARE_PROVIDER_SITE_OTHER): Payer: Medicare Other | Admitting: Family Medicine

## 2017-03-15 VITALS — BP 120/80 | HR 96 | Temp 97.7°F | Ht 65.0 in | Wt 224.5 lb

## 2017-03-15 DIAGNOSIS — D0512 Intraductal carcinoma in situ of left breast: Secondary | ICD-10-CM | POA: Diagnosis not present

## 2017-03-15 DIAGNOSIS — M170 Bilateral primary osteoarthritis of knee: Secondary | ICD-10-CM | POA: Diagnosis not present

## 2017-03-15 DIAGNOSIS — S90221A Contusion of right lesser toe(s) with damage to nail, initial encounter: Secondary | ICD-10-CM | POA: Diagnosis not present

## 2017-03-15 NOTE — Patient Instructions (Signed)
Generic clotrimazole Lamisil (or generic version)

## 2017-03-15 NOTE — Progress Notes (Signed)
Dr. Frederico Hamman T. Maybelline Kolarik, MD, [facility] Medicine [place], [address] Phone: [phone] Fax: [phone]  03/15/2017  Patient: [name] [name], MRN: [id number], DOB: [birth year], 66 y.o.  Primary Physician:  Owens Loffler, MD   Chief Complaint  Patient presents with  . Nail Problem    Bilateral big toes   Subjective:   [name] [name] is a 66 y.o. very pleasant female patient who presents with the following:  Several issues.  R coming off: the patient's right toenail is coming off, and it is not really causing her any significant amount of pain right now is not red, and is also not warm. L dark, toenail on the left great toe is dark, she did strike this about one week ago.  It is not really very painful right now.  Knees: she also has intermittent bilateral knee pain that is bothering her off and on.  She is taking some Osteo Bi-Flex, but otherwise is not taking any medications routinely.  She is not having any effusion or giving way.  l breast prosthesis: she had a history of breast cancer and she needs a new prosthesis for the left.  Past Medical History, Surgical History, Social History, Family History, Problem List, Medications, and Allergies have been reviewed and updated if relevant.  Patient Active Problem List   Diagnosis Date Noted  . Chronic diastolic heart failure ([place]) 06/18/2013  . Coronary artery disease   . OTHER MALAISE AND FATIGUE 12/28/2008  . ROTATOR CUFF SYNDROME, LEFT 12/10/2008  . Hyperlipidemia LDL goal <70 09/28/2008  . Essential hypertension 09/28/2008  . ALLERGIC RHINITIS 09/28/2008  . OSTEOARTHRITIS 09/28/2008  . DCIS (ductal carcinoma in situ), LEFT, remission 09/28/2008    Past Medical History:  Diagnosis Date  . Allergic rhinitis   . Avulsion fracture of distal fibula 3/10   and sprain; Left  . Breast cancer ([place])    history of  . Coronary artery disease    Cardiac catheterization  in April of 2014 at [facility]: 50% mid RCA stenosis and mild proximal disease in the left circumflex. Ejection fraction was 50%.  . History of hepatitis B    ?--tested + by [employer]  . HLD (hyperlipidemia)   . HTN (hypertension)   . Osteoarthritis   . Osteopenia     Past Surgical History:  Procedure Laterality Date  . ABDOMINAL HYSTERECTOMY  1985   endometriosis; anatomy  . BREAST SURGERY  10/2002  . CARDIAC CATHETERIZATION  12/19/12   [facility]; EF 50%  . COLONOSCOPY  8/09   Hyperplastic polyp; repeat in 10 years  . HIP FRACTURE SURGERY  06/14/2011   ball replaced (Dr. Earvin Hansen)  . MASTECTOMY  2004  . TONSILLECTOMY  1958    Social History   Social History  . Marital status: Married    Spouse name: (Widow) to [place]  . Number of children: N/A  . Years of education: N/A   Occupational History  . Not on file.   Social History Main Topics  . Smoking status: Former Smoker    Packs/day: 0.50    Years: 10.00    Types: Cigarettes  . Smokeless tobacco: Former Systems developer    Quit date: 01/24/1986  . Alcohol use No  . Drug use: No  . Sexual activity: Not on file   Other Topics Concern  . Not on file   Social History Narrative   Widowed to husband Renato Gails  No regular exercise    Family History  Problem Relation Age of Onset  . Heart attack Father 20  . Heart disease Father   . Sarcoidosis Mother   . Ovarian cancer Unknown        Aunt  . Diabetes Unknown        GP  . Breast cancer Sister 35  . Cancer Sister        breast  . Cancer Paternal Aunt        colon/ovarian    Allergies  Allergen Reactions  . Ace Inhibitors Cough  . Codeine Other (See Comments)    Reation:  Hallucinations   . Morphine Other (See Comments)    Reaction:  Hallucinations   . Sulfonamide Derivatives Rash    Medication list reviewed and updated in full in [place].   GEN: No acute illnesses, no fevers, chills. GI: No n/v/d, eating normally Pulm: No SOB Interactive and  getting along well at home.  Otherwise, ROS is as per the HPI.  Objective:   BP 120/80   Pulse 96   Temp 97.7 F (36.5 C) (Oral)   Ht 5\' 5"  (1.651 m)   Wt 224 lb 8 oz (101.8 kg)   BMI 37.36 kg/m   GEN: WDWN, NAD, Non-toxic, A & O x 3 HEENT: Atraumatic, Normocephalic. Neck supple. No masses, No LAD. Ears and Nose: No external deformity. CV: RRR, No M/G/R. No JVD. No thrill. No extra heart sounds. PULM: CTA B, no wheezes, crackles, rhonchi. No retractions. No resp. distress. No accessory muscle use. EXTR: No c/c/e NEURO Normal gait.  PSYCH: Normally interactive. Conversant. Not depressed or anxious appearing.  Calm demeanor.   Left toenail peels darkened, but it is not tender to palpation.  The right toenail on the great toe is almost entirely off.  She does have tenderness on the medial joint line in each of her knees as well as to a lesser extent on the lateral joint line.  There is also some patellar crepitus.  MCL, LCL, ACL, and PCL are all stable.  Laboratory and Imaging Data:  Assessment and Plan:   Subungual hematoma of toe of right foot, initial encounter  Arthritis of both knees  Ductal carcinoma in situ (DCIS) of left breast  I gave her a prescription for her prosthesis.  I reassured her regarding her knees and it would be reasonable for her to take some Tylenol or ice her knees if needed.  Nothing further regarding toenails.  Follow-up: Return in about 3 months (around 06/15/2017) for Medicare Wellness Exam.  Signed,  Frederico Hamman T. Rafel Garde, MD   Allergies as of 03/15/2017      Reactions   Ace Inhibitors Cough   Codeine Other (See Comments)   Reation:  Hallucinations    Morphine Other (See Comments)   Reaction:  Hallucinations    Sulfonamide Derivatives Rash      Medication List       Accurate as of 03/15/17 11:59 PM. Always use your most recent med list.          amoxicillin 500 MG capsule Commonly known as:  AMOXIL Take 1 capsule (500 mg total) by  mouth 3 (three) times daily.   aspirin EC 81 MG tablet Take 81 mg by mouth daily.   CALCIUM 600+D 600-400 MG-UNIT tablet Generic drug:  Calcium Carbonate-Vitamin D Take 1 tablet by mouth 2 (two) times daily.   FLUoxetine 20 MG tablet Commonly known as:  PROZAC Take 1  tablet by mouth  daily   fluticasone 50 MCG/ACT nasal spray Commonly known as:  FLONASE Place 1-2 sprays into both nostrils daily as needed for rhinitis.   GRAPE SEED EXTRACT PO Take 1 capsule by mouth 2 (two) times daily.   Krill Oil 1000 MG Caps Take 1,000 mg by mouth at bedtime.   losartan 50 MG tablet Commonly known as:  COZAAR Take 1 tablet (50 mg total) by mouth daily.   multivitamin with minerals Tabs tablet Take 1 tablet by mouth daily.   OSTEO BI-FLEX ADV JOINT SHIELD Tabs Take 1 tablet by mouth 2 (two) times daily.   PRESERVISION AREDS 2+MULTI VIT Caps Take 1 capsule by mouth daily.

## 2017-06-04 DIAGNOSIS — H40013 Open angle with borderline findings, low risk, bilateral: Secondary | ICD-10-CM | POA: Diagnosis not present

## 2017-09-24 ENCOUNTER — Encounter: Payer: Self-pay | Admitting: Family Medicine

## 2017-09-24 ENCOUNTER — Other Ambulatory Visit: Payer: Self-pay

## 2017-09-24 ENCOUNTER — Ambulatory Visit (INDEPENDENT_AMBULATORY_CARE_PROVIDER_SITE_OTHER): Payer: Medicare Other | Admitting: Family Medicine

## 2017-09-24 VITALS — BP 140/90 | HR 98 | Temp 98.5°F | Ht 65.0 in | Wt 223.5 lb

## 2017-09-24 DIAGNOSIS — J189 Pneumonia, unspecified organism: Secondary | ICD-10-CM

## 2017-09-24 MED ORDER — DOXYCYCLINE HYCLATE 100 MG PO TABS: 100.0000 mg | ORAL_TABLET | Freq: Two times a day (BID) | ORAL | 0 refills | Status: AC

## 2017-09-24 NOTE — Progress Notes (Signed)
Dr. Frederico Hamman T. Kiwanna Spraker, MD, [facility] and Sports Medicine [place], [address] Phone: [phone] Fax: [phone]  09/24/2017  Patient: [name] [name], MRN: [id number], DOB: July 18, 1951, 67 y.o.  Primary Physician:  Owens Loffler, MD   Chief Complaint  Patient presents with  . Cough  . Headache  . Check toes on left foot   Subjective:   [name] [name] is a 67 y.o. very pleasant female patient who presents with the following:  Has a sore throat, runny nose and ha, has been sick since a month and never really went away.  She generally feels well and has a bad cough. No fever. Multiple med probs per below.   Also had some tingling in her foot with prior amputation. Brother said he had   Past Medical History, Surgical History, Social History, Family History, Problem List, Medications, and Allergies have been reviewed and updated if relevant.  Patient Active Problem List   Diagnosis Date Noted  . Chronic diastolic heart failure ([place]) 06/18/2013  . Coronary artery disease   . OTHER MALAISE AND FATIGUE 12/28/2008  . ROTATOR CUFF SYNDROME, LEFT 12/10/2008  . Hyperlipidemia LDL goal <70 09/28/2008  . Essential hypertension 09/28/2008  . ALLERGIC RHINITIS 09/28/2008  . OSTEOARTHRITIS 09/28/2008  . DCIS (ductal carcinoma in situ), LEFT, remission 09/28/2008    Past Medical History:  Diagnosis Date  . Allergic rhinitis   . Avulsion fracture of distal fibula 3/10   and sprain; Left  . Breast cancer ([place])    history of  . Coronary artery disease    Cardiac catheterization in April of 2014 at [facility]: 50% mid RCA stenosis and mild proximal disease in the left circumflex. Ejection fraction was 50%.  . History of hepatitis B    ?--tested + by [employer]  . HLD (hyperlipidemia)   . HTN (hypertension)   . Osteoarthritis   . Osteopenia     Past Surgical History:  Procedure Laterality Date  . ABDOMINAL HYSTERECTOMY  1985   endometriosis; anatomy  . BREAST SURGERY  10/2002  . CARDIAC CATHETERIZATION  12/19/12   [facility]; EF 50%  . COLONOSCOPY  8/09   Hyperplastic polyp; repeat in 10 years  . HIP FRACTURE SURGERY  06/14/2011   ball replaced (Dr. Earvin Hansen)  . MASTECTOMY  2004  . TONSILLECTOMY  1958    Social History   Socioeconomic History  . Marital status: Married    Spouse name: (Widow) to [place]  . Number of children: Not on file  . Years of education: Not on file  . Highest education level: Not on file  Social Needs  . Financial resource strain: Not on file  . Food insecurity - worry: Not on file  . Food insecurity - inability: Not on file  . Transportation needs - medical: Not on file  . Transportation needs - non-medical: Not on file  Occupational History  . Not on file  Tobacco Use  . Smoking status: Former Smoker    Packs/day: 0.50    Years: 10.00    Pack years: 5.00    Types: Cigarettes  . Smokeless tobacco: Former Systems developer    Quit date: 01/24/1986  Substance and Sexual Activity  . Alcohol use: No  . Drug use: No  . Sexual activity: Not on file  Other Topics Concern  . Not on file  Social History Narrative   Widowed to husband Renato Gails  No regular exercise    Family History  Problem Relation Age of Onset  . Heart attack Father 76  . Heart disease Father   . Sarcoidosis Mother   . Ovarian cancer Unknown        Aunt  . Diabetes Unknown        GP  . Breast cancer Sister 60  . Cancer Sister        breast  . Cancer Paternal Aunt        colon/ovarian    Allergies  Allergen Reactions  . Ace Inhibitors Cough  . Codeine Other (See Comments)    Reation:  Hallucinations   . Morphine Other (See Comments)    Reaction:  Hallucinations   . Sulfonamide Derivatives Rash    Medication list reviewed and updated in full in [place].   GEN: No acute illnesses, no fevers, chills. GI: No n/v/d, eating normally Pulm: No SOB Interactive and getting along well at  home.  Otherwise, ROS is as per the HPI.  Objective:   BP 140/90   Pulse 98   Temp 98.5 F (36.9 C) (Oral)   Ht 5\' 5"  (1.651 m)   Wt 223 lb 8 oz (101.4 kg)   SpO2 94%   BMI 37.19 kg/m   GEN: WDWN, NAD, Non-toxic, A & O x 3 HEENT: Atraumatic, Normocephalic. Neck supple. No masses, No LAD. Ears and Nose: No external deformity. CV: RRR, No M/G/R. No JVD. No thrill. No extra heart sounds. PULM: CTA B, no wheezes, crackles, rhonchi. No retractions. No resp. distress. No accessory muscle use. EXTR: No c/c/e NEURO Normal gait.  PSYCH: Normally interactive. Conversant. Not depressed or anxious appearing.  Calm demeanor.   Laboratory and Imaging Data:  Assessment and Plan:   Atypical pneumonia  > 4 weeks pulm illness, cover atypical pathogens in this case.  Supportive home care.  Follow-up: No Follow-up on file.  Meds ordered this encounter  Medications  . doxycycline (VIBRA-TABS) 100 MG tablet    Sig: Take 1 tablet (100 mg total) by mouth 2 (two) times daily for 10 days.    Dispense:  20 tablet    Refill:  0   Medications Discontinued During This Encounter  Medication Reason  . amoxicillin (AMOXIL) 500 MG capsule Completed Course   No orders of the defined types were placed in this encounter.   Signed,  Maud Deed. Cassidey Barrales, MD   Allergies as of 09/24/2017      Reactions   Ace Inhibitors Cough   Codeine Other (See Comments)   Reation:  Hallucinations    Morphine Other (See Comments)   Reaction:  Hallucinations    Sulfonamide Derivatives Rash      Medication List        Accurate as of 09/24/17  2:04 PM. Always use your most recent med list.          aspirin EC 81 MG tablet Take 81 mg by mouth daily.   CALCIUM 600+D 600-400 MG-UNIT tablet Generic drug:  Calcium Carbonate-Vitamin D Take 1 tablet by mouth 2 (two) times daily.   doxycycline 100 MG tablet Commonly known as:  VIBRA-TABS Take 1 tablet (100 mg total) by mouth 2 (two) times daily for 10  days.   FLUoxetine 20 MG tablet Commonly known as:  PROZAC Take 1 tablet by mouth  daily   fluticasone 50 MCG/ACT nasal spray Commonly known as:  FLONASE Place 1-2 sprays into both nostrils daily as needed for rhinitis.  GRAPE SEED EXTRACT PO Take 1 capsule by mouth 2 (two) times daily.   Krill Oil 1000 MG Caps Take 1,000 mg by mouth at bedtime.   losartan 50 MG tablet Commonly known as:  COZAAR Take 1 tablet (50 mg total) by mouth daily.   multivitamin with minerals Tabs tablet Take 1 tablet by mouth daily.   OSTEO BI-FLEX ADV JOINT SHIELD Tabs Take 1 tablet by mouth 2 (two) times daily.   PRESERVISION AREDS 2+MULTI VIT Caps Take 1 capsule by mouth daily.

## 2017-12-05 ENCOUNTER — Telehealth: Payer: Self-pay

## 2017-12-05 DIAGNOSIS — E559 Vitamin D deficiency, unspecified: Secondary | ICD-10-CM

## 2017-12-05 DIAGNOSIS — E785 Hyperlipidemia, unspecified: Secondary | ICD-10-CM

## 2017-12-05 DIAGNOSIS — I10 Essential (primary) hypertension: Secondary | ICD-10-CM

## 2017-12-05 NOTE — Telephone Encounter (Signed)
CPE labs ordered. Last CPE labs in 2016. Orders sent to PCP for approval. Insurance: Medicare-traditional.

## 2017-12-06 ENCOUNTER — Ambulatory Visit (INDEPENDENT_AMBULATORY_CARE_PROVIDER_SITE_OTHER): Payer: Medicare Other

## 2017-12-06 VITALS — BP 150/100 | HR 54 | Temp 97.4°F | Ht 65.0 in | Wt 221.5 lb

## 2017-12-06 DIAGNOSIS — I10 Essential (primary) hypertension: Secondary | ICD-10-CM

## 2017-12-06 DIAGNOSIS — Z23 Encounter for immunization: Secondary | ICD-10-CM | POA: Diagnosis not present

## 2017-12-06 DIAGNOSIS — Z0000 Encounter for general adult medical examination without abnormal findings: Secondary | ICD-10-CM

## 2017-12-06 DIAGNOSIS — E785 Hyperlipidemia, unspecified: Secondary | ICD-10-CM

## 2017-12-06 DIAGNOSIS — E559 Vitamin D deficiency, unspecified: Secondary | ICD-10-CM | POA: Diagnosis not present

## 2017-12-06 LAB — CBC WITH DIFFERENTIAL/PLATELET
Basophils Absolute: 0.1 10*3/uL (ref 0.0–0.1)
Basophils Relative: 0.8 % (ref 0.0–3.0)
Eosinophils Absolute: 0.3 10*3/uL (ref 0.0–0.7)
Eosinophils Relative: 3.9 % (ref 0.0–5.0)
HCT: 46 % (ref 36.0–46.0)
Hemoglobin: 15.4 g/dL — ABNORMAL HIGH (ref 12.0–15.0)
Lymphocytes Relative: 28.8 % (ref 12.0–46.0)
Lymphs Abs: 2.3 10*3/uL (ref 0.7–4.0)
MCHC: 33.4 g/dL (ref 30.0–36.0)
MCV: 88.2 fl (ref 78.0–100.0)
Monocytes Absolute: 0.7 10*3/uL (ref 0.1–1.0)
Monocytes Relative: 9.1 % (ref 3.0–12.0)
Neutro Abs: 4.6 10*3/uL (ref 1.4–7.7)
Neutrophils Relative %: 57.4 % (ref 43.0–77.0)
Platelets: 313 10*3/uL (ref 150.0–400.0)
RBC: 5.22 Mil/uL — ABNORMAL HIGH (ref 3.87–5.11)
RDW: 14.5 % (ref 11.5–15.5)
WBC: 8 10*3/uL (ref 4.0–10.5)

## 2017-12-06 LAB — COMPREHENSIVE METABOLIC PANEL
ALT: 61 U/L — ABNORMAL HIGH (ref 0–35)
AST: 45 U/L — ABNORMAL HIGH (ref 0–37)
Albumin: 4.3 g/dL (ref 3.5–5.2)
Alkaline Phosphatase: 75 U/L (ref 39–117)
BUN: 10 mg/dL (ref 6–23)
CO2: 31 mEq/L (ref 19–32)
Calcium: 9.9 mg/dL (ref 8.4–10.5)
Chloride: 101 mEq/L (ref 96–112)
Creatinine, Ser: 0.7 mg/dL (ref 0.40–1.20)
GFR: 88.86 mL/min (ref >60.00)
Glucose, Bld: 127 mg/dL — ABNORMAL HIGH (ref 70–99)
Potassium: 5 mEq/L (ref 3.5–5.1)
Sodium: 140 mEq/L (ref 135–145)
Total Bilirubin: 0.5 mg/dL (ref 0.2–1.2)
Total Protein: 7.3 g/dL (ref 6.0–8.3)

## 2017-12-06 LAB — VITAMIN D 25 HYDROXY (VIT D DEFICIENCY, FRACTURES): VITD: 32.8 ng/mL (ref 30.00–100.00)

## 2017-12-06 LAB — LIPID PANEL
Cholesterol: 252 mg/dL — ABNORMAL HIGH (ref 0–200)
HDL: 38.6 mg/dL — ABNORMAL LOW (ref >39.00)
NonHDL: 213.72
Total CHOL/HDL Ratio: 7
Triglycerides: 399 mg/dL — ABNORMAL HIGH (ref 0.0–149.0)
VLDL: 79.8 mg/dL — ABNORMAL HIGH (ref 0.0–40.0)

## 2017-12-06 LAB — LDL CHOLESTEROL, DIRECT: Direct LDL: 60 mg/dL

## 2017-12-06 LAB — TSH: TSH: 3.53 u[IU]/mL (ref 0.35–4.50)

## 2017-12-06 NOTE — Patient Instructions (Signed)
[name] [name] , Thank you for taking time to come for your Medicare Wellness Visit. I appreciate your ongoing commitment to your health goals. Please review the following plan we discussed and let me know if I can assist you in the future.   These are the goals we discussed: Goals    . Increase physical activity     Starting 12/10/2017, I will attempt to exercise up to 30 minutes 3 days per week.        This is a list of the screening recommended for you and due dates:  Health Maintenance  Topic Date Due  . Mammogram  09/10/2018*  . Colon Cancer Screening  09/10/2018*  . Pneumonia vaccines (2 of 2 - PPSV23) 12/07/2018  . Tetanus Vaccine  08/31/2020  . Flu Shot  Completed  . DEXA scan (bone density measurement)  Completed  .  Hepatitis C: One time screening is recommended by Center for Disease Control  (CDC) for  adults born from [birth year] through 1965.   Completed  *Topic was postponed. The date shown is not the original due date.   Preventive Care for Adults  A healthy lifestyle and preventive care can promote health and wellness. Preventive health guidelines for adults include the following key practices.  . A routine yearly physical is a good way to check with your health care provider about your health and preventive screening. It is a chance to share any concerns and updates on your health and to receive a thorough exam.  . Visit your dentist for a routine exam and preventive care every 6 months. Brush your teeth twice a day and floss once a day. Good oral hygiene prevents tooth decay and gum disease.  . The frequency of eye exams is based on your age, health, family medical history, use  of contact lenses, and other factors. Follow your health care provider's recommendations for frequency of eye exams.  . Eat a healthy diet. Foods like vegetables, fruits, whole grains, low-fat dairy products, and lean protein foods contain the nutrients you need without too many calories. Decrease  your intake of foods high in solid fats, added sugars, and salt. Eat the right amount of calories for you. Get information about a proper diet from your health care provider, if necessary.  . Regular physical exercise is one of the most important things you can do for your health. Most adults should get at least 150 minutes of moderate-intensity exercise (any activity that increases your heart rate and causes you to sweat) each week. In addition, most adults need muscle-strengthening exercises on 2 or more days a week.  Silver Sneakers may be a benefit available to you. To determine eligibility, you may visit the website: www.silversneakers.com or contact program at (934)797-8090 Mon-Fri between 8AM-8PM.   . Maintain a healthy weight. The body mass index (BMI) is a screening tool to identify possible weight problems. It provides an estimate of body fat based on height and weight. Your health care provider can find your BMI and can help you achieve or maintain a healthy weight.   For adults 20 years and older: ? A BMI below 18.5 is considered underweight. ? A BMI of 18.5 to 24.9 is normal. ? A BMI of 25 to 29.9 is considered overweight. ? A BMI of 30 and above is considered obese.   . Maintain normal blood lipids and cholesterol levels by exercising and minimizing your intake of saturated fat. Eat a balanced diet with plenty of fruit and  vegetables. Blood tests for lipids and cholesterol should begin at age 13 and be repeated every 5 years. If your lipid or cholesterol levels are high, you are over 50, or you are at high risk for heart disease, you may need your cholesterol levels checked more frequently. Ongoing high lipid and cholesterol levels should be treated with medicines if diet and exercise are not working.  . If you smoke, find out from your health care provider how to quit. If you do not use tobacco, please do not start.  . If you choose to drink alcohol, please do not consume more than  2 drinks per day. One drink is considered to be 12 ounces (355 mL) of beer, 5 ounces (148 mL) of wine, or 1.5 ounces (44 mL) of liquor.  . If you are 54-1 years old, ask your health care provider if you should take aspirin to prevent strokes.  . Use sunscreen. Apply sunscreen liberally and repeatedly throughout the day. You should seek shade when your shadow is shorter than you. Protect yourself by wearing long sleeves, pants, a wide-brimmed hat, and sunglasses year round, whenever you are outdoors.  . Once a month, do a whole body skin exam, using a mirror to look at the skin on your back. Tell your health care provider of new moles, moles that have irregular borders, moles that are larger than a pencil eraser, or moles that have changed in shape or color.

## 2017-12-06 NOTE — Progress Notes (Signed)
PCP notes:   Health maintenance:  Mammogram - PCP please address at next appt Colon cancer screening - PCP please address at next appt PCV13 - administered Flu vaccine - administered  Abnormal screenings:   Depression score: 5 Depression screen [facility] 2/9 12/06/2017  Decreased Interest 1  Down, Depressed, Hopeless 1  PHQ - 2 Score 2  Altered sleeping 1  Tired, decreased energy 1  Change in appetite 1  Feeling bad or failure about yourself  0  Trouble concentrating 0  Moving slowly or fidgety/restless 0  Suicidal thoughts 0  PHQ-9 Score 5  Difficult doing work/chores Not difficult at all   Patient concerns:   Patient has requested to have a Well Woman Exam G0101 at next appt with PCP.  Nurse concerns:  BP elevated at 150/100. Patient states she takes BP medication at bedtime.   Next PCP appt:   12/19/17 @ 1400

## 2017-12-06 NOTE — Progress Notes (Signed)
I reviewed health advisor's note, was available for consultation, and agree with documentation and plan.   Signed,  Donnika Kucher T. Jaylei Fuerte, MD  

## 2017-12-06 NOTE — Progress Notes (Signed)
Subjective:   [name] [name] is a 67 y.o. female who presents for an Initial Medicare Annual Wellness Visit.  Review of Systems    N/A  Cardiac Risk Factors include: advanced age (>90men, >11 women);obesity (BMI >30kg/m2);dyslipidemia;hypertension     Objective:    Today's Vitals   12/06/17 0812  BP: (!) 150/100  Pulse: (!) 54  Temp: (!) 97.4 F (36.3 C)  TempSrc: Oral  SpO2: 96%  Weight: 221 lb 8 oz (100.5 kg)  Height: 5\' 5"  (1.651 m)  PainSc: 0-No pain   Body mass index is 36.86 kg/m.  Advanced Directives 12/06/2017 10/05/2016 03/22/2015  Does Patient Have a Medical Advance Directive? No No No  Would patient like information on creating a medical advance directive? No - Patient declined - Yes - Educational materials given    Current Medications (verified) Outpatient Encounter Medications as of 12/06/2017  Medication Sig  . aspirin EC 81 MG tablet Take 81 mg by mouth daily.  . Calcium Carbonate-Vitamin D (CALCIUM 600+D) 600-400 MG-UNIT tablet Take 1 tablet by mouth 2 (two) times daily.  [name] Kitchen FLUoxetine (PROZAC) 20 MG tablet Take 1 tablet by mouth  daily  . fluticasone (FLONASE) 50 MCG/ACT nasal spray Place 1-2 sprays into both nostrils daily as needed for rhinitis.  [name] Kitchen GRAPE SEED EXTRACT PO Take 1 capsule by mouth 2 (two) times daily.   Javier Docker Oil 1000 MG CAPS Take 1,000 mg by mouth at bedtime.  [name] Kitchen losartan (COZAAR) 50 MG tablet Take 1 tablet (50 mg total) by mouth daily.  . Misc Natural Products (OSTEO BI-FLEX ADV JOINT SHIELD) TABS Take 1 tablet by mouth 2 (two) times daily.  . Multiple Vitamin (MULTIVITAMIN WITH MINERALS) TABS tablet Take 1 tablet by mouth daily.  . Multiple Vitamins-Minerals (PRESERVISION AREDS 2+MULTI VIT) CAPS Take 1 capsule by mouth daily.   No facility-administered encounter medications on file as of 12/06/2017.     Allergies (verified) Ace inhibitors; Codeine; Morphine; and Sulfonamide derivatives   History: Past Medical History:  Diagnosis  Date  . Allergic rhinitis   . Avulsion fracture of distal fibula 3/10   and sprain; Left  . Breast cancer ([place])    history of  . Coronary artery disease    Cardiac catheterization in April of 2014 at [facility]: 50% mid RCA stenosis and mild proximal disease in the left circumflex. Ejection fraction was 50%.  . History of hepatitis B    ?--tested + by [facility]  . HLD (hyperlipidemia)   . HTN (hypertension)   . Osteoarthritis   . Osteopenia    Past Surgical History:  Procedure Laterality Date  . ABDOMINAL HYSTERECTOMY  1985   endometriosis; anatomy  . BREAST SURGERY  10/2002  . CARDIAC CATHETERIZATION  12/19/12   [facility]; EF 50%  . COLONOSCOPY  8/09   Hyperplastic polyp; repeat in 10 years  . HIP FRACTURE SURGERY  06/14/2011   ball replaced (Dr. Earvin Hansen)  . MASTECTOMY  2004  . TONSILLECTOMY  1958   Family History  Problem Relation Age of Onset  . Heart attack Father 15  . Heart disease Father   . Sarcoidosis Mother   . Ovarian cancer Unknown        Aunt  . Diabetes Unknown        GP  . Breast cancer Sister 2  . Cancer Sister        breast  . Cancer Paternal Aunt        colon/ovarian   Social  History   Socioeconomic History  . Marital status: Married    Spouse name: (Widow) to [place]  . Number of children: Not on file  . Years of education: Not on file  . Highest education level: Not on file  Occupational History  . Not on file  Social Needs  . Financial resource strain: Not on file  . Food insecurity:    Worry: Not on file    Inability: Not on file  . Transportation needs:    Medical: Not on file    Non-medical: Not on file  Tobacco Use  . Smoking status: Former Smoker    Packs/day: 0.50    Years: 10.00    Pack years: 5.00    Types: Cigarettes  . Smokeless tobacco: Former Systems developer    Quit date: 01/24/1986  Substance and Sexual Activity  . Alcohol use: No  . Drug use: No  . Sexual activity: Not on file  Lifestyle  . Physical activity:    Days per week: Not on  file    Minutes per session: Not on file  . Stress: Not on file  Relationships  . Social connections:    Talks on phone: Not on file    Gets together: Not on file    Attends religious service: Not on file    Active member of club or organization: Not on file    Attends meetings of clubs or organizations: Not on file    Relationship status: Not on file  Other Topics Concern  . Not on file  Social History Narrative   Widowed to husband Renato Gails      No regular exercise    Tobacco Counseling Counseling given: No   Clinical Intake:  Pre-visit preparation completed: Yes  Pain : No/denies pain Pain Score: 0-No pain     Nutritional Status: BMI > 30  Obese Nutritional Risks: None Diabetes: No  How often do you need to have someone help you when you read instructions, pamphlets, or other written materials from your doctor or pharmacy?: 1 - Never What is the last grade level you completed in school?: 12th grade  Interpreter Needed?: No  Information entered by :: LPinson, LPN   Activities of Daily Living In your present state of health, do you have any difficulty performing the following activities: 12/06/2017  Hearing? Y  Vision? N  Difficulty concentrating or making decisions? N  Walking or climbing stairs? N  Dressing or bathing? N  Doing errands, shopping? N  Preparing Food and eating ? N  Using the Toilet? N  In the past six months, have you accidently leaked urine? N  Do you have problems with loss of bowel control? N  Managing your Medications? N  Managing your Finances? N  Housekeeping or managing your Housekeeping? N  Some recent data might be hidden     Immunizations and Health Maintenance Immunization History  Administered Date(s) Administered  . Influenza Split 07/10/2011  . Influenza Whole 08/11/2008, 10/11/2009, 08/31/2010  . Influenza,inj,Quad PF,6+ Mos 05/21/2013, 06/23/2014, 07/12/2015, 12/06/2017  . Pneumococcal Conjugate-13  12/06/2017  . Td 08/31/2010   There are no preventive care reminders to display for this patient.  Patient Care Team: Owens Loffler, MD as PCP - General  Indicate any recent Medical Services you may have received from other than [facility] providers in the past year (date may be approximate).     Assessment:   This is a routine wellness examination for Tyla.  Hearing/Vision screen  Hearing Screening Comments: Bilateral hearing aids Vision Screening Comments: Last vision exam in 2018 with Dr. Herbert Deaner  Dietary issues and exercise activities discussed: Current Exercise Habits: The patient does not participate in regular exercise at present, Exercise limited by: None identified  Goals    . Increase physical activity     Starting 12/10/2017, I will attempt to exercise up to 30 minutes 3 days per week.       Depression Screen PHQ 2/9 Scores 12/06/2017  PHQ - 2 Score 2  PHQ- 9 Score 5    Fall Risk Fall Risk  12/06/2017  Falls in the past year? No    Cognitive Function: MMSE - Mini Mental State Exam 12/06/2017  Orientation to time 5  Orientation to Place 5  Registration 3  Attention/ Calculation 0  Recall 3  Language- name 2 objects 0  Language- repeat 1  Language- follow 3 step command 3  Language- read & follow direction 0  Write a sentence 0  Copy design 0  Total score 20     PLEASE NOTE: A Mini-Cog screen was completed. Maximum score is 20. A value of 0 denotes this part of [place] MMSE was not completed or the patient failed this part of the Mini-Cog screening.   Mini-Cog Screening Orientation to Time - Max 5 pts Orientation to Place - Max 5 pts Registration - Max 3 pts Recall - Max 3 pts Language Repeat - Max 1 pts Language Follow 3 Step Command - Max 3 pts     Screening Tests Health Maintenance  Topic Date Due  . MAMMOGRAM  09/10/2018 (Originally 02/21/2016)  . COLONOSCOPY  09/10/2018 (Originally 05/09/2016)  . PNA vac Low Risk Adult (2 of 2 - PPSV23)  12/07/2018  . TETANUS/TDAP  08/31/2020  . INFLUENZA VACCINE  Completed  . DEXA SCAN  Completed  . Hepatitis C Screening  Completed      Plan:     I have personally reviewed, addressed, and noted the following in the patient's chart:  A. Medical and social history B. Use of alcohol, tobacco or illicit drugs  C. Current medications and supplements D. Functional ability and status E.  Nutritional status F.  Physical activity G. Advance directives H. List of other physicians I.  Hospitalizations, surgeries, and ER visits in previous 12 months J.  [place] to include hearing, vision, cognitive, depression L. Referrals and appointments - none  In addition, I have reviewed and discussed with patient certain preventive protocols, quality metrics, and best practice recommendations. A written personalized care plan for preventive services as well as general preventive health recommendations were provided to patient.  See attached scanned questionnaire for additional information.   Signed,   Lindell Noe, MHA, BS, LPN Health Coach

## 2017-12-10 DIAGNOSIS — H353132 Nonexudative age-related macular degeneration, bilateral, intermediate dry stage: Secondary | ICD-10-CM | POA: Diagnosis not present

## 2017-12-10 DIAGNOSIS — H2513 Age-related nuclear cataract, bilateral: Secondary | ICD-10-CM | POA: Diagnosis not present

## 2017-12-10 DIAGNOSIS — H40013 Open angle with borderline findings, low risk, bilateral: Secondary | ICD-10-CM | POA: Diagnosis not present

## 2017-12-10 DIAGNOSIS — H25013 Cortical age-related cataract, bilateral: Secondary | ICD-10-CM | POA: Diagnosis not present

## 2017-12-19 ENCOUNTER — Encounter: Payer: Medicare Other | Admitting: Family Medicine

## 2017-12-19 ENCOUNTER — Encounter: Payer: Self-pay | Admitting: Family Medicine

## 2017-12-19 ENCOUNTER — Ambulatory Visit (INDEPENDENT_AMBULATORY_CARE_PROVIDER_SITE_OTHER): Payer: Medicare Other | Admitting: Family Medicine

## 2017-12-19 ENCOUNTER — Telehealth: Payer: Self-pay

## 2017-12-19 ENCOUNTER — Other Ambulatory Visit: Payer: Self-pay

## 2017-12-19 VITALS — BP 130/80 | HR 87 | Temp 97.8°F | Ht 65.0 in | Wt 220.5 lb

## 2017-12-19 DIAGNOSIS — Z1239 Encounter for other screening for malignant neoplasm of breast: Secondary | ICD-10-CM

## 2017-12-19 DIAGNOSIS — D0512 Intraductal carcinoma in situ of left breast: Secondary | ICD-10-CM | POA: Diagnosis not present

## 2017-12-19 DIAGNOSIS — I10 Essential (primary) hypertension: Secondary | ICD-10-CM

## 2017-12-19 DIAGNOSIS — E785 Hyperlipidemia, unspecified: Secondary | ICD-10-CM

## 2017-12-19 DIAGNOSIS — Z1211 Encounter for screening for malignant neoplasm of colon: Secondary | ICD-10-CM

## 2017-12-19 DIAGNOSIS — Z1231 Encounter for screening mammogram for malignant neoplasm of breast: Secondary | ICD-10-CM

## 2017-12-19 DIAGNOSIS — R7989 Other specified abnormal findings of blood chemistry: Secondary | ICD-10-CM

## 2017-12-19 DIAGNOSIS — R739 Hyperglycemia, unspecified: Secondary | ICD-10-CM

## 2017-12-19 DIAGNOSIS — R945 Abnormal results of liver function studies: Secondary | ICD-10-CM

## 2017-12-19 DIAGNOSIS — Z9012 Acquired absence of left breast and nipple: Secondary | ICD-10-CM | POA: Diagnosis not present

## 2017-12-19 LAB — HEMOGLOBIN A1C: Hgb A1c MFr Bld: 5.9 % (ref 4.6–6.5)

## 2017-12-19 NOTE — Telephone Encounter (Signed)
Left message for patient to call [name] back in regards to a referral-[name] [name] [name], RMA   

## 2017-12-19 NOTE — Progress Notes (Signed)
Dr. Frederico Hamman T. Yanai Hobson, MD, [facility] [place], [address] Phone: [phone] Fax: [phone]  12/19/2017  Patient: [name] [name], MRN: [id number], DOB: Jan 09, 1951, 67 y.o.  Primary Physician:  Owens Loffler, MD   Chief Complaint  Patient presents with  . Annual Exam    Part 2   Subjective:   CELESTE TAVENNER is a 67 y.o. very pleasant female patient who presents with the following:  F/u multiple medical problems.   She is here today in follow-up, she recently had her Medicare wellness exam.  I had not seen her any sort of chronic longitudinal follow-up in some time.  She has had mild coronary disease as well as chronic diastolic heart failure.  She is compliant with her medication.  Blood pressure has been stable, and she is compliant with taking her Cozaar.  She also does have some hyperlipidemia.  She is status post  on the left, status post DCIS.  Mastectomy.  mammo Breast mammo - R sided mammo Breast exam  Colon cancer screening:  LFT's mildly elevated.  Hep B? She was told in the past that she had hepatitis? a1c?  Past Medical History, Surgical History, Social History, Family History, Problem List, Medications, and Allergies have been reviewed and updated if relevant.  Patient Active Problem List   Diagnosis Date Noted  . Chronic diastolic heart failure ([place]) 06/18/2013  . Coronary artery disease   . OTHER MALAISE AND FATIGUE 12/28/2008  . ROTATOR CUFF SYNDROME, LEFT 12/10/2008  . Hyperlipidemia LDL goal <70 09/28/2008  . Essential hypertension 09/28/2008  . ALLERGIC RHINITIS 09/28/2008  . OSTEOARTHRITIS 09/28/2008  . DCIS (ductal carcinoma in situ), LEFT, remission 09/28/2008    Past Medical History:  Diagnosis Date  . Allergic rhinitis   . Avulsion fracture of distal fibula 3/10   and sprain; Left  . Breast cancer ([place])    history of  . Coronary artery disease    Cardiac  catheterization in April of 2014 at [facility]: 50% mid RCA stenosis and mild proximal disease in the left circumflex. Ejection fraction was 50%.  . History of hepatitis B    ?--tested + by [employer]  . HLD (hyperlipidemia)   . HTN (hypertension)   . Osteoarthritis   . Osteopenia     Past Surgical History:  Procedure Laterality Date  . ABDOMINAL HYSTERECTOMY  1985   endometriosis; anatomy  . BREAST SURGERY  10/2002  . CARDIAC CATHETERIZATION  12/19/12   [facility]; EF 50%  . COLONOSCOPY  8/09   Hyperplastic polyp; repeat in 10 years  . HIP FRACTURE SURGERY  06/14/2011   ball replaced (Dr. Earvin Hansen)  . MASTECTOMY  2004  . TONSILLECTOMY  1958    Social History   Socioeconomic History  . Marital status: Married    Spouse name: (Widow) to [place]  . Number of children: Not on file  . Years of education: Not on file  . Highest education level: Not on file  Occupational History  . Not on file  Social Needs  . Financial resource strain: Not on file  . Food insecurity:    Worry: Not on file    Inability: Not on file  . Transportation needs:    Medical: Not on file    Non-medical: Not on file  Tobacco Use  . Smoking status: Former Smoker    Packs/day: 0.50    Years: 10.00    Pack years: 5.00  Types: Cigarettes  . Smokeless tobacco: Former Systems developer    Quit date: 01/24/1986  Substance and Sexual Activity  . Alcohol use: No  . Drug use: No  . Sexual activity: Not on file  Lifestyle  . Physical activity:    Days per week: Not on file    Minutes per session: Not on file  . Stress: Not on file  Relationships  . Social connections:    Talks on phone: Not on file    Gets together: Not on file    Attends religious service: Not on file    Active member of club or organization: Not on file    Attends meetings of clubs or organizations: Not on file    Relationship status: Not on file  . Intimate partner violence:    Fear of current or ex partner: Not on file    Emotionally abused: Not on  file    Physically abused: Not on file    Forced sexual activity: Not on file  Other Topics Concern  . Not on file  Social History Narrative   Widowed to husband Renato Gails      No regular exercise    Family History  Problem Relation Age of Onset  . Heart attack Father 65  . Heart disease Father   . Sarcoidosis Mother   . Ovarian cancer Unknown        Aunt  . Diabetes Unknown        GP  . Breast cancer Sister 69  . Cancer Sister        breast  . Cancer Paternal Aunt        colon/ovarian    Allergies  Allergen Reactions  . Ace Inhibitors Cough  . Codeine Other (See Comments)    Reation:  Hallucinations   . Morphine Other (See Comments)    Reaction:  Hallucinations   . Sulfonamide Derivatives Rash    Medication list reviewed and updated in full in [facility].   GEN: No acute illnesses, no fevers, chills. GI: No n/v/d, eating normally Pulm: No SOB Interactive and getting along well at home.  Otherwise, ROS is as per the HPI.  Objective:   BP 130/80   Pulse 87   Temp 97.8 F (36.6 C) (Oral)   Ht 5\' 5"  (1.651 m)   Wt 220 lb 8 oz (100 kg)   BMI 36.69 kg/m   No exam data present  GEN: well developed, well nourished, no acute distress Eyes: conjunctiva and lids normal, PERRLA, EOMI ENT: TM clear, nares clear, oral exam WNL Neck: supple, no lymphadenopathy, no thyromegaly, no JVD Pulm: clear to auscultation and percussion, respiratory effort normal CV: regular rate and rhythm, S1-S2, no murmur, rub or gallop, no bruits Chest: no scars, masses, no lumps BREAST: breast exam - s/p l mastectomy Chaperoned examination. Entirety of breast examined including axilla, and no enlarged lymph nodes. No significant masses are felt. No nipple discharge.Scarring on the left. Overall, reassuring breast exam.  This portion of the physical examination was chaperoned by Hedy Camara, CMA.  GI: soft, non-tender; no hepatosplenomegaly, masses; active bowel sounds  all quadrants GU: GU exam declined Lymph: no cervical, axillary or inguinal adenopathy MSK: gait normal, muscle tone and strength WNL, no joint swelling, effusions, discoloration, crepitus  SKIN: clear, good turgor, color WNL, no rashes, lesions, or ulcerations Neuro: normal mental status, normal strength, sensation, and motion Psych: alert; oriented to person, place and time, normally interactive and  not anxious or depressed in appearance.  Laboratory and Imaging Data: Results for orders placed or performed in visit on 12/19/17  Hepatitis C antibody  Result Value Ref Range   Hepatitis C Ab NON-REACTIVE NON-REACTI   SIGNAL TO CUT-OFF 0.01 <1.00  Hepatitis B core antibody, IgM  Result Value Ref Range   Hep B C IgM NON-REACTIVE NON-REACTI  Hepatitis B surface antibody  Result Value Ref Range   Hep B S Ab REACTIVE (A) NON-REACTI  Hepatitis B surface antigen  Result Value Ref Range   Hepatitis B Surface Ag NON-REACTIVE NON-REACTI  Hemoglobin A1c  Result Value Ref Range   Hgb A1c MFr Bld 5.9 4.6 - 6.5 %    Lab Results  Component Value Date   WBC 8.0 12/06/2017   HGB 15.4 (H) 12/06/2017   HCT 46.0 12/06/2017   PLT 313.0 12/06/2017   GLUCOSE 127 (H) 12/06/2017   CHOL 252 (H) 12/06/2017   TRIG 399.0 (H) 12/06/2017   HDL 38.60 (L) 12/06/2017   LDLDIRECT 60.0 12/06/2017   LDLCALC 76 01/19/2014   ALT 61 (H) 12/06/2017   AST 45 (H) 12/06/2017   NA 140 12/06/2017   K 5.0 12/06/2017   CL 101 12/06/2017   CREATININE 0.70 12/06/2017   BUN 10 12/06/2017   CO2 31 12/06/2017   TSH 3.53 12/06/2017   INR 1.0 12/16/2012   HGBA1C 5.9 12/19/2017     Assessment and Plan:   Essential hypertension  Screen for colon cancer - Plan: Ambulatory referral to Gastroenterology  Elevated LFTs - Plan: Hepatitis C antibody, Hepatitis B core antibody, IgM, Hepatitis B surface antibody, Hepatitis B surface antigen  Elevated blood sugar - Plan: Hemoglobin A1c  Ductal carcinoma in situ (DCIS) of  left breast - Plan: MM 3D SCREEN BREAST UNI RIGHT  History of left total mastectomy - Plan: MM 3D SCREEN BREAST UNI RIGHT  Screening for breast cancer - Plan: MM 3D SCREEN BREAST UNI RIGHT  Hyperlipidemia LDL goal <70  We will check regarding hepatitis.  Check a1c Colon referral.   Keep working on weight  Follow-up: 6 mo  Orders Placed This Encounter  Procedures  . MM 3D SCREEN BREAST UNI RIGHT  . Hepatitis C antibody  . Hepatitis B core antibody, IgM  . Hepatitis B surface antibody  . Hepatitis B surface antigen  . Hemoglobin A1c  . Ambulatory referral to Gastroenterology    Signed,  Frederico Hamman T. Chrissi Crow, MD   Allergies as of 12/19/2017      Reactions   Ace Inhibitors Cough   Codeine Other (See Comments)   Reation:  Hallucinations    Morphine Other (See Comments)   Reaction:  Hallucinations    Sulfonamide Derivatives Rash      Medication List        Accurate as of 12/19/17 11:59 PM. Always use your most recent med list.          aspirin EC 81 MG tablet Take 81 mg by mouth daily.   CALCIUM 600+D 600-400 MG-UNIT tablet Generic drug:  Calcium Carbonate-Vitamin D Take 1 tablet by mouth 2 (two) times daily.   FLUoxetine 20 MG tablet Commonly known as:  PROZAC Take 1 tablet by mouth  daily   fluticasone 50 MCG/ACT nasal spray Commonly known as:  FLONASE Place 1-2 sprays into both nostrils daily as needed for rhinitis.   GRAPE SEED EXTRACT PO Take 1 capsule by mouth 2 (two) times daily.   Krill Oil 1000 MG Caps Take 1,000  mg by mouth at bedtime.   losartan 50 MG tablet Commonly known as:  COZAAR Take 1 tablet (50 mg total) by mouth daily.   multivitamin with minerals Tabs tablet Take 1 tablet by mouth daily.   OSTEO BI-FLEX ADV JOINT SHIELD Tabs Take 1 tablet by mouth 2 (two) times daily.   PRESERVISION AREDS 2+MULTI VIT Caps Take 1 capsule by mouth daily.

## 2017-12-21 LAB — HEPATITIS C ANTIBODY
Hepatitis C Ab: NONREACTIVE
SIGNAL TO CUT-OFF: 0.01 (ref <1.00)

## 2017-12-21 LAB — HEPATITIS B SURFACE ANTIBODY,QUALITATIVE: Hep B S Ab: REACTIVE — AB

## 2017-12-21 LAB — HEPATITIS B CORE ANTIBODY, IGM: Hep B C IgM: NONREACTIVE

## 2017-12-21 LAB — HEPATITIS B SURFACE ANTIGEN: Hepatitis B Surface Ag: NONREACTIVE

## 2017-12-24 ENCOUNTER — Encounter: Payer: Self-pay | Admitting: Family Medicine

## 2018-01-04 ENCOUNTER — Telehealth: Payer: Self-pay | Admitting: Family Medicine

## 2018-01-04 MED ORDER — LOSARTAN POTASSIUM 50 MG PO TABS: 50.0000 mg | ORAL_TABLET | Freq: Every day | ORAL | 1 refills | Status: DC

## 2018-01-04 MED ORDER — FLUOXETINE HCL 20 MG PO TABS: ORAL_TABLET | ORAL | 1 refills | Status: DC

## 2018-01-04 NOTE — Telephone Encounter (Signed)
Pt requesting refill on Fluoxetine and Losartan to be sent to [facility].

## 2018-01-04 NOTE — Telephone Encounter (Signed)
Refills sent to [facility] in [place]. 

## 2018-01-10 ENCOUNTER — Ambulatory Visit (INDEPENDENT_AMBULATORY_CARE_PROVIDER_SITE_OTHER): Payer: Medicare Other | Admitting: Family Medicine

## 2018-01-10 ENCOUNTER — Encounter: Payer: Self-pay | Admitting: Family Medicine

## 2018-01-10 VITALS — BP 148/90 | HR 102 | Temp 98.2°F | Ht 65.0 in | Wt 219.5 lb

## 2018-01-10 DIAGNOSIS — H60502 Unspecified acute noninfective otitis externa, left ear: Secondary | ICD-10-CM

## 2018-01-10 DIAGNOSIS — I10 Essential (primary) hypertension: Secondary | ICD-10-CM | POA: Diagnosis not present

## 2018-01-10 DIAGNOSIS — H6090 Unspecified otitis externa, unspecified ear: Secondary | ICD-10-CM | POA: Insufficient documentation

## 2018-01-10 MED ORDER — NEOMYCIN-POLYMYXIN-HC 3.5-10000-1 OT SOLN: 3.0000 [drp] | Freq: Four times a day (QID) | OTIC | 0 refills | Status: DC

## 2018-01-10 MED ORDER — FLUTICASONE PROPIONATE 50 MCG/ACT NA SUSP: 1.0000 | Freq: Every day | NASAL | 3 refills | Status: DC | PRN

## 2018-01-10 NOTE — Progress Notes (Signed)
Subjective:    Patient ID: [name] [name], female    DOB: [birth year], 67 y.o.   MRN: [id number]  HPI Here for L ear pain for a week  Off and on  It is sore to touch some times  Eases to straighten canal Feels blocked but no wax in it   Some nasal congestion  Has flonase on med list-not using regularly   No fever Temp: 98.2 F (36.8 C)   Clear mucous from nose  No cough  A little scratchy throat  Other ear feels fine  Tylenol eases it a bit    Pulse Readings from Last 3 Encounters:  01/10/18 (!) 102  12/19/17 87  12/06/17 (!) 54   BP Readings from Last 3 Encounters:  01/10/18 (!) 148/90  12/19/17 130/80  12/06/17 (!) 150/100    Per pt - elevated bp and pulse are normal for her  Patient Active Problem List   Diagnosis Date Noted  . Otitis externa 01/10/2018  . Chronic diastolic heart failure ([place]) 06/18/2013  . Coronary artery disease   . OTHER MALAISE AND FATIGUE 12/28/2008  . ROTATOR CUFF SYNDROME, LEFT 12/10/2008  . Hyperlipidemia LDL goal <70 09/28/2008  . Essential hypertension 09/28/2008  . ALLERGIC RHINITIS 09/28/2008  . OSTEOARTHRITIS 09/28/2008  . DCIS (ductal carcinoma in situ), LEFT, remission 09/28/2008   Past Medical History:  Diagnosis Date  . Allergic rhinitis   . Avulsion fracture of distal fibula 3/10   and sprain; Left  . Breast cancer ([place])    history of  . Coronary artery disease    Cardiac catheterization in April of 2014 at [facility]: 50% mid RCA stenosis and mild proximal disease in the left circumflex. Ejection fraction was 50%.  . History of hepatitis B    ?--tested + by [employer]  . HLD (hyperlipidemia)   . HTN (hypertension)   . Osteoarthritis   . Osteopenia    Past Surgical History:  Procedure Laterality Date  . ABDOMINAL HYSTERECTOMY  1985   endometriosis; anatomy  . BREAST SURGERY  10/2002  . CARDIAC CATHETERIZATION  12/19/12   [facility]; EF 50%  . COLONOSCOPY  8/09   Hyperplastic polyp; repeat in 10 years  . HIP FRACTURE  SURGERY  06/14/2011   ball replaced (Dr. Earvin Hansen)  . MASTECTOMY  2004  . TONSILLECTOMY  1958   Social History   Tobacco Use  . Smoking status: Former Smoker    Packs/day: 0.50    Years: 10.00    Pack years: 5.00    Types: Cigarettes  . Smokeless tobacco: Former Systems developer    Quit date: 01/24/1986  Substance Use Topics  . Alcohol use: No  . Drug use: No   Family History  Problem Relation Age of Onset  . Heart attack Father 67  . Heart disease Father   . Sarcoidosis Mother   . Ovarian cancer Unknown        Aunt  . Diabetes Unknown        GP  . Breast cancer Sister 12  . Cancer Sister        breast  . Cancer Paternal Aunt        colon/ovarian   Allergies  Allergen Reactions  . Ace Inhibitors Cough  . Codeine Other (See Comments)    Reation:  Hallucinations   . Morphine Other (See Comments)    Reaction:  Hallucinations   . Sulfonamide Derivatives Rash   Current Outpatient Medications on File Prior to Visit  Medication Sig Dispense Refill  . aspirin EC 81 MG tablet Take 81 mg by mouth daily.    . Calcium Carbonate-Vitamin D (CALCIUM 600+D) 600-400 MG-UNIT tablet Take 1 tablet by mouth 2 (two) times daily.    [name] Kitchen FLUoxetine (PROZAC) 20 MG tablet Take 1 tablet by mouth  daily 90 tablet 1  . GRAPE SEED EXTRACT PO Take 1 capsule by mouth 2 (two) times daily.     Javier Docker Oil 1000 MG CAPS Take 1,000 mg by mouth at bedtime.    [name] Kitchen losartan (COZAAR) 50 MG tablet Take 1 tablet (50 mg total) by mouth daily. 90 tablet 1  . Misc Natural Products (OSTEO BI-FLEX ADV JOINT SHIELD) TABS Take 1 tablet by mouth 2 (two) times daily.    . Multiple Vitamin (MULTIVITAMIN WITH MINERALS) TABS tablet Take 1 tablet by mouth daily.    . Multiple Vitamins-Minerals (PRESERVISION AREDS 2+MULTI VIT) CAPS Take 1 capsule by mouth daily.     No current facility-administered medications on file prior to visit.     Review of Systems  Constitutional: Negative for activity change, appetite change, fatigue, fever  and unexpected weight change.  HENT: Positive for congestion, ear pain and postnasal drip. Negative for ear discharge, facial swelling, hearing loss, rhinorrhea, sinus pressure and sore throat.   Eyes: Negative for pain, redness and visual disturbance.  Respiratory: Negative for cough, shortness of breath and wheezing.   Cardiovascular: Negative for chest pain and palpitations.  Gastrointestinal: Negative for abdominal pain, blood in stool, constipation and diarrhea.  Endocrine: Negative for polydipsia and polyuria.  Genitourinary: Negative for dysuria, frequency and urgency.  Musculoskeletal: Negative for arthralgias, back pain and myalgias.  Skin: Negative for pallor and rash.  Allergic/Immunologic: Negative for environmental allergies.  Neurological: Negative for dizziness, syncope and headaches.  Hematological: Negative for adenopathy. Does not bruise/bleed easily.  Psychiatric/Behavioral: Negative for decreased concentration and dysphoric mood. The patient is not nervous/anxious.        Objective:   Physical Exam  Constitutional: She appears well-developed and well-nourished. No distress.  obese and well appearing   HENT:  Head: Normocephalic and atraumatic.  Mouth/Throat: Oropharynx is clear and moist.  Nares are boggy No sinus tenderness No temporal tenderness  L ear-canal is swollen and mildly injected  No d/c  No wound or rash  TMs are dull bilaterally  Eyes: Pupils are equal, round, and reactive to light. Conjunctivae and EOM are normal. Right eye exhibits no discharge. Left eye exhibits no discharge.  Neck: Normal range of motion.  Cardiovascular: Normal rate and regular rhythm.  Pulmonary/Chest: Effort normal and breath sounds normal. She has no wheezes.  Lymphadenopathy:    She has no cervical adenopathy.  Skin: Skin is warm and dry. She is not diaphoretic.  Psychiatric: She has a normal mood and affect.          Assessment & Plan:   Problem List Items  Addressed This Visit      Cardiovascular and Mediastinum   Essential hypertension    bp is up today-poss due to pain  Adv to f/u with pcp         Nervous and Auditory   Otitis externa - Primary    Some swelling and pain of ear canal May also have ETd  tx with cortisporin ear drops  flonase ns for sinus congestion  Tylenol prn pain   Disc symptomatic care - see instructions on AVS  Update if not starting to improve in a week  or if worsening

## 2018-01-10 NOTE — Telephone Encounter (Signed)
Left message for [name] [name] to call her insurance to find out what antidepressant they will cover or we can send a prescription to [facility] and she can pay out of pocket $4 for 30 day supply or $10 for 90 day supply.  I ask that she call us back with what she would like to do.  OK for [facility] Triage Nurse to get this information from patient when she calls back.

## 2018-01-10 NOTE — Patient Instructions (Addendum)
You appear to have ear canal swelling/ inflammation  Also you may have some inner ear congestion   Use the cortisporin drops in left ear as directed  Use flonase nasal spray daily for at least 2 weeks  Tylenol for pain  Warm compress on ear may help also   Update if not starting to improve in a week or if worsening    Follow up with Dr Lorelei Pont for blood pressure when you are feeling better-it is elevated

## 2018-01-10 NOTE — Telephone Encounter (Signed)
Pt states when she went to pick up RX, pharmacy said they sent back the refill request for Fluoxetine due to insurance not covering.

## 2018-01-10 NOTE — Assessment & Plan Note (Signed)
bp is up today-poss due to pain  Adv to f/u with pcp

## 2018-01-10 NOTE — Assessment & Plan Note (Signed)
Some swelling and pain of ear canal May also have ETd  tx with cortisporin ear drops  flonase ns for sinus congestion  Tylenol prn pain   Disc symptomatic care - see instructions on AVS  Update if not starting to improve in a week or if worsening

## 2018-01-15 ENCOUNTER — Ambulatory Visit
Admission: RE | Admit: 2018-01-15 | Discharge: 2018-01-15 | Disposition: A | Discharge status: Home / Self Care | Payer: Medicare Other | Source: Ambulatory Visit | Attending: Family Medicine | Admitting: Family Medicine

## 2018-01-15 DIAGNOSIS — Z1231 Encounter for screening mammogram for malignant neoplasm of breast: Secondary | ICD-10-CM | POA: Diagnosis not present

## 2018-01-15 DIAGNOSIS — Z1239 Encounter for other screening for malignant neoplasm of breast: Secondary | ICD-10-CM

## 2018-01-15 DIAGNOSIS — D0512 Intraductal carcinoma in situ of left breast: Secondary | ICD-10-CM

## 2018-01-15 DIAGNOSIS — Z9012 Acquired absence of left breast and nipple: Secondary | ICD-10-CM

## 2018-01-31 DIAGNOSIS — Z1211 Encounter for screening for malignant neoplasm of colon: Secondary | ICD-10-CM | POA: Diagnosis not present

## 2018-01-31 DIAGNOSIS — D126 Benign neoplasm of colon, unspecified: Secondary | ICD-10-CM | POA: Diagnosis not present

## 2018-01-31 DIAGNOSIS — K649 Unspecified hemorrhoids: Secondary | ICD-10-CM | POA: Diagnosis not present

## 2018-02-06 DIAGNOSIS — Z1211 Encounter for screening for malignant neoplasm of colon: Secondary | ICD-10-CM | POA: Diagnosis not present

## 2018-02-06 DIAGNOSIS — D126 Benign neoplasm of colon, unspecified: Secondary | ICD-10-CM | POA: Diagnosis not present

## 2018-03-15 ENCOUNTER — Telehealth: Payer: Self-pay

## 2018-03-15 ENCOUNTER — Encounter: Payer: Self-pay | Admitting: Family Medicine

## 2018-03-15 ENCOUNTER — Ambulatory Visit (INDEPENDENT_AMBULATORY_CARE_PROVIDER_SITE_OTHER): Payer: Medicare Other | Admitting: Family Medicine

## 2018-03-15 VITALS — BP 168/90 | HR 88 | Temp 98.5°F | Ht 65.0 in | Wt 221.0 lb

## 2018-03-15 DIAGNOSIS — R42 Dizziness and giddiness: Secondary | ICD-10-CM

## 2018-03-15 MED ORDER — MECLIZINE HCL 25 MG PO TABS: 12.5000 mg | ORAL_TABLET | Freq: Three times a day (TID) | ORAL | 0 refills | Status: DC | PRN

## 2018-03-15 NOTE — Telephone Encounter (Signed)
Thanks

## 2018-03-15 NOTE — Progress Notes (Signed)
She had trouble getting her prozac rx filled.  Per patient she had phone troubles and didn't get prev message on 01/04/18 about med options.  See avs.  dw pt. She has been off SSRI for months.    She has room spinning but not lightheaded.  Can be some worse with position change but not always.  Noted intermittently for about 1 month, but more noticeable recently.  No fevers.  No vomiting.  No chills. No cough.  No ear pain.  No facial pain.  Always spins in the same direction.  Not worse with rolling over in the bed.  No hearing changes.  Sx not worse with eye tracking.   L leg swelling at baseline in compression stocking.    Meds, vitals, and allergies reviewed.   ROS: Per HPI unless specifically indicated in ROS section   GEN: nad, alert and oriented HEENT: mucous membranes moist NECK: supple w/o LA CV: rrr.  PULM: ctab, no inc wob ABD: soft, +bs EXT: no edema SKIN: no acute rash CN 2-12 wnl B, S/S/DTR wnl x4 Dix-Hallpike testing is positive.  She did not have symptoms laying down looking to the right but she had symptoms when she moved to a sitting position and look straight ahead.  She again had symptoms when she would lay down backwards even without head turning.  She again had return of symptoms with sitting up.  She was not orthostatic and had no symptoms on standing otherwise.  Even when she had symptoms she did not have nystagmus.  I did not test her looking to the left since she already had positive testing.  Discussed with patient.  She agrees.

## 2018-03-15 NOTE — Telephone Encounter (Addendum)
Pt walked in; pt has constant dizziness/lightheadedness for 2 weeks with no change in pts positioning(sit, stand or lay) pts BP elevated today vitals T 98.2  P 86  BP 160/110 (lge cuff lt arm sitting) and 96% pulse ox at rm air. Pt has been taking losartan 50 mg daily without missing a pill. No H/A, SOB or CP. Lt lower leg swollen with compression stocking on; rt leg not swollen. Pt had lymph nodes removed from lt leg 3 years ago and swelling goes down overnight. Pt said the swelling in lt leg same since surgery 2016. Pt has other complaints that she will schedule f/u appt with PCP about bumps behind lt ear for months with no change and not hurting and wants to ck lt foot periodically where 2 toes were removed. FYI to Dr Damita Dunnings.pt scheduled to see Dr Damita Dunnings today at 2:45 about vertigo and advised pt since she is driving could wait in waiting room to relax, watch TV and then see Dr Damita Dunnings; pt said she was going to ride up the street and would be back before 2:45.

## 2018-03-15 NOTE — Patient Instructions (Addendum)
Call your insurance to find out what antidepressant they will cover or we can send a prescription to [facility] and she can pay out of pocket $4 for 30 day supply or $10 for 90 day supply.    Try meclizine in the meantime and update Dr. Lorelei Pont next week.   Take care.  Glad to see you.

## 2018-03-17 DIAGNOSIS — R42 Dizziness and giddiness: Secondary | ICD-10-CM | POA: Insufficient documentation

## 2018-03-17 NOTE — Assessment & Plan Note (Signed)
See after visit summary.  She had not recently stopped her SSRI so we cannot attribute her symptoms to that.  She will check on coverage and update her PCP about that.  She does not have obvious hearing loss.  She has reproducible symptoms with position change but she does not have orthostatic symptoms on standing.  Discussed with patient about options.  It would be less typical for her to have ongoing BPV that would last this long.  She could have a form of vestibulitis/labyrinthitis.   reasonable to try meclizine in the meantime.  She is not hypotensive.  I did not alter her blood pressure medications in the meantime because I do not want to make her hypotensive.  She has a normal neurologic exam otherwise without focal deficit so she likely does not need imaging at this point.  It is reasonable to see how she responds clinically and then go from there.  Routed to PCP as FYI. >25 minutes spent in face to face time with patient, >50% spent in counselling or coordination of care.

## 2018-03-18 ENCOUNTER — Encounter: Payer: Self-pay | Admitting: Family Medicine

## 2018-03-18 ENCOUNTER — Ambulatory Visit (INDEPENDENT_AMBULATORY_CARE_PROVIDER_SITE_OTHER): Payer: Medicare Other | Admitting: Family Medicine

## 2018-03-18 VITALS — BP 150/100 | HR 90 | Temp 98.1°F | Ht 65.0 in | Wt 222.0 lb

## 2018-03-18 DIAGNOSIS — R600 Localized edema: Secondary | ICD-10-CM | POA: Diagnosis not present

## 2018-03-18 DIAGNOSIS — B369 Superficial mycosis, unspecified: Secondary | ICD-10-CM | POA: Diagnosis not present

## 2018-03-18 DIAGNOSIS — R42 Dizziness and giddiness: Secondary | ICD-10-CM

## 2018-03-18 DIAGNOSIS — F325 Major depressive disorder, single episode, in full remission: Secondary | ICD-10-CM

## 2018-03-18 DIAGNOSIS — M79671 Pain in right foot: Secondary | ICD-10-CM | POA: Diagnosis not present

## 2018-03-18 DIAGNOSIS — I10 Essential (primary) hypertension: Secondary | ICD-10-CM

## 2018-03-18 DIAGNOSIS — M79672 Pain in left foot: Secondary | ICD-10-CM | POA: Diagnosis not present

## 2018-03-18 MED ORDER — LOSARTAN POTASSIUM 100 MG PO TABS: 100.0000 mg | ORAL_TABLET | Freq: Every day | ORAL | 1 refills | Status: DC

## 2018-03-18 MED ORDER — NYSTATIN 100000 UNIT/GM EX CREA: 1.0000 "application " | TOPICAL_CREAM | Freq: Two times a day (BID) | CUTANEOUS | 2 refills | Status: DC

## 2018-03-18 MED ORDER — FLUOXETINE HCL 20 MG PO TABS
ORAL_TABLET | ORAL | 1 refills | Status: DC
Start: 2018-03-18 — End: 2018-03-19

## 2018-03-18 NOTE — Progress Notes (Signed)
Dr. Frederico Hamman T. Donette Mainwaring, MD, [facility] [place], [address] Phone: [phone] Fax: [phone]  03/18/2018  Patient: [name] [name], MRN: [id number], DOB: 1950-11-22, 67 y.o.  Primary Physician:  Owens Loffler, MD   Chief Complaint  Patient presents with  . Leg Swelling  . Bumps Behind Left Ear  . Medication Refill    Prozac  . Dizziness    Seen Dr. Damita Dunnings on Friday  . Hypertension   Subjective:   [name] [name] is a 67 y.o. very pleasant female patient who presents with the following:  Several weeks of vertigo and this is inducible. Saw Dr. D who gave her some meclizine and has improved somewhat  Bumps behind L ear. Have been there for 4-5 months. Can scrub and they never itch.  Nystatin for behind her ears.   HTN: Tolerating all medications without side effects Not at goal No CP, no sob. No HA.  BP Readings from Last 3 Encounters:  03/18/18 (!) 150/100  03/15/18 (!) 168/90  01/10/18 (!) 102/58    Basic Metabolic Panel:    Component Value Date/Time   NA 140 12/06/2017 0840   NA 142 12/16/2012 1100   K 5.0 12/06/2017 0840   CL 101 12/06/2017 0840   CO2 31 12/06/2017 0840   BUN 10 12/06/2017 0840   BUN 13 12/16/2012 1100   CREATININE 0.70 12/06/2017 0840   GLUCOSE 127 (H) 12/06/2017 0840   CALCIUM 9.9 12/06/2017 0840    She has run out of prozac but still feeling ok  Past Medical History, Surgical History, Social History, Family History, Problem List, Medications, and Allergies have been reviewed and updated if relevant.  Patient Active Problem List   Diagnosis Date Noted  . Vertigo 03/17/2018  . Chronic diastolic heart failure ([place]) 06/18/2013  . Coronary artery disease   . OTHER MALAISE AND FATIGUE 12/28/2008  . ROTATOR CUFF SYNDROME, LEFT 12/10/2008  . Hyperlipidemia LDL goal <70 09/28/2008  . Essential hypertension 09/28/2008  . ALLERGIC RHINITIS 09/28/2008  . OSTEOARTHRITIS  09/28/2008  . DCIS (ductal carcinoma in situ), LEFT, remission 09/28/2008    Past Medical History:  Diagnosis Date  . Allergic rhinitis   . Avulsion fracture of distal fibula 3/10   and sprain; Left  . Breast cancer [facility]) 2004   Left Breast Cancer  . Coronary artery disease    Cardiac catheterization in April of 2014 at [facility]: 50% mid RCA stenosis and mild proximal disease in the left circumflex. Ejection fraction was 50%.  . History of hepatitis B    ?--tested + by [facility]  . HLD (hyperlipidemia)   . HTN (hypertension)   . Osteoarthritis   . Osteopenia     Past Surgical History:  Procedure Laterality Date  . ABDOMINAL HYSTERECTOMY  1985   endometriosis; anatomy  . BREAST SURGERY  10/2002  . CARDIAC CATHETERIZATION  12/19/12   [facility]; EF 50%  . COLONOSCOPY  8/09   Hyperplastic polyp; repeat in 10 years  . HIP FRACTURE SURGERY  06/14/2011   ball replaced (Dr. Earvin Hansen)  . MASTECTOMY Left 2004  . TONSILLECTOMY  1958    Social History   Socioeconomic History  . Marital status: Married    Spouse name: (Widow) to [place]  . Number of children: Not on file  . Years of education: Not on file  . Highest education level: Not on file  Occupational History  . Not on file  Social Needs  . Financial resource strain: Not on file  . Food insecurity:    Worry: Not on file    Inability: Not on file  . Transportation needs:    Medical: Not on file    Non-medical: Not on file  Tobacco Use  . Smoking status: Former Smoker    Packs/day: 0.50    Years: 10.00    Pack years: 5.00    Types: Cigarettes  . Smokeless tobacco: Former Systems developer    Quit date: 01/24/1986  Substance and Sexual Activity  . Alcohol use: No  . Drug use: No  . Sexual activity: Not on file  Lifestyle  . Physical activity:    Days per week: Not on file    Minutes per session: Not on file  . Stress: Not on file  Relationships  . Social connections:    Talks on phone: Not on file    Gets together: Not on file     Attends religious service: Not on file    Active member of club or organization: Not on file    Attends meetings of clubs or organizations: Not on file    Relationship status: Not on file  . Intimate partner violence:    Fear of current or ex partner: Not on file    Emotionally abused: Not on file    Physically abused: Not on file    Forced sexual activity: Not on file  Other Topics Concern  . Not on file  Social History Narrative   Widowed to husband [name] [name]      No regular exercise    Family History  Problem Relation Age of Onset  . Heart attack Father 26  . Heart disease Father   . Sarcoidosis Mother   . Ovarian cancer Unknown        Aunt  . Diabetes Unknown        GP  . Breast cancer Sister 82  . Cancer Sister        breast  . Cancer Paternal Aunt        colon/ovarian    Allergies  Allergen Reactions  . Ace Inhibitors Cough  . Codeine Other (See Comments)    Reation:  Hallucinations   . Morphine Other (See Comments)    Reaction:  Hallucinations   . Sulfonamide Derivatives Rash    Medication list reviewed and updated in full in [place].   GEN: No acute illnesses, no fevers, chills. GI: No n/v/d, eating normally Pulm: No SOB Interactive and getting along well at home.  Otherwise, ROS is as per the HPI.  Objective:   BP (!) 150/100   Pulse 90   Temp 98.1 F (36.7 C) (Oral)   Ht 5\' 5"  (1.651 m)   Wt 222 lb (100.7 kg)   BMI 36.94 kg/m   GEN: WDWN, NAD, Non-toxic, A & O x 3 HEENT: Atraumatic, Normocephalic. Neck supple. No masses, No LAD. Slight white coloration behind ears Ears and Nose: No external deformity. CV: RRR, No M/G/R. No JVD. No thrill. No extra heart sounds. PULM: CTA B, no wheezes, crackles, rhonchi. No retractions. No resp. distress. No accessory muscle use. EXTR: No c/c/tr LE edema NEURO Normal gait.  PSYCH: Normally interactive. Conversant. Not depressed or anxious appearing.  Calm demeanor.   Laboratory and  Imaging Data:  Assessment and Plan:   Essential hypertension  Foot pain, bilateral  Fungal rash of torso  Depression, major, in remission ([place])  Bilateral lower extremity edema  Vertigo  I am good to increase the patient's losartan to 100 mg a day.  She is going to let me know how this goes after 2 or 3 weeks with some blood pressure readings.  She had some concern about some coolness to her feet, but the pulses are quite good in both feet.  At this point I think this is relatively benign, and suggested wearing socks.  If it persists or worsens at all we could always get ABIs, but she appears to have good pulses in both feet.  Refilled her Prozac.  She does have some ongoing edema, this is long-standing and she does have diastolic heart failure.  She is wearing some compression stockings, and I recommended propping her feet up some and trying to remain active as she could.  If vertigo persists greater than 2 additional weeks, asked her to call me, and we could consider either vestibular rehab or ENT evaluation.  Follow-up: No follow-ups on file.  Meds ordered this encounter  Medications  . FLUoxetine (PROZAC) 20 MG tablet    Sig: Take 1 tablet by mouth  daily    Dispense:  90 tablet    Refill:  1  . nystatin cream (MYCOSTATIN)    Sig: Apply 1 application topically 2 (two) times daily.    Dispense:  30 g    Refill:  2  . losartan (COZAAR) 100 MG tablet    Sig: Take 1 tablet (100 mg total) by mouth daily.    Dispense:  90 tablet    Refill:  1    Signed,  Cristel Rail T. Josaiah Muhammed, MD   Allergies as of 03/18/2018      Reactions   Ace Inhibitors Cough   Codeine Other (See Comments)   Reation:  Hallucinations    Morphine Other (See Comments)   Reaction:  Hallucinations    Sulfonamide Derivatives Rash      Medication List        Accurate as of 03/18/18 11:59 PM. Always use your most recent med list.          aspirin EC 81 MG tablet Take 81 mg by mouth daily.     CALCIUM 600+D 600-400 MG-UNIT tablet Generic drug:  Calcium Carbonate-Vitamin D Take 1 tablet by mouth 2 (two) times daily.   FLUoxetine 20 MG tablet Commonly known as:  PROZAC Take 1 tablet by mouth  daily   fluticasone 50 MCG/ACT nasal spray Commonly known as:  FLONASE Place 1-2 sprays into both nostrils daily as needed for rhinitis.   GRAPE SEED EXTRACT PO Take 1 capsule by mouth 2 (two) times daily.   Krill Oil 1000 MG Caps Take 1,000 mg by mouth at bedtime.   losartan 100 MG tablet Commonly known as:  COZAAR Take 1 tablet (100 mg total) by mouth daily.   meclizine 25 MG tablet Commonly known as:  ANTIVERT Take 0.5-1 tablets (12.5-25 mg total) by mouth 3 (three) times daily as needed for dizziness (sedation caution).   multivitamin with minerals Tabs tablet Take 1 tablet by mouth daily.   nystatin cream Commonly known as:  MYCOSTATIN Apply 1 application topically 2 (two) times daily.   OSTEO BI-FLEX ADV JOINT SHIELD Tabs Take 1 tablet by mouth 2 (two) times daily.   PRESERVISION AREDS 2+MULTI VIT Caps Take 1 capsule by mouth daily.

## 2018-03-19 ENCOUNTER — Other Ambulatory Visit: Payer: Self-pay | Admitting: *Deleted

## 2018-03-19 ENCOUNTER — Encounter: Payer: Self-pay | Admitting: Family Medicine

## 2018-03-19 ENCOUNTER — Telehealth: Payer: Self-pay | Admitting: Family Medicine

## 2018-03-19 MED ORDER — FLUOXETINE HCL 20 MG PO CAPS: 20.0000 mg | ORAL_CAPSULE | Freq: Every day | ORAL | 1 refills | Status: DC

## 2018-03-19 NOTE — Telephone Encounter (Signed)
Received fax from [facility] asking for a new prescription for Fluoxetine 20 mg capsules.  Fax states capsules are much cheaper plus tablets are not covered on insurance.  Rx sent as requested.

## 2018-03-19 NOTE — Telephone Encounter (Signed)
Copied from [place] [id number]. Topic: Quick Communication - Rx Refill/Question >> Mar 19, 2018  2:25 PM [name] [name], [name] wrote: Medication: FLUoxetine (PROZAC) 20 MG tablet  Has the patient contacted their pharmacy? Yes (Agent: If no, request that the patient contact the pharmacy for the refill.) (Agent: If yes, when and what did the pharmacy advise?)  Preferred Pharmacy (with phone number or street name): [facility] [address], [place] - [place] [phone] (Phone) [phone] (Fax)      Agent: Please be advised that RX refills may take up to 3 business days. We ask that you follow-up with your pharmacy.

## 2018-03-20 NOTE — Telephone Encounter (Signed)
Rx refilled by office 03/19/18

## 2018-07-30 DIAGNOSIS — Z23 Encounter for immunization: Secondary | ICD-10-CM | POA: Diagnosis not present

## 2018-12-11 ENCOUNTER — Other Ambulatory Visit: Payer: Self-pay | Admitting: Family Medicine

## 2018-12-11 ENCOUNTER — Telehealth: Payer: Self-pay | Admitting: Family Medicine

## 2018-12-11 MED ORDER — FLUOXETINE HCL 20 MG PO CAPS: 20.0000 mg | ORAL_CAPSULE | Freq: Every day | ORAL | 0 refills | Status: DC

## 2018-12-11 MED ORDER — LOSARTAN POTASSIUM 100 MG PO TABS: 100.0000 mg | ORAL_TABLET | Freq: Every day | ORAL | 0 refills | Status: DC

## 2018-12-11 NOTE — Telephone Encounter (Signed)
Called pt to reschedule appt due to her not having video capability and she said she will need refills on Fluoxetine and Lipitor prescriptions. Pt states she will call pharmacy and have them send you a request but wanted you to know that she will need refill soon.

## 2018-12-11 NOTE — Telephone Encounter (Signed)
Left message for [name] [name] that I refilled her fluoxetine but Dr. Lorelei Pont wants her to hold the Lipitor for now until she can come in for her physical and get her liver function labs checked.

## 2018-12-11 NOTE — Telephone Encounter (Signed)
I want to hold the lipitor until she can come in for her physical and we can recheck her liver function

## 2018-12-11 NOTE — Telephone Encounter (Signed)
I refilled the fluoxetine but I do not see Lipitor on her current medication list.  Please Advise.

## 2018-12-11 NOTE — Addendum Note (Signed)
Addended by: Carter Kitten on: 12/11/2018 02:17 PM   Modules accepted: Orders

## 2018-12-11 NOTE — Telephone Encounter (Signed)
Pt called back and is asking for a refill on Losartan- sent to [facility] on [address] in [place]. Pt has 1 week left, she will call pharmacy in the future

## 2018-12-11 NOTE — Telephone Encounter (Signed)
Losartan and Fluoxetine refills sent to [facility] on [address].  I called and cancelled refill on Fluoxetine that I sent to [facility] earlier today.

## 2018-12-18 ENCOUNTER — Ambulatory Visit: Payer: Medicare Other

## 2018-12-23 ENCOUNTER — Encounter: Payer: Medicare Other | Admitting: Family Medicine

## 2019-04-21 ENCOUNTER — Other Ambulatory Visit: Payer: Self-pay | Admitting: Family Medicine

## 2019-04-21 DIAGNOSIS — R739 Hyperglycemia, unspecified: Secondary | ICD-10-CM

## 2019-04-21 DIAGNOSIS — E559 Vitamin D deficiency, unspecified: Secondary | ICD-10-CM

## 2019-04-21 DIAGNOSIS — R5383 Other fatigue: Secondary | ICD-10-CM

## 2019-04-21 DIAGNOSIS — E785 Hyperlipidemia, unspecified: Secondary | ICD-10-CM

## 2019-04-21 DIAGNOSIS — Z79899 Other long term (current) drug therapy: Secondary | ICD-10-CM

## 2019-04-22 ENCOUNTER — Ambulatory Visit: Payer: Medicare Other

## 2019-04-23 ENCOUNTER — Other Ambulatory Visit (INDEPENDENT_AMBULATORY_CARE_PROVIDER_SITE_OTHER): Payer: Medicare Other

## 2019-04-23 ENCOUNTER — Other Ambulatory Visit: Payer: Self-pay

## 2019-04-23 DIAGNOSIS — E785 Hyperlipidemia, unspecified: Secondary | ICD-10-CM

## 2019-04-23 DIAGNOSIS — R739 Hyperglycemia, unspecified: Secondary | ICD-10-CM

## 2019-04-23 DIAGNOSIS — Z79899 Other long term (current) drug therapy: Secondary | ICD-10-CM

## 2019-04-23 DIAGNOSIS — E559 Vitamin D deficiency, unspecified: Secondary | ICD-10-CM | POA: Diagnosis not present

## 2019-04-23 DIAGNOSIS — R5383 Other fatigue: Secondary | ICD-10-CM | POA: Diagnosis not present

## 2019-04-23 LAB — LIPID PANEL
Cholesterol: 330 mg/dL — ABNORMAL HIGH (ref 0–200)
HDL: 41.2 mg/dL (ref >39.00)
Total CHOL/HDL Ratio: 8
Triglycerides: 439 mg/dL — ABNORMAL HIGH (ref 0.0–149.0)

## 2019-04-23 LAB — CBC WITH DIFFERENTIAL/PLATELET
Basophils Absolute: 0.1 10*3/uL (ref 0.0–0.1)
Basophils Relative: 0.7 % (ref 0.0–3.0)
Eosinophils Absolute: 0.3 10*3/uL (ref 0.0–0.7)
Eosinophils Relative: 3.4 % (ref 0.0–5.0)
HCT: 46.6 % — ABNORMAL HIGH (ref 36.0–46.0)
Hemoglobin: 15.3 g/dL — ABNORMAL HIGH (ref 12.0–15.0)
Lymphocytes Relative: 36.7 % (ref 12.0–46.0)
Lymphs Abs: 3.5 10*3/uL (ref 0.7–4.0)
MCHC: 32.8 g/dL (ref 30.0–36.0)
MCV: 89.1 fl (ref 78.0–100.0)
Monocytes Absolute: 0.8 10*3/uL (ref 0.1–1.0)
Monocytes Relative: 8.9 % (ref 3.0–12.0)
Neutro Abs: 4.8 10*3/uL (ref 1.4–7.7)
Neutrophils Relative %: 50.3 % (ref 43.0–77.0)
Platelets: 339 10*3/uL (ref 150.0–400.0)
RBC: 5.24 Mil/uL — ABNORMAL HIGH (ref 3.87–5.11)
RDW: 14.6 % (ref 11.5–15.5)
WBC: 9.4 10*3/uL (ref 4.0–10.5)

## 2019-04-23 LAB — HEPATIC FUNCTION PANEL
ALT: 45 U/L — ABNORMAL HIGH (ref 0–35)
AST: 32 U/L (ref 0–37)
Albumin: 4.6 g/dL (ref 3.5–5.2)
Alkaline Phosphatase: 76 U/L (ref 39–117)
Bilirubin, Direct: 0.1 mg/dL (ref 0.0–0.3)
Total Bilirubin: 0.4 mg/dL (ref 0.2–1.2)
Total Protein: 7.3 g/dL (ref 6.0–8.3)

## 2019-04-23 LAB — BASIC METABOLIC PANEL
BUN: 14 mg/dL (ref 6–23)
CO2: 31 mEq/L (ref 19–32)
Calcium: 10.4 mg/dL (ref 8.4–10.5)
Chloride: 99 mEq/L (ref 96–112)
Creatinine, Ser: 0.69 mg/dL (ref 0.40–1.20)
GFR: 84.65 mL/min (ref >60.00)
Glucose, Bld: 104 mg/dL — ABNORMAL HIGH (ref 70–99)
Potassium: 4.6 mEq/L (ref 3.5–5.1)
Sodium: 140 mEq/L (ref 135–145)

## 2019-04-23 LAB — HEMOGLOBIN A1C: Hgb A1c MFr Bld: 5.7 % (ref 4.6–6.5)

## 2019-04-23 LAB — VITAMIN D 25 HYDROXY (VIT D DEFICIENCY, FRACTURES): VITD: 33.89 ng/mL (ref 30.00–100.00)

## 2019-04-23 LAB — TSH: TSH: 2.34 u[IU]/mL (ref 0.35–4.50)

## 2019-04-23 LAB — LDL CHOLESTEROL, DIRECT: Direct LDL: 76 mg/dL

## 2019-04-28 ENCOUNTER — Encounter: Payer: Medicare Other | Admitting: Family Medicine

## 2019-04-28 ENCOUNTER — Other Ambulatory Visit: Payer: Self-pay

## 2019-04-28 ENCOUNTER — Telehealth: Payer: Self-pay | Admitting: Family Medicine

## 2019-04-28 NOTE — Telephone Encounter (Signed)
[name] [name] notified as instructed by telephone.  She is comfortable waiting until child's test results come back.

## 2019-04-28 NOTE — Telephone Encounter (Signed)
Please call  As long as she feels comfortable, it would be safe to wait until the child's test comes back.  Testing is available to anyone, so if that is her preference then that is ok, too

## 2019-04-28 NOTE — Telephone Encounter (Signed)
Patient came in for appointment.  She was exposed to a child that might have covid and she had diarrhea this morning.   Patient wants to know if she needs to be tested for Covid.

## 2019-08-05 ENCOUNTER — Other Ambulatory Visit: Payer: Self-pay | Admitting: Family Medicine

## 2019-08-05 NOTE — Telephone Encounter (Signed)
Can you try and reschedule patient's CPE with Dr. Lorelei Pont from August.  Then send back to me to refill medication.

## 2019-08-06 ENCOUNTER — Other Ambulatory Visit: Payer: Self-pay

## 2019-08-12 NOTE — Telephone Encounter (Signed)
Left message asking pt to call office  °

## 2019-08-12 NOTE — Telephone Encounter (Signed)
Patient called back She schedule her CPE  for next week. 12/8 @ 10:40

## 2019-08-19 ENCOUNTER — Ambulatory Visit (INDEPENDENT_AMBULATORY_CARE_PROVIDER_SITE_OTHER): Payer: Medicare Other | Admitting: Family Medicine

## 2019-08-19 ENCOUNTER — Encounter: Payer: Self-pay | Admitting: Family Medicine

## 2019-08-19 ENCOUNTER — Other Ambulatory Visit: Payer: Self-pay

## 2019-08-19 VITALS — BP 120/84 | HR 90 | Temp 97.3°F | Ht 64.5 in | Wt 214.5 lb

## 2019-08-19 DIAGNOSIS — Z23 Encounter for immunization: Secondary | ICD-10-CM

## 2019-08-19 DIAGNOSIS — Z0000 Encounter for general adult medical examination without abnormal findings: Secondary | ICD-10-CM

## 2019-08-19 MED ORDER — FENOFIBRATE 145 MG PO TABS: 145.0000 mg | ORAL_TABLET | Freq: Every day | ORAL | 1 refills | Status: DC

## 2019-08-19 NOTE — Progress Notes (Signed)
[name] [name], MD Primary Care and [place] at [facility] [place], [address] Phone: [phone]  FAX: [place] - 68 y.o. female  MRN [id number]  Date of Birth: Dec 26, 1950  Visit Date: 08/19/2019  PCP: Owens Loffler, MD  Referred by: Owens Loffler, MD  Chief Complaint  Patient presents with  . Medicare Wellness    This visit occurred during the SARS-CoV-2 public health emergency.  Safety protocols were in place, including screening questions prior to the visit, additional usage of staff PPE, and extensive cleaning of exam room while observing appropriate contact time as indicated for disinfecting solutions.   Patient Care Team: Owens Loffler, MD as PCP - General Subjective:   [name] [name] is a 68 y.o. pleasant patient who presents for a medicare wellness examination:  Health Maintenance Summary Reviewed and updated, unless pt declines services.  Tobacco History Reviewed. Non-smoker Alcohol: No concerns, no excessive use Exercise Habits: Some activity, rec at least 30 mins 5 times a week STD concerns: none Drug Use: None Birth control method: hyst Menses regular: n/a Lumps or breast concerns: s/p cancer Breast Cancer Family History: no  Doing ok.  Hard and father passed away in 03-12-2023.  Sister and she went her.  Dad passed away in June  Wt Readings from Last 3 Encounters:  08/19/19 214 lb 8 oz (97.3 kg)  03/18/18 222 lb (100.7 kg)  03/15/18 221 lb (100.2 kg)    The ASCVD Risk score Mikey Bussing DC Jr., et al., 2013) failed to calculate for the following reasons:   The valid total cholesterol range is 130 to 320 mg/dL   Health Maintenance  Topic Date Due  . MAMMOGRAM  01/16/2020  . TETANUS/TDAP  08/31/2020  . COLONOSCOPY  02/01/2028  . INFLUENZA VACCINE  Completed  . DEXA SCAN  Completed  . Hepatitis C Screening  Completed  . PNA vac Low Risk Adult  Completed    Immunization History  Administered Date(s) Administered  . Fluad Quad(high Dose 65+) 08/19/2019  . Influenza Split 07/10/2011  . Influenza Whole 08/11/2008, 10/11/2009, 08/31/2010  . Influenza, High Dose Seasonal PF 07/30/2018  . Influenza,inj,Quad PF,6+ Mos 05/21/2013, 06/23/2014, 07/12/2015, 12/06/2017  . Pneumococcal Conjugate-13 12/06/2017  . Pneumococcal Polysaccharide-23 08/19/2019  . Td 08/31/2010    Patient Active Problem List   Diagnosis Date Noted  . Vertigo 03/17/2018  . Chronic diastolic heart failure ([place]) 06/18/2013  . Coronary artery disease   . OTHER MALAISE AND FATIGUE 12/28/2008  . Hyperlipidemia LDL goal <70 09/28/2008  . Essential hypertension 09/28/2008  . ALLERGIC RHINITIS 09/28/2008  . OSTEOARTHRITIS 09/28/2008  . DCIS (ductal carcinoma in situ), LEFT, remission 09/28/2008   Past Medical History:  Diagnosis Date  . Allergic rhinitis   . Avulsion fracture of distal fibula 3/10   and sprain; Left  . Breast cancer [facility]) 2004   Left Breast Cancer  . Coronary artery disease    Cardiac catheterization in April of 2014 at [facility]: 50% mid RCA stenosis and mild proximal disease in the left circumflex. Ejection fraction was 50%.  . History of hepatitis B    ?--tested + by [employer]  . HLD (hyperlipidemia)   . HTN (hypertension)   . Osteoarthritis   . Osteopenia    Past Surgical History:  Procedure Laterality Date  . ABDOMINAL HYSTERECTOMY  1985   endometriosis; anatomy  . BREAST SURGERY  10/2002  . CARDIAC CATHETERIZATION  12/19/12  [facility]; EF 50%  . COLONOSCOPY  8/09   Hyperplastic polyp; repeat in 10 years  . HIP FRACTURE SURGERY  06/14/2011   ball replaced (Dr. Earvin Hansen)  . MASTECTOMY Left 2004  . TONSILLECTOMY  1958   Social History   Socioeconomic History  . Marital status: Married    Spouse name: (Widow) to [place]  . Number of children: Not on file  . Years of education: Not on file  . Highest education level: Not on file  Occupational History   . Not on file  Social Needs  . Financial resource strain: Not on file  . Food insecurity    Worry: Not on file    Inability: Not on file  . Transportation needs    Medical: Not on file    Non-medical: Not on file  Tobacco Use  . Smoking status: Former Smoker    Packs/day: 0.50    Years: 10.00    Pack years: 5.00    Types: Cigarettes  . Smokeless tobacco: Former Systems developer    Quit date: 01/24/1986  Substance and Sexual Activity  . Alcohol use: No  . Drug use: No  . Sexual activity: Not on file  Lifestyle  . Physical activity    Days per week: Not on file    Minutes per session: Not on file  . Stress: Not on file  Relationships  . Social Herbalist on phone: Not on file    Gets together: Not on file    Attends religious service: Not on file    Active member of club or organization: Not on file    Attends meetings of clubs or organizations: Not on file    Relationship status: Not on file  . Intimate partner violence    Fear of current or ex partner: Not on file    Emotionally abused: Not on file    Physically abused: Not on file    Forced sexual activity: Not on file  Other Topics Concern  . Not on file  Social History Narrative   Widowed to husband Renato Gails      No regular exercise   Family History  Problem Relation Age of Onset  . Heart attack Father 46  . Heart disease Father   . Sarcoidosis Mother   . Ovarian cancer Unknown        Aunt  . Diabetes Unknown        GP  . Breast cancer Sister 34  . Cancer Sister        breast  . Cancer Paternal Aunt        colon/ovarian   Allergies  Allergen Reactions  . Ace Inhibitors Cough  . Codeine Other (See Comments)    Reation:  Hallucinations   . Morphine Other (See Comments)    Reaction:  Hallucinations   . Sulfonamide Derivatives Rash    Medication list has been reviewed and updated.  Results for orders placed or performed in visit on 04/23/19  VITAMIN D 25 Hydroxy (Vit-D Deficiency,  Fractures)  Result Value Ref Range   VITD 33.89 30.00 - 100.00 ng/mL  CBC with Differential/Platelet  Result Value Ref Range   WBC 9.4 4.0 - 10.5 K/uL   RBC 5.24 (H) 3.87 - 5.11 Mil/uL   Hemoglobin 15.3 (H) 12.0 - 15.0 g/dL   HCT 46.6 (H) 36.0 - 46.0 %   MCV 89.1 78.0 - 100.0 fl   MCHC 32.8 30.0 - 36.0 g/dL  RDW 14.6 11.5 - 15.5 %   Platelets 339.0 150.0 - 400.0 K/uL   Neutrophils Relative % 50.3 43.0 - 77.0 %   Lymphocytes Relative 36.7 12.0 - 46.0 %   Monocytes Relative 8.9 3.0 - 12.0 %   Eosinophils Relative 3.4 0.0 - 5.0 %   Basophils Relative 0.7 0.0 - 3.0 %   Neutro Abs 4.8 1.4 - 7.7 K/uL   Lymphs Abs 3.5 0.7 - 4.0 K/uL   Monocytes Absolute 0.8 0.1 - 1.0 K/uL   Eosinophils Absolute 0.3 0.0 - 0.7 K/uL   Basophils Absolute 0.1 0.0 - 0.1 K/uL  Hemoglobin A1c  Result Value Ref Range   Hgb A1c MFr Bld 5.7 4.6 - 6.5 %  TSH  Result Value Ref Range   TSH 2.34 0.35 - 4.50 uIU/mL  Basic metabolic panel  Result Value Ref Range   Sodium 140 135 - 145 mEq/L   Potassium 4.6 3.5 - 5.1 mEq/L   Chloride 99 96 - 112 mEq/L   CO2 31 19 - 32 mEq/L   Glucose, Bld 104 (H) 70 - 99 mg/dL   BUN 14 6 - 23 mg/dL   Creatinine, Ser 0.69 0.40 - 1.20 mg/dL   Calcium 10.4 8.4 - 10.5 mg/dL   GFR 84.65 >60.00 mL/min  Hepatic function panel  Result Value Ref Range   Total Bilirubin 0.4 0.2 - 1.2 mg/dL   Bilirubin, Direct 0.1 0.0 - 0.3 mg/dL   Alkaline Phosphatase 76 39 - 117 U/L   AST 32 0 - 37 U/L   ALT 45 (H) 0 - 35 U/L   Total Protein 7.3 6.0 - 8.3 g/dL   Albumin 4.6 3.5 - 5.2 g/dL  Lipid panel  Result Value Ref Range   Cholesterol 330 (H) 0 - 200 mg/dL   Triglycerides (H) 0.0 - 149.0 mg/dL    439.0 Triglyceride is over 400; calculations on Lipids are invalid.   HDL 41.20 >39.00 mg/dL   Total CHOL/HDL Ratio 8   LDL cholesterol, direct  Result Value Ref Range   Direct LDL 76.0 mg/dL    Objective:   BP 120/84   Pulse 90   Temp (!) 97.3 F (36.3 C) (Temporal)   Ht 5' 4.5" (1.638  m)   Wt 214 lb 8 oz (97.3 kg)   SpO2 94%   BMI 36.25 kg/m  Fall Risk  08/19/2019 12/06/2017  Falls in the past year? 1 No  Number falls in past yr: 0 -  Injury with Fall? 0 -   Ideal Body Weight: Weight in (lb) to have BMI = 25: 147.6  Hearing Screening   Method: Audiometry   125Hz  250Hz  500Hz  1000Hz  2000Hz  3000Hz  4000Hz  6000Hz  8000Hz   Right ear:           Left ear:           Comments: Wears Bilateral Hearing Aides   Visual Acuity Screening   Right eye Left eye Both eyes  Without correction:     With correction: 20/25 20/20 20/25    Depression screen [facility] 2/9 08/19/2019 12/06/2017  Decreased Interest 1 1  Down, Depressed, Hopeless 1 1  PHQ - 2 Score 2 2  Altered sleeping 1 1  Tired, decreased energy 1 1  Change in appetite 1 1  Feeling bad or failure about yourself  0 0  Trouble concentrating 0 0  Moving slowly or fidgety/restless 0 0  Suicidal thoughts 0 0  PHQ-9 Score 5 5  Difficult doing work/chores Not difficult  at all Not difficult at all     GEN: well developed, well nourished, no acute distress Eyes: conjunctiva and lids normal, PERRLA, EOMI ENT: TM clear, nares clear, oral exam WNL Neck: supple, no lymphadenopathy, no thyromegaly, no JVD Pulm: clear to auscultation and percussion, respiratory effort normal CV: regular rate and rhythm, S1-S2, no murmur, rub or gallop, no bruits Chest: no scars, masses, no lumps BREAST: breast exam declined GI: soft, non-tender; no hepatosplenomegaly, masses; active bowel sounds all quadrants GU: GU exam declined Lymph: no cervical, axillary or inguinal adenopathy MSK: gait normal, muscle tone and strength WNL, no joint swelling, effusions, discoloration, crepitus  SKIN: clear, good turgor, color WNL, no rashes, lesions, or ulcerations Neuro: normal mental status, normal strength, sensation, and motion Psych: alert; oriented to person, place and time, normally interactive and not anxious or depressed in appeara   All labs  reviewed with patient. Lipids: Lab Results  Component Value Date   CHOL 330 (H) 04/23/2019   Lab Results  Component Value Date   HDL 41.20 04/23/2019   Lab Results  Component Value Date   LDLCALC 76 01/19/2014   Lab Results  Component Value Date   TRIG (H) 04/23/2019    439.0 Triglyceride is over 400; calculations on Lipids are invalid.   Lab Results  Component Value Date   CHOLHDL 8 04/23/2019   CBC: CBC Latest Ref Rng & Units 04/23/2019 12/06/2017 10/05/2016  WBC 4.0 - 10.5 K/uL 9.4 8.0 7.3  Hemoglobin 12.0 - 15.0 g/dL 15.3(H) 15.4(H) 15.9(H)  Hematocrit 36.0 - 46.0 % 46.6(H) 46.0 48.0(H)  Platelets 150.0 - 400.0 K/uL 339.0 313.0 774    Basic Metabolic Panel:    Component Value Date/Time   NA 140 04/23/2019 1047   NA 142 12/16/2012 1100   K 4.6 04/23/2019 1047   CL 99 04/23/2019 1047   CO2 31 04/23/2019 1047   BUN 14 04/23/2019 1047   BUN 13 12/16/2012 1100   CREATININE 0.69 04/23/2019 1047   GLUCOSE 104 (H) 04/23/2019 1047   CALCIUM 10.4 04/23/2019 1047   Hepatic Function Latest Ref Rng & Units 04/23/2019 12/06/2017 07/07/2015  Total Protein 6.0 - 8.3 g/dL 7.3 7.3 7.1  Albumin 3.5 - 5.2 g/dL 4.6 4.3 4.1  AST 0 - 37 U/L 32 45(H) 28  ALT 0 - 35 U/L 45(H) 61(H) 40(H)  Alk Phosphatase 39 - 117 U/L 76 75 69  Total Bilirubin 0.2 - 1.2 mg/dL 0.4 0.5 0.4  Bilirubin, Direct 0.0 - 0.3 mg/dL 0.1 - -    Lab Results  Component Value Date   HGBA1C 5.7 04/23/2019   Lab Results  Component Value Date   TSH 2.34 04/23/2019    No results found.  Assessment and Plan:     ICD-10-CM   1. Healthcare maintenance  Z00.00   2. Need for influenza vaccination  Z23 Flu Vaccine QUAD High Dose(Fluad)  3. Need for prophylactic vaccination against Streptococcus pneumoniae (pneumococcus)  Z23 Pneumococcal polysaccharide vaccine 23-valent greater than or equal to 2yo subcutaneous/IM   Elevated triglycerides are quite profound.  I [place] place the patient on [facility] with follow-up.   Health Maintenance Exam: The patient's preventative maintenance and recommended screening tests for an annual wellness exam were reviewed in full today. Brought up to date unless services declined.  Counselled on the importance of diet, exercise, and its role in overall health and mortality. The patient's FH and SH was reviewed, including their home life, tobacco status, and drug and alcohol status.  Follow-up in 1 year for physical exam or additional follow-up below.  I have personally reviewed the Medicare Annual Wellness questionnaire and have noted 1. The patient's medical and social history 2. Their use of alcohol, tobacco or illicit drugs 3. Their current medications and supplements 4. The patient's functional ability including ADL's, fall risks, home safety risks and hearing or visual             impairment. 5. Diet and physical activities 6. Evidence for depression or mood disorders 7. Reviewed Updated provider list, see scanned forms and [facility] Snapshot.  8. Reviewed whether or not the patient has HCPOA or living will, and discussed what this means with the patient.  Recommended she bring in a copy for his chart in [facility].  The patients weight, height, BMI and visual acuity have been recorded in the chart I have made referrals, counseling and provided education to the patient based review of the above and I have provided the pt with a written personalized care plan for preventive services.  I have provided the patient with a copy of your personalized plan for preventive services. Instructed to take the time to review along with their updated medication list.  Follow-up: No follow-ups on file. Or follow-up in 1 year unless noted.  No future appointments.  No orders of the defined types were placed in this encounter.  There are no discontinued medications. Orders Placed This Encounter  Procedures  . Flu Vaccine QUAD High Dose(Fluad)  . Pneumococcal polysaccharide vaccine  23-valent greater than or equal to 2yo subcutaneous/IM    Signed,  Jahvier Aldea T. Melquiades Kovar, MD   Allergies as of 08/19/2019      Reactions   Ace Inhibitors Cough   Codeine Other (See Comments)   Reation:  Hallucinations    Morphine Other (See Comments)   Reaction:  Hallucinations    Sulfonamide Derivatives Rash      Medication List       Accurate as of August 19, 2019 11:00 AM. If you have any questions, ask your nurse or doctor.        aspirin EC 81 MG tablet Take 81 mg by mouth daily.   Calcium 600+D 600-400 MG-UNIT tablet Generic drug: Calcium Carbonate-Vitamin D Take 1 tablet by mouth 2 (two) times daily.   FLUoxetine 20 MG capsule Commonly known as: PROZAC Take 1 capsule by mouth once daily   fluticasone 50 MCG/ACT nasal spray Commonly known as: FLONASE Place 1-2 sprays into both nostrils daily as needed for rhinitis.   GRAPE SEED EXTRACT PO Take 1 capsule by mouth 2 (two) times daily.   Krill Oil 1000 MG Caps Take 1,000 mg by mouth at bedtime.   losartan 100 MG tablet Commonly known as: COZAAR Take 1 tablet by mouth once daily   meclizine 25 MG tablet Commonly known as: ANTIVERT Take 0.5-1 tablets (12.5-25 mg total) by mouth 3 (three) times daily as needed for dizziness (sedation caution).   multivitamin with minerals Tabs tablet Take 1 tablet by mouth daily.   nystatin cream Commonly known as: MYCOSTATIN Apply 1 application topically 2 (two) times daily.   Osteo Bi-Flex Adv Joint Shield Tabs Take 1 tablet by mouth 2 (two) times daily.   PreserVision AREDS 2+Multi Vit Caps Take 1 capsule by mouth daily.

## 2019-08-22 ENCOUNTER — Other Ambulatory Visit: Payer: Self-pay

## 2019-08-22 MED ORDER — FENOFIBRATE 145 MG PO TABS: 145.0000 mg | ORAL_TABLET | Freq: Every day | ORAL | 1 refills | Status: DC

## 2019-08-29 ENCOUNTER — Encounter: Payer: Self-pay | Admitting: Family Medicine

## 2019-10-31 ENCOUNTER — Telehealth: Payer: Self-pay

## 2019-10-31 MED ORDER — TIZANIDINE HCL 4 MG PO TABS: 4.0000 mg | ORAL_TABLET | Freq: Every evening | ORAL | 1 refills | Status: DC | PRN

## 2019-10-31 NOTE — Telephone Encounter (Signed)
Patient called stating that she pulled a muscle in her Left side on 2/12 or 2/13, she was exercising on [place]. Pain does not radiate anywhere else. She is having hard time getting up and down still and is asking for a muscle relaxer to help and follow up next week if not better. Patient did not want to go out in this weather to come in for office visit today. She has applied Asper cream and taking Tylenol for her symptoms. She has not tried heat or ice.  Pharmacy [facility]

## 2019-11-05 ENCOUNTER — Encounter: Payer: Self-pay | Admitting: Family Medicine

## 2019-11-05 ENCOUNTER — Other Ambulatory Visit: Payer: Self-pay

## 2019-11-05 ENCOUNTER — Ambulatory Visit (INDEPENDENT_AMBULATORY_CARE_PROVIDER_SITE_OTHER): Payer: Medicare Other | Admitting: Family Medicine

## 2019-11-05 DIAGNOSIS — M545 Low back pain, unspecified: Secondary | ICD-10-CM | POA: Insufficient documentation

## 2019-11-05 DIAGNOSIS — M5442 Lumbago with sciatica, left side: Secondary | ICD-10-CM

## 2019-11-05 MED ORDER — MELOXICAM 15 MG PO TABS: 15.0000 mg | ORAL_TABLET | Freq: Every day | ORAL | 1 refills | Status: DC | PRN

## 2019-11-05 NOTE — Patient Instructions (Addendum)
Start meloxicam for pain and inflammation once daily with food  Tizanidine - you can take up to every 8 hours with caution of sedation   Slow walking is good  You can start some back stretches when pain is a little improved- see the handout   Try heat or ice for 10 minutes at a time    We may consider physical therapy if not improving   Update if not starting to improve in a week or if worsening

## 2019-11-05 NOTE — Progress Notes (Signed)
Subjective:    Patient ID: [name] [name], female    DOB: 10-24-1950, 69 y.o.   MRN: [id number]  This visit occurred during the SARS-CoV-2 public health emergency.  Safety protocols were in place, including screening questions prior to the visit, additional usage of staff PPE, and extensive cleaning of exam room while observing appropriate contact time as indicated for disinfecting solutions.    HPI  68 yo pt of Dr Lorelei Pont here for back pain   Wt Readings from Last 3 Encounters:  11/05/19 210 lb 6 oz (95.4 kg)  08/19/19 214 lb 8 oz (97.3 kg)  03/18/18 222 lb (100.7 kg)   35.55 kg/m   Last Tuesday she exercised (gazelle machine)  Wednesday had pain in L low back (around to the low abdomen) Used asper creme  Office called in tizanidine - helps a bit at night   Has never had this before   Got a little better and then worse again today   Pain is a deep ache  No rash  Pain has gone down leg (mild) - more when trying to get up   No n/t or weakness on LE   No urinary symptoms - is drinking water   Pain is worse with movement of any time  Worst to get up from sitting  Twisting hurts  Bending back is worse then bending forward   She has stiffness after inactivity   Took some tylenol otc -does not help much  No nsaids currently   Patient Active Problem List   Diagnosis Date Noted  . Acute left-sided low back pain 11/05/2019  . Vertigo 03/17/2018  . Chronic diastolic heart failure ([place]) 06/18/2013  . Coronary artery disease   . OTHER MALAISE AND FATIGUE 12/28/2008  . Hyperlipidemia LDL goal <70 09/28/2008  . Essential hypertension 09/28/2008  . ALLERGIC RHINITIS 09/28/2008  . OSTEOARTHRITIS 09/28/2008  . DCIS (ductal carcinoma in situ), LEFT, remission 09/28/2008   Past Medical History:  Diagnosis Date  . Allergic rhinitis   . Avulsion fracture of distal fibula 3/10   and sprain; Left  . Breast cancer [facility]) 2004   Left Breast Cancer  . Coronary artery disease     Cardiac catheterization in April of 2014 at [facility]: 50% mid RCA stenosis and mild proximal disease in the left circumflex. Ejection fraction was 50%.  . History of hepatitis B    ?--tested + by [employer]  . HLD (hyperlipidemia)   . HTN (hypertension)   . Osteoarthritis   . Osteopenia    Past Surgical History:  Procedure Laterality Date  . ABDOMINAL HYSTERECTOMY  1985   endometriosis; anatomy  . BREAST SURGERY  10/2002  . CARDIAC CATHETERIZATION  12/19/12   [facility]; EF 50%  . COLONOSCOPY  8/09   Hyperplastic polyp; repeat in 10 years  . HIP FRACTURE SURGERY  06/14/2011   ball replaced (Dr. Earvin Hansen)  . MASTECTOMY Left 2004  . TONSILLECTOMY  1958   Social History   Tobacco Use  . Smoking status: Former Smoker    Packs/day: 0.50    Years: 10.00    Pack years: 5.00    Types: Cigarettes  . Smokeless tobacco: Former Systems developer    Quit date: 01/24/1986  Substance Use Topics  . Alcohol use: No  . Drug use: No   Family History  Problem Relation Age of Onset  . Heart attack Father 63  . Heart disease Father   . Sarcoidosis Mother   . Ovarian cancer Unknown  Aunt  . Diabetes Unknown        GP  . Breast cancer Sister 3  . Cancer Sister        breast  . Cancer Paternal Aunt        colon/ovarian   Allergies  Allergen Reactions  . Ace Inhibitors Cough  . Codeine Other (See Comments)    Reation:  Hallucinations   . Morphine Other (See Comments)    Reaction:  Hallucinations   . Sulfonamide Derivatives Rash   Current Outpatient Medications on File Prior to Visit  Medication Sig Dispense Refill  . aspirin EC 81 MG tablet Take 81 mg by mouth daily.    . Calcium Carbonate-Vitamin D (CALCIUM 600+D) 600-400 MG-UNIT tablet Take 1 tablet by mouth 2 (two) times daily.    . fenofibrate (TRICOR) 145 MG tablet Take 1 tablet (145 mg total) by mouth daily. 90 tablet 1  . FLUoxetine (PROZAC) 20 MG capsule Take 1 capsule by mouth once daily 90 capsule 0  . fluticasone (FLONASE) 50 MCG/ACT  nasal spray Place 1-2 sprays into both nostrils daily as needed for rhinitis. 16 g 3  . GRAPE SEED EXTRACT PO Take 1 capsule by mouth 2 (two) times daily.     Javier Docker Oil 1000 MG CAPS Take 1,000 mg by mouth at bedtime.    [name] Kitchen losartan (COZAAR) 100 MG tablet Take 1 tablet by mouth once daily 90 tablet 0  . meclizine (ANTIVERT) 25 MG tablet Take 0.5-1 tablets (12.5-25 mg total) by mouth 3 (three) times daily as needed for dizziness (sedation caution). 30 tablet 0  . Misc Natural Products (OSTEO BI-FLEX ADV JOINT SHIELD) TABS Take 1 tablet by mouth 2 (two) times daily.    . Multiple Vitamin (MULTIVITAMIN WITH MINERALS) TABS tablet Take 1 tablet by mouth daily.    . Multiple Vitamins-Minerals (PRESERVISION AREDS 2+MULTI VIT) CAPS Take 1 capsule by mouth daily.    [name] Kitchen nystatin cream (MYCOSTATIN) Apply 1 application topically 2 (two) times daily. 30 g 2  . tiZANidine (ZANAFLEX) 4 MG tablet Take 1 tablet (4 mg total) by mouth at bedtime as needed for muscle spasms. 30 tablet 1   No current facility-administered medications on file prior to visit.    Review of Systems  Constitutional: Negative for activity change, appetite change, fatigue, fever and unexpected weight change.  HENT: Negative for congestion, ear pain, rhinorrhea, sinus pressure and sore throat.   Eyes: Negative for pain, redness and visual disturbance.  Respiratory: Negative for cough, shortness of breath and wheezing.   Cardiovascular: Negative for chest pain and palpitations.  Gastrointestinal: Negative for abdominal pain, blood in stool, constipation and diarrhea.  Endocrine: Negative for polydipsia and polyuria.  Genitourinary: Negative for dysuria, frequency and urgency.  Musculoskeletal: Positive for back pain. Negative for arthralgias, gait problem and myalgias.  Skin: Negative for pallor and rash.  Allergic/Immunologic: Negative for environmental allergies.  Neurological: Negative for dizziness, syncope and headaches.    Hematological: Negative for adenopathy. Does not bruise/bleed easily.  Psychiatric/Behavioral: Negative for decreased concentration and dysphoric mood. The patient is not nervous/anxious.        Objective:   Physical Exam Constitutional:      General: She is not in acute distress.    Appearance: Normal appearance. She is well-developed. She is obese. She is not ill-appearing.  HENT:     Head: Normocephalic and atraumatic.     Mouth/Throat:     Mouth: Mucous membranes are moist.  Eyes:  General: No scleral icterus.    Conjunctiva/sclera: Conjunctivae normal.     Pupils: Pupils are equal, round, and reactive to light.  Cardiovascular:     Rate and Rhythm: Normal rate and regular rhythm.  Pulmonary:     Effort: Pulmonary effort is normal.     Breath sounds: Normal breath sounds. No wheezing or rales.  Abdominal:     General: Bowel sounds are normal. There is no distension.     Palpations: Abdomen is soft.     Tenderness: There is no abdominal tenderness.  Musculoskeletal:        General: Tenderness present. No swelling.     Cervical back: Normal range of motion and neck supple.     Lumbar back: Spasms and tenderness present. No swelling, edema or bony tenderness. Decreased range of motion. Positive left straight leg raise test.     Right lower leg: No edema.     Left lower leg: No edema.     Comments: Loss of lordosis with spasm noted (ttp in L lumbar musculature and over upper LS) No skin change or rash  Nl rom of hip (while standing)  Bent knee raise causes some L buttock and upper leg pain  No neuro changes   Lymphadenopathy:     Cervical: No cervical adenopathy.  Skin:    General: Skin is warm and dry.     Coloration: Skin is not pale.     Findings: No erythema or rash.  Neurological:     Mental Status: She is alert.     Cranial Nerves: No cranial nerve deficit.     Sensory: No sensory deficit.     Motor: Motor function is intact. No weakness, atrophy or abnormal  muscle tone.     Coordination: Coordination normal.     Deep Tendon Reflexes: Reflexes are normal and symmetric.     Comments: Gait is slow and guarded due to back pain            Assessment & Plan:   Problem List Items Addressed This Visit      Other   Acute left-sided low back pain    With spasm and loss of lordosis  Advised heat/ice intermittent Slow walking followed by stretching when ready (given handout)  meloxicam 15 mg daily with food  Tizanidine- tid as tolerated with caution of sedation  Update if worse or if neuro symptoms Consider PT if no improvement in several days       Relevant Medications   meloxicam (MOBIC) 15 MG tablet

## 2019-11-05 NOTE — Assessment & Plan Note (Signed)
With spasm and loss of lordosis  Advised heat/ice intermittent Slow walking followed by stretching when ready (given handout)  meloxicam 15 mg daily with food  Tizanidine- tid as tolerated with caution of sedation  Update if worse or if neuro symptoms Consider PT if no improvement in several days

## 2019-11-22 ENCOUNTER — Other Ambulatory Visit: Payer: Self-pay | Admitting: Family Medicine

## 2019-11-24 NOTE — Telephone Encounter (Signed)
Last office visit 11/05/2019 with Dr. Glori Bickers for back pain.  Last refilled 10/31/2019 for #30 with 1 refill.  Next Appt: 02/18/2020 for 6 month follow up.

## 2019-11-28 ENCOUNTER — Inpatient Hospital Stay
Admission: EM | Admit: 2019-11-28 | Discharge: 2019-12-03 | DRG: 286 | Disposition: A | Discharge status: Home Health | Payer: Medicare Other | Attending: Internal Medicine | Admitting: Internal Medicine

## 2019-11-28 ENCOUNTER — Encounter: Payer: Self-pay | Admitting: Emergency Medicine

## 2019-11-28 ENCOUNTER — Other Ambulatory Visit: Payer: Self-pay

## 2019-11-28 ENCOUNTER — Emergency Department: Payer: Medicare Other

## 2019-11-28 DIAGNOSIS — I499 Cardiac arrhythmia, unspecified: Secondary | ICD-10-CM | POA: Diagnosis not present

## 2019-11-28 DIAGNOSIS — F419 Anxiety disorder, unspecified: Secondary | ICD-10-CM | POA: Diagnosis present

## 2019-11-28 DIAGNOSIS — I160 Hypertensive urgency: Secondary | ICD-10-CM | POA: Diagnosis not present

## 2019-11-28 DIAGNOSIS — Z836 Family history of other diseases of the respiratory system: Secondary | ICD-10-CM

## 2019-11-28 DIAGNOSIS — Z888 Allergy status to other drugs, medicaments and biological substances status: Secondary | ICD-10-CM

## 2019-11-28 DIAGNOSIS — M1990 Unspecified osteoarthritis, unspecified site: Secondary | ICD-10-CM | POA: Diagnosis present

## 2019-11-28 DIAGNOSIS — I493 Ventricular premature depolarization: Secondary | ICD-10-CM | POA: Diagnosis present

## 2019-11-28 DIAGNOSIS — Z9012 Acquired absence of left breast and nipple: Secondary | ICD-10-CM

## 2019-11-28 DIAGNOSIS — N390 Urinary tract infection, site not specified: Secondary | ICD-10-CM | POA: Diagnosis present

## 2019-11-28 DIAGNOSIS — Z209 Contact with and (suspected) exposure to unspecified communicable disease: Secondary | ICD-10-CM | POA: Diagnosis not present

## 2019-11-28 DIAGNOSIS — Z90710 Acquired absence of both cervix and uterus: Secondary | ICD-10-CM | POA: Diagnosis not present

## 2019-11-28 DIAGNOSIS — Z6835 Body mass index (BMI) 35.0-35.9, adult: Secondary | ICD-10-CM

## 2019-11-28 DIAGNOSIS — I248 Other forms of acute ischemic heart disease: Secondary | ICD-10-CM | POA: Diagnosis present

## 2019-11-28 DIAGNOSIS — Z9114 Patient's other noncompliance with medication regimen: Secondary | ICD-10-CM

## 2019-11-28 DIAGNOSIS — I5043 Acute on chronic combined systolic (congestive) and diastolic (congestive) heart failure: Secondary | ICD-10-CM | POA: Diagnosis present

## 2019-11-28 DIAGNOSIS — D473 Essential (hemorrhagic) thrombocythemia: Secondary | ICD-10-CM | POA: Diagnosis present

## 2019-11-28 DIAGNOSIS — I5033 Acute on chronic diastolic (congestive) heart failure: Secondary | ICD-10-CM | POA: Diagnosis not present

## 2019-11-28 DIAGNOSIS — I509 Heart failure, unspecified: Secondary | ICD-10-CM

## 2019-11-28 DIAGNOSIS — Z8249 Family history of ischemic heart disease and other diseases of the circulatory system: Secondary | ICD-10-CM

## 2019-11-28 DIAGNOSIS — I5023 Acute on chronic systolic (congestive) heart failure: Secondary | ICD-10-CM | POA: Diagnosis not present

## 2019-11-28 DIAGNOSIS — I2510 Atherosclerotic heart disease of native coronary artery without angina pectoris: Secondary | ICD-10-CM | POA: Diagnosis not present

## 2019-11-28 DIAGNOSIS — I5021 Acute systolic (congestive) heart failure: Secondary | ICD-10-CM | POA: Diagnosis not present

## 2019-11-28 DIAGNOSIS — F32A Depression, unspecified: Secondary | ICD-10-CM | POA: Diagnosis present

## 2019-11-28 DIAGNOSIS — I10 Essential (primary) hypertension: Secondary | ICD-10-CM | POA: Diagnosis not present

## 2019-11-28 DIAGNOSIS — Z885 Allergy status to narcotic agent status: Secondary | ICD-10-CM

## 2019-11-28 DIAGNOSIS — Z8619 Personal history of other infectious and parasitic diseases: Secondary | ICD-10-CM

## 2019-11-28 DIAGNOSIS — Z800 Family history of malignant neoplasm of digestive organs: Secondary | ICD-10-CM

## 2019-11-28 DIAGNOSIS — Z7982 Long term (current) use of aspirin: Secondary | ICD-10-CM

## 2019-11-28 DIAGNOSIS — F329 Major depressive disorder, single episode, unspecified: Secondary | ICD-10-CM | POA: Diagnosis present

## 2019-11-28 DIAGNOSIS — M8580 Other specified disorders of bone density and structure, unspecified site: Secondary | ICD-10-CM | POA: Diagnosis present

## 2019-11-28 DIAGNOSIS — J9600 Acute respiratory failure, unspecified whether with hypoxia or hypercapnia: Secondary | ICD-10-CM | POA: Diagnosis present

## 2019-11-28 DIAGNOSIS — J9621 Acute and chronic respiratory failure with hypoxia: Secondary | ICD-10-CM | POA: Diagnosis present

## 2019-11-28 DIAGNOSIS — I110 Hypertensive heart disease with heart failure: Secondary | ICD-10-CM | POA: Diagnosis not present

## 2019-11-28 DIAGNOSIS — Z79899 Other long term (current) drug therapy: Secondary | ICD-10-CM

## 2019-11-28 DIAGNOSIS — E785 Hyperlipidemia, unspecified: Secondary | ICD-10-CM | POA: Diagnosis present

## 2019-11-28 DIAGNOSIS — J9601 Acute respiratory failure with hypoxia: Secondary | ICD-10-CM | POA: Diagnosis not present

## 2019-11-28 DIAGNOSIS — E669 Obesity, unspecified: Secondary | ICD-10-CM | POA: Diagnosis present

## 2019-11-28 DIAGNOSIS — Z9089 Acquired absence of other organs: Secondary | ICD-10-CM

## 2019-11-28 DIAGNOSIS — I255 Ischemic cardiomyopathy: Secondary | ICD-10-CM | POA: Diagnosis present

## 2019-11-28 DIAGNOSIS — I5031 Acute diastolic (congestive) heart failure: Secondary | ICD-10-CM | POA: Diagnosis not present

## 2019-11-28 DIAGNOSIS — Z833 Family history of diabetes mellitus: Secondary | ICD-10-CM

## 2019-11-28 DIAGNOSIS — R778 Other specified abnormalities of plasma proteins: Secondary | ICD-10-CM | POA: Diagnosis present

## 2019-11-28 DIAGNOSIS — I491 Atrial premature depolarization: Secondary | ICD-10-CM | POA: Diagnosis not present

## 2019-11-28 DIAGNOSIS — Z853 Personal history of malignant neoplasm of breast: Secondary | ICD-10-CM

## 2019-11-28 DIAGNOSIS — Z20822 Contact with and (suspected) exposure to covid-19: Secondary | ICD-10-CM | POA: Diagnosis not present

## 2019-11-28 DIAGNOSIS — Z791 Long term (current) use of non-steroidal anti-inflammatories (NSAID): Secondary | ICD-10-CM

## 2019-11-28 DIAGNOSIS — Z882 Allergy status to sulfonamides status: Secondary | ICD-10-CM

## 2019-11-28 DIAGNOSIS — Z803 Family history of malignant neoplasm of breast: Secondary | ICD-10-CM

## 2019-11-28 DIAGNOSIS — R8281 Pyuria: Secondary | ICD-10-CM | POA: Diagnosis present

## 2019-11-28 DIAGNOSIS — R0902 Hypoxemia: Secondary | ICD-10-CM | POA: Diagnosis not present

## 2019-11-28 DIAGNOSIS — Z87891 Personal history of nicotine dependence: Secondary | ICD-10-CM

## 2019-11-28 DIAGNOSIS — Z8041 Family history of malignant neoplasm of ovary: Secondary | ICD-10-CM

## 2019-11-28 DIAGNOSIS — R000 Tachycardia, unspecified: Secondary | ICD-10-CM | POA: Diagnosis not present

## 2019-11-28 DIAGNOSIS — R0602 Shortness of breath: Secondary | ICD-10-CM | POA: Diagnosis not present

## 2019-11-28 HISTORY — DX: Heart failure, unspecified: I50.9

## 2019-11-28 HISTORY — DX: Other ill-defined heart diseases: I51.89

## 2019-11-28 LAB — BRAIN NATRIURETIC PEPTIDE: B Natriuretic Peptide: 188 pg/mL — ABNORMAL HIGH (ref 0.0–100.0)

## 2019-11-28 LAB — COMPREHENSIVE METABOLIC PANEL
ALT: 44 U/L (ref 0–44)
AST: 42 U/L — ABNORMAL HIGH (ref 15–41)
Albumin: 4.2 g/dL (ref 3.5–5.0)
Alkaline Phosphatase: 81 U/L (ref 38–126)
Anion gap: 11 (ref 5–15)
BUN: 13 mg/dL (ref 8–23)
CO2: 26 mmol/L (ref 22–32)
Calcium: 9.1 mg/dL (ref 8.9–10.3)
Chloride: 103 mmol/L (ref 98–111)
Creatinine, Ser: 0.64 mg/dL (ref 0.44–1.00)
GFR calc Af Amer: >60 mL/min (ref >60)
GFR calc non Af Amer: >60 mL/min (ref >60)
Glucose, Bld: 129 mg/dL — ABNORMAL HIGH (ref 70–99)
Potassium: 3.8 mmol/L (ref 3.5–5.1)
Sodium: 140 mmol/L (ref 135–145)
Total Bilirubin: 0.8 mg/dL (ref 0.3–1.2)
Total Protein: 7.7 g/dL (ref 6.5–8.1)

## 2019-11-28 LAB — CBC WITH DIFFERENTIAL/PLATELET
Abs Immature Granulocytes: 0.04 10*3/uL (ref 0.00–0.07)
Basophils Absolute: 0 10*3/uL (ref 0.0–0.1)
Basophils Relative: 0 %
Eosinophils Absolute: 0.2 10*3/uL (ref 0.0–0.5)
Eosinophils Relative: 2 %
HCT: 44.4 % (ref 36.0–46.0)
Hemoglobin: 14.1 g/dL (ref 12.0–15.0)
Immature Granulocytes: 0 %
Lymphocytes Relative: 14 %
Lymphs Abs: 1.5 10*3/uL (ref 0.7–4.0)
MCH: 28.8 pg (ref 26.0–34.0)
MCHC: 31.8 g/dL (ref 30.0–36.0)
MCV: 90.6 fL (ref 80.0–100.0)
Monocytes Absolute: 0.8 10*3/uL (ref 0.1–1.0)
Monocytes Relative: 7 %
Neutro Abs: 8.1 10*3/uL — ABNORMAL HIGH (ref 1.7–7.7)
Neutrophils Relative %: 77 %
Platelets: 395 10*3/uL (ref 150–400)
RBC: 4.9 MIL/uL (ref 3.87–5.11)
RDW: 14.1 % (ref 11.5–15.5)
WBC: 10.6 10*3/uL — ABNORMAL HIGH (ref 4.0–10.5)
nRBC: 0 % (ref 0.0–0.2)

## 2019-11-28 LAB — HIV ANTIBODY (ROUTINE TESTING W REFLEX): HIV Screen 4th Generation wRfx: NONREACTIVE

## 2019-11-28 LAB — POC SARS CORONAVIRUS 2 AG: SARS Coronavirus 2 Ag: NEGATIVE

## 2019-11-28 LAB — TROPONIN I (HIGH SENSITIVITY)
Troponin I (High Sensitivity): 52 ng/L — ABNORMAL HIGH (ref <18)
Troponin I (High Sensitivity): 256 ng/L (ref <18)

## 2019-11-28 MED ORDER — CALCIUM CARBONATE-VITAMIN D 500-200 MG-UNIT PO TABS
1.0000 | ORAL_TABLET | Freq: Two times a day (BID) | ORAL | Status: DC
  Administered 2019-11-28 – 2019-11-29 (×3): 1 via ORAL
  Filled 2019-11-28 (×5): qty 1

## 2019-11-28 MED ORDER — FENOFIBRATE 160 MG PO TABS
160.0000 mg | ORAL_TABLET | Freq: Every day | ORAL | Status: DC
  Administered 2019-11-28 – 2019-12-03 (×5): 160 mg via ORAL
  Filled 2019-11-28 (×6): qty 1

## 2019-11-28 MED ORDER — CEPHALEXIN 500 MG PO CAPS
500.0000 mg | ORAL_CAPSULE | Freq: Two times a day (BID) | ORAL | Status: DC
  Administered 2019-11-28 – 2019-11-29 (×2): 500 mg via ORAL
  Filled 2019-11-28 (×2): qty 1

## 2019-11-28 MED ORDER — SODIUM CHLORIDE 0.9 % IV SOLN: 250.0000 mL | INTRAVENOUS | Status: DC | PRN

## 2019-11-28 MED ORDER — LORAZEPAM 2 MG/ML IJ SOLN
1.0000 mg | Freq: Once | INTRAMUSCULAR | Status: AC
  Administered 2019-11-28: 1 mg via INTRAVENOUS
  Filled 2019-11-28: qty 1

## 2019-11-28 MED ORDER — ACETAMINOPHEN 325 MG PO TABS: 650.0000 mg | ORAL_TABLET | ORAL | Status: DC | PRN

## 2019-11-28 MED ORDER — ADULT MULTIVITAMIN W/MINERALS CH: 1.0000 | ORAL_TABLET | Freq: Every day | ORAL | Status: DC

## 2019-11-28 MED ORDER — SODIUM CHLORIDE 0.9% FLUSH: 3.0000 mL | INTRAVENOUS | Status: DC | PRN

## 2019-11-28 MED ORDER — ASPIRIN EC 81 MG PO TBEC
81.0000 mg | DELAYED_RELEASE_TABLET | Freq: Every day | ORAL | Status: DC
  Administered 2019-11-28 – 2019-12-03 (×4): 81 mg via ORAL
  Filled 2019-11-28 (×4): qty 1

## 2019-11-28 MED ORDER — ATORVASTATIN CALCIUM 20 MG PO TABS
40.0000 mg | ORAL_TABLET | Freq: Every day | ORAL | Status: DC
  Administered 2019-11-28 – 2019-12-03 (×6): 40 mg via ORAL
  Filled 2019-11-28 (×6): qty 2

## 2019-11-28 MED ORDER — CARVEDILOL 6.25 MG PO TABS
6.2500 mg | ORAL_TABLET | Freq: Two times a day (BID) | ORAL | Status: DC
  Administered 2019-11-28 – 2019-12-03 (×10): 6.25 mg via ORAL
  Filled 2019-11-28 (×10): qty 1

## 2019-11-28 MED ORDER — FLUOXETINE HCL 20 MG PO CAPS
20.0000 mg | ORAL_CAPSULE | Freq: Every day | ORAL | Status: DC
  Administered 2019-11-28 – 2019-12-03 (×5): 20 mg via ORAL
  Filled 2019-11-28 (×6): qty 1

## 2019-11-28 MED ORDER — NITROGLYCERIN 2 % TD OINT
1.0000 [in_us] | TOPICAL_OINTMENT | Freq: Once | TRANSDERMAL | Status: AC
  Administered 2019-11-28: 1 [in_us] via TOPICAL
  Filled 2019-11-28: qty 1

## 2019-11-28 MED ORDER — OCUVITE-LUTEIN PO CAPS
1.0000 | ORAL_CAPSULE | Freq: Every day | ORAL | Status: DC
  Administered 2019-11-28: 1 via ORAL
  Filled 2019-11-28 (×2): qty 1

## 2019-11-28 MED ORDER — LOSARTAN POTASSIUM 50 MG PO TABS
100.0000 mg | ORAL_TABLET | Freq: Every day | ORAL | Status: DC
  Administered 2019-11-28 – 2019-12-03 (×5): 100 mg via ORAL
  Filled 2019-11-28 (×5): qty 2

## 2019-11-28 MED ORDER — ENOXAPARIN SODIUM 40 MG/0.4ML ~~LOC~~ SOLN
40.0000 mg | SUBCUTANEOUS | Status: DC
  Administered 2019-11-28 – 2019-12-01 (×4): 40 mg via SUBCUTANEOUS
  Filled 2019-11-28 (×4): qty 0.4

## 2019-11-28 MED ORDER — SODIUM CHLORIDE 0.9% FLUSH
3.0000 mL | Freq: Two times a day (BID) | INTRAVENOUS | Status: DC
  Administered 2019-11-28 – 2019-12-01 (×7): 3 mL via INTRAVENOUS

## 2019-11-28 MED ORDER — ONDANSETRON HCL 4 MG/2ML IJ SOLN: 4.0000 mg | Freq: Four times a day (QID) | INTRAMUSCULAR | Status: DC | PRN

## 2019-11-28 MED ORDER — FUROSEMIDE 10 MG/ML IJ SOLN
40.0000 mg | Freq: Once | INTRAMUSCULAR | Status: AC
  Administered 2019-11-28: 40 mg via INTRAVENOUS
  Filled 2019-11-28: qty 4

## 2019-11-28 MED ORDER — TIZANIDINE HCL 4 MG PO TABS
4.0000 mg | ORAL_TABLET | Freq: Every evening | ORAL | Status: DC | PRN
  Filled 2019-11-28: qty 1

## 2019-11-28 MED ORDER — FUROSEMIDE 10 MG/ML IJ SOLN
40.0000 mg | Freq: Two times a day (BID) | INTRAMUSCULAR | Status: DC
  Administered 2019-11-28 – 2019-11-30 (×3): 40 mg via INTRAVENOUS
  Filled 2019-11-28 (×3): qty 4

## 2019-11-28 NOTE — H&P (Signed)
History and Physical    [name] [name] [id number] DOB: [birth year] DOA: 11/28/2019  PCP: Owens Loffler, MD   Patient coming from: Home  I have personally briefly reviewed patient's old medical records in [place]  Chief Complaint: Shortness of breath  HPI: [name] [name] is a 69 y.o. female with medical history significant for chronic diastolic dysfunction CHF, coronary artery disease, hypertension and dyslipidemia who presents to the emergency room for evaluation of shortness of breath that has progressively worsened over the last 2 weeks.  Initially her shortness of breath was with exertion, but now she is short of breath at rest.  Shortness of breath is associated with a nonproductive cough.  She denies having any leg swelling, no chest pain, nausea, vomiting, fever, chills, diaphoresis or palpitations.  She has not had her Covid vaccination  ED Course: Patient presented to the emergency room for evaluation of shortness of breath.  Upon arrival to the ER she was noted to be tachycardic and tachypneic with significant elevated blood pressure, 169/112.  She was noted to have increased work of breathing and Rales on exam.  Chest x-ray was suggestive of CHF and patient was placed on noninvasive mechanical ventilation. She also received a dose of nitroglycerin and Lasix.  She will be admitted to the hospital for further evaluation  Review of Systems: As per HPI otherwise 10 point review of systems negative. Depression   Past Medical History:  Diagnosis Date  . Allergic rhinitis   . Avulsion fracture of distal fibula 3/10   and sprain; Left  . Breast cancer [facility]) 2004   Left Breast Cancer  . Coronary artery disease    Cardiac catheterization in April of 2014 at [facility]: 50% mid RCA stenosis and mild proximal disease in the left circumflex. Ejection fraction was 50%.  . History of hepatitis B    ?--tested + by [employer]  . HLD (hyperlipidemia)   . HTN (hypertension)   .  Osteoarthritis   . Osteopenia     Past Surgical History:  Procedure Laterality Date  . ABDOMINAL HYSTERECTOMY  1985   endometriosis; anatomy  . BREAST SURGERY  10/2002  . CARDIAC CATHETERIZATION  12/19/12   [facility]; EF 50%  . COLONOSCOPY  8/09   Hyperplastic polyp; repeat in 10 years  . HIP FRACTURE SURGERY  06/14/2011   ball replaced (Dr. Earvin Hansen)  . MASTECTOMY Left 2004  . TONSILLECTOMY  1958     reports that she has quit smoking. Her smoking use included cigarettes. She has a 5.00 pack-year smoking history. She quit smokeless tobacco use about 33 years ago. She reports that she does not drink alcohol or use drugs.  Allergies  Allergen Reactions  . Ace Inhibitors Cough  . Codeine Other (See Comments)    Reation:  Hallucinations   . Morphine Other (See Comments)    Reaction:  Hallucinations   . Sulfonamide Derivatives Rash    Family History  Problem Relation Age of Onset  . Heart attack Father 84  . Heart disease Father   . Sarcoidosis Mother   . Ovarian cancer Other        Aunt  . Diabetes Other        GP  . Breast cancer Sister 27  . Cancer Sister        breast  . Cancer Paternal Aunt        colon/ovarian     Prior to Admission medications   Medication Sig Start Date End  Date Taking? Authorizing Provider  tiZANidine (ZANAFLEX) 4 MG tablet TAKE 1 TABLET (4 MG TOTAL) BY MOUTH AT BEDTIME AS NEEDED FOR MUSCLE SPASMS. 11/24/19 02/22/20  Copland, Frederico Hamman, MD  aspirin EC 81 MG tablet Take 81 mg by mouth daily.    [provider]  Calcium Carbonate-Vitamin D (CALCIUM 600+D) 600-400 MG-UNIT tablet Take 1 tablet by mouth 2 (two) times daily.    [provider]  fenofibrate (TRICOR) 145 MG tablet Take 1 tablet (145 mg total) by mouth daily. 08/22/19   Tower, Wynelle Fanny, MD  FLUoxetine (PROZAC) 20 MG capsule Take 1 capsule by mouth once daily 08/12/19   Copland, Frederico Hamman, MD  fluticasone (FLONASE) 50 MCG/ACT nasal spray Place 1-2 sprays into both nostrils daily as  needed for rhinitis. 01/10/18   Tower, Wynelle Fanny, MD  GRAPE SEED EXTRACT PO Take 1 capsule by mouth 2 (two) times daily.     [provider]  Javier Docker Oil 1000 MG CAPS Take 1,000 mg by mouth at bedtime.    [provider]  losartan (COZAAR) 100 MG tablet Take 1 tablet by mouth once daily 08/12/19   Copland, Frederico Hamman, MD  meclizine (ANTIVERT) 25 MG tablet Take 0.5-1 tablets (12.5-25 mg total) by mouth 3 (three) times daily as needed for dizziness (sedation caution). 03/15/18   Tonia Ghent, MD  meloxicam (MOBIC) 15 MG tablet Take 1 tablet (15 mg total) by mouth daily as needed for pain. With food 11/05/19   Tower, Wynelle Fanny, MD  Misc Natural Products (OSTEO BI-FLEX ADV JOINT SHIELD) TABS Take 1 tablet by mouth 2 (two) times daily.    [provider]  Multiple Vitamin (MULTIVITAMIN WITH MINERALS) TABS tablet Take 1 tablet by mouth daily.    [provider]  Multiple Vitamins-Minerals (PRESERVISION AREDS 2+MULTI VIT) CAPS Take 1 capsule by mouth daily.    [provider]  nystatin cream (MYCOSTATIN) Apply 1 application topically 2 (two) times daily. 03/18/18   Owens Loffler, MD    Physical Exam: Vitals:   11/28/19 1000 11/28/19 1030 11/28/19 1053 11/28/19 1100  BP: (!) 175/107 (!) 187/128  (!) 183/161  Pulse: (!) 106 (!) 120 (!) 131 (!) 116  Resp: (!) 25 (!) 34 (!) 24 20  Temp:      TempSrc:      SpO2: 99% 98% 91% 98%  Weight:      Height:         Vitals:   11/28/19 1000 11/28/19 1030 11/28/19 1053 11/28/19 1100  BP: (!) 175/107 (!) 187/128  (!) 183/161  Pulse: (!) 106 (!) 120 (!) 131 (!) 116  Resp: (!) 25 (!) 34 (!) 24 20  Temp:      TempSrc:      SpO2: 99% 98% 91% 98%  Weight:      Height:        Constitutional: NAD, alert and oriented x 3 Eyes: PERRL, lids and conjunctivae normal ENMT: Mucous membranes are moist.  Neck: normal, supple, no masses, no thyromegaly Respiratory: Rales at the bases, no wheezing, increased work of breathing,   Normal respiratory effort. No accessory muscle use.  Cardiovascular: Regular rate and rhythm, no murmurs / rubs / gallops. No extremity edema. 2+ pedal pulses. No carotid bruits.  Abdomen: no tenderness, no masses palpated. No hepatosplenomegaly. Bowel sounds positive.  Musculoskeletal: no clubbing / cyanosis. No joint deformity upper and lower extremities.  Skin: no rashes, lesions, ulcers.  Neurologic: No gross focal neurologic deficit. Psychiatric: Normal mood and  affect.   Labs on Admission: I have personally reviewed following labs and imaging studies  CBC: Recent Labs  Lab 11/28/19 0927  WBC 10.6*  NEUTROABS 8.1*  HGB 14.1  HCT 44.4  MCV 90.6  PLT [id number]   Basic Metabolic Panel: Recent Labs  Lab 11/28/19 0927  NA 140  K 3.8  CL 103  CO2 26  GLUCOSE 129*  BUN 13  CREATININE 0.64  CALCIUM 9.1   GFR: Estimated Creatinine Clearance: 74.8 mL/min (by C-G formula based on SCr of 0.64 mg/dL). Liver Function Tests: Recent Labs  Lab 11/28/19 0927  AST 42*  ALT 44  ALKPHOS 81  BILITOT 0.8  PROT 7.7  ALBUMIN 4.2   No results for input(s): LIPASE, AMYLASE in the last 168 hours. No results for input(s): AMMONIA in the last 168 hours. Coagulation Profile: No results for input(s): INR, PROTIME in the last 168 hours. Cardiac Enzymes: No results for input(s): CKTOTAL, CKMB, CKMBINDEX, TROPONINI in the last 168 hours. BNP (last 3 results) No results for input(s): PROBNP in the last 8760 hours. HbA1C: No results for input(s): HGBA1C in the last 72 hours. CBG: No results for input(s): GLUCAP in the last 168 hours. Lipid Profile: No results for input(s): CHOL, HDL, LDLCALC, TRIG, CHOLHDL, LDLDIRECT in the last 72 hours. Thyroid Function Tests: No results for input(s): TSH, T4TOTAL, FREET4, T3FREE, THYROIDAB in the last 72 hours. Anemia Panel: No results for input(s): VITAMINB12, FOLATE, FERRITIN, TIBC, IRON, RETICCTPCT in the last 72 hours. Urine analysis:      Component Value Date/Time   COLORURINE YELLOW 06/18/2011 1244   APPEARANCEUR CLOUDY (A) 06/18/2011 1244   LABSPEC 1.013 06/18/2011 1244   PHURINE 7.5 06/18/2011 1244   GLUCOSEU NEGATIVE 06/18/2011 1244   HGBUR TRACE (A) 06/18/2011 1244   BILIRUBINUR NEGATIVE 06/18/2011 [place] 06/18/2011 1244   PROTEINUR NEGATIVE 06/18/2011 1244   UROBILINOGEN 1.0 06/18/2011 1244   NITRITE POSITIVE (A) 06/18/2011 1244   LEUKOCYTESUR MODERATE (A) 06/18/2011 1244    Radiological Exams on Admission: DG Chest Portable 1 View  Result Date: 11/28/2019 CLINICAL DATA:  Shortness of breath for 2 weeks EXAM: PORTABLE CHEST 1 VIEW COMPARISON:  CT chest 10/05/2016 FINDINGS: There is bilateral interstitial and patchy alveolar airspace opacities. There is no pleural effusion or pneumothorax. There is stable cardiomegaly. There is no acute osseous abnormality. IMPRESSION: Bilateral interstitial and patchy alveolar airspace opacities concerning for pulmonary edema versus multilobar pneumonia. Electronically Signed   By: Kathreen Devoid   On: 11/28/2019 09:48    EKG: Independently reviewed.  Sinus tachycardia with occasional PVCs  Assessment/Plan Principal Problem:   Acute on chronic diastolic CHF (congestive heart failure) (HCC) Active Problems:   Hypertensive urgency   Elevated troponin   Depression   Acute lower UTI   Acute on chronic respiratory failure with hypoxia (HCC)      Acute on chronic diastolic dysfunction CHF Patient presents to the emergency room for evaluation of shortness of breath which has progressively worsened over the last 2 weeks. Blood pressure was significantly elevated on admission Patient's last 2D echocardiogram showed a normal LVEF (From 2014) Will place patient on Lasix 40 mg IV every 12 Optimize blood pressure control.  Continue losartan  And will add carvedilol Obtain 2D echocardiogram Request cardiology consult   Hypertensive urgency Likely secondary to  medication noncompliance Optimize blood pressure control    Elevated troponin Likely related to troponin leak from acute CHF Obtain serial troponin levels   UTI  Patient has pyuria and had elevated WBC count Prior urine culture yielded E. coli Treat empirically with Keflex 500 mg twice daily for 3 days Follow up results of urine culture   Acute on chronic respiratory failure Secondary to acute CHF exacerbation Patient presented with increased work of breathing and was tachycardic and tachypneic She is currently on BiPAP Will attempt to wean patient off BiPAP with improvement in her symptoms  DVT prophylaxis: Lovenox Code Status: Full code Family Communication: Plan of care has been discussed with patient. She verbalizes understanding and agrees with the plan Disposition Plan: Back to previous home environment Consults called: Cardiology consult    Edilia Ghuman MD [facility]     11/28/2019, 11:35 AM

## 2019-11-28 NOTE — ED Notes (Signed)
Pt provided w/ meal tray.

## 2019-11-28 NOTE — Consult Note (Addendum)
Cardiology Consult    Patient ID: CARIA WAPLE MRN: [id number], DOB/AGE: 69-Sep-1952   Admit date: 11/28/2019 Date of Consult: 11/28/2019  Primary Physician: Owens Loffler, MD Primary Cardiologist: Kathlyn Sacramento, MD Requesting Provider: Milon Dikes, MD  Patient Profile    [name] [name] is a 69 y.o. female with a history of non-obstructive CAD, hypertension, hyperlipidemia, diastolic dysfunction, arthritis, and breast cancer, who is being seen today for the evaluation of acute heart failure and hypertensive urgency at the request of Dr. Francine Graven.  Past Medical History   Past Medical History:  Diagnosis Date  . Allergic rhinitis   . Avulsion fracture of distal fibula 3/10   and sprain; Left  . Breast cancer [facility]) 2004   Left Breast Cancer  . Coronary artery disease    a. 10/2012 ETT: nl; b. 12/2012 Cath: LM nl, LAD min irregs, D1 min irregs, LCX 30p, RCA 81m, RPDA 40p, EF 50%.  . Diastolic dysfunction    a. 12/2012 Echo: EF 50-55%. No rwma. Gr1 DD. Nl RV fxn. Nl PASP.  [name] Kitchen History of hepatitis B    ?--tested + by [employer]  . HLD (hyperlipidemia)   . HTN (hypertension)   . Osteoarthritis   . Osteopenia     Past Surgical History:  Procedure Laterality Date  . ABDOMINAL HYSTERECTOMY  1985   endometriosis; anatomy  . BREAST SURGERY  10/2002  . CARDIAC CATHETERIZATION  12/19/12   [facility]; EF 50%  . COLONOSCOPY  8/09   Hyperplastic polyp; repeat in 10 years  . HIP FRACTURE SURGERY  06/14/2011   ball replaced (Dr. Earvin Hansen)  . MASTECTOMY Left 2004  . TONSILLECTOMY  1958     Allergies  Allergies  Allergen Reactions  . Ace Inhibitors Cough  . Codeine Other (See Comments)    Reation:  Hallucinations   . Morphine Other (See Comments)    Reaction:  Hallucinations   . Sulfonamide Derivatives Rash    History of Present Illness    69 year old female with above past medical history including nonobstructive CAD, hypertension, hyperlipidemia, diastolic dysfunction, arthritis,  and breast cancer.  She was previously seen by Dr. Fletcher Anon in 2014 with normal treadmill testing in February 2014 but due to ongoing symptoms, she underwent diagnostic catheterization April 2014 revealing a 30% proximal left circumflex stenosis, 50% mid RCA stenosis, and 40% RPDA stenosis.  EF was 50%.  Echocardiogram performed around the same time showed normal LV function with grade 1 diastolic dysfunction.  Unfortunately, she lost her insurance and was lost to follow-up.  Over the past 7 years, she says that she has mostly done well.  She lives locally with some tenants.  She has a dog that she walks for 15 minutes twice a day.  She usually can do this without any symptoms or limitations.  Beginning about 2 weeks ago, she started noticing dyspnea on exertion with walking her dog as well as simple activities around her house.  She also started to note orthopnea which has progressed over the past 2 weeks.  She weighs herself once a week and says her weight has been coming down.  She has not had any lower extremity edema, early satiety, increase in abdominal girth, or chest pain.  This morning at approximately 4:30 AM, she awoke to let her cat out and upon returning to bed, she felt more short of breath.  She tried to "walk it off" but this only resulted in worsening dyspnea and anxiety.  At approximately 7 AM,  she called her sister who then called 911.  Patient was taken to the [facility] ED where her pressure on arrival was 169/120 and rose to 187/128.  She required BiPAP initially.  Chest x-ray showed bilateral interstitial and patchy alveolar airspace opacities concerning for pulmonary edema though BNP was only 188.  Initial high-sensitivity troponin is elevated at 52.  Lab work otherwise unremarkable.  She was treated with 40 mg of intravenous Lasix and nitroglycerin paste with 1 L out and improvement in dyspnea.  She is currently on nasal cannula.  She is resting comfortably but remains mildly tachycardic  (sinus).  Inpatient Medications    . aspirin EC  81 mg Oral Daily  . calcium-vitamin D  1 tablet Oral BID  . carvedilol  6.25 mg Oral BID WC  . cephALEXin  500 mg Oral Q12H  . enoxaparin (LOVENOX) injection  40 mg Subcutaneous Q24H  . fenofibrate  160 mg Oral Daily  . FLUoxetine  20 mg Oral Daily  . furosemide  40 mg Intravenous Q12H  . losartan  100 mg Oral Daily  . multivitamin-lutein  1 capsule Oral Daily  . sodium chloride flush  3 mL Intravenous Q12H    Family History    Family History  Problem Relation Age of Onset  . Heart attack Father 35  . Heart disease Father   . Sarcoidosis Mother   . Ovarian cancer Other        Aunt  . Diabetes Other        GP  . Breast cancer Sister 78  . Cancer Sister        breast  . Cancer Paternal Aunt        colon/ovarian   She indicated that her mother is deceased. She indicated that her father is alive. She indicated that the status of her sister is unknown. She indicated that the status of her paternal aunt is unknown.   Social History    Social History   Socioeconomic History  . Marital status: Married    Spouse name: (Widow) to [place]  . Number of children: Not on file  . Years of education: Not on file  . Highest education level: Not on file  Occupational History  . Not on file  Tobacco Use  . Smoking status: Former Smoker    Packs/day: 0.50    Years: 10.00    Pack years: 5.00    Types: Cigarettes  . Smokeless tobacco: Former Systems developer    Quit date: 01/24/1986  . Tobacco comment: 1989 quit   Substance and Sexual Activity  . Alcohol use: No  . Drug use: No  . Sexual activity: Not on file  Other Topics Concern  . Not on file  Social History Narrative   Widowed to husband Renato Gails      No regular exercise   Lives locally w/ tenants that help her w/ chores around the house.   Social Determinants of Health   Financial Resource Strain:   . Difficulty of Paying Living Expenses:   Food Insecurity:   .  Worried About Charity fundraiser in the Last Year:   . Arboriculturist in the Last Year:   Transportation Needs:   . Film/video editor (Medical):   [name] Kitchen Lack of Transportation (Non-Medical):   Physical Activity:   . Days of Exercise per Week:   . Minutes of Exercise per Session:   Stress:   . Feeling of Stress :  Social Connections:   . Frequency of Communication with Friends and Family:   . Frequency of Social Gatherings with Friends and Family:   . Attends Religious Services:   . Active Member of Clubs or Organizations:   . Attends Archivist Meetings:   [name] Kitchen Marital Status:   Intimate Partner Violence:   . Fear of Current or Ex-Partner:   . Emotionally Abused:   [name] Kitchen Physically Abused:   . Sexually Abused:      Review of Systems    General:  No chills, fever, night sweats or weight changes.  Cardiovascular:  No chest pain, +++ progressive dyspnea on exertion, no edema, +++ orthopnea, no palpitations, paroxysmal nocturnal dyspnea. Dermatological: No rash, lesions/masses Respiratory: No cough, +++ dyspnea Urologic: No hematuria, dysuria Abdominal:   No nausea, vomiting, diarrhea, bright red blood per rectum, melena, or hematemesis Neurologic:  No visual changes, wkns, changes in mental status. All other systems reviewed and are otherwise negative except as noted above.  Physical Exam    Blood pressure (!) 149/108, pulse 93, temperature 98.3 F (36.8 C), temperature source Oral, resp. rate (!) 24, height 5\' 4"  (1.626 m), weight 93.9 kg, SpO2 99 %.  General: Pleasant, NAD Psych: Normal affect. Neuro: Alert and oriented X 3. Moves all extremities spontaneously. HEENT: Normal  Neck: Supple without bruits. JVD to jaw. Lungs:  Resp regular and unlabored, crackles one third of the way up bilaterally. Heart: RRR, tachycardic, no s3, s4, or murmurs. Abdomen: Soft, non-tender, non-distended, BS + x 4.  Extremities: No clubbing, cyanosis or edema. DP/PT/Radials 2+ and  equal bilaterally.  Labs    Cardiac Enzymes Recent Labs  Lab 11/28/19 0927  TROPONINIHS 52*      Lab Results  Component Value Date   WBC 10.6 (H) 11/28/2019   HGB 14.1 11/28/2019   HCT 44.4 11/28/2019   MCV 90.6 11/28/2019   PLT 395 11/28/2019    Recent Labs  Lab 11/28/19 0927  NA 140  K 3.8  CL 103  CO2 26  BUN 13  CREATININE 0.64  CALCIUM 9.1  PROT 7.7  BILITOT 0.8  ALKPHOS 81  ALT 44  AST 42*  GLUCOSE 129*   Lab Results  Component Value Date   CHOL 330 (H) 04/23/2019   HDL 41.20 04/23/2019   LDLCALC 76 01/19/2014   TRIG (H) 04/23/2019    439.0 Triglyceride is over 400; calculations on Lipids are invalid.    Radiology Studies    DG Chest Portable 1 View  Result Date: 11/28/2019 CLINICAL DATA:  Shortness of breath for 2 weeks EXAM: PORTABLE CHEST 1 VIEW COMPARISON:  CT chest 10/05/2016 FINDINGS: There is bilateral interstitial and patchy alveolar airspace opacities. There is no pleural effusion or pneumothorax. There is stable cardiomegaly. There is no acute osseous abnormality. IMPRESSION: Bilateral interstitial and patchy alveolar airspace opacities concerning for pulmonary edema versus multilobar pneumonia. Electronically Signed   By: Kathreen Devoid   On: 11/28/2019 09:48    ECG & Cardiac Imaging    Sinus tachycardia, 113, LVH with repolarization abnormalities- personally reviewed.  Assessment & Plan    1.  Acute congestive heart failure: Presumably diastolic heart failure in the setting of hypertensive urgency and previously known diastolic dysfunction.  Echo in 2014 showed normal LV function.  She had been doing well until approximately 2 weeks ago when she noted progressive dyspnea on exertion and orthopnea.  She awoke 430 this morning with worsening dyspnea and restlessness and eventually called EMS.  On arrival here, she required BiPAP.  Pressure elevated to 187/128.  Chest x-ray with pulmonary edema.  Following intravenous Lasix-40 mg-she is 1000 L out  and was able to come off of BiPAP.  She continues to have JVD to her jaw and crackles on examination.  Agree with admission for IV diuresis.  Follow-up echocardiogram.  Continue home dose of losartan and agree with addition of carvedilol, which can be titrated further as necessary.  Further recommendations pending echo.  2.  Hypertensive urgency: As above, pressure rose to 187/128 in the emergency department.  Suspect blood pressure has been poorly controlled at home with resultant worsening dyspnea and flash pulmonary edema this morning.  Pressure currently improved at 149/108 though obviously not ideal.  Agree with resumption of losartan and addition of carvedilol.  Continue to follow and adjust carvedilol as needed.  Diuresis as above.  3.  Elevated troponin: In the setting of above, high-sensitivity troponin is 52.  She denies any prior history of chest pain.  Prior cath in 2014 with nonobstructive circumflex, RCA, and RPDA disease.  Suspect demand ischemia in the setting of above though recommend ongoing trending of troponin.  Depending upon troponin rise and EF, she will need either stress testing or diagnostic catheterization.  Continue aspirin and beta-blocker therapy.  She is on fenofibrate.  In the setting of possible ACS, I will add high potency statin (total cholesterol 330 in August 2020).  4.  Hyperlipidemia: Total cholesterol 330 in August 2020.  Triglycerides greater than 400.  Add high potency statin.  Continue fenofibrate.  Follow-up lipids.  Signed, Murray Hodgkins, NP 11/28/2019, 2:29 PM  For questions or updates, please contact   Please consult [email or web address] for contact info under Cardiology/STEMI.

## 2019-11-28 NOTE — ED Notes (Signed)
Placed pt on 2L [place] after consulting w/ RT. Pt current sat 97% w/ WOB regular.

## 2019-11-28 NOTE — ED Provider Notes (Addendum)
[facility] Emergency Department Provider Note  ____________________________________________   First MD Initiated Contact with Patient 11/28/19 [phone]     (approximate)  I have reviewed the triage vital signs and the nursing notes.   HISTORY  Chief Complaint Shortness of Breath    HPI [name] [name] is a 69 y.o. female  With h/o HTN, HLD, CAD, HFpEF on last TTE here with SOB. Pt reports 2 weeks of progressively worsening SOB. Her sx began gradually and initially were with exertion only, but now present at rest. Over the past 24 hours she has had acute worsening of her SOB along with sensation like she can not exert herself due to the dyspnea. She has had associated cough, non-productive. She denies any chest pain, leg swelling. She admits she has not been on her coreg/meds rx'ed by her prior Cardiologist due to insurance issues. She does also have a COVID exposure in the house, though they were "cleared" in early March and maintained distance. No known fever, chills. Has not had her COVID vaccination.       Past Medical History:  Diagnosis Date  . Allergic rhinitis   . Avulsion fracture of distal fibula 3/10   and sprain; Left  . Breast cancer [facility]) 2004   Left Breast Cancer  . Coronary artery disease    Cardiac catheterization in April of 2014 at [facility]: 50% mid RCA stenosis and mild proximal disease in the left circumflex. Ejection fraction was 50%.  . History of hepatitis B    ?--tested + by [employer]  . HLD (hyperlipidemia)   . HTN (hypertension)   . Osteoarthritis   . Osteopenia     Patient Active Problem List   Diagnosis Date Noted  . Acute left-sided low back pain 11/05/2019  . Vertigo 03/17/2018  . Chronic diastolic heart failure ([place]) 06/18/2013  . Coronary artery disease   . OTHER MALAISE AND FATIGUE 12/28/2008  . Hyperlipidemia LDL goal <70 09/28/2008  . Essential hypertension 09/28/2008  . ALLERGIC RHINITIS 09/28/2008  .  OSTEOARTHRITIS 09/28/2008  . DCIS (ductal carcinoma in situ), LEFT, remission 09/28/2008    Past Surgical History:  Procedure Laterality Date  . ABDOMINAL HYSTERECTOMY  1985   endometriosis; anatomy  . BREAST SURGERY  10/2002  . CARDIAC CATHETERIZATION  12/19/12   [facility]; EF 50%  . COLONOSCOPY  8/09   Hyperplastic polyp; repeat in 10 years  . HIP FRACTURE SURGERY  06/14/2011   ball replaced (Dr. Earvin Hansen)  . MASTECTOMY Left 2004  . TONSILLECTOMY  1958    Prior to Admission medications   Medication Sig Start Date End Date Taking? Authorizing Provider  tiZANidine (ZANAFLEX) 4 MG tablet TAKE 1 TABLET (4 MG TOTAL) BY MOUTH AT BEDTIME AS NEEDED FOR MUSCLE SPASMS. 11/24/19 02/22/20  Copland, Frederico Hamman, MD  aspirin EC 81 MG tablet Take 81 mg by mouth daily.    [provider]  Calcium Carbonate-Vitamin D (CALCIUM 600+D) 600-400 MG-UNIT tablet Take 1 tablet by mouth 2 (two) times daily.    [provider]  fenofibrate (TRICOR) 145 MG tablet Take 1 tablet (145 mg total) by mouth daily. 08/22/19   Tower, Wynelle Fanny, MD  FLUoxetine (PROZAC) 20 MG capsule Take 1 capsule by mouth once daily 08/12/19   Copland, Frederico Hamman, MD  fluticasone (FLONASE) 50 MCG/ACT nasal spray Place 1-2 sprays into both nostrils daily as needed for rhinitis. 01/10/18   Tower, Wynelle Fanny, MD  GRAPE SEED EXTRACT PO Take 1 capsule by mouth  2 (two) times daily.     [provider]  Javier Docker Oil 1000 MG CAPS Take 1,000 mg by mouth at bedtime.    [provider]  losartan (COZAAR) 100 MG tablet Take 1 tablet by mouth once daily 08/12/19   Copland, Frederico Hamman, MD  meclizine (ANTIVERT) 25 MG tablet Take 0.5-1 tablets (12.5-25 mg total) by mouth 3 (three) times daily as needed for dizziness (sedation caution). 03/15/18   Tonia Ghent, MD  meloxicam (MOBIC) 15 MG tablet Take 1 tablet (15 mg total) by mouth daily as needed for pain. With food 11/05/19   Tower, Wynelle Fanny, MD  Misc Natural Products (OSTEO BI-FLEX ADV JOINT  SHIELD) TABS Take 1 tablet by mouth 2 (two) times daily.    [provider]  Multiple Vitamin (MULTIVITAMIN WITH MINERALS) TABS tablet Take 1 tablet by mouth daily.    [provider]  Multiple Vitamins-Minerals (PRESERVISION AREDS 2+MULTI VIT) CAPS Take 1 capsule by mouth daily.    [provider]  nystatin cream (MYCOSTATIN) Apply 1 application topically 2 (two) times daily. 03/18/18   Copland, Frederico Hamman, MD    Allergies Ace inhibitors, Codeine, Morphine, and Sulfonamide derivatives  Family History  Problem Relation Age of Onset  . Heart attack Father 99  . Heart disease Father   . Sarcoidosis Mother   . Ovarian cancer Other        Aunt  . Diabetes Other        GP  . Breast cancer Sister 40  . Cancer Sister        breast  . Cancer Paternal Aunt        colon/ovarian    Social History Social History   Tobacco Use  . Smoking status: Former Smoker    Packs/day: 0.50    Years: 10.00    Pack years: 5.00    Types: Cigarettes  . Smokeless tobacco: Former Systems developer    Quit date: 01/24/1986  . Tobacco comment: 1989 quit   Substance Use Topics  . Alcohol use: No  . Drug use: No    Review of Systems  Review of Systems  Constitutional: Positive for fatigue. Negative for fever.  HENT: Negative for congestion and sore throat.   Eyes: Negative for visual disturbance.  Respiratory: Positive for cough and shortness of breath.   Cardiovascular: Negative for chest pain.  Gastrointestinal: Negative for abdominal pain, diarrhea, nausea and vomiting.  Genitourinary: Negative for flank pain.  Musculoskeletal: Negative for back pain and neck pain.  Skin: Negative for rash and wound.  Neurological: Positive for weakness.  All other systems reviewed and are negative.    ____________________________________________  PHYSICAL EXAM:      VITAL SIGNS: ED Triage Vitals  Enc Vitals Group     BP 11/28/19 0933 (!) 169/120     Pulse Rate 11/28/19 0933 (!) 113     Resp  11/28/19 0933 19     Temp 11/28/19 0933 98.3 F (36.8 C)     Temp Source 11/28/19 0933 Oral     SpO2 11/28/19 0933 99 %     Weight 11/28/19 0934 207 lb (93.9 kg)     Height 11/28/19 0934 5\' 4"  (1.626 m)     Head Circumference --      Peak Flow --      Pain Score 11/28/19 0934 0     Pain Loc --      Pain Edu? --      Excl. in [place]? --  Physical Exam Vitals and nursing note reviewed.  Constitutional:      General: She is not in acute distress.    Appearance: She is well-developed.  HENT:     Head: Normocephalic and atraumatic.  Eyes:     Conjunctiva/sclera: Conjunctivae normal.  Cardiovascular:     Rate and Rhythm: Normal rate and regular rhythm.     Heart sounds: Normal heart sounds. No murmur. No friction rub.  Pulmonary:     Effort: Pulmonary effort is normal. Tachypnea present. No respiratory distress.     Breath sounds: Examination of the right-middle field reveals rales. Examination of the left-middle field reveals rales. Examination of the right-lower field reveals rales. Examination of the left-lower field reveals rales. Rales present. No wheezing.  Abdominal:     General: There is no distension.     Palpations: Abdomen is soft.     Tenderness: There is no abdominal tenderness.  Musculoskeletal:     Cervical back: Neck supple.  Skin:    General: Skin is warm.     Capillary Refill: Capillary refill takes less than 2 seconds.  Neurological:     Mental Status: She is alert and oriented to person, place, and time.     Motor: No abnormal muscle tone.       ____________________________________________   LABS (all labs ordered are listed, but only abnormal results are displayed)  Labs Reviewed  CBC WITH DIFFERENTIAL/PLATELET - Abnormal; Notable for the following components:      Result Value   WBC 10.6 (*)    Neutro Abs 8.1 (*)    All other components within normal limits  COMPREHENSIVE METABOLIC PANEL - Abnormal; Notable for the following components:    Glucose, Bld 129 (*)    AST 42 (*)    All other components within normal limits  BRAIN NATRIURETIC PEPTIDE - Abnormal; Notable for the following components:   B Natriuretic Peptide 188.0 (*)    All other components within normal limits  TROPONIN I (HIGH SENSITIVITY) - Abnormal; Notable for the following components:   Troponin I (High Sensitivity) 52 (*)    All other components within normal limits  POC SARS CORONAVIRUS 2 AG -  ED    ____________________________________________  EKG: Sinus tachycardia, VR 113. QRS 116, QTc 467. TWI noted in lateral precordial leads. No acute St elevations ________________________________________  RADIOLOGY All imaging, including plain films, CT scans, and ultrasounds, independently reviewed by me, and interpretations confirmed via formal radiology reads.  ED MD interpretation:   CXR: Bilateral pulmonary edema  Official radiology report(s): DG Chest Portable 1 View  Result Date: 11/28/2019 CLINICAL DATA:  Shortness of breath for 2 weeks EXAM: PORTABLE CHEST 1 VIEW COMPARISON:  CT chest 10/05/2016 FINDINGS: There is bilateral interstitial and patchy alveolar airspace opacities. There is no pleural effusion or pneumothorax. There is stable cardiomegaly. There is no acute osseous abnormality. IMPRESSION: Bilateral interstitial and patchy alveolar airspace opacities concerning for pulmonary edema versus multilobar pneumonia. Electronically Signed   By: Kathreen Devoid   On: 11/28/2019 09:48    ____________________________________________  PROCEDURES   Procedure(s) performed (including Critical Care):  .1-3 Lead EKG Interpretation Performed by: Duffy Bruce, MD Authorized by: Duffy Bruce, MD     Interpretation: abnormal     ECG rate assessment: tachycardic     Rhythm: sinus tachycardia     Ectopy: PVCs     Conduction: normal   Comments:     Indication: Acute CHF and hypoxia, history of coronary disease.  Sinus tachycardia noted with  occasional PVCs, as expected.  No persistent arrhythmia. .Critical Care Performed by: Duffy Bruce, MD Authorized by: Duffy Bruce, MD   Critical care provider statement:    Critical care time (minutes):  35   Critical care time was exclusive of:  Separately billable procedures and treating other patients and teaching time   Critical care was necessary to treat or prevent imminent or life-threatening deterioration of the following conditions:  Circulatory failure, cardiac failure and respiratory failure   Critical care was time spent personally by me on the following activities:  Development of treatment plan with patient or surrogate, discussions with consultants, evaluation of patient's response to treatment, examination of patient, obtaining history from patient or surrogate, ordering and performing treatments and interventions, ordering and review of laboratory studies, ordering and review of radiographic studies, pulse oximetry, re-evaluation of patient's condition and review of old charts   I assumed direction of critical care for this patient from another provider in my specialty: no      ____________________________________________  INITIAL IMPRESSION / MDM / [place] / ED COURSE  As part of my medical decision making, I reviewed the following data within the [place] notes reviewed and incorporated, Old chart reviewed, Notes from prior ED visits, and [place] Controlled Substance Database       *DARONDA GAUR was evaluated in Emergency Department on 11/28/2019 for the symptoms described in the history of present illness. She was evaluated in the context of the global COVID-19 pandemic, which necessitated consideration that the patient might be at risk for infection with the SARS-CoV-2 virus that causes COVID-19. Institutional protocols and algorithms that pertain to the evaluation of patients at risk for COVID-19 are in a state of rapid change  based on information released by regulatory bodies including the CDC and federal and state organizations. These policies and algorithms were followed during the patient's care in the ED.  Some ED evaluations and interventions may be delayed as a result of limited staffing during the pandemic.*  Clinical Course as of Nov 28 1035  Fri Nov 28, 2019  0944 69 yo F with PMhx HTN, CAD, HFpEF on last TTE (though >5 yr ago) here with cough, SOB. On exam, pt tachycardic, tachypneic with bilateral rales and increased WOB. Primary suspicion is acute CHF exacerbation, though DDx includes COVID/viral PNA, CAP. No LE pain or asymmetry to suggest PE. EKG is nonischemic and I do not suspect ACS.   [CI]  [id number] CXR on my review is c/f CHF. She is tachycardic, tachypneic, and hypertensive. Will place on BIPAP while beginning diuresis and nitro.   [CI]  1036 Labs, imaging as above.  Troponin elevated and BNP elevated, all likely secondary to demand from CHF with hypertension.  Patient has persistent increased work of breathing.  Will place on BiPAP, nitroglycerin, and Lasix.   [CI]    Clinical Course User Index [CI] Duffy Bruce, MD    Medical Decision Making: As above.  Likely CHF.  Less likely infectious etiology.  ____________________________________________  FINAL CLINICAL IMPRESSION(S) / ED DIAGNOSES  Final diagnoses:  Acute on chronic diastolic congestive heart failure (HCC)  Acute respiratory failure with hypoxia (HCC)     MEDICATIONS GIVEN DURING THIS VISIT:  Medications  nitroGLYCERIN (NITROGLYN) 2 % ointment 1 inch (1 inch Topical Given 11/28/19 1002)  furosemide (LASIX) injection 40 mg (40 mg Intravenous Given 11/28/19 1007)     ED Discharge Orders    None  Note:  This document was prepared using Dragon voice recognition software and may include unintentional dictation errors.   Duffy Bruce, MD 11/28/19 1037    Duffy Bruce, MD 11/28/19 1747

## 2019-11-28 NOTE — Plan of Care (Signed)
  Problem: Education: Goal: Knowledge of General Education information will improve Description Including pain rating scale, medication(s)/side effects and non-pharmacologic comfort measures Outcome: Progressing   

## 2019-11-28 NOTE — ED Notes (Signed)
ED Provider at bedside. 

## 2019-11-28 NOTE — ED Triage Notes (Signed)
Pt arrived from home via [facility]. Pt c/o of SOB x 2 weeks and this morning it got worse. EMS recorded pt at 93% RA then 98% [place] 3L. Pt was in sinus tach, and BP 160/90.

## 2019-11-28 NOTE — Care Management (Signed)
TOC RN CM aware of consult. Daughter at bedside and patient in procedure with RT at time of visit. Unable to complete assessment.

## 2019-11-29 ENCOUNTER — Inpatient Hospital Stay (HOSPITAL_COMMUNITY)
Admit: 2019-11-29 | Discharge: 2019-11-29 | Disposition: A | Discharge status: Home / Self Care | Payer: Medicare Other | Attending: Internal Medicine | Admitting: Internal Medicine

## 2019-11-29 DIAGNOSIS — I5031 Acute diastolic (congestive) heart failure: Secondary | ICD-10-CM

## 2019-11-29 LAB — LIPID PANEL
Cholesterol: 155 mg/dL (ref 0–200)
HDL: 35 mg/dL — ABNORMAL LOW (ref >40)
LDL Cholesterol: 87 mg/dL (ref 0–99)
Total CHOL/HDL Ratio: 4.4 RATIO
Triglycerides: 165 mg/dL — ABNORMAL HIGH (ref <150)
VLDL: 33 mg/dL (ref 0–40)

## 2019-11-29 LAB — BASIC METABOLIC PANEL
Anion gap: 10 (ref 5–15)
BUN: 17 mg/dL (ref 8–23)
CO2: 31 mmol/L (ref 22–32)
Calcium: 10.4 mg/dL — ABNORMAL HIGH (ref 8.9–10.3)
Chloride: 100 mmol/L (ref 98–111)
Creatinine, Ser: 0.65 mg/dL (ref 0.44–1.00)
GFR calc Af Amer: >60 mL/min (ref >60)
GFR calc non Af Amer: >60 mL/min (ref >60)
Glucose, Bld: 110 mg/dL — ABNORMAL HIGH (ref 70–99)
Potassium: 4 mmol/L (ref 3.5–5.1)
Sodium: 141 mmol/L (ref 135–145)

## 2019-11-29 LAB — MAGNESIUM: Magnesium: 1.9 mg/dL (ref 1.7–2.4)

## 2019-11-29 LAB — SARS CORONAVIRUS 2 (TAT 6-24 HRS): SARS Coronavirus 2: NEGATIVE

## 2019-11-29 LAB — TROPONIN I (HIGH SENSITIVITY)
Troponin I (High Sensitivity): 415 ng/L (ref <18)
Troponin I (High Sensitivity): 336 ng/L (ref <18)

## 2019-11-29 NOTE — Progress Notes (Signed)
*  PRELIMINARY RESULTS* Echocardiogram 2D Echocardiogram has been performed.  [name] [name] 11/29/2019, 12:27 PM

## 2019-11-29 NOTE — Progress Notes (Signed)
Progress Note  Patient Name: [name] [name] Date of Encounter: 11/29/2019  Primary Cardiologist: Kathlyn Sacramento, MD   Subjective   No chest pain. Dyspnea improved. Not much change in weight.   Inpatient Medications    Scheduled Meds: . aspirin EC  81 mg Oral Daily  . atorvastatin  40 mg Oral q1800  . carvedilol  6.25 mg Oral BID WC  . enoxaparin (LOVENOX) injection  40 mg Subcutaneous Q24H  . fenofibrate  160 mg Oral Daily  . FLUoxetine  20 mg Oral Daily  . furosemide  40 mg Intravenous Q12H  . losartan  100 mg Oral Daily  . sodium chloride flush  3 mL Intravenous Q12H   Continuous Infusions: . sodium chloride     PRN Meds: sodium chloride, acetaminophen, ondansetron (ZOFRAN) IV, sodium chloride flush, tiZANidine   Vital Signs    Vitals:   11/29/19 0337 11/29/19 0907 11/29/19 1252 11/29/19 1257  BP: 118/76 113/73  101/65  Pulse: 71 75 76 77  Resp:  16  14  Temp: 97.6 F (36.4 C) 97.9 F (36.6 C)  97.7 F (36.5 C)  TempSrc: Oral Oral  Oral  SpO2: 93% 94% 93% 93%  Weight: 93.5 kg     Height:        Intake/Output Summary (Last 24 hours) at 11/29/2019 1409 Last data filed at 11/29/2019 0426 Gross per 24 hour  Intake --  Output 1650 ml  Net -1650 ml   Filed Weights   11/28/19 0934 11/29/19 0337  Weight: 93.9 kg 93.5 kg    Telemetry    nsr - Personally Reviewed  ECG    none - Personally Reviewed  Physical Exam   GEN: No acute distress.   Neck: 7 cm JVD Cardiac: RRR, no murmurs, rubs, or gallops.  Respiratory: Clear to auscultation bilaterally. GI: Soft, nontender, non-distended  MS: No edema; No deformity. Neuro:  Nonfocal  Psych: Normal affect   Labs    Chemistry Recent Labs  Lab 11/28/19 0927 11/29/19 0558  NA 140 141  K 3.8 4.0  CL 103 100  CO2 26 31  GLUCOSE 129* 110*  BUN 13 17  CREATININE 0.64 0.65  CALCIUM 9.1 10.4*  PROT 7.7  --   ALBUMIN 4.2  --   AST 42*  --   ALT 44  --   ALKPHOS 81  --   BILITOT 0.8  --    GFRNONAA >60 >60  GFRAA >60 >60  ANIONGAP 11 10     Hematology Recent Labs  Lab 11/28/19 0927  WBC 10.6*  RBC 4.90  HGB 14.1  HCT 44.4  MCV 90.6  MCH 28.8  MCHC 31.8  RDW 14.1  PLT 395    Cardiac EnzymesNo results for input(s): TROPONINI in the last 168 hours. No results for input(s): TROPIPOC in the last 168 hours.   BNP Recent Labs  Lab 11/28/19 0927  BNP 188.0*     DDimer No results for input(s): DDIMER in the last 168 hours.   Radiology    DG Chest Portable 1 View  Result Date: 11/28/2019 CLINICAL DATA:  Shortness of breath for 2 weeks EXAM: PORTABLE CHEST 1 VIEW COMPARISON:  CT chest 10/05/2016 FINDINGS: There is bilateral interstitial and patchy alveolar airspace opacities. There is no pleural effusion or pneumothorax. There is stable cardiomegaly. There is no acute osseous abnormality. IMPRESSION: Bilateral interstitial and patchy alveolar airspace opacities concerning for pulmonary edema versus multilobar pneumonia. Electronically Signed   By: Elbert Ewings  Patel   On: 11/28/2019 09:48    Cardiac Studies   none  Patient Profile     69 y.o. female obese, admitted with acute diastolic heart failure.  Assessment & Plan    1. Acute diastolic heart failure - continue IV lasix. Her symptoms but not her weight are improved. Continue IV diuresis and follow renal function. Her echo is pending. 2. HTN - her bp is controlled.      For questions or updates, please contact [place] Please consult [email or web address] for contact info under Cardiology/STEMI.   Signed, Cristopher Peru, MD  11/29/2019, 2:09 PM  Patient ID: Skipper Cliche, female   DOB: [birth year], 69 y.o.   MRN: [id number]

## 2019-11-29 NOTE — Plan of Care (Signed)
Pt diuresing well. Denies SOB, CP, N/V/D.  No peripheral edema noted.   Problem: Education: Goal: Knowledge of General Education information will improve Description: Including pain rating scale, medication(s)/side effects and non-pharmacologic comfort measures Outcome: Progressing   Problem: Health Behavior/Discharge Planning: Goal: Ability to manage health-related needs will improve Outcome: Progressing   Problem: Clinical Measurements: Goal: Ability to maintain clinical measurements within normal limits will improve Outcome: Progressing Goal: Will remain free from infection Outcome: Progressing Goal: Diagnostic test results will improve Outcome: Progressing Goal: Respiratory complications will improve Outcome: Progressing Goal: Cardiovascular complication will be avoided Outcome: Progressing   Problem: Activity: Goal: Risk for activity intolerance will decrease Outcome: Progressing   Problem: Nutrition: Goal: Adequate nutrition will be maintained Outcome: Progressing   Problem: Coping: Goal: Level of anxiety will decrease Outcome: Progressing   Problem: Elimination: Goal: Will not experience complications related to bowel motility Outcome: Progressing Goal: Will not experience complications related to urinary retention Outcome: Progressing   Problem: Pain Managment: Goal: General experience of comfort will improve Outcome: Progressing   Problem: Safety: Goal: Ability to remain free from injury will improve Outcome: Progressing   Problem: Skin Integrity: Goal: Risk for impaired skin integrity will decrease Outcome: Progressing

## 2019-11-29 NOTE — Progress Notes (Signed)
PROGRESS NOTE    [name] [name]  [id number] DOB: November 27, 1950 DOA: 11/28/2019 PCP: Owens Loffler, MD    Assessment & Plan:   Principal Problem:   Acute on chronic diastolic CHF (congestive heart failure) ([place]) Active Problems:   Hypertensive urgency   Elevated troponin   Depression   Acute lower UTI   Acute on chronic respiratory failure with hypoxia (HCC)   CHF (congestive heart failure), NYHA class I, acute on chronic, diastolic ([place])    [name] [name] is a 69 y.o. Caucasian female with medical history significant for chronic diastolic dysfunction CHF, coronary artery disease, hypertension and dyslipidemia who presents to the emergency room for evaluation of shortness of breath that has progressively worsened over the last 2 weeks.  Initially her shortness of breath was with exertion, but now she is short of breath at rest.  Shortness of breath is associated with a nonproductive cough.  She denies having any leg swelling.   Acute hypoxic respiratory failure 2/2 Acute on chronic diastolic dysfunction CHF --severe dyspnea, had to be on BiPAP initially, and now down to 2L [place]. --Patient's last 2D echocardiogram showed a normal LVEF (From 2014) --Cardiology consulted PLAN: --continue Lasix 40 mg IV every 12 --Continue losartan and coreg --2D echocardiogram  Hypertensive urgency Likely secondary to medication noncompliance --continue losartan and coreg --IV diuresis  Elevated troponin likely due to demand ischemia from CHF exacerbation --Trop peaked at 415.  No chest pain.  UTI, ruled out Patient has pyuria but no urinary symptoms at all. --d/c abx   DVT prophylaxis: Lovenox SQ Code Status: Full code  Family Communication:  Disposition Plan: Home in 1-2 days after back to room air.  Still on IV diuresis and 2L .     Subjective and Interval History:  Pt reported dyspnea much improved.  Had great urine output with diuresis.  No dysuria, no swelling.  No  fever, chest pain, abdominal pain, N/V/D.    Objective: Vitals:   11/29/19 0907 11/29/19 1252 11/29/19 1257 11/29/19 1624  BP: 113/73  101/65 (!) 118/59  Pulse: 75 76 77 77  Resp: 16  14 16   Temp: 97.9 F (36.6 C)  97.7 F (36.5 C) 98.3 F (36.8 C)  TempSrc: Oral  Oral Oral  SpO2: 94% 93% 93% 92%  Weight:      Height:        Intake/Output Summary (Last 24 hours) at 11/29/2019 1835 Last data filed at 11/29/2019 1828 Gross per 24 hour  Intake 500 ml  Output 1950 ml  Net -1450 ml   Filed Weights   11/28/19 0934 11/29/19 0337  Weight: 93.9 kg 93.5 kg    Examination:   Constitutional: NAD, AAOx3 HEENT: conjunctivae and lids normal, EOMI CV: RRR no M,R,G. Distal pulses +2.  No cyanosis.   RESP: crackles at posterior bases, normal respiratory effort, on 2L GI: +BS, NTND Extremities: No effusions, edema, or tenderness in BLE MSK: no joint enlargement or tenderness of both UE and LE SKIN: warm, dry and intact Neuro: II - XII grossly intact.  Sensation intact Psych: Normal mood and affect.  Appropriate judgement and reason   Data Reviewed: I have personally reviewed following labs and imaging studies  CBC: Recent Labs  Lab 11/28/19 0927  WBC 10.6*  NEUTROABS 8.1*  HGB 14.1  HCT 44.4  MCV 90.6  PLT [id number]   Basic Metabolic Panel: Recent Labs  Lab 11/28/19 0927 11/29/19 0558 11/29/19 0909  NA 140 141  --  K 3.8 4.0  --   CL 103 100  --   CO2 26 31  --   GLUCOSE 129* 110*  --   BUN 13 17  --   CREATININE 0.64 0.65  --   CALCIUM 9.1 10.4*  --   MG  --   --  1.9   GFR: Estimated Creatinine Clearance: 74.6 mL/min (by C-G formula based on SCr of 0.65 mg/dL). Liver Function Tests: Recent Labs  Lab 11/28/19 0927  AST 42*  ALT 44  ALKPHOS 81  BILITOT 0.8  PROT 7.7  ALBUMIN 4.2   No results for input(s): LIPASE, AMYLASE in the last 168 hours. No results for input(s): AMMONIA in the last 168 hours. Coagulation Profile: No results for input(s): INR,  PROTIME in the last 168 hours. Cardiac Enzymes: No results for input(s): CKTOTAL, CKMB, CKMBINDEX, TROPONINI in the last 168 hours. BNP (last 3 results) No results for input(s): PROBNP in the last 8760 hours. HbA1C: No results for input(s): HGBA1C in the last 72 hours. CBG: No results for input(s): GLUCAP in the last 168 hours. Lipid Profile: Recent Labs    11/29/19 0558  CHOL 155  HDL 35*  LDLCALC 87  TRIG 165*  CHOLHDL 4.4   Thyroid Function Tests: No results for input(s): TSH, T4TOTAL, FREET4, T3FREE, THYROIDAB in the last 72 hours. Anemia Panel: No results for input(s): VITAMINB12, FOLATE, FERRITIN, TIBC, IRON, RETICCTPCT in the last 72 hours. Sepsis Labs: No results for input(s): PROCALCITON, LATICACIDVEN in the last 168 hours.  Recent Results (from the past 240 hour(s))  SARS CORONAVIRUS 2 (TAT 6-24 HRS) Nasopharyngeal Nasopharyngeal Swab     Status: None   Collection Time: 11/28/19  9:29 PM   Specimen: Nasopharyngeal Swab  Result Value Ref Range Status   SARS Coronavirus 2 NEGATIVE NEGATIVE Final    Comment: (NOTE) SARS-CoV-2 target nucleic acids are NOT DETECTED. The SARS-CoV-2 RNA is generally detectable in upper and lower respiratory specimens during the acute phase of infection. Negative results do not preclude SARS-CoV-2 infection, do not rule out co-infections with other pathogens, and should not be used as the sole basis for treatment or other patient management decisions. Negative results must be combined with clinical observations, patient history, and epidemiological information. The expected result is Negative. Fact Sheet for Patients: SugarRoll.be Fact Sheet for Healthcare Providers: https://www.woods-mathews.com/ This test is not yet approved or cleared by the [place] FDA and  has been authorized for detection and/or diagnosis of SARS-CoV-2 by FDA under an Emergency Use Authorization (EUA). This EUA will  remain  in effect (meaning this test can be used) for the duration of the COVID-19 declaration under Section 56 4(b)(1) of the Act, 21 U.S.C. section 360bbb-3(b)(1), unless the authorization is terminated or revoked sooner. Performed at [facility], [place] [address], [place], [place] [address]       Radiology Studies: DG Chest Portable 1 View  Result Date: 11/28/2019 CLINICAL DATA:  Shortness of breath for 2 weeks EXAM: PORTABLE CHEST 1 VIEW COMPARISON:  CT chest 10/05/2016 FINDINGS: There is bilateral interstitial and patchy alveolar airspace opacities. There is no pleural effusion or pneumothorax. There is stable cardiomegaly. There is no acute osseous abnormality. IMPRESSION: Bilateral interstitial and patchy alveolar airspace opacities concerning for pulmonary edema versus multilobar pneumonia. Electronically Signed   By: Kathreen Devoid   On: 11/28/2019 09:48     Scheduled Meds: . aspirin EC  81 mg Oral Daily  . atorvastatin  40 mg Oral  q1800  . carvedilol  6.25 mg Oral BID WC  . enoxaparin (LOVENOX) injection  40 mg Subcutaneous Q24H  . fenofibrate  160 mg Oral Daily  . FLUoxetine  20 mg Oral Daily  . furosemide  40 mg Intravenous Q12H  . losartan  100 mg Oral Daily  . sodium chloride flush  3 mL Intravenous Q12H   Continuous Infusions: . sodium chloride       LOS: 1 day     Enzo Bi, MD [facility] If 7PM-7AM, please contact night-coverage 11/29/2019, 6:35 PM

## 2019-11-30 LAB — CBC
HCT: 43.1 % (ref 36.0–46.0)
Hemoglobin: 13.9 g/dL (ref 12.0–15.0)
MCH: 28.7 pg (ref 26.0–34.0)
MCHC: 32.3 g/dL (ref 30.0–36.0)
MCV: 88.9 fL (ref 80.0–100.0)
Platelets: 422 K/uL — ABNORMAL HIGH (ref 150–400)
RBC: 4.85 MIL/uL (ref 3.87–5.11)
RDW: 13.8 % (ref 11.5–15.5)
WBC: 12.2 K/uL — ABNORMAL HIGH (ref 4.0–10.5)
nRBC: 0 % (ref 0.0–0.2)

## 2019-11-30 LAB — MAGNESIUM: Magnesium: 1.7 mg/dL (ref 1.7–2.4)

## 2019-11-30 LAB — ECHOCARDIOGRAM COMPLETE
Height: 64 in
Weight: 3297.6 oz

## 2019-11-30 LAB — BASIC METABOLIC PANEL WITH GFR
Anion gap: 15 (ref 5–15)
BUN: 19 mg/dL (ref 8–23)
CO2: 30 mmol/L (ref 22–32)
Calcium: 9.6 mg/dL (ref 8.9–10.3)
Chloride: 95 mmol/L — ABNORMAL LOW (ref 98–111)
Creatinine, Ser: 0.64 mg/dL (ref 0.44–1.00)
GFR calc Af Amer: >60 mL/min
GFR calc non Af Amer: >60 mL/min
Glucose, Bld: 122 mg/dL — ABNORMAL HIGH (ref 70–99)
Potassium: 4.1 mmol/L (ref 3.5–5.1)
Sodium: 140 mmol/L (ref 135–145)

## 2019-11-30 MED ORDER — FUROSEMIDE 10 MG/ML IJ SOLN
40.0000 mg | Freq: Two times a day (BID) | INTRAMUSCULAR | Status: DC
  Administered 2019-11-30: 40 mg via INTRAVENOUS
  Filled 2019-11-30: qty 4

## 2019-11-30 MED ORDER — FUROSEMIDE 10 MG/ML IJ SOLN
40.0000 mg | Freq: Two times a day (BID) | INTRAMUSCULAR | Status: DC
  Administered 2019-11-30 – 2019-12-01 (×2): 40 mg via INTRAVENOUS
  Filled 2019-11-30 (×2): qty 4

## 2019-11-30 NOTE — TOC Progression Note (Signed)
Transition of Care [facility]) - Progression Note    Patient Details  Name: [name] [name] MRN: [id number] Date of Birth: Apr 18, 1951  Transition of Care [facility]) CM/SW [place], [place] Phone Number: [phone] 11/30/2019, 3:45 PM  Clinical Narrative:    Patient will be seen for home health w/ Corene Cornea at [facility] [phone].   Expected Discharge Plan: [place] Barriers to Discharge: No Barriers Identified  Expected Discharge Plan and Services Expected Discharge Plan: [place] In-house Referral: Clinical Social Work   Post Acute Care Choice: [place] arrangements for the past 2 months: Single Family Home                                       Social Determinants of Health (SDOH) Interventions    Readmission Risk Interventions No flowsheet data found.

## 2019-11-30 NOTE — Progress Notes (Signed)
Progress Note  Patient Name: [name] [name] Date of Encounter: 11/30/2019  Primary Cardiologist: Kathlyn Sacramento, MD   Subjective   No chest pain or sob.   Inpatient Medications    Scheduled Meds: . aspirin EC  81 mg Oral Daily  . atorvastatin  40 mg Oral q1800  . carvedilol  6.25 mg Oral BID WC  . enoxaparin (LOVENOX) injection  40 mg Subcutaneous Q24H  . fenofibrate  160 mg Oral Daily  . FLUoxetine  20 mg Oral Daily  . furosemide  40 mg Intravenous BID  . losartan  100 mg Oral Daily  . sodium chloride flush  3 mL Intravenous Q12H   Continuous Infusions: . sodium chloride     PRN Meds: sodium chloride, acetaminophen, ondansetron (ZOFRAN) IV, sodium chloride flush, tiZANidine   Vital Signs    Vitals:   11/30/19 0013 11/30/19 0459 11/30/19 0500 11/30/19 0802  BP: 116/77  117/63 125/76  Pulse: 85  87 82  Resp:   20 17  Temp: 98.4 F (36.9 C)  98 F (36.7 C) 97.7 F (36.5 C)  TempSrc: Oral     SpO2: 95%  94% 93%  Weight:  92.9 kg    Height:        Intake/Output Summary (Last 24 hours) at 11/30/2019 1113 Last data filed at 11/30/2019 0801 Gross per 24 hour  Intake --  Output 1400 ml  Net -1400 ml   Filed Weights   11/28/19 0934 11/29/19 0337 11/30/19 0459  Weight: 93.9 kg 93.5 kg 92.9 kg    Telemetry    nsr - Personally Reviewed  ECG    none - Personally Reviewed  Physical Exam   GEN: No acute distress.   Neck: No JVD Cardiac: RRR, no murmurs, rubs, or gallops.  Respiratory: Clear to auscultation bilaterally. GI: Soft, nontender, non-distended  MS: No edema; No deformity. Neuro:  Nonfocal  Psych: Normal affect   Labs    Chemistry Recent Labs  Lab 11/28/19 0927 11/29/19 0558 11/30/19 0438  NA 140 141 140  K 3.8 4.0 4.1  CL 103 100 95*  CO2 26 31 30   GLUCOSE 129* 110* 122*  BUN 13 17 19   CREATININE 0.64 0.65 0.64  CALCIUM 9.1 10.4* 9.6  PROT 7.7  --   --   ALBUMIN 4.2  --   --   AST 42*  --   --   ALT 44  --   --   ALKPHOS  81  --   --   BILITOT 0.8  --   --   GFRNONAA >60 >60 >60  GFRAA >60 >60 >60  ANIONGAP 11 10 15      Hematology Recent Labs  Lab 11/28/19 0927 11/30/19 0438  WBC 10.6* 12.2*  RBC 4.90 4.85  HGB 14.1 13.9  HCT 44.4 43.1  MCV 90.6 88.9  MCH 28.8 28.7  MCHC 31.8 32.3  RDW 14.1 13.8  PLT 395 422*    Cardiac EnzymesNo results for input(s): TROPONINI in the last 168 hours. No results for input(s): TROPIPOC in the last 168 hours.   BNP Recent Labs  Lab 11/28/19 0927  BNP 188.0*     DDimer No results for input(s): DDIMER in the last 168 hours.   Radiology    ECHOCARDIOGRAM COMPLETE  Result Date: 11/30/2019    ECHOCARDIOGRAM REPORT   Patient Name:   [name] [name] Date of Exam: 11/29/2019 Medical Rec #:  [id number]       Height:  64.0 in Accession #:    [id number]      Weight:       206.1 lb Date of Birth:  February 19, 1951      BSA:          1.982 m Patient Age:    69 years        BP:           113/73 mmHg Patient Gender: F               HR:           79 bpm. Exam Location:  [facility] Procedure: 2D Echo Indications:     CHF 428.31/ I50.31  History:         Patient has no prior history of Echocardiogram examinations.  Sonographer:     Arville Go RDCS Referring Phys:  [id number] AGBATA Diagnosing Phys: Ida Rogue MD IMPRESSIONS  1. Left ventricular ejection fraction, by estimation, is 25 to 30%. The left ventricle has severely decreased function. The left ventricle demonstrates global hypokinesis. The left ventricular internal cavity size was mildly dilated. There is mild left ventricular hypertrophy. Left ventricular diastolic parameters are consistent with Grade I diastolic dysfunction (impaired relaxation).  2. Right ventricular systolic function is normal. The right ventricular size is normal. Tricuspid regurgitation signal is inadequate for assessing PA pressure.  3. Left atrial size was mildly dilated.  4. The inferior vena cava is normal in size with greater than 50%  respiratory variability, suggesting right atrial pressure of 3 mmHg. FINDINGS  Left Ventricle: Left ventricular ejection fraction, by estimation, is 25 to 30%. The left ventricle has severely decreased function. The left ventricle demonstrates global hypokinesis. The left ventricular internal cavity size was mildly dilated. There is mild left ventricular hypertrophy. Left ventricular diastolic parameters are consistent with Grade I diastolic dysfunction (impaired relaxation). Right Ventricle: The right ventricular size is normal. No increase in right ventricular wall thickness. Right ventricular systolic function is normal. Tricuspid regurgitation signal is inadequate for assessing PA pressure. Left Atrium: Left atrial size was mildly dilated. Right Atrium: Right atrial size was normal in size. Pericardium: There is no evidence of pericardial effusion. Mitral Valve: The mitral valve is normal in structure. Normal mobility of the mitral valve leaflets. Mild mitral valve regurgitation. No evidence of mitral valve stenosis. Tricuspid Valve: The tricuspid valve is normal in structure. Tricuspid valve regurgitation is not demonstrated. No evidence of tricuspid stenosis. Aortic Valve: The aortic valve is normal in structure. Aortic valve regurgitation is not visualized. No aortic stenosis is present. Aortic valve mean gradient measures 5.0 mmHg. Aortic valve peak gradient measures 9.7 mmHg. Aortic valve area, by VTI measures 1.64 cm. Pulmonic Valve: The pulmonic valve was normal in structure. Pulmonic valve regurgitation is not visualized. No evidence of pulmonic stenosis. Aorta: The aortic root is normal in size and structure. Venous: The inferior vena cava is normal in size with greater than 50% respiratory variability, suggesting right atrial pressure of 3 mmHg. IAS/Shunts: No atrial level shunt detected by color flow Doppler.  LEFT VENTRICLE PLAX 2D LVIDd:         6.14 cm      Diastology LVIDs:         5.45 cm       LV e' medial:   3.37 cm/s LV PW:         1.22 cm      LV E/e' medial: 17.0 LV IVS:        1.39  cm LVOT diam:     2.00 cm LV SV:         46 LV SV Index:   23 LVOT Area:     3.14 cm  LV Volumes (MOD) LV vol d, MOD A2C: 188.0 ml LV vol d, MOD A4C: 148.0 ml LV vol s, MOD A2C: 126.0 ml LV vol s, MOD A4C: 94.6 ml LV SV MOD A2C:     62.0 ml LV SV MOD A4C:     148.0 ml LV SV MOD BP:      67.5 ml RIGHT VENTRICLE RV Basal diam:  1.81 cm RV S prime:     14.40 cm/s TAPSE (M-mode): 1.7 cm LEFT ATRIUM             Index       RIGHT ATRIUM          Index LA diam:        4.60 cm 2.32 cm/m  RA Area:     9.17 cm LA Vol (A2C):   47.1 ml 23.77 ml/m RA Volume:   16.50 ml 8.33 ml/m LA Vol (A4C):   23.8 ml 12.01 ml/m LA Biplane Vol: 35.4 ml 17.87 ml/m  AORTIC VALVE                    PULMONIC VALVE AV Area (Vmax):    1.76 cm     PV Vmax:       0.95 m/s AV Area (Vmean):   1.60 cm     PV Peak grad:  3.6 mmHg AV Area (VTI):     1.64 cm AV Vmax:           156.00 cm/s AV Vmean:          105.000 cm/s AV VTI:            0.282 m AV Peak Grad:      9.7 mmHg AV Mean Grad:      5.0 mmHg LVOT Vmax:         87.20 cm/s LVOT Vmean:        53.600 cm/s LVOT VTI:          0.147 m LVOT/AV VTI ratio: 0.52  AORTA Ao Root diam: 2.90 cm Ao Asc diam:  3.40 cm MITRAL VALVE MV Area (PHT): 3.91 cm    SHUNTS MV Decel Time: 194 msec    Systemic VTI:  0.15 m MV E velocity: 57.20 cm/s  Systemic Diam: 2.00 cm MV A velocity: 86.30 cm/s MV E/A ratio:  0.66 Ida Rogue MD Electronically signed by Ida Rogue MD Signature Date/Time: 11/30/2019/8:50:21 AM    Final     Cardiac Studies   2D echo - see above. Reviewed. Her EF is down in the 25% range.  Patient Profile     69 y.o. female admitted with acute on chronic heart failure and found to have new LV dysfunction  Assessment & Plan    1. Acute systolic heart failure - her EF is down by echo. I have asked her to continue her guideline directed medical therapy. Consider switching to [facility]. 2.  Obesity - she is encouraged to lose weight. 3. HTN - her bp is well controlled. 4. Disp. - she could be discharged home tomorrow morning.     For questions or updates, please contact [place] Please consult [email or web address] for contact info under Cardiology/STEMI.      Signed, Cristopher Peru, MD  11/30/2019, 11:13 AM  Patient ID: [name] Nevin  [name], female   DOB: Feb 05, 1951, 69 y.o.   MRN: [id number]

## 2019-11-30 NOTE — TOC Initial Note (Signed)
Transition of Care [facility]) - Initial/Assessment Note    Patient Details  Name: [name] [name] MRN: [id number] Date of Birth: 1951/09/11  Transition of Care [facility]) CM/SW Contact:    Ova Freshwater Phone Number: [phone] 11/30/2019, 10:20 AM  Clinical Narrative:                 Patient is at [facility] due to shortness of breath.  Patient is oriented x4.  This SW explained to patient the MD suggested SNF placement.  Patient stated she would like to stay home and does not want to go to SNF, "if I they can take care of me the same way at home."  Patient stated "I have plenty of help at home.  Patient lives at home with family friend and his family, sister's home is on the same drive way and brother and niece live down the block. Patient stated she can manage ADLs and stated "the only thing I don't do is clean because I get out of breath." Patient has a walker, cane and wheelchair at home, is continent and is able to bath on her own.   Expected Discharge Plan: Skilled Nursing Facility Barriers to Discharge: No Barriers Identified   Patient Goals and CMS Choice Patient states their goals for this hospitalization and ongoing recovery are:: 'I would like to have home health.  I have plenty of help at home."      Expected Discharge Plan and Services Expected Discharge Plan: [place] In-house Referral: Clinical Social Work   Post Acute Care Choice: [place] arrangements for the past 2 months: [place]                                      Prior Living Arrangements/Services Living arrangements for the past 2 months: Single Family Home Lives with:: Friends Patient language and need for interpreter reviewed:: Yes Do you feel safe going back to the place where you live?: Yes      Need for Family Participation in Patient Care: Yes (Comment) Care giver support system in place?: Yes (comment)   Criminal Activity/Legal Involvement Pertinent to  Current Situation/Hospitalization: No - Comment as needed  Activities of Daily Living Home Assistive Devices/Equipment: None ADL Screening (condition at time of admission) Patient's cognitive ability adequate to safely complete daily activities?: Yes Is the patient deaf or have difficulty hearing?: Yes Does the patient have difficulty seeing, even when wearing glasses/contacts?: Yes Does the patient have difficulty concentrating, remembering, or making decisions?: No Patient able to express need for assistance with ADLs?: No Does the patient have difficulty dressing or bathing?: No Independently performs ADLs?: Yes (appropriate for developmental age) Does the patient have difficulty walking or climbing stairs?: No Weakness of Legs: None Weakness of Arms/Hands: None  Permission Sought/Granted Permission sought to share information with : Family Supports    Share Information with NAME: Acquanetta Belling     Permission granted to share info w Relationship: sister  Permission granted to share info w Contact Information: [phone]  Emotional Assessment Appearance:: Appears stated age Attitude/Demeanor/Rapport: Engaged Affect (typically observed): Accepting, Calm, Pleasant Orientation: : Oriented to Self, Oriented to Place, Oriented to  Time, Oriented to Situation Alcohol / Substance Use: Not Applicable Psych Involvement: No (comment)  Admission diagnosis:  Acute on chronic diastolic congestive heart failure (HCC) [I50.33] Acute respiratory failure with hypoxia (HCC) [J96.01] CHF (congestive heart  failure), NYHA class I, acute on chronic, diastolic (HCC) 123456 Patient Active Problem List   Diagnosis Date Noted  . Hypertensive urgency 11/28/2019  . Elevated troponin 11/28/2019  . Depression 11/28/2019  . Acute lower UTI 11/28/2019  . Acute on chronic respiratory failure with hypoxia ([place]) 11/28/2019  . CHF (congestive heart failure), NYHA class I, acute on chronic, diastolic ([place])  [id number]  . Acute left-sided low back pain 11/05/2019  . Vertigo 03/17/2018  . Acute on chronic diastolic CHF (congestive heart failure) ([place]) 06/18/2013  . Coronary artery disease   . OTHER MALAISE AND FATIGUE 12/28/2008  . Hyperlipidemia LDL goal <70 09/28/2008  . Essential hypertension 09/28/2008  . ALLERGIC RHINITIS 09/28/2008  . OSTEOARTHRITIS 09/28/2008  . DCIS (ductal carcinoma in situ), LEFT, remission 09/28/2008   PCP:  Owens Loffler, MD Pharmacy:   [facility], [place] - [address], [id number], [place] [address] Phone: [phone] Fax: [phone]  [id number] - [place], [place] [address] Phone: [phone] Fax: [phone]  [place], [place] - [place] [address] Phone: [phone] Fax: [phone]     Social Determinants of Health (SDOH) Interventions    Readmission Risk Interventions No flowsheet data found.

## 2019-11-30 NOTE — Progress Notes (Signed)
RN assisted patient to bathroom & walking in room. Pt felt much better moving around and no complaints of SOB. Upon return to bed O2 was 98%. O2 turned down to 1L/m. Will recheck O2 in 3 hours to continue to wean to room air.

## 2019-11-30 NOTE — Progress Notes (Signed)
O2 at 88% after room air for 1 hour.  Placed patient back on 1L/m to bring O2 back to 94%.

## 2019-11-30 NOTE — Progress Notes (Signed)
PROGRESS NOTE    [name] [name]  [id number] DOB: Jul 08, 1951 DOA: 11/28/2019 PCP: Owens Loffler, MD    Assessment & Plan:   Principal Problem:   Acute on chronic diastolic CHF (congestive heart failure) ([place]) Active Problems:   Hypertensive urgency   Elevated troponin   Depression   Acute lower UTI   Acute on chronic respiratory failure with hypoxia (HCC)   CHF (congestive heart failure), NYHA class I, acute on chronic, diastolic ([place])    [name] [name] is a 69 y.o. Caucasian female with medical history significant for chronic diastolic dysfunction CHF, coronary artery disease, hypertension and dyslipidemia who presents to the emergency room for evaluation of shortness of breath that has progressively worsened over the last 2 weeks.  Initially her shortness of breath was with exertion, but now she is short of breath at rest.  Shortness of breath is associated with a nonproductive cough.  She denies having any leg swelling.   Acute hypoxic respiratory failure 2/2 Acute on chronic diastolic dysfunction CHF --severe dyspnea, had to be on BiPAP initially, then down to 2L [place], today down to 1L. --Patient's last 2D echocardiogram showed a normal LVEF (From 2014).  TTE during this admission showed severely reduced LVEF 25-30% with grade I diastolic dysfunction. --Cardiology consulted PLAN: --continue Lasix 40 mg IV every 12 --Continue losartan and coreg --continue to wean O2  Hypertensive urgency Likely secondary to medication noncompliance --continue losartan and coreg --IV diuresis  Elevated troponin likely due to demand ischemia from CHF exacerbation --Trop peaked at 415.  No chest pain.  UTI, ruled out Patient has pyuria but no urinary symptoms at all. --abx d/c'ed   DVT prophylaxis: Lovenox SQ Code Status: Full code  Family Communication:  Disposition Plan: Home likely tomorrow if able to be weaned down to RA.   Subjective and Interval History:  Dyspnea  continued to improve.  Continued to diurese well.  No fever, chest pain, abdominal pain, N/V/D, dysuria, increased swelling.  Had BM.   Objective: Vitals:   11/30/19 1141 11/30/19 1158 11/30/19 1327 11/30/19 1424  BP: 123/81     Pulse: 85  91 85  Resp: 19     Temp: 97.9 F (36.6 C)     TempSrc: Oral     SpO2: 97% 96% 94% 91%  Weight:      Height:        Intake/Output Summary (Last 24 hours) at 11/30/2019 1448 Last data filed at 11/30/2019 1155 Gross per 24 hour  Intake 3 ml  Output 1400 ml  Net -1397 ml   Filed Weights   11/28/19 0934 11/29/19 0337 11/30/19 0459  Weight: 93.9 kg 93.5 kg 92.9 kg    Examination:   Constitutional: NAD, AAOx3 HEENT: conjunctivae and lids normal, EOMI CV: RRR no M,R,G. Distal pulses +2.  No cyanosis.   RESP: clear, normal respiratory effort, on 1L GI: +BS, NTND Extremities: No effusions, edema, or tenderness in BLE MSK: no joint enlargement or tenderness of both UE and LE SKIN: warm, dry and intact Neuro: II - XII grossly intact.  Sensation intact Psych: Normal mood and affect.  Appropriate judgement and reason   Data Reviewed: I have personally reviewed following labs and imaging studies  CBC: Recent Labs  Lab 11/28/19 0927 11/30/19 0438  WBC 10.6* 12.2*  NEUTROABS 8.1*  --   HGB 14.1 13.9  HCT 44.4 43.1  MCV 90.6 88.9  PLT 395 [id number]*   Basic Metabolic Panel: Recent Labs  Lab 11/28/19  [id number] 11/29/19 0558 11/29/19 0909 11/30/19 0438  NA 140 141  --  140  K 3.8 4.0  --  4.1  CL 103 100  --  95*  CO2 26 31  --  30  GLUCOSE 129* 110*  --  122*  BUN 13 17  --  19  CREATININE 0.64 0.65  --  0.64  CALCIUM 9.1 10.4*  --  9.6  MG  --   --  1.9 1.7   GFR: Estimated Creatinine Clearance: 74.4 mL/min (by C-G formula based on SCr of 0.64 mg/dL). Liver Function Tests: Recent Labs  Lab 11/28/19 0927  AST 42*  ALT 44  ALKPHOS 81  BILITOT 0.8  PROT 7.7  ALBUMIN 4.2   No results for input(s): LIPASE, AMYLASE in the last 168  hours. No results for input(s): AMMONIA in the last 168 hours. Coagulation Profile: No results for input(s): INR, PROTIME in the last 168 hours. Cardiac Enzymes: No results for input(s): CKTOTAL, CKMB, CKMBINDEX, TROPONINI in the last 168 hours. BNP (last 3 results) No results for input(s): PROBNP in the last 8760 hours. HbA1C: No results for input(s): HGBA1C in the last 72 hours. CBG: No results for input(s): GLUCAP in the last 168 hours. Lipid Profile: Recent Labs    11/29/19 0558  CHOL 155  HDL 35*  LDLCALC 87  TRIG 165*  CHOLHDL 4.4   Thyroid Function Tests: No results for input(s): TSH, T4TOTAL, FREET4, T3FREE, THYROIDAB in the last 72 hours. Anemia Panel: No results for input(s): VITAMINB12, FOLATE, FERRITIN, TIBC, IRON, RETICCTPCT in the last 72 hours. Sepsis Labs: No results for input(s): PROCALCITON, LATICACIDVEN in the last 168 hours.  Recent Results (from the past 240 hour(s))  SARS CORONAVIRUS 2 (TAT 6-24 HRS) Nasopharyngeal Nasopharyngeal Swab     Status: None   Collection Time: 11/28/19  9:29 PM   Specimen: Nasopharyngeal Swab  Result Value Ref Range Status   SARS Coronavirus 2 NEGATIVE NEGATIVE Final    Comment: (NOTE) SARS-CoV-2 target nucleic acids are NOT DETECTED. The SARS-CoV-2 RNA is generally detectable in upper and lower respiratory specimens during the acute phase of infection. Negative results do not preclude SARS-CoV-2 infection, do not rule out co-infections with other pathogens, and should not be used as the sole basis for treatment or other patient management decisions. Negative results must be combined with clinical observations, patient history, and epidemiological information. The expected result is Negative. Fact Sheet for Patients: SugarRoll.be Fact Sheet for Healthcare Providers: https://www.woods-mathews.com/ This test is not yet approved or cleared by the [place] FDA and  has been  authorized for detection and/or diagnosis of SARS-CoV-2 by FDA under an Emergency Use Authorization (EUA). This EUA will remain  in effect (meaning this test can be used) for the duration of the COVID-19 declaration under Section 56 4(b)(1) of the Act, 21 U.S.C. section 360bbb-3(b)(1), unless the authorization is terminated or revoked sooner. Performed at [facility], [place] [address], [place], [place] [address]       Radiology Studies: ECHOCARDIOGRAM COMPLETE  Result Date: 11/30/2019    ECHOCARDIOGRAM REPORT   Patient Name:   [name] [name] Date of Exam: 11/29/2019 Medical Rec #:  [id number]       Height:       64.0 in Accession #:    [id number]      Weight:       206.1 lb Date of Birth:  09-Sep-1951      BSA:  1.982 m Patient Age:    67 years        BP:           113/73 mmHg Patient Gender: F               HR:           79 bpm. Exam Location:  [facility] Procedure: 2D Echo Indications:     CHF 428.31/ I50.31  History:         Patient has no prior history of Echocardiogram examinations.  Sonographer:     Arville Go RDCS Referring Phys:  [id number] AGBATA Diagnosing Phys: Ida Rogue MD IMPRESSIONS  1. Left ventricular ejection fraction, by estimation, is 25 to 30%. The left ventricle has severely decreased function. The left ventricle demonstrates global hypokinesis. The left ventricular internal cavity size was mildly dilated. There is mild left ventricular hypertrophy. Left ventricular diastolic parameters are consistent with Grade I diastolic dysfunction (impaired relaxation).  2. Right ventricular systolic function is normal. The right ventricular size is normal. Tricuspid regurgitation signal is inadequate for assessing PA pressure.  3. Left atrial size was mildly dilated.  4. The inferior vena cava is normal in size with greater than 50% respiratory variability, suggesting right atrial pressure of 3 mmHg. FINDINGS  Left Ventricle: Left ventricular ejection fraction, by  estimation, is 25 to 30%. The left ventricle has severely decreased function. The left ventricle demonstrates global hypokinesis. The left ventricular internal cavity size was mildly dilated. There is mild left ventricular hypertrophy. Left ventricular diastolic parameters are consistent with Grade I diastolic dysfunction (impaired relaxation). Right Ventricle: The right ventricular size is normal. No increase in right ventricular wall thickness. Right ventricular systolic function is normal. Tricuspid regurgitation signal is inadequate for assessing PA pressure. Left Atrium: Left atrial size was mildly dilated. Right Atrium: Right atrial size was normal in size. Pericardium: There is no evidence of pericardial effusion. Mitral Valve: The mitral valve is normal in structure. Normal mobility of the mitral valve leaflets. Mild mitral valve regurgitation. No evidence of mitral valve stenosis. Tricuspid Valve: The tricuspid valve is normal in structure. Tricuspid valve regurgitation is not demonstrated. No evidence of tricuspid stenosis. Aortic Valve: The aortic valve is normal in structure. Aortic valve regurgitation is not visualized. No aortic stenosis is present. Aortic valve mean gradient measures 5.0 mmHg. Aortic valve peak gradient measures 9.7 mmHg. Aortic valve area, by VTI measures 1.64 cm. Pulmonic Valve: The pulmonic valve was normal in structure. Pulmonic valve regurgitation is not visualized. No evidence of pulmonic stenosis. Aorta: The aortic root is normal in size and structure. Venous: The inferior vena cava is normal in size with greater than 50% respiratory variability, suggesting right atrial pressure of 3 mmHg. IAS/Shunts: No atrial level shunt detected by color flow Doppler.  LEFT VENTRICLE PLAX 2D LVIDd:         6.14 cm      Diastology LVIDs:         5.45 cm      LV e' medial:   3.37 cm/s LV PW:         1.22 cm      LV E/e' medial: 17.0 LV IVS:        1.39 cm LVOT diam:     2.00 cm LV SV:          46 LV SV Index:   23 LVOT Area:     3.14 cm  LV Volumes (MOD) LV vol d, MOD  A2C: 188.0 ml LV vol d, MOD A4C: 148.0 ml LV vol s, MOD A2C: 126.0 ml LV vol s, MOD A4C: 94.6 ml LV SV MOD A2C:     62.0 ml LV SV MOD A4C:     148.0 ml LV SV MOD BP:      67.5 ml RIGHT VENTRICLE RV Basal diam:  1.81 cm RV S prime:     14.40 cm/s TAPSE (M-mode): 1.7 cm LEFT ATRIUM             Index       RIGHT ATRIUM          Index LA diam:        4.60 cm 2.32 cm/m  RA Area:     9.17 cm LA Vol (A2C):   47.1 ml 23.77 ml/m RA Volume:   16.50 ml 8.33 ml/m LA Vol (A4C):   23.8 ml 12.01 ml/m LA Biplane Vol: 35.4 ml 17.87 ml/m  AORTIC VALVE                    PULMONIC VALVE AV Area (Vmax):    1.76 cm     PV Vmax:       0.95 m/s AV Area (Vmean):   1.60 cm     PV Peak grad:  3.6 mmHg AV Area (VTI):     1.64 cm AV Vmax:           156.00 cm/s AV Vmean:          105.000 cm/s AV VTI:            0.282 m AV Peak Grad:      9.7 mmHg AV Mean Grad:      5.0 mmHg LVOT Vmax:         87.20 cm/s LVOT Vmean:        53.600 cm/s LVOT VTI:          0.147 m LVOT/AV VTI ratio: 0.52  AORTA Ao Root diam: 2.90 cm Ao Asc diam:  3.40 cm MITRAL VALVE MV Area (PHT): 3.91 cm    SHUNTS MV Decel Time: 194 msec    Systemic VTI:  0.15 m MV E velocity: 57.20 cm/s  Systemic Diam: 2.00 cm MV A velocity: 86.30 cm/s MV E/A ratio:  0.66 Ida Rogue MD Electronically signed by Ida Rogue MD Signature Date/Time: 11/30/2019/8:50:21 AM    Final      Scheduled Meds: . aspirin EC  81 mg Oral Daily  . atorvastatin  40 mg Oral q1800  . carvedilol  6.25 mg Oral BID WC  . enoxaparin (LOVENOX) injection  40 mg Subcutaneous Q24H  . fenofibrate  160 mg Oral Daily  . FLUoxetine  20 mg Oral Daily  . furosemide  40 mg Intravenous BID  . losartan  100 mg Oral Daily  . sodium chloride flush  3 mL Intravenous Q12H   Continuous Infusions: . sodium chloride       LOS: 2 days     Enzo Bi, MD [facility] If 7PM-7AM, please contact night-coverage 11/30/2019,  2:48 PM

## 2019-12-01 DIAGNOSIS — I5021 Acute systolic (congestive) heart failure: Secondary | ICD-10-CM

## 2019-12-01 LAB — CBC
HCT: 41.7 % (ref 36.0–46.0)
Hemoglobin: 13.4 g/dL (ref 12.0–15.0)
MCH: 28.6 pg (ref 26.0–34.0)
MCHC: 32.1 g/dL (ref 30.0–36.0)
MCV: 89.1 fL (ref 80.0–100.0)
Platelets: 416 10*3/uL — ABNORMAL HIGH (ref 150–400)
RBC: 4.68 MIL/uL (ref 3.87–5.11)
RDW: 14 % (ref 11.5–15.5)
WBC: 11.3 10*3/uL — ABNORMAL HIGH (ref 4.0–10.5)
nRBC: 0 % (ref 0.0–0.2)

## 2019-12-01 LAB — BASIC METABOLIC PANEL
Anion gap: 11 (ref 5–15)
BUN: 34 mg/dL — ABNORMAL HIGH (ref 8–23)
CO2: 31 mmol/L (ref 22–32)
Calcium: 9.6 mg/dL (ref 8.9–10.3)
Chloride: 95 mmol/L — ABNORMAL LOW (ref 98–111)
Creatinine, Ser: 0.74 mg/dL (ref 0.44–1.00)
GFR calc Af Amer: >60 mL/min (ref >60)
GFR calc non Af Amer: >60 mL/min (ref >60)
Glucose, Bld: 114 mg/dL — ABNORMAL HIGH (ref 70–99)
Potassium: 4.3 mmol/L (ref 3.5–5.1)
Sodium: 137 mmol/L (ref 135–145)

## 2019-12-01 LAB — MAGNESIUM: Magnesium: 2 mg/dL (ref 1.7–2.4)

## 2019-12-01 MED ORDER — FUROSEMIDE 40 MG PO TABS: 40.0000 mg | ORAL_TABLET | Freq: Every day | ORAL | Status: DC

## 2019-12-01 MED ORDER — DICLOFENAC SODIUM 1 % EX GEL
2.0000 g | Freq: Four times a day (QID) | CUTANEOUS | Status: DC | PRN
  Filled 2019-12-01: qty 100

## 2019-12-01 NOTE — Progress Notes (Signed)
SATURATION QUALIFICATIONS: (This note is used to comply with regulatory documentation for home oxygen)  Patient Saturations on Room Air at Rest = 98%  Patient Saturations on Room Air while Ambulating = 94%  Patient Saturations on 0 Liters of oxygen while Ambulating = 94%  Please briefly explain why patient needs home oxygen:   No home oxygen requirement evident.

## 2019-12-01 NOTE — Progress Notes (Signed)
Progress Note  Patient Name: [name] [name] Date of Encounter: 12/01/2019  Primary Cardiologist: Fletcher Anon  Subjective   Dyspnea resolved. Now on room air. No symptoms with ambulation. No chest pain or palpitations. Documented UOP - 3.9 L for the admission. Weight 92.9-->92.5 kg. Echo over the weekend showed a new CM with an EF of 25-30% (prior low normal). Tolerating Coreg and losartan with BP in the 90s to low [id number] systolic.    Inpatient Medications    Scheduled Meds: . aspirin EC  81 mg Oral Daily  . atorvastatin  40 mg Oral q1800  . carvedilol  6.25 mg Oral BID WC  . enoxaparin (LOVENOX) injection  40 mg Subcutaneous Q24H  . fenofibrate  160 mg Oral Daily  . FLUoxetine  20 mg Oral Daily  . furosemide  40 mg Intravenous BID  . losartan  100 mg Oral Daily  . sodium chloride flush  3 mL Intravenous Q12H   Continuous Infusions: . sodium chloride     PRN Meds: sodium chloride, acetaminophen, ondansetron (ZOFRAN) IV, sodium chloride flush, tiZANidine   Vital Signs    Vitals:   11/30/19 1841 11/30/19 1959 12/01/19 0251 12/01/19 0749  BP: 95/65 96/63 (!) 107/53 (!) 118/55  Pulse: 84 82 77 73  Resp: 20 20 20 18   Temp:  98.1 F (36.7 C) 97.7 F (36.5 C) 97.6 F (36.4 C)  TempSrc:  Oral Oral Oral  SpO2:  96% 95% 92%  Weight:   92.5 kg   Height:        Intake/Output Summary (Last 24 hours) at 12/01/2019 1044 Last data filed at 12/01/2019 0253 Gross per 24 hour  Intake 3 ml  Output 450 ml  Net -447 ml   Filed Weights   11/29/19 0337 11/30/19 0459 12/01/19 0251  Weight: 93.5 kg 92.9 kg 92.5 kg    Telemetry    SR with frequent PVCs in a pattern of bigeminy and trigeminy at times with occasional ventricular couplets - Personally Reviewed  ECG    No new tracings - Personally Reviewed  Physical Exam   GEN: No acute distress.   Neck: No JVD. Cardiac: RRR, I/VI systolic murmur, no rubs, or gallops.  Respiratory: Clear to auscultation bilaterally.  GI: Soft,  nontender, non-distended.   MS: No edema; No deformity. Neuro:  Alert and oriented x 3; Nonfocal.  Psych: Normal affect.  Labs    Chemistry Recent Labs  Lab 11/28/19 [id number] 11/28/19 0927 11/29/19 0558 11/30/19 0438 12/01/19 0603  NA 140   < > 141 140 137  K 3.8   < > 4.0 4.1 4.3  CL 103   < > 100 95* 95*  CO2 26   < > 31 30 31   GLUCOSE 129*   < > 110* 122* 114*  BUN 13   < > 17 19 34*  CREATININE 0.64   < > 0.65 0.64 0.74  CALCIUM 9.1   < > 10.4* 9.6 9.6  PROT 7.7  --   --   --   --   ALBUMIN 4.2  --   --   --   --   AST 42*  --   --   --   --   ALT 44  --   --   --   --   ALKPHOS 81  --   --   --   --   BILITOT 0.8  --   --   --   --  GFRNONAA >60   < > >60 >60 >60  GFRAA >60   < > >60 >60 >60  ANIONGAP 11   < > 10 15 11    < > = values in this interval not displayed.     Hematology Recent Labs  Lab 11/28/19 0927 11/30/19 0438 12/01/19 0603  WBC 10.6* 12.2* 11.3*  RBC 4.90 4.85 4.68  HGB 14.1 13.9 13.4  HCT 44.4 43.1 41.7  MCV 90.6 88.9 89.1  MCH 28.8 28.7 28.6  MCHC 31.8 32.3 32.1  RDW 14.1 13.8 14.0  PLT 395 422* 416*    Cardiac EnzymesNo results for input(s): TROPONINI in the last 168 hours. No results for input(s): TROPIPOC in the last 168 hours.   BNP Recent Labs  Lab 11/28/19 0927  BNP 188.0*     DDimer No results for input(s): DDIMER in the last 168 hours.   Radiology    CXR 11/28/2019: IMPRESSION: Bilateral interstitial and patchy alveolar airspace opacities concerning for pulmonary edema versus multilobar pneumonia.  Cardiac Studies   2D Echo 11/29/2019: 1. Left ventricular ejection fraction, by estimation, is 25 to 30%. The  left ventricle has severely decreased function. The left ventricle  demonstrates global hypokinesis. The left ventricular internal cavity size  was mildly dilated. There is mild left  ventricular hypertrophy. Left ventricular diastolic parameters are  consistent with Grade I diastolic dysfunction (impaired  relaxation).  2. Right ventricular systolic function is normal. The right ventricular  size is normal. Tricuspid regurgitation signal is inadequate for assessing  PA pressure.  3. Left atrial size was mildly dilated.  4. The inferior vena cava is normal in size with greater than 50%  respiratory variability, suggesting right atrial pressure of 3 mmHg. _______________  2D Echo 12/2012: - Left ventricle: The cavity size was normal. Systolic  function was normal. The estimated ejection fraction was  in the range of 50% to 55%. Wall motion was normal; there  were no regional wall motion abnormalities. Doppler  parameters are consistent with abnormal left ventricular  relaxation (grade 1 diastolic dysfunction).  - Left atrium: The atrium was normal in size.  - Right ventricle: Systolic function was normal.  - Pulmonary arteries: Systolic pressure was within the  normal range.  _______________  [facility] 12/2012: Moderate 1-vessel CAD involving the RCA with 50% stenosis and mild proximal LCx disease with an EF of 50% and moderate to severe systemic hypertension and moderately elevated LVEDP. Medical therapy advised.   Patient Profile     69 y.o. female with history of nonobstructive CAD, HFpEF, admitted with acute systolic CHF and acute on chronic diastolic CHF.   Assessment & Plan    1. Acute systolic CHF with acute on chronic diastolic CHF: -Volume status much improved and appears euvolemic -Found to have a new CM this admission with EF 25-30% (previously low normal) -Transition from IV Lasix to PO Lasix 40 mg daily -[facility] 3/23 -If cath is nonobstructive, recommend outpatient cardiac monitoring to evaluate ventricular ectopy burden which may be contributing to her CM -Lytes at goal -Recent TSH normal -Continue Coreg and losartan -Look to add spironolactone prior to discharge -As an outpatient, consider transitioning to [facility], as BP allows, along with addition of SGLT2  inhibitor  -Daily weights -Strict I/O -CHF education  -Risks and benefits of cardiac catheterization have been discussed with the patient including risks of bleeding, bruising, infection, kidney damage, stroke, heart attack, urgent need for bypass, injury to a limb, and death. The patient understands these  risks and is willing to proceed with the procedure. All questions have been answered and concerns listened to   2. Nonobstructive CAD with elevated troponin: -No chest pain -High sensitivity troponin peaked at 415 -Echo with new CM as above -Cath 3/23 as above  3. Acute hypoxic respiratory distress: -Improved -Now on room air -Likely in the setting of the above  4. Hypertensive urgency: -BP improved -Continue current therapy as outlined above    For questions or updates, please contact [place] Please consult [email or web address] for contact info under Cardiology/STEMI.    Signed, Christell Faith, PA-C [place] Pager: [phone] 12/01/2019, 10:44 AM

## 2019-12-01 NOTE — Progress Notes (Signed)
Notified Dr. Saunders Revel that patient has had trigeminal PVCs and quad PVCs. Vital signs stable. Md acknowledges and no new orders. Will continue to monitor.

## 2019-12-01 NOTE — H&P (View-Only) (Signed)
Progress Note  Patient Name: [name] [name] Date of Encounter: 12/01/2019  Primary Cardiologist: Fletcher Anon  Subjective   Dyspnea resolved. Now on room air. No symptoms with ambulation. No chest pain or palpitations. Documented UOP - 3.9 L for the admission. Weight 92.9-->92.5 kg. Echo over the weekend showed a new CM with an EF of 25-30% (prior low normal). Tolerating Coreg and losartan with BP in the 90s to low [id number] systolic.    Inpatient Medications    Scheduled Meds: . aspirin EC  81 mg Oral Daily  . atorvastatin  40 mg Oral q1800  . carvedilol  6.25 mg Oral BID WC  . enoxaparin (LOVENOX) injection  40 mg Subcutaneous Q24H  . fenofibrate  160 mg Oral Daily  . FLUoxetine  20 mg Oral Daily  . furosemide  40 mg Intravenous BID  . losartan  100 mg Oral Daily  . sodium chloride flush  3 mL Intravenous Q12H   Continuous Infusions: . sodium chloride     PRN Meds: sodium chloride, acetaminophen, ondansetron (ZOFRAN) IV, sodium chloride flush, tiZANidine   Vital Signs    Vitals:   11/30/19 1841 11/30/19 1959 12/01/19 0251 12/01/19 0749  BP: 95/65 96/63 (!) 107/53 (!) 118/55  Pulse: 84 82 77 73  Resp: 20 20 20 18   Temp:  98.1 F (36.7 C) 97.7 F (36.5 C) 97.6 F (36.4 C)  TempSrc:  Oral Oral Oral  SpO2:  96% 95% 92%  Weight:   92.5 kg   Height:        Intake/Output Summary (Last 24 hours) at 12/01/2019 1044 Last data filed at 12/01/2019 0253 Gross per 24 hour  Intake 3 ml  Output 450 ml  Net -447 ml   Filed Weights   11/29/19 0337 11/30/19 0459 12/01/19 0251  Weight: 93.5 kg 92.9 kg 92.5 kg    Telemetry    SR with frequent PVCs in a pattern of bigeminy and trigeminy at times with occasional ventricular couplets - Personally Reviewed  ECG    No new tracings - Personally Reviewed  Physical Exam   GEN: No acute distress.   Neck: No JVD. Cardiac: RRR, I/VI systolic murmur, no rubs, or gallops.  Respiratory: Clear to auscultation bilaterally.  GI: Soft,  nontender, non-distended.   MS: No edema; No deformity. Neuro:  Alert and oriented x 3; Nonfocal.  Psych: Normal affect.  Labs    Chemistry Recent Labs  Lab 11/28/19 [phone] 11/28/19 0927 11/29/19 0558 11/30/19 0438 12/01/19 0603  NA 140   < > 141 140 137  K 3.8   < > 4.0 4.1 4.3  CL 103   < > 100 95* 95*  CO2 26   < > 31 30 31   GLUCOSE 129*   < > 110* 122* 114*  BUN 13   < > 17 19 34*  CREATININE 0.64   < > 0.65 0.64 0.74  CALCIUM 9.1   < > 10.4* 9.6 9.6  PROT 7.7  --   --   --   --   ALBUMIN 4.2  --   --   --   --   AST 42*  --   --   --   --   ALT 44  --   --   --   --   ALKPHOS 81  --   --   --   --   BILITOT 0.8  --   --   --   --  GFRNONAA >60   < > >60 >60 >60  GFRAA >60   < > >60 >60 >60  ANIONGAP 11   < > 10 15 11    < > = values in this interval not displayed.     Hematology Recent Labs  Lab 11/28/19 0927 11/30/19 0438 12/01/19 0603  WBC 10.6* 12.2* 11.3*  RBC 4.90 4.85 4.68  HGB 14.1 13.9 13.4  HCT 44.4 43.1 41.7  MCV 90.6 88.9 89.1  MCH 28.8 28.7 28.6  MCHC 31.8 32.3 32.1  RDW 14.1 13.8 14.0  PLT 395 422* 416*    Cardiac EnzymesNo results for input(s): TROPONINI in the last 168 hours. No results for input(s): TROPIPOC in the last 168 hours.   BNP Recent Labs  Lab 11/28/19 0927  BNP 188.0*     DDimer No results for input(s): DDIMER in the last 168 hours.   Radiology    CXR 11/28/2019: IMPRESSION: Bilateral interstitial and patchy alveolar airspace opacities concerning for pulmonary edema versus multilobar pneumonia.  Cardiac Studies   2D Echo 11/29/2019: 1. Left ventricular ejection fraction, by estimation, is 25 to 30%. The  left ventricle has severely decreased function. The left ventricle  demonstrates global hypokinesis. The left ventricular internal cavity size  was mildly dilated. There is mild left  ventricular hypertrophy. Left ventricular diastolic parameters are  consistent with Grade I diastolic dysfunction (impaired  relaxation).  2. Right ventricular systolic function is normal. The right ventricular  size is normal. Tricuspid regurgitation signal is inadequate for assessing  PA pressure.  3. Left atrial size was mildly dilated.  4. The inferior vena cava is normal in size with greater than 50%  respiratory variability, suggesting right atrial pressure of 3 mmHg. _______________  2D Echo 12/2012: - Left ventricle: The cavity size was normal. Systolic  function was normal. The estimated ejection fraction was  in the range of 50% to 55%. Wall motion was normal; there  were no regional wall motion abnormalities. Doppler  parameters are consistent with abnormal left ventricular  relaxation (grade 1 diastolic dysfunction).  - Left atrium: The atrium was normal in size.  - Right ventricle: Systolic function was normal.  - Pulmonary arteries: Systolic pressure was within the  normal range.  _______________  [facility] 12/2012: Moderate 1-vessel CAD involving the RCA with 50% stenosis and mild proximal LCx disease with an EF of 50% and moderate to severe systemic hypertension and moderately elevated LVEDP. Medical therapy advised.   Patient Profile     69 y.o. female with history of nonobstructive CAD, HFpEF, admitted with acute systolic CHF and acute on chronic diastolic CHF.   Assessment & Plan    1. Acute systolic CHF with acute on chronic diastolic CHF: -Volume status much improved and appears euvolemic -Found to have a new CM this admission with EF 25-30% (previously low normal) -Transition from IV Lasix to PO Lasix 40 mg daily -[facility] 3/23 -If cath is nonobstructive, recommend outpatient cardiac monitoring to evaluate ventricular ectopy burden which may be contributing to her CM -Lytes at goal -Recent TSH normal -Continue Coreg and losartan -Look to add spironolactone prior to discharge -As an outpatient, consider transitioning to [facility], as BP allows, along with addition of SGLT2  inhibitor  -Daily weights -Strict I/O -CHF education  -Risks and benefits of cardiac catheterization have been discussed with the patient including risks of bleeding, bruising, infection, kidney damage, stroke, heart attack, urgent need for bypass, injury to a limb, and death. The patient understands these  risks and is willing to proceed with the procedure. All questions have been answered and concerns listened to   2. Nonobstructive CAD with elevated troponin: -No chest pain -High sensitivity troponin peaked at 415 -Echo with new CM as above -Cath 3/23 as above  3. Acute hypoxic respiratory distress: -Improved -Now on room air -Likely in the setting of the above  4. Hypertensive urgency: -BP improved -Continue current therapy as outlined above    For questions or updates, please contact [place] Please consult [email or web address] for contact info under Cardiology/STEMI.    Signed, Christell Faith, PA-C [place] Pager: [phone] 12/01/2019, 10:44 AM

## 2019-12-01 NOTE — Plan of Care (Signed)
  Problem: Education: Goal: Knowledge of General Education information will improve Description: Including pain rating scale, medication(s)/side effects and non-pharmacologic comfort measures Outcome: Progressing   Problem: Clinical Measurements: Goal: Ability to maintain clinical measurements within normal limits will improve Outcome: Progressing   Problem: Activity: Goal: Risk for activity intolerance will decrease Outcome: Progressing   Problem: Education: Goal: Ability to demonstrate management of disease process will improve Outcome: Progressing Goal: Ability to verbalize understanding of medication therapies will improve Outcome: Progressing   Problem: Activity: Goal: Capacity to carry out activities will improve Outcome: Progressing   Problem: Cardiac: Goal: Ability to achieve and maintain adequate cardiopulmonary perfusion will improve Outcome: Progressing

## 2019-12-01 NOTE — Care Management Important Message (Signed)
Important Message  Patient Details  Name: [name] [name] MRN: [id number] Date of Birth: 12/17/1950   Medicare Important Message Given:  Yes     Dannette Barbara 12/01/2019, 11:57 AM

## 2019-12-01 NOTE — Progress Notes (Signed)
Consent signed by patient for cardiac cath 2/23.

## 2019-12-01 NOTE — Progress Notes (Signed)
PROGRESS NOTE    [name] [name]  [id number] DOB: September 07, 1951 DOA: 11/28/2019 PCP: Owens Loffler, MD    Assessment & Plan:   Principal Problem:   Acute on chronic diastolic CHF (congestive heart failure) ([place]) Active Problems:   Hypertensive urgency   Elevated troponin   Depression   Acute on chronic respiratory failure with hypoxia (HCC)   CHF (congestive heart failure), NYHA class I, acute on chronic, diastolic ([place])    [name] [name] is a 69 y.o. Caucasian female with medical history significant for chronic diastolic dysfunction CHF, coronary artery disease, hypertension and dyslipidemia who presents to the emergency room for evaluation of shortness of breath that has progressively worsened over the last 2 weeks.  Initially her shortness of breath was with exertion, but now she is short of breath at rest.  Shortness of breath is associated with a nonproductive cough.  She denies having any leg swelling.   Acute hypoxic respiratory failure 2/2 Acute combined CHF exacerbation, resolved --severe dyspnea, had to be on BiPAP initially, then down to 2L , today was sating well on RA even with walking. --Patient's last 2D echocardiogram showed a normal LVEF (From 2014).  TTE during this admission showed severely reduced LVEF 25-30% with grade I diastolic dysfunction. --Cardiology consulted PLAN: --continue Lasix 40 mg x1 today and transition to PO Lasix 40 mg daily tomorrow --Continue losartan and coreg --L/R heart cath tomorrow, per cards  Hypertensive urgency Likely secondary to medication noncompliance --continue losartan and coreg --diuresis  Elevated troponin likely due to demand ischemia from CHF exacerbation --Trop peaked at 415.  No chest pain.  UTI, ruled out Patient has pyuria but no urinary symptoms at all. --abx d/c'ed   DVT prophylaxis: Lovenox SQ Code Status: Full code  Family Communication:  Disposition Plan: Home likely tomorrow after L/R heart  cath if no intervention.   Subjective and Interval History:  Pt reported feeling good and back to baseline.  Had BM.  No fever, dyspnea, chest pain, abdominal pain, N/V/D, dysuria, increased swelling.  Cardiology decided to do inpatient ischemic eval with heart cath.   Objective: Vitals:   12/01/19 0251 12/01/19 0749 12/01/19 1151 12/01/19 1605  BP: (!) 107/53 (!) 118/55 110/60 103/62  Pulse: 77 73 62 72  Resp: 20 18 18 18   Temp: 97.7 F (36.5 C) 97.6 F (36.4 C) 97.6 F (36.4 C) 98.3 F (36.8 C)  TempSrc: Oral Oral Oral   SpO2: 95% 92% 91% 92%  Weight: 92.5 kg     Height:        Intake/Output Summary (Last 24 hours) at 12/01/2019 1616 Last data filed at 12/01/2019 1154 Gross per 24 hour  Intake 360 ml  Output 800 ml  Net -440 ml   Filed Weights   11/29/19 0337 11/30/19 0459 12/01/19 0251  Weight: 93.5 kg 92.9 kg 92.5 kg    Examination:   Constitutional: NAD, AAOx3 HEENT: conjunctivae and lids normal, EOMI CV: RRR no M,R,G. Distal pulses +2.  No cyanosis.   RESP: clear, normal respiratory effort, on RA GI: +BS, NTND Extremities: No effusions, edema, or tenderness in BLE MSK: no joint enlargement or tenderness of both UE and LE SKIN: warm, dry and intact Neuro: II - XII grossly intact.  Sensation intact Psych: Normal mood and affect.  Appropriate judgement and reason   Data Reviewed: I have personally reviewed following labs and imaging studies  CBC: Recent Labs  Lab 11/28/19 0927 11/30/19 0438 12/01/19 0603  WBC 10.6* 12.2*  11.3*  NEUTROABS 8.1*  --   --   HGB 14.1 13.9 13.4  HCT 44.4 43.1 41.7  MCV 90.6 88.9 89.1  PLT 395 422* [id number]*   Basic Metabolic Panel: Recent Labs  Lab 11/28/19 0927 11/29/19 0558 11/29/19 0909 11/30/19 0438 12/01/19 0603  NA 140 141  --  140 137  K 3.8 4.0  --  4.1 4.3  CL 103 100  --  95* 95*  CO2 26 31  --  30 31  GLUCOSE 129* 110*  --  122* 114*  BUN 13 17  --  19 34*  CREATININE 0.64 0.65  --  0.64 0.74  CALCIUM  9.1 10.4*  --  9.6 9.6  MG  --   --  1.9 1.7 2.0   GFR: Estimated Creatinine Clearance: 74.2 mL/min (by C-G formula based on SCr of 0.74 mg/dL). Liver Function Tests: Recent Labs  Lab 11/28/19 0927  AST 42*  ALT 44  ALKPHOS 81  BILITOT 0.8  PROT 7.7  ALBUMIN 4.2   No results for input(s): LIPASE, AMYLASE in the last 168 hours. No results for input(s): AMMONIA in the last 168 hours. Coagulation Profile: No results for input(s): INR, PROTIME in the last 168 hours. Cardiac Enzymes: No results for input(s): CKTOTAL, CKMB, CKMBINDEX, TROPONINI in the last 168 hours. BNP (last 3 results) No results for input(s): PROBNP in the last 8760 hours. HbA1C: No results for input(s): HGBA1C in the last 72 hours. CBG: No results for input(s): GLUCAP in the last 168 hours. Lipid Profile: Recent Labs    11/29/19 0558  CHOL 155  HDL 35*  LDLCALC 87  TRIG 165*  CHOLHDL 4.4   Thyroid Function Tests: No results for input(s): TSH, T4TOTAL, FREET4, T3FREE, THYROIDAB in the last 72 hours. Anemia Panel: No results for input(s): VITAMINB12, FOLATE, FERRITIN, TIBC, IRON, RETICCTPCT in the last 72 hours. Sepsis Labs: No results for input(s): PROCALCITON, LATICACIDVEN in the last 168 hours.  Recent Results (from the past 240 hour(s))  SARS CORONAVIRUS 2 (TAT 6-24 HRS) Nasopharyngeal Nasopharyngeal Swab     Status: None   Collection Time: 11/28/19  9:29 PM   Specimen: Nasopharyngeal Swab  Result Value Ref Range Status   SARS Coronavirus 2 NEGATIVE NEGATIVE Final    Comment: (NOTE) SARS-CoV-2 target nucleic acids are NOT DETECTED. The SARS-CoV-2 RNA is generally detectable in upper and lower respiratory specimens during the acute phase of infection. Negative results do not preclude SARS-CoV-2 infection, do not rule out co-infections with other pathogens, and should not be used as the sole basis for treatment or other patient management decisions. Negative results must be combined with  clinical observations, patient history, and epidemiological information. The expected result is Negative. Fact Sheet for Patients: SugarRoll.be Fact Sheet for Healthcare Providers: https://www.woods-mathews.com/ This test is not yet approved or cleared by the [place] FDA and  has been authorized for detection and/or diagnosis of SARS-CoV-2 by FDA under an Emergency Use Authorization (EUA). This EUA will remain  in effect (meaning this test can be used) for the duration of the COVID-19 declaration under Section 56 4(b)(1) of the Act, 21 U.S.C. section 360bbb-3(b)(1), unless the authorization is terminated or revoked sooner. Performed at [facility], [place] [address], [place], [place] [address]       Radiology Studies: No results found.   Scheduled Meds: . aspirin EC  81 mg Oral Daily  . atorvastatin  40 mg Oral q1800  . carvedilol  6.25 mg  Oral BID WC  . enoxaparin (LOVENOX) injection  40 mg Subcutaneous Q24H  . fenofibrate  160 mg Oral Daily  . FLUoxetine  20 mg Oral Daily  . [START ON 12/02/2019] furosemide  40 mg Oral Daily  . losartan  100 mg Oral Daily  . sodium chloride flush  3 mL Intravenous Q12H   Continuous Infusions: . sodium chloride       LOS: 3 days     Enzo Bi, MD [facility] If 7PM-7AM, please contact night-coverage 12/01/2019, 4:16 PM

## 2019-12-02 ENCOUNTER — Encounter
Admission: EM | Disposition: A | Discharge status: Home Health | Payer: Self-pay | Source: Home / Self Care | Attending: Hospitalist

## 2019-12-02 DIAGNOSIS — I255 Ischemic cardiomyopathy: Secondary | ICD-10-CM

## 2019-12-02 DIAGNOSIS — I2510 Atherosclerotic heart disease of native coronary artery without angina pectoris: Secondary | ICD-10-CM

## 2019-12-02 DIAGNOSIS — I10 Essential (primary) hypertension: Secondary | ICD-10-CM

## 2019-12-02 DIAGNOSIS — I5021 Acute systolic (congestive) heart failure: Secondary | ICD-10-CM

## 2019-12-02 DIAGNOSIS — I5023 Acute on chronic systolic (congestive) heart failure: Secondary | ICD-10-CM

## 2019-12-02 HISTORY — PX: RIGHT/LEFT HEART CATH AND CORONARY ANGIOGRAPHY: CATH118266

## 2019-12-02 HISTORY — DX: Ischemic cardiomyopathy: I25.5

## 2019-12-02 LAB — MAGNESIUM: Magnesium: 2.2 mg/dL (ref 1.7–2.4)

## 2019-12-02 LAB — BASIC METABOLIC PANEL
Anion gap: 10 (ref 5–15)
BUN: 36 mg/dL — ABNORMAL HIGH (ref 8–23)
CO2: 31 mmol/L (ref 22–32)
Calcium: 9.5 mg/dL (ref 8.9–10.3)
Chloride: 97 mmol/L — ABNORMAL LOW (ref 98–111)
Creatinine, Ser: 0.7 mg/dL (ref 0.44–1.00)
GFR calc Af Amer: >60 mL/min (ref >60)
GFR calc non Af Amer: >60 mL/min (ref >60)
Glucose, Bld: 104 mg/dL — ABNORMAL HIGH (ref 70–99)
Potassium: 4.1 mmol/L (ref 3.5–5.1)
Sodium: 138 mmol/L (ref 135–145)

## 2019-12-02 LAB — CBC
HCT: 42.7 % (ref 36.0–46.0)
Hemoglobin: 13.6 g/dL (ref 12.0–15.0)
MCH: 28.3 pg (ref 26.0–34.0)
MCHC: 31.9 g/dL (ref 30.0–36.0)
MCV: 89 fL (ref 80.0–100.0)
Platelets: 399 10*3/uL (ref 150–400)
RBC: 4.8 MIL/uL (ref 3.87–5.11)
RDW: 13.6 % (ref 11.5–15.5)
WBC: 9.4 10*3/uL (ref 4.0–10.5)
nRBC: 0 % (ref 0.0–0.2)

## 2019-12-02 SURGERY — RIGHT/LEFT HEART CATH AND CORONARY ANGIOGRAPHY
Anesthesia: Moderate Sedation

## 2019-12-02 MED ORDER — FUROSEMIDE 10 MG/ML IJ SOLN
INTRAMUSCULAR | Status: AC
  Administered 2019-12-02: 40 mg via INTRAVENOUS
  Filled 2019-12-02: qty 4

## 2019-12-02 MED ORDER — SODIUM CHLORIDE 0.9% FLUSH: 3.0000 mL | INTRAVENOUS | Status: DC | PRN

## 2019-12-02 MED ORDER — HEPARIN (PORCINE) IN NACL 1000-0.9 UT/500ML-% IV SOLN
INTRAVENOUS | Status: DC | PRN
  Administered 2019-12-02 (×2): 500 mL

## 2019-12-02 MED ORDER — HEPARIN SODIUM (PORCINE) 1000 UNIT/ML IJ SOLN
INTRAMUSCULAR | Status: AC
  Filled 2019-12-02: qty 1

## 2019-12-02 MED ORDER — SODIUM CHLORIDE 0.9% FLUSH
3.0000 mL | Freq: Two times a day (BID) | INTRAVENOUS | Status: DC
  Administered 2019-12-03: 3 mL via INTRAVENOUS

## 2019-12-02 MED ORDER — MIDAZOLAM HCL 2 MG/2ML IJ SOLN
INTRAMUSCULAR | Status: DC | PRN
  Administered 2019-12-02: 1 mg via INTRAVENOUS

## 2019-12-02 MED ORDER — SODIUM CHLORIDE 0.9 % IV SOLN: INTRAVENOUS | Status: DC

## 2019-12-02 MED ORDER — SODIUM CHLORIDE 0.9 % IV SOLN: 250.0000 mL | INTRAVENOUS | Status: DC | PRN

## 2019-12-02 MED ORDER — SODIUM CHLORIDE 0.9% FLUSH: 3.0000 mL | Freq: Two times a day (BID) | INTRAVENOUS | Status: DC

## 2019-12-02 MED ORDER — MIDAZOLAM HCL 2 MG/2ML IJ SOLN
INTRAMUSCULAR | Status: AC
  Filled 2019-12-02: qty 2

## 2019-12-02 MED ORDER — FENTANYL CITRATE (PF) 100 MCG/2ML IJ SOLN
INTRAMUSCULAR | Status: AC
  Filled 2019-12-02: qty 2

## 2019-12-02 MED ORDER — FENTANYL CITRATE (PF) 100 MCG/2ML IJ SOLN
INTRAMUSCULAR | Status: DC | PRN
  Administered 2019-12-02: 25 ug via INTRAVENOUS

## 2019-12-02 MED ORDER — HEPARIN SODIUM (PORCINE) 1000 UNIT/ML IJ SOLN
INTRAMUSCULAR | Status: DC | PRN
  Administered 2019-12-02: 4500 [IU] via INTRAVENOUS

## 2019-12-02 MED ORDER — HEPARIN (PORCINE) IN NACL 1000-0.9 UT/500ML-% IV SOLN
INTRAVENOUS | Status: AC
  Filled 2019-12-02: qty 1000

## 2019-12-02 MED ORDER — IOHEXOL 300 MG/ML  SOLN
INTRAMUSCULAR | Status: DC | PRN
  Administered 2019-12-02: 70 mL

## 2019-12-02 MED ORDER — VERAPAMIL HCL 2.5 MG/ML IV SOLN
INTRAVENOUS | Status: AC
  Filled 2019-12-02: qty 2

## 2019-12-02 MED ORDER — FUROSEMIDE 10 MG/ML IJ SOLN: 40.0000 mg | Freq: Every day | INTRAMUSCULAR | Status: DC

## 2019-12-02 MED ORDER — ENOXAPARIN SODIUM 40 MG/0.4ML ~~LOC~~ SOLN: 40.0000 mg | SUBCUTANEOUS | Status: DC

## 2019-12-02 MED ORDER — NITROGLYCERIN 1 MG/10 ML FOR IR/CATH LAB
INTRA_ARTERIAL | Status: AC
  Filled 2019-12-02: qty 10

## 2019-12-02 MED ORDER — HYDRALAZINE HCL 20 MG/ML IJ SOLN: 10.0000 mg | INTRAMUSCULAR | Status: AC | PRN

## 2019-12-02 MED ORDER — SPIRONOLACTONE 25 MG PO TABS
25.0000 mg | ORAL_TABLET | Freq: Every day | ORAL | Status: DC
  Administered 2019-12-02 – 2019-12-03 (×2): 25 mg via ORAL
  Filled 2019-12-02 (×2): qty 1

## 2019-12-02 MED ORDER — NITROGLYCERIN 1 MG/10 ML FOR IR/CATH LAB
INTRA_ARTERIAL | Status: DC | PRN
  Administered 2019-12-02: 200 ug

## 2019-12-02 MED ORDER — FUROSEMIDE 10 MG/ML IJ SOLN
40.0000 mg | Freq: Two times a day (BID) | INTRAMUSCULAR | Status: DC
  Administered 2019-12-02 – 2019-12-03 (×2): 40 mg via INTRAVENOUS
  Filled 2019-12-02 (×2): qty 4

## 2019-12-02 SURGICAL SUPPLY — 11 items
CATH 5F 110X4 TIG (CATHETERS) ×1 IMPLANT
CATH BALLN WEDGE 5F 110CM (CATHETERS) ×1 IMPLANT
CATH INFINITI 5FR ANG PIGTAIL (CATHETERS) ×1 IMPLANT
CATH INFINITI JR4 5F (CATHETERS) ×1 IMPLANT
DEVICE RAD TR BAND REGULAR (VASCULAR PRODUCTS) ×1 IMPLANT
GLIDESHEATH SLEND SS 6F .021 (SHEATH) ×1 IMPLANT
KIT MANI 3VAL PERCEP (MISCELLANEOUS) ×1 IMPLANT
KIT RIGHT HEART (MISCELLANEOUS) ×1 IMPLANT
PACK CARDIAC CATH (CUSTOM PROCEDURE TRAY) ×2 IMPLANT
SHEATH GLIDE SLENDER 4/5FR (SHEATH) ×1 IMPLANT
WIRE ROSEN-J .035X260CM (WIRE) ×1 IMPLANT

## 2019-12-02 NOTE — Progress Notes (Signed)
PROGRESS NOTE    HARRIETTA [name]  [id number] DOB: Oct 15, 1950 DOA: 11/28/2019 PCP: Owens Loffler, MD    Assessment & Plan:   Principal Problem:   Acute on chronic diastolic CHF (congestive heart failure) ([place]) Active Problems:   Hypertensive urgency   Elevated troponin   Depression   Acute on chronic respiratory failure with hypoxia (HCC)   CHF (congestive heart failure), NYHA class I, acute on chronic, diastolic (HCC)   Acute systolic congestive heart failure ([place])    [name] [name] is a 69 y.o. Caucasian female with medical history significant for chronic diastolic dysfunction CHF, coronary artery disease, hypertension and dyslipidemia who presents to the emergency room for evaluation of shortness of breath that has progressively worsened over the last 2 weeks.  Initially her shortness of breath was with exertion, but now she is short of breath at rest.  Shortness of breath was associated with a nonproductive cough.  She denied having any leg swelling.   Acute hypoxic respiratory failure 2/2 Acute combined CHF exacerbation --severe dyspnea, had to be on BiPAP initially, then down to 2L [place].  Diuresed well and was down to RA even with walking on 3/22. --Patient's last 2D echocardiogram showed a normal LVEF (From 2014).  TTE during this admission showed severely reduced LVEF 25-30% with grade I diastolic dysfunction. --Cardiology consulted PLAN: --continue Lasix 40 mg BID, per cards --Continue current doses of losartan and coreg --Add spironolactone 25 mg daily.today, per cards --consider transitioning to [facility] at cards outpatient followup  Coronary artery disease ischemic cardiomyopathy --R/L heart cath done today.  "Diffuse nature of disease and small size of mid/distal LAD and distal RCA/branches make PCI and CABG difficult."   PLAN: --Plan for medical management for now --Continue aspirin and atorvastatin for secondary prevention. --Continue carvedilol, as  above. --Defer revascularization at this time.  Cardiology to readdress as an outpatient after optimization of heart failure given limited options.  Hypertensive urgency, resolved Likely secondary to medication noncompliance --continue losartan and coreg --continue IV lasix --Add spironolactone 25 mg daily.today, per cards  Elevated troponin likely due to demand ischemia from CHF exacerbation --Trop peaked at 415.  No chest pain.    UTI, ruled out Patient has pyuria but no urinary symptoms at all. --abx d/c'ed   DVT prophylaxis: Lovenox SQ Code Status: Full code  Family Communication:  Disposition Plan: Home likely tomorrow if cardiology clears for discharge.   Subjective and Interval History:  Had L/R heart cath today without intervention.  Post procedure, pt reported doing fine, just tired from lack of sleep.  No fever, dyspnea, chest pain, abdominal pain, N/V/D, dysuria.   Objective: Vitals:   12/02/19 1100 12/02/19 1119 12/02/19 1133 12/02/19 1708  BP: 118/63 (!) 132/59 137/88 132/70  Pulse: 74  82 87  Resp: 19  18 18   Temp:   98.1 F (36.7 C) 98.1 F (36.7 C)  TempSrc:   Oral Oral  SpO2: 98%  94% 93%  Weight:      Height:        Intake/Output Summary (Last 24 hours) at 12/02/2019 1922 Last data filed at 12/02/2019 1852 Gross per 24 hour  Intake 240 ml  Output 1700 ml  Net -1460 ml   Filed Weights   12/01/19 0251 12/02/19 0315 12/02/19 0709  Weight: 92.5 kg 92.9 kg 92.9 kg    Examination:   Constitutional: NAD, AAOx3 HEENT: conjunctivae and lids normal, EOMI CV: RRR no M,R,G. Distal pulses +2.  No cyanosis.  RESP: Mild crackles at posterior bases, normal respiratory effort, on RA GI: +BS, NTND Extremities: No effusions, edema, or tenderness in BLE MSK: no joint enlargement or tenderness of both UE and LE SKIN: warm, dry and intact Neuro: II - XII grossly intact.  Sensation intact Psych: Normal mood and affect.  Appropriate judgement and  reason   Data Reviewed: I have personally reviewed following labs and imaging studies  CBC: Recent Labs  Lab 11/28/19 0927 11/30/19 0438 12/01/19 0603 12/02/19 0449  WBC 10.6* 12.2* 11.3* 9.4  NEUTROABS 8.1*  --   --   --   HGB 14.1 13.9 13.4 13.6  HCT 44.4 43.1 41.7 42.7  MCV 90.6 88.9 89.1 89.0  PLT 395 422* 416* [id number]   Basic Metabolic Panel: Recent Labs  Lab 11/28/19 0927 11/29/19 0558 11/29/19 0909 11/30/19 0438 12/01/19 0603 12/02/19 0449  NA 140 141  --  140 137 138  K 3.8 4.0  --  4.1 4.3 4.1  CL 103 100  --  95* 95* 97*  CO2 26 31  --  30 31 31   GLUCOSE 129* 110*  --  122* 114* 104*  BUN 13 17  --  19 34* 36*  CREATININE 0.64 0.65  --  0.64 0.74 0.70  CALCIUM 9.1 10.4*  --  9.6 9.6 9.5  MG  --   --  1.9 1.7 2.0 2.2   GFR: Estimated Creatinine Clearance: 74.4 mL/min (by C-G formula based on SCr of 0.7 mg/dL). Liver Function Tests: Recent Labs  Lab 11/28/19 0927  AST 42*  ALT 44  ALKPHOS 81  BILITOT 0.8  PROT 7.7  ALBUMIN 4.2   No results for input(s): LIPASE, AMYLASE in the last 168 hours. No results for input(s): AMMONIA in the last 168 hours. Coagulation Profile: No results for input(s): INR, PROTIME in the last 168 hours. Cardiac Enzymes: No results for input(s): CKTOTAL, CKMB, CKMBINDEX, TROPONINI in the last 168 hours. BNP (last 3 results) No results for input(s): PROBNP in the last 8760 hours. HbA1C: No results for input(s): HGBA1C in the last 72 hours. CBG: No results for input(s): GLUCAP in the last 168 hours. Lipid Profile: No results for input(s): CHOL, HDL, LDLCALC, TRIG, CHOLHDL, LDLDIRECT in the last 72 hours. Thyroid Function Tests: No results for input(s): TSH, T4TOTAL, FREET4, T3FREE, THYROIDAB in the last 72 hours. Anemia Panel: No results for input(s): VITAMINB12, FOLATE, FERRITIN, TIBC, IRON, RETICCTPCT in the last 72 hours. Sepsis Labs: No results for input(s): PROCALCITON, LATICACIDVEN in the last 168 hours.  Recent  Results (from the past 240 hour(s))  SARS CORONAVIRUS 2 (TAT 6-24 HRS) Nasopharyngeal Nasopharyngeal Swab     Status: None   Collection Time: 11/28/19  9:29 PM   Specimen: Nasopharyngeal Swab  Result Value Ref Range Status   SARS Coronavirus 2 NEGATIVE NEGATIVE Final    Comment: (NOTE) SARS-CoV-2 target nucleic acids are NOT DETECTED. The SARS-CoV-2 RNA is generally detectable in upper and lower respiratory specimens during the acute phase of infection. Negative results do not preclude SARS-CoV-2 infection, do not rule out co-infections with other pathogens, and should not be used as the sole basis for treatment or other patient management decisions. Negative results must be combined with clinical observations, patient history, and epidemiological information. The expected result is Negative. Fact Sheet for Patients: SugarRoll.be Fact Sheet for Healthcare Providers: https://www.woods-mathews.com/ This test is not yet approved or cleared by the [place] FDA and  has been authorized for detection and/or diagnosis of  SARS-CoV-2 by FDA under an Emergency Use Authorization (EUA). This EUA will remain  in effect (meaning this test can be used) for the duration of the COVID-19 declaration under Section 56 4(b)(1) of the Act, 21 U.S.C. section 360bbb-3(b)(1), unless the authorization is terminated or revoked sooner. Performed at [facility], [place] [address], [place], [place] [address]       Radiology Studies: CARDIAC CATHETERIZATION  Result Date: 12/02/2019 Conclusions: 1. Significant two-vessel coronary artery disease with diffuse LAD disease of up to 70-80% in the mid/distal vessel and chronic subtotal occlusion of the mid RCA with left-to-right collaterals. 2. Moderately elevated left heart, right heart, and pulmonary artery pressures. 3. Low normal [place] cardiac output/index.  Recommendations: 1. Escalate diuresis and optimize  evidence-based heart failure therapy. 2. Medical management of coronary artery disease.  Small vessel size and diffuse disease involving LAD and RCA make percutaneous and surgical revascularization suboptimal. [name], MD [facility]     Scheduled Meds: . aspirin EC  81 mg Oral Daily  . atorvastatin  40 mg Oral q1800  . carvedilol  6.25 mg Oral BID WC  . [START ON 12/03/2019] enoxaparin (LOVENOX) injection  40 mg Subcutaneous Q24H  . fenofibrate  160 mg Oral Daily  . FLUoxetine  20 mg Oral Daily  . furosemide  40 mg Intravenous BID  . losartan  100 mg Oral Daily  . sodium chloride flush  3 mL Intravenous Q12H  . sodium chloride flush  3 mL Intravenous Q12H  . spironolactone  25 mg Oral Daily   Continuous Infusions: . sodium chloride    . sodium chloride       LOS: 4 days     Enzo Bi, MD [facility] If 7PM-7AM, please contact night-coverage 12/02/2019, 7:22 PM

## 2019-12-02 NOTE — Progress Notes (Signed)
Progress Note  Patient Name: [name] [name] Date of Encounter: 12/02/2019  Primary Cardiologist: Kathlyn Sacramento, MD   Subjective   Feeling okay without significant shortness of breath or chest pain.  Able to lie flat now.  Inpatient Medications    Scheduled Meds: . aspirin EC  81 mg Oral Daily  . atorvastatin  40 mg Oral q1800  . carvedilol  6.25 mg Oral BID WC  . [START ON 12/03/2019] enoxaparin (LOVENOX) injection  40 mg Subcutaneous Q24H  . fenofibrate  160 mg Oral Daily  . FLUoxetine  20 mg Oral Daily  . furosemide  40 mg Intravenous Daily  . losartan  100 mg Oral Daily  . sodium chloride flush  3 mL Intravenous Q12H  . sodium chloride flush  3 mL Intravenous Q12H  . spironolactone  25 mg Oral Daily   Continuous Infusions: . sodium chloride    . sodium chloride     PRN Meds: sodium chloride, sodium chloride, acetaminophen, diclofenac Sodium, hydrALAZINE, ondansetron (ZOFRAN) IV, sodium chloride flush, sodium chloride flush, tiZANidine   Vital Signs    Vitals:   12/02/19 1030 12/02/19 1100 12/02/19 1119 12/02/19 1133  BP: 123/67 118/63 (!) 132/59 137/88  Pulse: 74 74  82  Resp: 18 19  18   Temp:    98.1 F (36.7 C)  TempSrc:    Oral  SpO2: 90% 98%  94%  Weight:      Height:        Intake/Output Summary (Last 24 hours) at 12/02/2019 1246 Last data filed at 12/02/2019 1240 Gross per 24 hour  Intake 600 ml  Output 1700 ml  Net -1100 ml   Last 3 Weights 12/02/2019 12/02/2019 12/01/2019  Weight (lbs) 204 lb 12.9 oz 204 lb 12.8 oz 204 lb  Weight (kg) 92.9 kg 92.897 kg 92.534 kg      Telemetry    Sinus rhtyhm - Personally Reviewed  ECG    No new tracing.  Physical Exam   GEN: No acute distress.   Neck: No JVD Cardiac: RRR, no murmurs, rubs, or gallops.  Respiratory: Clear to auscultation bilaterally. GI: Soft, nontender, non-distended  MS: No edema; No deformity. Neuro:  Nonfocal  Psych: Normal affect   Labs    High Sensitivity Troponin:     Recent Labs  Lab 11/28/19 0927 11/28/19 1312 11/29/19 0909 11/29/19 1057  TROPONINIHS 52* 256* 415* 336*      Chemistry Recent Labs  Lab 11/28/19 0927 11/29/19 0558 11/30/19 0438 12/01/19 0603 12/02/19 0449  NA 140   < > 140 137 138  K 3.8   < > 4.1 4.3 4.1  CL 103   < > 95* 95* 97*  CO2 26   < > 30 31 31   GLUCOSE 129*   < > 122* 114* 104*  BUN 13   < > 19 34* 36*  CREATININE 0.64   < > 0.64 0.74 0.70  CALCIUM 9.1   < > 9.6 9.6 9.5  PROT 7.7  --   --   --   --   ALBUMIN 4.2  --   --   --   --   AST 42*  --   --   --   --   ALT 44  --   --   --   --   ALKPHOS 81  --   --   --   --   BILITOT 0.8  --   --   --   --  GFRNONAA >60   < > >60 >60 >60  GFRAA >60   < > >60 >60 >60  ANIONGAP 11   < > 15 11 10    < > = values in this interval not displayed.     Hematology Recent Labs  Lab 11/30/19 0438 12/01/19 0603 12/02/19 0449  WBC 12.2* 11.3* 9.4  RBC 4.85 4.68 4.80  HGB 13.9 13.4 13.6  HCT 43.1 41.7 42.7  MCV 88.9 89.1 89.0  MCH 28.7 28.6 28.3  MCHC 32.3 32.1 31.9  RDW 13.8 14.0 13.6  PLT 422* 416* 399    BNP Recent Labs  Lab 11/28/19 0927  BNP 188.0*     DDimer No results for input(s): DDIMER in the last 168 hours.   Radiology    No results found.  Cardiac Studies   LHC (11/04/2019, prelim): 1. Significant two-vessel coronary artery disease with diffuse LAD disease of up to 70-80% in the mid vessel and chronic subtotal occlusion of the mid RCA with left-to-right collaterals. 2. Moderately elevated left heart, right heart, and pulmonary artery pressures. 3. Low normal Fick cardiac output/index.  TTE (11/29/2019): 1. Left ventricular ejection fraction, by estimation, is 25 to 30%. The  left ventricle has severely decreased function. The left ventricle  demonstrates global hypokinesis. The left ventricular internal cavity size  was mildly dilated. There is mild left  ventricular hypertrophy. Left ventricular diastolic parameters are  consistent  with Grade I diastolic dysfunction (impaired relaxation).  2. Right ventricular systolic function is normal. The right ventricular  size is normal. Tricuspid regurgitation signal is inadequate for assessing  PA pressure.  3. Left atrial size was mildly dilated.  4. The inferior vena cava is normal in size with greater than 50%  respiratory variability, suggesting right atrial pressure of 3 mmHg.   Patient Profile     69 y.o. female admitted with acute on chronic HFrEF and found to have significant 2-vessel CAD by cath.  Assessment & Plan    Acute on chronic HFrEF: Volume status has improved quite a bit clinically, though catheterization demonstrates that left and right heart pressures are still moderately elevated.  Switch furosemide back to 40 mg IV BID; based on urine output, consider transitioning back to PO tomorrow.  Continue current doses of losartan and carvedilol.  We could consider transitioning to [facility] in the future.  Add spironolactone 25 mg daily.  Coronary artery disease: No angina reported but likely leading to ischemic cardiomyopathy.  Diffuse nature of disease and small size of mid/distal LAD and distal RCA/branches make PCI and CABG difficult.  I favor medical management for the time being.  Continue aspirin and atorvastatin for secondary prevention.  Continue carvedilol, as above.  Defer revascularization at this time.  We will need to readdress as an outpatient after optimization of heart failure given limited options.  Hypertension: BP improved from admission.  Continue current doses of losartan and carvedilol.  Add spironolactone of optimize HF therapy.  Disposition: Depending on urine output and respiratory status, discharge as soon as tomorrow may be feasible.    For questions or updates, please contact [place] Please consult [email or web address] for contact info under [facility].     Signed, [name], MD  12/02/2019, 12:46 PM

## 2019-12-02 NOTE — Interval H&P Note (Signed)
History and Physical Interval Note:  12/02/2019 7:38 AM  [name] [name]  has presented today for cardiac catheterization, with the diagnosis of acute systolic congestive heart failure.  The various methods of treatment have been discussed with the patient and family. After consideration of risks, benefits and other options for treatment, the patient has consented to  Procedure(s): RIGHT/LEFT HEART CATH AND CORONARY ANGIOGRAPHY (N/A) as a surgical intervention.  The patient's history has been reviewed, patient examined, no change in status, stable for surgery.  I have reviewed the patient's chart and labs.  Questions were answered to the patient's satisfaction.    Cath Lab Visit (complete for each Cath Lab visit)  Clinical Evaluation Leading to the Procedure:   ACS: No.  Non-ACS:    Anginal/Heart Failure Classification: NYHA IV  Anti-ischemic medical therapy: Minimal Therapy (1 class of medications)  Non-Invasive Test Results: No non-invasive testing performed (LVEF < 35% by echo -> high risk)  Prior CABG: No previous CABG  Jolan Mealor

## 2019-12-02 NOTE — Brief Op Note (Signed)
BRIEF CARDIAC CATHETERIZATION NOTE  12/02/2019  9:15 AM  PATIENT:  [name] [name]  69 y.o. female  PRE-OPERATIVE DIAGNOSIS:  acute systolic congestive heart failure  POST-OPERATIVE DIAGNOSIS:  Same  PROCEDURE:  Procedure(s): RIGHT/LEFT HEART CATH AND CORONARY ANGIOGRAPHY (N/A)  SURGEON:  Surgeon(s) and Role:    [name], MD - Primary  FINDINGS: 1. Significant two-vessel coronary artery disease with diffuse LAD disease of up to 70-80% in the mid vessel and chronic subtotal occlusion of the mid RCA with left-to-right collaterals. 2. Moderately elevated left heart, right heart, and pulmonary artery pressures. 3. Low normal Fick cardiac output/index.  RECOMMENDATIONS: 1. Escalate diuresis and optimize evidence-based heart failure therapy. 2. Medical management of coronary artery disease.  Small vessel size and diffuse disease involving LAD and RCA make percutaneous and surgical revascularization suboptimal.  [name], MD [facility]

## 2019-12-03 ENCOUNTER — Encounter: Payer: Self-pay | Admitting: Cardiology

## 2019-12-03 LAB — BASIC METABOLIC PANEL
Anion gap: 10 (ref 5–15)
BUN: 26 mg/dL — ABNORMAL HIGH (ref 8–23)
CO2: 31 mmol/L (ref 22–32)
Calcium: 9.3 mg/dL (ref 8.9–10.3)
Chloride: 96 mmol/L — ABNORMAL LOW (ref 98–111)
Creatinine, Ser: 0.58 mg/dL (ref 0.44–1.00)
GFR calc Af Amer: >60 mL/min (ref >60)
GFR calc non Af Amer: >60 mL/min (ref >60)
Glucose, Bld: 116 mg/dL — ABNORMAL HIGH (ref 70–99)
Potassium: 3.7 mmol/L (ref 3.5–5.1)
Sodium: 137 mmol/L (ref 135–145)

## 2019-12-03 LAB — CBC
HCT: 43.8 % (ref 36.0–46.0)
Hemoglobin: 14 g/dL (ref 12.0–15.0)
MCH: 28.5 pg (ref 26.0–34.0)
MCHC: 32 g/dL (ref 30.0–36.0)
MCV: 89.2 fL (ref 80.0–100.0)
Platelets: 413 10*3/uL — ABNORMAL HIGH (ref 150–400)
RBC: 4.91 MIL/uL (ref 3.87–5.11)
RDW: 13.6 % (ref 11.5–15.5)
WBC: 9.4 10*3/uL (ref 4.0–10.5)
nRBC: 0 % (ref 0.0–0.2)

## 2019-12-03 LAB — MAGNESIUM: Magnesium: 2.2 mg/dL (ref 1.7–2.4)

## 2019-12-03 MED ORDER — SPIRONOLACTONE 25 MG PO TABS: 25.0000 mg | ORAL_TABLET | Freq: Every day | ORAL | 0 refills | Status: DC

## 2019-12-03 MED ORDER — FUROSEMIDE 40 MG PO TABS: 40.0000 mg | ORAL_TABLET | Freq: Every day | ORAL | 0 refills | Status: DC

## 2019-12-03 MED ORDER — ATORVASTATIN CALCIUM 40 MG PO TABS: 40.0000 mg | ORAL_TABLET | Freq: Every day | ORAL | 0 refills | Status: DC

## 2019-12-03 MED ORDER — CARVEDILOL 6.25 MG PO TABS: 6.2500 mg | ORAL_TABLET | Freq: Two times a day (BID) | ORAL | 0 refills | Status: DC

## 2019-12-03 NOTE — Progress Notes (Signed)
Progress Note  Patient Name: [name] [name] Date of Encounter: 12/03/2019  Primary Cardiologist: Kathlyn Sacramento, MD   Subjective   Patient states feeling a lot better compared to admission.  Breathing better two.  Inpatient Medications    Scheduled Meds: . aspirin EC  81 mg Oral Daily  . atorvastatin  40 mg Oral q1800  . carvedilol  6.25 mg Oral BID WC  . enoxaparin (LOVENOX) injection  40 mg Subcutaneous Q24H  . fenofibrate  160 mg Oral Daily  . FLUoxetine  20 mg Oral Daily  . furosemide  40 mg Intravenous BID  . losartan  100 mg Oral Daily  . sodium chloride flush  3 mL Intravenous Q12H  . sodium chloride flush  3 mL Intravenous Q12H  . spironolactone  25 mg Oral Daily   Continuous Infusions: . sodium chloride    . sodium chloride     PRN Meds: sodium chloride, sodium chloride, acetaminophen, diclofenac Sodium, ondansetron (ZOFRAN) IV, sodium chloride flush, sodium chloride flush, tiZANidine   Vital Signs    Vitals:   12/03/19 0526 12/03/19 0747 12/03/19 0748 12/03/19 1226  BP: (!) 141/67 135/78  (!) 141/80  Pulse: 76 74  80  Resp: 19 18  18   Temp: 98.3 F (36.8 C) 98.2 F (36.8 C)  99.2 F (37.3 C)  TempSrc: Oral Oral  Oral  SpO2: 95% 92% 95% 93%  Weight: 93.5 kg     Height:        Intake/Output Summary (Last 24 hours) at 12/03/2019 1254 Last data filed at 12/03/2019 0958 Gross per 24 hour  Intake 720 ml  Output 1300 ml  Net -580 ml   Last 3 Weights 12/03/2019 12/02/2019 12/02/2019  Weight (lbs) 206 lb 1.6 oz 204 lb 12.9 oz 204 lb 12.8 oz  Weight (kg) 93.486 kg 92.9 kg 92.897 kg      Telemetry    Sinus rhythm, heart rate 73.- Personally Reviewed  ECG    No new ECG obtained- Personally Reviewed  Physical Exam   GEN: No acute distress.   Neck: No JVD Cardiac: RRR, no murmurs, rubs, or gallops.  Respiratory: Clear to auscultation bilaterally. GI: Soft, nontender, non-distended  MS: No edema; No deformity. Neuro:  Nonfocal  Psych: Normal  affect   Labs    High Sensitivity Troponin:   Recent Labs  Lab 11/28/19 0927 11/28/19 1312 11/29/19 0909 11/29/19 1057  TROPONINIHS 52* 256* 415* 336*      Chemistry Recent Labs  Lab 11/28/19 0927 11/29/19 0558 12/01/19 0603 12/02/19 0449 12/03/19 0507  NA 140   < > 137 138 137  K 3.8   < > 4.3 4.1 3.7  CL 103   < > 95* 97* 96*  CO2 26   < > 31 31 31   GLUCOSE 129*   < > 114* 104* 116*  BUN 13   < > 34* 36* 26*  CREATININE 0.64   < > 0.74 0.70 0.58  CALCIUM 9.1   < > 9.6 9.5 9.3  PROT 7.7  --   --   --   --   ALBUMIN 4.2  --   --   --   --   AST 42*  --   --   --   --   ALT 44  --   --   --   --   ALKPHOS 81  --   --   --   --   BILITOT 0.8  --   --   --   --  GFRNONAA >60   < > >60 >60 >60  GFRAA >60   < > >60 >60 >60  ANIONGAP 11   < > 11 10 10    < > = values in this interval not displayed.     Hematology Recent Labs  Lab 12/01/19 0603 12/02/19 0449 12/03/19 0507  WBC 11.3* 9.4 9.4  RBC 4.68 4.80 4.91  HGB 13.4 13.6 14.0  HCT 41.7 42.7 43.8  MCV 89.1 89.0 89.2  MCH 28.6 28.3 28.5  MCHC 32.1 31.9 32.0  RDW 14.0 13.6 13.6  PLT 416* 399 413*    BNP Recent Labs  Lab 11/28/19 0927  BNP 188.0*     DDimer No results for input(s): DDIMER in the last 168 hours.   Radiology    CARDIAC CATHETERIZATION  Result Date: 12/02/2019 Conclusions: 1. Significant two-vessel coronary artery disease with diffuse LAD disease of up to 70-80% in the mid/distal vessel and chronic subtotal occlusion of the mid RCA with left-to-right collaterals. 2. Moderately elevated left heart, right heart, and pulmonary artery pressures. 3. Low normal Fick cardiac output/index.  Recommendations: 1. Escalate diuresis and optimize evidence-based heart failure therapy. 2. Medical management of coronary artery disease.  Small vessel size and diffuse disease involving LAD and RCA make percutaneous and surgical revascularization suboptimal. [name] Bush, MD [facility]    Cardiac  Studies   2D Echo 11/29/2019: 1. Left ventricular ejection fraction, by estimation, is 25 to 30%. The  left ventricle has severely decreased function. The left ventricle  demonstrates global hypokinesis. The left ventricular internal cavity size  was mildly dilated. There is mild left  ventricular hypertrophy. Left ventricular diastolic parameters are  consistent with Grade I diastolic dysfunction (impaired relaxation).  2. Right ventricular systolic function is normal. The right ventricular  size is normal. Tricuspid regurgitation signal is inadequate for assessing  PA pressure.  3. Left atrial size was mildly dilated.  4. The inferior vena cava is normal in size with greater than 50%  respiratory variability, suggesting right atrial pressure of 3 mmHg.  CARDIAC CATHETERIZATION  Result Date: 12/02/2019 Conclusions: 1. Significant two-vessel coronary artery disease with diffuse LAD disease of up to 70-80% in the mid/distal vessel and chronic subtotal occlusion of the mid RCA with left-to-right collaterals. 2. Moderately elevated left heart, right heart, and pulmonary artery pressures. 3. Low normal Fick cardiac output/index.  Recommendations: 1. Escalate diuresis and optimize evidence-based heart failure therapy. 2. Medical management of coronary artery disease.  Small vessel size and diffuse disease involving LAD and RCA make percutaneous and surgical revascularization suboptimal. [name] Bush, MD [facility]   Patient Profile     69 y.o. female with history of CAD, heart failure reduced EF admitted with acute systolic heart failure.  Status post left heart cath showing diffuse LAD disease in the mid to distal vessel and chronic subtotal occlusion of the RCA.  Assessment & Plan    1.  CAD, LAD and RCA disease -No suitable targets for revascularization -Medical management with aspirin, Plavix, statin   2.  Heart failure reduced ejection fraction -Appears euvolemic -Continue  Coreg, losartan, Aldactone. -Start Lasix p.o. 40 mg daily. -Entresto initiation can be performed as outpatient.      Signed, [name] Sable, MD  12/03/2019, 12:54 PM

## 2019-12-03 NOTE — Evaluation (Signed)
Physical Therapy Evaluation Patient Details Name: [name] [name] MRN: [id number] DOB: 02/14/1951 Today's Date: 12/03/2019   History of Present Illness  Pt is a 69 y.o. female presenting to hospital 11/28/19 with increasing SOB x2 weeks.  Pt admitted with acute on chronic diastolic dysfunction CHF, hypertensive urgency, elevated troponin (likely d/t demand ischemia), and acute on chronic respiratory failure.  Pt s/p cardiac cath 12/02/19 via radial and brachial R UE.  PMH includes htn, HLD, CAD, HF, L breast CA s/p mastectomy 2004, and vertigo.  Clinical Impression  Prior to hospital admission, pt was independent and lives with 2 close friends; also has family that lives closeby.  Currently pt is modified independent with bed mobility; independent with transfers; and CGA with ambulating 120 feet (no AD)--mild increased B lateral sway and decreased B LE step length noted but no loss of balance noted with sessions activities.  Limited distance ambulating d/t fatigue, generalized weakness, and decreased activity tolerance; HR 87-89 bpm and O2 sats 94% or greater on room air during sessions activities.  Educated pt on pacing and activity modification at home and also asking for assist as needed: pt verbalizing understanding and reporting she has 24/7 assist as needed.  Pt also educated on R UE precautions s/p cardiac cath and pt demonstrating and verbalizing good understanding.  Pt would benefit from skilled PT to address noted impairments and functional limitations (see below for any additional details).  Upon hospital discharge, pt would benefit from [place] (nurse and care management notified).    Follow Up Recommendations Home health PT    Equipment Recommendations  None recommended by PT    Recommendations for Other Services       Precautions / Restrictions Precautions Precautions: Fall Restrictions Weight Bearing Restrictions: Yes Other Position/Activity Restrictions: R UE precautions s/p  cardiac cath (R radial and brachial)      Mobility  Bed Mobility Overal bed mobility: Modified Independent             General bed mobility comments: HOB elevated; no difficulties noted  Transfers Overall transfer level: Independent Equipment used: None             General transfer comment: steady safe transfers noted  Ambulation/Gait Ambulation/Gait assistance: Min guard Gait Distance (Feet): 120 Feet Assistive device: None   Gait velocity: decreased   General Gait Details: decreased B LE step length; mild increased B lateral sway; no loss of balance noted  Stairs Stairs: (pt declined and reports plan to use ramp at home)          Wheelchair Mobility    Modified Rankin (Stroke Patients Only)       Balance Overall balance assessment: Needs assistance Sitting-balance support: No upper extremity supported;Feet supported Sitting balance-Leahy Scale: Normal Sitting balance - Comments: steady sitting reaching outside BOS   Standing balance support: No upper extremity supported;During functional activity Standing balance-Leahy Scale: Good Standing balance comment: no loss of balance noted with ambulation                             Pertinent Vitals/Pain Pain Assessment: No/denies pain Pain Score: 0-No pain Pain Intervention(s): Limited activity within patient's tolerance;Monitored during session;Repositioned    Home Living Family/patient expects to be discharged to:: Private residence Living Arrangements: Non-relatives/Friends(2 close friends) Available Help at Discharge: Friend(s);Family;Available 24 hours/day Type of Home: House Home Access: Ramped entrance;Stairs to enter Entrance Stairs-Rails: Right;Left Entrance Stairs-Number of Steps: 4 Home Layout: One  level Home Equipment: Other (comment);Walker - 2 wheels;Cane - single point;Wheelchair - manual(walking stick)      Prior Function Level of Independence: Independent          Comments: Pt reports no falls in past 6 months.     Hand Dominance        Extremity/Trunk Assessment   Upper Extremity Assessment Upper Extremity Assessment: Overall WFL for tasks assessed    Lower Extremity Assessment Lower Extremity Assessment: Overall WFL for tasks assessed    Cervical / Trunk Assessment Cervical / Trunk Assessment: Normal  Communication   Communication: No difficulties  Cognition Arousal/Alertness: Awake/alert Behavior During Therapy: WFL for tasks assessed/performed Overall Cognitive Status: Within Functional Limits for tasks assessed                                        General Comments General comments (skin integrity, edema, etc.): No drainage noted R UE radial and brachial cath sites.  Nursing cleared pt for participation in physical therapy.  Pt agreeable to PT session.    Exercises     Assessment/Plan    PT Assessment Patient needs continued PT services  PT Problem List Decreased strength;Decreased activity tolerance;Decreased balance;Decreased mobility;Decreased skin integrity;Decreased knowledge of precautions       PT Treatment Interventions DME instruction;Gait training;Stair training;Functional mobility training;Therapeutic activities;Therapeutic exercise;Balance training;Patient/family education    PT Goals (Current goals can be found in the Care Plan section)  Acute Rehab PT Goals Patient Stated Goal: to go home PT Goal Formulation: With patient Time For Goal Achievement: 12/17/19 Potential to Achieve Goals: Good    Frequency Min 2X/week   Barriers to discharge        Co-evaluation               AM-PAC PT "6 Clicks" Mobility  Outcome Measure Help needed turning from your back to your side while in a flat bed without using bedrails?: None Help needed moving from lying on your back to sitting on the side of a flat bed without using bedrails?: None Help needed moving to and from a bed to a chair  (including a wheelchair)?: A Little Help needed standing up from a chair using your arms (e.g., wheelchair or bedside chair)?: A Little Help needed to walk in hospital room?: A Little Help needed climbing 3-5 steps with a railing? : A Lot 6 Click Score: 19    End of Session Equipment Utilized During Treatment: Gait belt Activity Tolerance: Patient limited by fatigue Patient left: in bed;with call bell/phone within reach;with bed alarm set Nurse Communication: Mobility status;Precautions PT Visit Diagnosis: Other abnormalities of gait and mobility (R26.89);Muscle weakness (generalized) (M62.81);Difficulty in walking, not elsewhere classified (R26.2)    Time: [id number] PT Time Calculation (min) (ACUTE ONLY): 26 min   Charges:   PT Evaluation $PT Eval Low Complexity: 1 Low PT Treatments $Therapeutic Exercise: 8-22 mins        Leitha Bleak, PT 12/03/19, 4:29 PM

## 2019-12-03 NOTE — Discharge Summary (Signed)
Physician Discharge Summary  [name] [name] [id number] DOB: Oct 31, 1950 DOA: 11/28/2019  PCP: Owens Loffler, MD  Admit date: 11/28/2019 Discharge date: 12/03/2019  Admitted From: home Disposition: home w/ home health   Recommendations for Outpatient Follow-up:  1. Follow up with PCP in 1-2 weeks 2. F/u cardio in 1 week   Home Health: yes Equipment/Devices  Discharge Condition: stable CODE STATUS: full Diet recommendation: Heart Healthy  Brief/Interim Summary: HPI was taken from Dr. Francine Graven: [name] [name] is a 69 y.o. female with medical history significant for chronic diastolic dysfunction CHF, coronary artery disease, hypertension and dyslipidemia who presents to the emergency room for evaluation of shortness of breath that has progressively worsened over the last 2 weeks.  Initially her shortness of breath was with exertion, but now she is short of breath at rest.  Shortness of breath is associated with a nonproductive cough.  She denies having any leg swelling, no chest pain, nausea, vomiting, fever, chills, diaphoresis or palpitations.  She has not had her Covid vaccination  ED Course: Patient presented to the emergency room for evaluation of shortness of breath.  Upon arrival to the ER she was noted to be tachycardic and tachypneic with significant elevated blood pressure, 169/112.  She was noted to have increased work of breathing and Rales on exam.  Chest x-ray was suggestive of CHF and patient was placed on noninvasive mechanical ventilation. She also received a dose of nitroglycerin and Lasix.  She will be admitted to the hospital for further evaluation  Hospital Course from Dr. Lenna Sciara. Jimmye Norman 12/03/19: Pt was found to have acute combined CHF exacerbation that was treated w/ lasix, losartan, coreg & spironolactone as per cardio. Pt did require supplemental oxygen while inpatient but was able to weaned off prior to d/c. Of note, pt had a cardiac cath w/ diffuse disease for which  cardio recommended medical management only has stents were unable to be placed. Also, pt has hx of medication noncompliance. Furthermore, PT saw the pt and recommended home health. Home health was set up by CM prior to d/c. For more information please see previous progress notes.  Discharge Diagnoses:  Principal Problem:   Acute on chronic diastolic CHF (congestive heart failure) (HCC) Active Problems:   Hypertensive urgency   Elevated troponin   Depression   Acute on chronic respiratory failure with hypoxia (HCC)   CHF (congestive heart failure), NYHA class I, acute on chronic, diastolic (HCC)   Acute systolic congestive heart failure (HCC)   Acute hypoxic respiratory failure: likely secondary to acute combined CHF exacerbation. Resolved  Acute combined CHF exacerbation: TTE during this admission showed severely reduced LVEF 25-30% with grade I diastolic dysfunction. Continue lasix, losartan, coreg & spironolactone  CAD: w/ ischemic cardiomyopathy. Cardiac cath shows diffuse nature of disease and small size of mid/distal LAD and distal RCA/branches make PCI and CABG difficult. Continue aspirin and atorvastatin for secondary prevention. Continue carvedilol.  Hypertensive urgency: resolved. Likely secondary to medication noncompliance Continue losartan, coreg, lasix & spironolactone   Elevated troponin: likely due to demand ischemia from CHF exacerbation. No CP.  UTI ruled out: has pyuria but no urinary symptoms at all.  Thrombocytosis: etiology unclear, likely reactive. Will continue to monitor   Discharge Instructions  Discharge Instructions    Diet - low sodium heart healthy   Complete by: As directed    Discharge instructions   Complete by: As directed    F/u PCP in 1-2 weeks; F/u cardio in 1 week  Increase activity slowly   Complete by: As directed      Allergies as of 12/03/2019      Reactions   Sulfur Rash   Ace Inhibitors Cough   Codeine Other (See Comments)    Reation:  Hallucinations    Morphine Other (See Comments)   Reaction:  Hallucinations    Sulfonamide Derivatives Rash      Medication List    TAKE these medications   aspirin EC 81 MG tablet Take 81 mg by mouth daily.   atorvastatin 40 MG tablet Commonly known as: LIPITOR Take 1 tablet (40 mg total) by mouth daily at 6 PM.   Calcium 600+D 600-400 MG-UNIT tablet Generic drug: Calcium Carbonate-Vitamin D Take 1 tablet by mouth 2 (two) times daily.   carvedilol 6.25 MG tablet Commonly known as: COREG Take 1 tablet (6.25 mg total) by mouth 2 (two) times daily with a meal.   fenofibrate 145 MG tablet Commonly known as: TRICOR Take 1 tablet (145 mg total) by mouth daily.   FLUoxetine 20 MG capsule Commonly known as: PROZAC Take 1 capsule by mouth once daily   fluticasone 50 MCG/ACT nasal spray Commonly known as: FLONASE Place 1-2 sprays into both nostrils daily as needed for rhinitis.   furosemide 40 MG tablet Commonly known as: Lasix Take 1 tablet (40 mg total) by mouth daily.   GRAPE SEED EXTRACT PO Take 1 capsule by mouth 2 (two) times daily.   Krill Oil 1000 MG Caps Take 1,000 mg by mouth at bedtime.   losartan 100 MG tablet Commonly known as: COZAAR Take 1 tablet by mouth once daily   meclizine 25 MG tablet Commonly known as: ANTIVERT Take 0.5-1 tablets (12.5-25 mg total) by mouth 3 (three) times daily as needed for dizziness (sedation caution).   meloxicam 15 MG tablet Commonly known as: MOBIC Take 1 tablet (15 mg total) by mouth daily as needed for pain. With food   multivitamin with minerals Tabs tablet Take 1 tablet by mouth daily.   nystatin cream Commonly known as: MYCOSTATIN Apply 1 application topically 2 (two) times daily.   Osteo Bi-Flex Adv Joint Shield Tabs Take 1 tablet by mouth 2 (two) times daily.   PreserVision AREDS 2+Multi Vit Caps Take 1 capsule by mouth daily.   spironolactone 25 MG tablet Commonly known as: ALDACTONE Take 1  tablet (25 mg total) by mouth daily. Start taking on: December 04, 2019   tiZANidine 4 MG tablet Commonly known as: ZANAFLEX TAKE 1 TABLET (4 MG TOTAL) BY MOUTH AT BEDTIME AS NEEDED FOR MUSCLE SPASMS.      Follow-up Information    [place] Follow up on 12/09/2019.   Specialty: Cardiology Why: at 12:30pm. Enter through the [place] entrance Contact information: [place] [address] [place], [place] Follow up.   Specialty: Home Health Services Why: Physical Therapy, Nurse         Allergies  Allergen Reactions  . Sulfur Rash  . Ace Inhibitors Cough  . Codeine Other (See Comments)    Reation:  Hallucinations   . Morphine Other (See Comments)    Reaction:  Hallucinations   . Sulfonamide Derivatives Rash    Consultations:  cardio   Procedures/Studies: CARDIAC CATHETERIZATION  Result Date: 12/02/2019 Conclusions: 1. Significant two-vessel coronary artery disease with diffuse LAD disease of up to 70-80% in the mid/distal vessel and chronic  subtotal occlusion of the mid RCA with left-to-right collaterals. 2. Moderately elevated left heart, right heart, and pulmonary artery pressures. 3. Low normal Fick cardiac output/index.  Recommendations: 1. Escalate diuresis and optimize evidence-based heart failure therapy. 2. Medical management of coronary artery disease.  Small vessel size and diffuse disease involving LAD and RCA make percutaneous and surgical revascularization suboptimal. [name], MD [facility]   DG Chest Portable 1 View  Result Date: 11/28/2019 CLINICAL DATA:  Shortness of breath for 2 weeks EXAM: PORTABLE CHEST 1 VIEW COMPARISON:  CT chest 10/05/2016 FINDINGS: There is bilateral interstitial and patchy alveolar airspace opacities. There is no pleural effusion or pneumothorax. There is stable cardiomegaly. There is no acute osseous  abnormality. IMPRESSION: Bilateral interstitial and patchy alveolar airspace opacities concerning for pulmonary edema versus multilobar pneumonia. Electronically Signed   By: Kathreen Devoid   On: 11/28/2019 09:48   ECHOCARDIOGRAM COMPLETE  Result Date: 11/30/2019    ECHOCARDIOGRAM REPORT   Patient Name:   [name] [name] Date of Exam: 11/29/2019 Medical Rec #:  [id number]       Height:       64.0 in Accession #:    [id number]      Weight:       206.1 lb Date of Birth:  1951-07-27      BSA:          1.982 m Patient Age:    72 years        BP:           113/73 mmHg Patient Gender: F               HR:           79 bpm. Exam Location:  [facility] Procedure: 2D Echo Indications:     CHF 428.31/ I50.31  History:         Patient has no prior history of Echocardiogram examinations.  Sonographer:     Arville Go RDCS Referring Phys:  [id number] AGBATA Diagnosing Phys: Ida Rogue MD IMPRESSIONS  1. Left ventricular ejection fraction, by estimation, is 25 to 30%. The left ventricle has severely decreased function. The left ventricle demonstrates global hypokinesis. The left ventricular internal cavity size was mildly dilated. There is mild left ventricular hypertrophy. Left ventricular diastolic parameters are consistent with Grade I diastolic dysfunction (impaired relaxation).  2. Right ventricular systolic function is normal. The right ventricular size is normal. Tricuspid regurgitation signal is inadequate for assessing PA pressure.  3. Left atrial size was mildly dilated.  4. The inferior vena cava is normal in size with greater than 50% respiratory variability, suggesting right atrial pressure of 3 mmHg. FINDINGS  Left Ventricle: Left ventricular ejection fraction, by estimation, is 25 to 30%. The left ventricle has severely decreased function. The left ventricle demonstrates global hypokinesis. The left ventricular internal cavity size was mildly dilated. There is mild left ventricular hypertrophy. Left  ventricular diastolic parameters are consistent with Grade I diastolic dysfunction (impaired relaxation). Right Ventricle: The right ventricular size is normal. No increase in right ventricular wall thickness. Right ventricular systolic function is normal. Tricuspid regurgitation signal is inadequate for assessing PA pressure. Left Atrium: Left atrial size was mildly dilated. Right Atrium: Right atrial size was normal in size. Pericardium: There is no evidence of pericardial effusion. Mitral Valve: The mitral valve is normal in structure. Normal mobility of the mitral valve leaflets. Mild mitral valve regurgitation. No evidence of mitral valve stenosis. Tricuspid Valve: The tricuspid valve  is normal in structure. Tricuspid valve regurgitation is not demonstrated. No evidence of tricuspid stenosis. Aortic Valve: The aortic valve is normal in structure. Aortic valve regurgitation is not visualized. No aortic stenosis is present. Aortic valve mean gradient measures 5.0 mmHg. Aortic valve peak gradient measures 9.7 mmHg. Aortic valve area, by VTI measures 1.64 cm. Pulmonic Valve: The pulmonic valve was normal in structure. Pulmonic valve regurgitation is not visualized. No evidence of pulmonic stenosis. Aorta: The aortic root is normal in size and structure. Venous: The inferior vena cava is normal in size with greater than 50% respiratory variability, suggesting right atrial pressure of 3 mmHg. IAS/Shunts: No atrial level shunt detected by color flow Doppler.  LEFT VENTRICLE PLAX 2D LVIDd:         6.14 cm      Diastology LVIDs:         5.45 cm      LV e' medial:   3.37 cm/s LV PW:         1.22 cm      LV E/e' medial: 17.0 LV IVS:        1.39 cm LVOT diam:     2.00 cm LV SV:         46 LV SV Index:   23 LVOT Area:     3.14 cm  LV Volumes (MOD) LV vol d, MOD A2C: 188.0 ml LV vol d, MOD A4C: 148.0 ml LV vol s, MOD A2C: 126.0 ml LV vol s, MOD A4C: 94.6 ml LV SV MOD A2C:     62.0 ml LV SV MOD A4C:     148.0 ml LV SV MOD  BP:      67.5 ml RIGHT VENTRICLE RV Basal diam:  1.81 cm RV S prime:     14.40 cm/s TAPSE (M-mode): 1.7 cm LEFT ATRIUM             Index       RIGHT ATRIUM          Index LA diam:        4.60 cm 2.32 cm/m  RA Area:     9.17 cm LA Vol (A2C):   47.1 ml 23.77 ml/m RA Volume:   16.50 ml 8.33 ml/m LA Vol (A4C):   23.8 ml 12.01 ml/m LA Biplane Vol: 35.4 ml 17.87 ml/m  AORTIC VALVE                    PULMONIC VALVE AV Area (Vmax):    1.76 cm     PV Vmax:       0.95 m/s AV Area (Vmean):   1.60 cm     PV Peak grad:  3.6 mmHg AV Area (VTI):     1.64 cm AV Vmax:           156.00 cm/s AV Vmean:          105.000 cm/s AV VTI:            0.282 m AV Peak Grad:      9.7 mmHg AV Mean Grad:      5.0 mmHg LVOT Vmax:         87.20 cm/s LVOT Vmean:        53.600 cm/s LVOT VTI:          0.147 m LVOT/AV VTI ratio: 0.52  AORTA Ao Root diam: 2.90 cm Ao Asc diam:  3.40 cm MITRAL VALVE MV Area (PHT): 3.91 cm    SHUNTS  MV Decel Time: 194 msec    Systemic VTI:  0.15 m MV E velocity: 57.20 cm/s  Systemic Diam: 2.00 cm MV A velocity: 86.30 cm/s MV E/A ratio:  0.66 Ida Rogue MD Electronically signed by Ida Rogue MD Signature Date/Time: 11/30/2019/8:50:21 AM    Final         Subjective: Pt c/o malaise    Discharge Exam: Vitals:   12/03/19 1226 12/03/19 1609  BP: (!) 141/80 122/67  Pulse: 80 79  Resp: 18 18  Temp: 99.2 F (37.3 C) 98.2 F (36.8 C)  SpO2: 93% 95%   Vitals:   12/03/19 0747 12/03/19 0748 12/03/19 1226 12/03/19 1609  BP: 135/78  (!) 141/80 122/67  Pulse: 74  80 79  Resp: 18  18 18   Temp: 98.2 F (36.8 C)  99.2 F (37.3 C) 98.2 F (36.8 C)  TempSrc: Oral  Oral Oral  SpO2: 92% 95% 93% 95%  Weight:      Height:        General: Pt is alert, awake, not in acute distress Cardiovascular: S1/S2 +, no rubs, no gallops Respiratory: diminished breath sounds b/l. No wheezes  Abdominal: Soft, NT, ND, bowel sounds + Extremities:  no cyanosis    The results of significant diagnostics from  this hospitalization (including imaging, microbiology, ancillary and laboratory) are listed below for reference.     Microbiology: Recent Results (from the past 240 hour(s))  SARS CORONAVIRUS 2 (TAT 6-24 HRS) Nasopharyngeal Nasopharyngeal Swab     Status: None   Collection Time: 11/28/19  9:29 PM   Specimen: Nasopharyngeal Swab  Result Value Ref Range Status   SARS Coronavirus 2 NEGATIVE NEGATIVE Final    Comment: (NOTE) SARS-CoV-2 target nucleic acids are NOT DETECTED. The SARS-CoV-2 RNA is generally detectable in upper and lower respiratory specimens during the acute phase of infection. Negative results do not preclude SARS-CoV-2 infection, do not rule out co-infections with other pathogens, and should not be used as the sole basis for treatment or other patient management decisions. Negative results must be combined with clinical observations, patient history, and epidemiological information. The expected result is Negative. Fact Sheet for Patients: SugarRoll.be Fact Sheet for Healthcare Providers: https://www.woods-mathews.com/ This test is not yet approved or cleared by the [place] FDA and  has been authorized for detection and/or diagnosis of SARS-CoV-2 by FDA under an Emergency Use Authorization (EUA). This EUA will remain  in effect (meaning this test can be used) for the duration of the COVID-19 declaration under Section 56 4(b)(1) of the Act, 21 U.S.C. section 360bbb-3(b)(1), unless the authorization is terminated or revoked sooner. Performed at [facility], [place] [address] [place], [place] [address]      Labs: BNP (last 3 results) Recent Labs    11/28/19 0927  BNP [id number]*   Basic Metabolic Panel: Recent Labs  Lab 11/29/19 0558 11/29/19 0909 11/30/19 0438 12/01/19 0603 12/02/19 0449 12/03/19 0507  NA 141  --  140 137 138 137  K 4.0  --  4.1 4.3 4.1 3.7  CL 100  --  95* 95* 97* 96*  CO2 31  --  30 31  31 31   GLUCOSE 110*  --  122* 114* 104* 116*  BUN 17  --  19 34* 36* 26*  CREATININE 0.65  --  0.64 0.74 0.70 0.58  CALCIUM 10.4*  --  9.6 9.6 9.5 9.3  MG  --  1.9 1.7 2.0 2.2 2.2   Liver Function Tests: Recent  Labs  Lab 11/28/19 0927  AST 42*  ALT 44  ALKPHOS 81  BILITOT 0.8  PROT 7.7  ALBUMIN 4.2   No results for input(s): LIPASE, AMYLASE in the last 168 hours. No results for input(s): AMMONIA in the last 168 hours. CBC: Recent Labs  Lab 11/28/19 0927 11/30/19 0438 12/01/19 0603 12/02/19 0449 12/03/19 0507  WBC 10.6* 12.2* 11.3* 9.4 9.4  NEUTROABS 8.1*  --   --   --   --   HGB 14.1 13.9 13.4 13.6 14.0  HCT 44.4 43.1 41.7 42.7 43.8  MCV 90.6 88.9 89.1 89.0 89.2  PLT 395 422* 416* 399 413*   Cardiac Enzymes: No results for input(s): CKTOTAL, CKMB, CKMBINDEX, TROPONINI in the last 168 hours. BNP: Invalid input(s): POCBNP CBG: No results for input(s): GLUCAP in the last 168 hours. D-Dimer No results for input(s): DDIMER in the last 72 hours. Hgb A1c No results for input(s): HGBA1C in the last 72 hours. Lipid Profile No results for input(s): CHOL, HDL, LDLCALC, TRIG, CHOLHDL, LDLDIRECT in the last 72 hours. Thyroid function studies No results for input(s): TSH, T4TOTAL, T3FREE, THYROIDAB in the last 72 hours.  Invalid input(s): FREET3 Anemia work up No results for input(s): VITAMINB12, FOLATE, FERRITIN, TIBC, IRON, RETICCTPCT in the last 72 hours. Urinalysis    Component Value Date/Time   COLORURINE YELLOW 06/18/2011 1244   APPEARANCEUR CLOUDY (A) 06/18/2011 1244   LABSPEC 1.013 06/18/2011 1244   PHURINE 7.5 06/18/2011 1244   GLUCOSEU NEGATIVE 06/18/2011 1244   HGBUR TRACE (A) 06/18/2011 1244   BILIRUBINUR NEGATIVE 06/18/2011 [place] 06/18/2011 1244   PROTEINUR NEGATIVE 06/18/2011 1244   UROBILINOGEN 1.0 06/18/2011 1244   NITRITE POSITIVE (A) 06/18/2011 1244   LEUKOCYTESUR MODERATE (A) 06/18/2011 1244   Sepsis Labs Invalid input(s):  PROCALCITONIN,  WBC,  LACTICIDVEN Microbiology Recent Results (from the past 240 hour(s))  SARS CORONAVIRUS 2 (TAT 6-24 HRS) Nasopharyngeal Nasopharyngeal Swab     Status: None   Collection Time: 11/28/19  9:29 PM   Specimen: Nasopharyngeal Swab  Result Value Ref Range Status   SARS Coronavirus 2 NEGATIVE NEGATIVE Final    Comment: (NOTE) SARS-CoV-2 target nucleic acids are NOT DETECTED. The SARS-CoV-2 RNA is generally detectable in upper and lower respiratory specimens during the acute phase of infection. Negative results do not preclude SARS-CoV-2 infection, do not rule out co-infections with other pathogens, and should not be used as the sole basis for treatment or other patient management decisions. Negative results must be combined with clinical observations, patient history, and epidemiological information. The expected result is Negative. Fact Sheet for Patients: SugarRoll.be Fact Sheet for Healthcare Providers: https://www.woods-mathews.com/ This test is not yet approved or cleared by the [place] FDA and  has been authorized for detection and/or diagnosis of SARS-CoV-2 by FDA under an Emergency Use Authorization (EUA). This EUA will remain  in effect (meaning this test can be used) for the duration of the COVID-19 declaration under Section 56 4(b)(1) of the Act, 21 U.S.C. section 360bbb-3(b)(1), unless the authorization is terminated or revoked sooner. Performed at [facility], [place] [address], [place], [place] [address]      Time coordinating discharge: Over 30 minutes  SIGNED:   Wyvonnia Dusky, MD  [facility] 12/03/2019, 4:15 PM Pager   If 7PM-7AM, please contact night-coverage [email or web address]

## 2019-12-04 ENCOUNTER — Telehealth: Payer: Self-pay

## 2019-12-04 NOTE — Telephone Encounter (Signed)
Transition Care Management Follow-up Telephone Call  Date of discharge and from where: 12/03/2019, [facility]  How have you been since you were released from the hospital? Patient states that she feels fine. No complaints at this time  Any questions or concerns? No   Items Reviewed:  Did the pt receive and understand the discharge instructions provided? Yes   Medications obtained and verified? Yes   Any new allergies since your discharge? No   Dietary orders reviewed? Yes  Do you have support at home? Yes   Functional Questionnaire: (I = Independent and D = Dependent) ADLs: I  Bathing/Dressing- I  Meal Prep- I  Eating- I  Maintaining continence- I  Transferring/Ambulation- I  Managing Meds- I  Follow up appointments reviewed:   PCP Hospital f/u appt confirmed? Yes  Scheduled to see Dr. Lorelei Pont on 12/17/2019 @ 11:40 am.  [facility] f/u appt confirmed? Yes  Scheduled to see cardiology   Are transportation arrangements needed? No   If their condition worsens, is the pt aware to call PCP or go to the Emergency Dept.? Yes  Was the patient provided with contact information for the PCP's office or ED? Yes  Was to pt encouraged to call back with questions or concerns? Yes

## 2019-12-05 ENCOUNTER — Telehealth: Payer: Self-pay | Admitting: Family

## 2019-12-05 ENCOUNTER — Telehealth: Payer: Self-pay

## 2019-12-05 DIAGNOSIS — J309 Allergic rhinitis, unspecified: Secondary | ICD-10-CM | POA: Diagnosis not present

## 2019-12-05 DIAGNOSIS — J9611 Chronic respiratory failure with hypoxia: Secondary | ICD-10-CM | POA: Diagnosis not present

## 2019-12-05 DIAGNOSIS — Z95828 Presence of other vascular implants and grafts: Secondary | ICD-10-CM | POA: Diagnosis not present

## 2019-12-05 DIAGNOSIS — E785 Hyperlipidemia, unspecified: Secondary | ICD-10-CM | POA: Diagnosis not present

## 2019-12-05 DIAGNOSIS — I5033 Acute on chronic diastolic (congestive) heart failure: Secondary | ICD-10-CM | POA: Diagnosis not present

## 2019-12-05 DIAGNOSIS — Z9181 History of falling: Secondary | ICD-10-CM | POA: Diagnosis not present

## 2019-12-05 DIAGNOSIS — R748 Abnormal levels of other serum enzymes: Secondary | ICD-10-CM | POA: Diagnosis not present

## 2019-12-05 DIAGNOSIS — Z87891 Personal history of nicotine dependence: Secondary | ICD-10-CM | POA: Diagnosis not present

## 2019-12-05 DIAGNOSIS — F329 Major depressive disorder, single episode, unspecified: Secondary | ICD-10-CM | POA: Diagnosis not present

## 2019-12-05 DIAGNOSIS — I255 Ischemic cardiomyopathy: Secondary | ICD-10-CM | POA: Diagnosis not present

## 2019-12-05 DIAGNOSIS — Z90710 Acquired absence of both cervix and uterus: Secondary | ICD-10-CM | POA: Diagnosis not present

## 2019-12-05 DIAGNOSIS — Z853 Personal history of malignant neoplasm of breast: Secondary | ICD-10-CM | POA: Diagnosis not present

## 2019-12-05 DIAGNOSIS — Z9012 Acquired absence of left breast and nipple: Secondary | ICD-10-CM | POA: Diagnosis not present

## 2019-12-05 DIAGNOSIS — Z7982 Long term (current) use of aspirin: Secondary | ICD-10-CM | POA: Diagnosis not present

## 2019-12-05 DIAGNOSIS — I110 Hypertensive heart disease with heart failure: Secondary | ICD-10-CM | POA: Diagnosis not present

## 2019-12-05 DIAGNOSIS — M1990 Unspecified osteoarthritis, unspecified site: Secondary | ICD-10-CM | POA: Diagnosis not present

## 2019-12-05 DIAGNOSIS — Z8619 Personal history of other infectious and parasitic diseases: Secondary | ICD-10-CM | POA: Diagnosis not present

## 2019-12-05 DIAGNOSIS — I5021 Acute systolic (congestive) heart failure: Secondary | ICD-10-CM | POA: Diagnosis not present

## 2019-12-05 DIAGNOSIS — I2510 Atherosclerotic heart disease of native coronary artery without angina pectoris: Secondary | ICD-10-CM | POA: Diagnosis not present

## 2019-12-05 DIAGNOSIS — M8580 Other specified disorders of bone density and structure, unspecified site: Secondary | ICD-10-CM | POA: Diagnosis not present

## 2019-12-05 NOTE — Telephone Encounter (Signed)
I agree

## 2019-12-05 NOTE — Telephone Encounter (Signed)
Spoke with patient who confirmed her new patient CHF Clinic appointment with [place] on 3/30. She said she is doing well since she got home with no complaints. She is checking her weight every morning, following a low sodium diet, and taking meds without any issues.   Alyse Low, [place]

## 2019-12-05 NOTE — Telephone Encounter (Addendum)
Spoke with Stacy with [facility].  She states the PT orders are for Home Health PT 1x week for 1 week, and 2x week for 2 weeks and then 1 x week for 2 weeks.  Verbal order given as requested.

## 2019-12-05 NOTE — Telephone Encounter (Signed)
Erline Levine, PT with [facility] asking for verbal orders for Home Health PT 1x week for 1 week, and 2x week for 2 weeks. Is this ok?  She can be reached back at  [phone]. Erline Levine states it is ok to leave a message if you cannot reach her.

## 2019-12-08 ENCOUNTER — Telehealth: Payer: Self-pay

## 2019-12-08 DIAGNOSIS — I110 Hypertensive heart disease with heart failure: Secondary | ICD-10-CM | POA: Diagnosis not present

## 2019-12-08 DIAGNOSIS — I5021 Acute systolic (congestive) heart failure: Secondary | ICD-10-CM | POA: Diagnosis not present

## 2019-12-08 DIAGNOSIS — J9611 Chronic respiratory failure with hypoxia: Secondary | ICD-10-CM | POA: Diagnosis not present

## 2019-12-08 DIAGNOSIS — I255 Ischemic cardiomyopathy: Secondary | ICD-10-CM | POA: Diagnosis not present

## 2019-12-08 DIAGNOSIS — I2510 Atherosclerotic heart disease of native coronary artery without angina pectoris: Secondary | ICD-10-CM | POA: Diagnosis not present

## 2019-12-08 DIAGNOSIS — I5033 Acute on chronic diastolic (congestive) heart failure: Secondary | ICD-10-CM | POA: Diagnosis not present

## 2019-12-08 NOTE — Progress Notes (Signed)
Patient ID: [name] [name], female    DOB: 1951/02/21, 69 y.o.   MRN: [id number]  HPI  [name] [name] is a 68 y/o female with a history of CAD, hyperlipidemia, HTN, breast cancer, previous tobacco use and chronic heart failure.   Echo report from 11/29/19 reviewed and showed an EF of 25-30%.  RHC/LHC on 12/02/19 showed: 1. Significant two-vessel coronary artery disease with diffuse LAD disease of up to 70-80% in the mid/distal vessel and chronic subtotal occlusion of the mid RCA with left-to-right collaterals. 2. Moderately elevated left heart, right heart, and pulmonary artery pressures. 3. Low normal Fick cardiac output/index.  Admitted 11/28/19 due to acute HF exacerbation. Cardiology consult obtained. Initially needed IV lasix and then transitioned to oral diuretics. Needed oxygen but was then weaned off of it. Elevated troponin thought to be due to demand ischemia. Discharged after 5 days.   She presents today for her initial visit with a chief complaint of minimal shortness of breath upon moderate exertion. She describes this as chronic in nature having been present for several years. She has associated left arm pain at times along with this. She denies difficulty sleeping, abdominal distention, palpitations, pedal edema, chest pain, dizziness, cough, fatigue or weight gain.   Past Medical History:  Diagnosis Date  . Allergic rhinitis   . Avulsion fracture of distal fibula 3/10   and sprain; Left  . Breast cancer [name] [name]) 2004   Left Breast Cancer  . Coronary artery disease    a. 10/2012 ETT: nl; b. 12/2012 Cath: LM nl, LAD min irregs, D1 min irregs, LCX 30p, RCA 23m, RPDA 40p, EF 50%.  . Diastolic dysfunction    a. 12/2012 Echo: EF 50-55%. No rwma. Gr1 DD. Nl RV fxn. Nl PASP.  [name] Kitchen History of hepatitis B    ?--tested + by [employer]  . HLD (hyperlipidemia)   . HTN (hypertension)   . Osteoarthritis   . Osteopenia    Past Surgical History:  Procedure Laterality Date  . ABDOMINAL  HYSTERECTOMY  1985   endometriosis; anatomy  . BREAST SURGERY  10/2002  . CARDIAC CATHETERIZATION  12/19/12   [name] [name]; EF 50%  . COLONOSCOPY  8/09   Hyperplastic polyp; repeat in 10 years  . HIP FRACTURE SURGERY  06/14/2011   ball replaced ([name] [name])  . MASTECTOMY Left 2004  . RIGHT/LEFT HEART CATH AND CORONARY ANGIOGRAPHY N/A 12/02/2019   Procedure: RIGHT/LEFT HEART CATH AND CORONARY ANGIOGRAPHY;  Surgeon: [name] Bush, MD;  Location: [name];  Service: Cardiovascular;  Laterality: N/A;  . TONSILLECTOMY  1958   Family History  Problem Relation Age of Onset  . Heart attack Father 4  . Heart disease Father   . Sarcoidosis Mother   . Ovarian cancer Other        Aunt  . Diabetes Other        GP  . Breast cancer Sister 64  . Cancer Sister        breast  . Cancer Paternal Aunt        colon/ovarian   Social History   Tobacco Use  . Smoking status: Former Smoker    Packs/day: 0.50    Years: 10.00    Pack years: 5.00    Types: Cigarettes  . Smokeless tobacco: Former Systems developer    Quit date: 01/24/1986  . Tobacco comment: 1989 quit   Substance Use Topics  . Alcohol use: No   Allergies  Allergen Reactions  . Sulfur Rash  .  Ace Inhibitors Cough  . Codeine Other (See Comments)    Reation:  Hallucinations   . Morphine Other (See Comments)    Reaction:  Hallucinations   . Sulfonamide Derivatives Rash   Prior to Admission medications   Medication Sig Start Date End Date Taking? Authorizing Provider  aspirin EC 81 MG tablet Take 81 mg by mouth daily.   Yes [provider]  atorvastatin (LIPITOR) 40 MG tablet Take 1 tablet (40 mg total) by mouth daily at 6 PM. 12/03/19 01/02/20 Yes [name] Dusky, MD  Calcium Carbonate-Vitamin D (CALCIUM 600+D) 600-400 MG-UNIT tablet Take 1 tablet by mouth 2 (two) times daily.   Yes [provider]  carvedilol (COREG) 6.25 MG tablet Take 1 tablet (6.25 mg total) by mouth 2 (two) times daily with a meal. 12/03/19  01/02/20 Yes [name] Dusky, MD  fenofibrate (TRICOR) 145 MG tablet Take 1 tablet (145 mg total) by mouth daily. 08/22/19  Yes Tower, Wynelle Fanny, MD  FLUoxetine (PROZAC) 20 MG capsule Take 1 capsule by mouth once daily 08/12/19  Yes Copland, Frederico Hamman, MD  fluticasone (FLONASE) 50 MCG/ACT nasal spray Place 1-2 sprays into both nostrils daily as needed for rhinitis. 01/10/18  Yes Tower, Wynelle Fanny, MD  furosemide (LASIX) 40 MG tablet Take 1 tablet (40 mg total) by mouth daily. 12/03/19 01/02/20 Yes [name] Dusky, MD  GRAPE SEED EXTRACT PO Take 1 capsule by mouth 2 (two) times daily.    Yes [provider]  [name] [name] Oil 1000 MG CAPS Take 1,000 mg by mouth at bedtime.   Yes [provider]  losartan (COZAAR) 100 MG tablet Take 1 tablet by mouth once daily 08/12/19  Yes Copland, Frederico Hamman, MD  meclizine (ANTIVERT) 25 MG tablet Take 0.5-1 tablets (12.5-25 mg total) by mouth 3 (three) times daily as needed for dizziness (sedation caution). 03/15/18  Yes [name] Ghent, MD  meloxicam (MOBIC) 15 MG tablet Take 1 tablet (15 mg total) by mouth daily as needed for pain. With food 11/05/19  Yes Tower, Wynelle Fanny, MD  Misc Natural Products (OSTEO BI-FLEX ADV JOINT SHIELD) TABS Take 1 tablet by mouth 2 (two) times daily.   Yes [provider]  Multiple Vitamin (MULTIVITAMIN WITH MINERALS) TABS tablet Take 1 tablet by mouth daily.   Yes [provider]  Multiple Vitamins-Minerals (PRESERVISION AREDS 2+MULTI VIT) CAPS Take 1 capsule by mouth daily.   Yes [provider]  nystatin cream (MYCOSTATIN) Apply 1 application topically 2 (two) times daily. 03/18/18  Yes Copland, Frederico Hamman, MD  spironolactone (ALDACTONE) 25 MG tablet Take 1 tablet (25 mg total) by mouth daily. 12/04/19 01/03/20 Yes [name] Dusky, MD  tiZANidine (ZANAFLEX) 4 MG tablet TAKE 1 TABLET (4 MG TOTAL) BY MOUTH AT BEDTIME AS NEEDED FOR MUSCLE SPASMS. 11/24/19 02/22/20 Yes Copland, Frederico Hamman, MD     Review of Systems   Constitutional: Negative for appetite change and fatigue.  HENT: Negative for congestion, rhinorrhea and sore throat.   Eyes: Negative.   Respiratory: Positive for shortness of breath (minimal). Negative for cough.   Cardiovascular: Negative for chest pain, palpitations and leg swelling.  Gastrointestinal: Negative for abdominal distention and abdominal pain.  Endocrine: Negative.   Genitourinary: Negative.   Musculoskeletal: Positive for arthralgias (back of left arm at times). Negative for back pain.  Skin: Negative.   Allergic/Immunologic: Negative.   Neurological: Negative for dizziness and light-headedness.  Hematological: Negative for adenopathy. Does not bruise/bleed easily.  Psychiatric/Behavioral: Negative for dysphoric mood and  sleep disturbance (sleeping on 1 pillow). The patient is not nervous/anxious.    Vitals:   12/09/19 1226  BP: (!) 151/87  Pulse: 85  Resp: 20  SpO2: 93%  Weight: 205 lb (93 kg)  Height: 5\' 4"  (1.626 m)   Wt Readings from Last 3 Encounters:  12/09/19 205 lb (93 kg)  12/03/19 206 lb 1.6 oz (93.5 kg)  11/05/19 210 lb 6 oz (95.4 kg)   [name] Results  Component Value Date   CREATININE 0.58 12/03/2019   CREATININE 0.70 12/02/2019   CREATININE 0.74 12/01/2019    Physical Exam Vitals and nursing note reviewed.  Constitutional:      Appearance: She is well-developed.  HENT:     Head: Normocephalic and atraumatic.  Neck:     Vascular: No JVD.  Cardiovascular:     Rate and Rhythm: Normal rate and regular rhythm.  Pulmonary:     Effort: Pulmonary effort is normal. No respiratory distress.     Breath sounds: No wheezing or rales.  Abdominal:     Palpations: Abdomen is soft.     Tenderness: There is no abdominal tenderness.  Musculoskeletal:     Cervical back: Normal range of motion and neck supple.     Right lower leg: No tenderness. No edema.     Left lower leg: No tenderness. No edema.  Skin:    General: Skin is warm and dry.   Neurological:     General: No focal deficit present.     Mental Status: She is alert and oriented to person, place, and time.  Psychiatric:        Mood and Affect: Mood normal.        Behavior: Behavior normal.     Assessment & Plan:  1: Chronic heart failure with reduced ejection fraction- - NYHA class II - euvolemic today - weighing daily and says that her weight has gradually declined; instructed to call for an overnight weight gain of >2 pounds or a weekly weight gain of >5 pounds - not adding salt and has been reading food labels for sodium content; reviewed the importance of closely following a 2000mg  sodium diet and written dietary information and low sodium cookbook were given to her - will stop her losartan and begin entresto 49/51mg  BID; 30 day voucher given to patient and she filled out/signed [employer] patient assistance paperwork in case her copay is too high; she will call and let us know - saw [facility] cardiology back in 2014 but doesn't have anything scheduled; appointment was made for her to see Dr. Saunders Revel on 12/26/19 - will see if they can check BMP at that visit  - BNP 11/28/19 was 188.0  2: HTN- - BP mildly elevated today but changing losartan to entresto per above - saw PCP (Tower) 11/05/19 & returns next week - BMP 12/03/19 reviewed and showed sodium 137, potassium 3.7, creatinine 0.58 and GFR >60   Patient did not bring her medications nor a list. Each medication was verbally reviewed with the patient and she was encouraged to bring the bottles to every visit to confirm accuracy of list.  Return in 1 month or sooner for any questions/problems before then.

## 2019-12-08 NOTE — Telephone Encounter (Signed)
Otila Kluver nurse with [facility] left v/m requesting verbal orders for [facility] skilled nursing 2 x a wk for 1 wk and 1 x a wk for 5 wks and one prn visit for disease process with CHF.

## 2019-12-09 ENCOUNTER — Other Ambulatory Visit: Payer: Self-pay

## 2019-12-09 ENCOUNTER — Encounter: Payer: Self-pay | Admitting: Family

## 2019-12-09 ENCOUNTER — Ambulatory Visit: Payer: Medicare Other | Attending: Family | Admitting: Family

## 2019-12-09 VITALS — BP 151/87 | HR 85 | Resp 20 | Ht 64.0 in | Wt 205.0 lb

## 2019-12-09 DIAGNOSIS — Z7982 Long term (current) use of aspirin: Secondary | ICD-10-CM | POA: Insufficient documentation

## 2019-12-09 DIAGNOSIS — I5022 Chronic systolic (congestive) heart failure: Secondary | ICD-10-CM | POA: Diagnosis not present

## 2019-12-09 DIAGNOSIS — Z9012 Acquired absence of left breast and nipple: Secondary | ICD-10-CM | POA: Diagnosis not present

## 2019-12-09 DIAGNOSIS — Z882 Allergy status to sulfonamides status: Secondary | ICD-10-CM | POA: Insufficient documentation

## 2019-12-09 DIAGNOSIS — I10 Essential (primary) hypertension: Secondary | ICD-10-CM

## 2019-12-09 DIAGNOSIS — Z885 Allergy status to narcotic agent status: Secondary | ICD-10-CM | POA: Insufficient documentation

## 2019-12-09 DIAGNOSIS — E785 Hyperlipidemia, unspecified: Secondary | ICD-10-CM | POA: Insufficient documentation

## 2019-12-09 DIAGNOSIS — I110 Hypertensive heart disease with heart failure: Secondary | ICD-10-CM | POA: Insufficient documentation

## 2019-12-09 DIAGNOSIS — I509 Heart failure, unspecified: Secondary | ICD-10-CM | POA: Diagnosis present

## 2019-12-09 DIAGNOSIS — M1990 Unspecified osteoarthritis, unspecified site: Secondary | ICD-10-CM | POA: Insufficient documentation

## 2019-12-09 DIAGNOSIS — Z833 Family history of diabetes mellitus: Secondary | ICD-10-CM | POA: Diagnosis not present

## 2019-12-09 DIAGNOSIS — Z8249 Family history of ischemic heart disease and other diseases of the circulatory system: Secondary | ICD-10-CM | POA: Diagnosis not present

## 2019-12-09 DIAGNOSIS — Z888 Allergy status to other drugs, medicaments and biological substances status: Secondary | ICD-10-CM | POA: Diagnosis not present

## 2019-12-09 DIAGNOSIS — Z90710 Acquired absence of both cervix and uterus: Secondary | ICD-10-CM | POA: Insufficient documentation

## 2019-12-09 DIAGNOSIS — I2510 Atherosclerotic heart disease of native coronary artery without angina pectoris: Secondary | ICD-10-CM | POA: Insufficient documentation

## 2019-12-09 DIAGNOSIS — Z87891 Personal history of nicotine dependence: Secondary | ICD-10-CM | POA: Diagnosis not present

## 2019-12-09 DIAGNOSIS — Z853 Personal history of malignant neoplasm of breast: Secondary | ICD-10-CM | POA: Diagnosis not present

## 2019-12-09 DIAGNOSIS — Z79899 Other long term (current) drug therapy: Secondary | ICD-10-CM | POA: Insufficient documentation

## 2019-12-09 DIAGNOSIS — Z803 Family history of malignant neoplasm of breast: Secondary | ICD-10-CM | POA: Diagnosis not present

## 2019-12-09 MED ORDER — SACUBITRIL-VALSARTAN 49-51 MG PO TABS: 1.0000 | ORAL_TABLET | Freq: Two times a day (BID) | ORAL | 5 refills | Status: DC

## 2019-12-09 NOTE — Telephone Encounter (Signed)
[name] [name] given verbal orders via telephone for [facility] skilled nursing 2 x a wk for 1 wk and 1 x a wk for 5 wks and one prn visit for disease process with CHF per Dr. Lorelei Pont.

## 2019-12-09 NOTE — Telephone Encounter (Signed)
Can you help with verbal ok.  Thx!

## 2019-12-09 NOTE — Patient Instructions (Addendum)
Continue weighing daily and call for an overnight weight gain of > 2 pounds or a weekly weight gain of >5 pounds.  Stop taking losartan and begin entresto 49/51mg  as 1 tablet twice daily

## 2019-12-10 DIAGNOSIS — I2510 Atherosclerotic heart disease of native coronary artery without angina pectoris: Secondary | ICD-10-CM | POA: Diagnosis not present

## 2019-12-10 DIAGNOSIS — I110 Hypertensive heart disease with heart failure: Secondary | ICD-10-CM | POA: Diagnosis not present

## 2019-12-10 DIAGNOSIS — I5021 Acute systolic (congestive) heart failure: Secondary | ICD-10-CM | POA: Diagnosis not present

## 2019-12-10 DIAGNOSIS — I255 Ischemic cardiomyopathy: Secondary | ICD-10-CM | POA: Diagnosis not present

## 2019-12-10 DIAGNOSIS — J9611 Chronic respiratory failure with hypoxia: Secondary | ICD-10-CM | POA: Diagnosis not present

## 2019-12-10 DIAGNOSIS — I5033 Acute on chronic diastolic (congestive) heart failure: Secondary | ICD-10-CM | POA: Diagnosis not present

## 2019-12-11 DIAGNOSIS — I2510 Atherosclerotic heart disease of native coronary artery without angina pectoris: Secondary | ICD-10-CM | POA: Diagnosis not present

## 2019-12-11 DIAGNOSIS — J9611 Chronic respiratory failure with hypoxia: Secondary | ICD-10-CM | POA: Diagnosis not present

## 2019-12-11 DIAGNOSIS — I110 Hypertensive heart disease with heart failure: Secondary | ICD-10-CM | POA: Diagnosis not present

## 2019-12-11 DIAGNOSIS — I255 Ischemic cardiomyopathy: Secondary | ICD-10-CM | POA: Diagnosis not present

## 2019-12-11 DIAGNOSIS — I5033 Acute on chronic diastolic (congestive) heart failure: Secondary | ICD-10-CM | POA: Diagnosis not present

## 2019-12-11 DIAGNOSIS — I5021 Acute systolic (congestive) heart failure: Secondary | ICD-10-CM | POA: Diagnosis not present

## 2019-12-11 NOTE — Telephone Encounter (Signed)
[name] [name] OT with [facility] cb and said she did not realize nursing orders already given and for documentation [name] [name] request date, time orders given and name and title of person who gave VO. 12/09/19 at 10:33 am by Butch Penny CMA. [name] [name] said nothing further needed.

## 2019-12-11 NOTE — Telephone Encounter (Signed)
[name] [name], OT with [facility] left message on  Triage line asking for an update on her [place] Orders.   I called her back to tell her we left nursing orders with Otila Kluver on 3-30. If she was needing something different to call us.

## 2019-12-16 DIAGNOSIS — I255 Ischemic cardiomyopathy: Secondary | ICD-10-CM | POA: Diagnosis not present

## 2019-12-16 DIAGNOSIS — I5021 Acute systolic (congestive) heart failure: Secondary | ICD-10-CM | POA: Diagnosis not present

## 2019-12-16 DIAGNOSIS — I2510 Atherosclerotic heart disease of native coronary artery without angina pectoris: Secondary | ICD-10-CM | POA: Diagnosis not present

## 2019-12-16 DIAGNOSIS — I5033 Acute on chronic diastolic (congestive) heart failure: Secondary | ICD-10-CM | POA: Diagnosis not present

## 2019-12-16 DIAGNOSIS — J9611 Chronic respiratory failure with hypoxia: Secondary | ICD-10-CM | POA: Diagnosis not present

## 2019-12-16 DIAGNOSIS — I110 Hypertensive heart disease with heart failure: Secondary | ICD-10-CM | POA: Diagnosis not present

## 2019-12-16 NOTE — Progress Notes (Signed)
Mons[place] Junio T. Brittin Janik, MD, [place] at [facility] [place], [address]  Phone: [phone]  FAX: [place] - 69 y.o. female  MRN [id number]  Date of Birth: June 16, 1951  Date: 12/17/2019  PCP: Owens Loffler, MD  Referral: Owens Loffler, MD  Chief Complaint  Patient presents with  . Hospitalization Follow-up  . Hip Pain    Right    This visit occurred during the SARS-CoV-2 public health emergency.  Safety protocols were in place, including screening questions prior to the visit, additional usage of staff PPE, and extensive cleaning of exam room while observing appropriate contact time as indicated for disinfecting solutions.   Subjective:   [name] [name] is a 69 y.o. very pleasant female patient who presents with the following:  This is a TCM follow-up for the patient's hospital admission.  Admit date: 11/28/2019 Discharge date: 12/03/2019  Briefly, this is a well-known patient she does have a history of some diastolic dysfunction, coronary disease, high cholesterol, high blood pressure.  She had some shortness of breath and went to the emergency room which had been worsening over the last 2 weeks  Her shortness of breath was progressive.  While in the hospital, cardiology found that the patient was in congestive heart failure, combined acute.  She was given diuretics, ARB, beta-blocker, as well as spironolactone.  She did require oxygen while in the hospital.  She had a cardiac catheterization which shows some diffuse disease.  Cardiology has recommended medical management.  While in the hospital, her ejection fraction was 25 to 30%.  She was discharged on Lipitor, aspirin,Coreg, TriCor, Lasix, losartan, as well as spironolactone.  She is at the past month and  Posterior R buttocks. - pulled R side PT at home Mobic and tylenol    Review of  Systems is noted in the HPI, as appropriate  Objective:   BP 120/78   Pulse 88   Temp (!) 97.3 F (36.3 C) (Temporal)   Ht 5\' 4"  (1.626 m)   Wt 205 lb 8 oz (93.2 kg)   SpO2 96%   BMI 35.27 kg/m   GEN: WDWN, NAD, Non-toxic HEENT: Atraumatic, Normocephalic. Neck supple. No masses. CV: RRR, No M/G/R. No JVD. No thrill. No extra heart sounds. PULM: CTA B, no wheezes, crackles, rhonchi. No retractions. No resp. distress. No accessory muscle use. EXTR: No c/c/e NEURO Normal gait.  PSYCH: Normally interactive. Conversant.   She does have some pain in the posterior pelvis on the right.  The paraspinous musculature is nontender.  Trochanteric bursa is nontender.  Neurovascularly intact.  Laboratory and Imaging Data:  Assessment and Plan:     ICD-10-CM   1. Acute on chronic respiratory failure with hypoxia (HCC)  J96.21   2. Acute systolic congestive heart failure (HCC)  I50.21   3. Coronary artery disease involving native coronary artery of native heart without angina pectoris  I25.10   4. Hyperlipidemia LDL goal <70  E78.5   5. Acute back pain, unspecified back location, unspecified back pain laterality  M54.9    She has improved since her poor state of health in the hospital.  She has excellent understanding of what is happened to her with her CHF as well as diffuse coronary disease.  She understands her medications and how to take them.  She is very thankful for the PTs and nursing who is helped her at home  now.  She also has some back pain which is fairly minor and in the posterior aspect of her buttocks region.  I think that this taking some minor anti-inflammatories and some Tylenol as well as resuming normal activity will likely make this resolved.  Follow-up: No follow-ups on file.  No orders of the defined types were placed in this encounter.  There are no discontinued medications. No orders of the defined types were placed in this encounter.   Signed,  Maud Deed.  Harjit Douds, MD   Outpatient Encounter Medications as of 12/17/2019  Medication Sig  . aspirin EC 81 MG tablet Take 81 mg by mouth daily.  [name] Kitchen atorvastatin (LIPITOR) 40 MG tablet Take 1 tablet (40 mg total) by mouth daily at 6 PM.  . Calcium Carbonate-Vitamin D (CALCIUM 600+D) 600-400 MG-UNIT tablet Take 1 tablet by mouth 2 (two) times daily.  . carvedilol (COREG) 6.25 MG tablet Take 1 tablet (6.25 mg total) by mouth 2 (two) times daily with a meal.  . fenofibrate (TRICOR) 145 MG tablet Take 1 tablet (145 mg total) by mouth daily.  [name] Kitchen FLUoxetine (PROZAC) 20 MG capsule Take 1 capsule by mouth once daily  . fluticasone (FLONASE) 50 MCG/ACT nasal spray Place 1-2 sprays into both nostrils daily as needed for rhinitis.  . furosemide (LASIX) 40 MG tablet Take 1 tablet (40 mg total) by mouth daily.  [name] Kitchen GRAPE SEED EXTRACT PO Take 1 capsule by mouth 2 (two) times daily.   Javier Docker Oil 1000 MG CAPS Take 1,000 mg by mouth at bedtime.  . meclizine (ANTIVERT) 25 MG tablet Take 0.5-1 tablets (12.5-25 mg total) by mouth 3 (three) times daily as needed for dizziness (sedation caution).  . meloxicam (MOBIC) 15 MG tablet Take 1 tablet (15 mg total) by mouth daily as needed for pain. With food  . Misc Natural Products (OSTEO BI-FLEX ADV JOINT SHIELD) TABS Take 1 tablet by mouth 2 (two) times daily.  . Multiple Vitamin (MULTIVITAMIN WITH MINERALS) TABS tablet Take 1 tablet by mouth daily.  . Multiple Vitamins-Minerals (PRESERVISION AREDS 2+MULTI VIT) CAPS Take 1 capsule by mouth daily.  [name] Kitchen nystatin cream (MYCOSTATIN) Apply 1 application topically 2 (two) times daily.  . sacubitril-valsartan (ENTRESTO) 49-51 MG Take 1 tablet by mouth 2 (two) times daily.  [name] Kitchen spironolactone (ALDACTONE) 25 MG tablet Take 1 tablet (25 mg total) by mouth daily.  [name] Kitchen tiZANidine (ZANAFLEX) 4 MG tablet TAKE 1 TABLET (4 MG TOTAL) BY MOUTH AT BEDTIME AS NEEDED FOR MUSCLE SPASMS.   No facility-administered encounter medications on file as of 12/17/2019.

## 2019-12-17 ENCOUNTER — Ambulatory Visit (INDEPENDENT_AMBULATORY_CARE_PROVIDER_SITE_OTHER): Payer: Medicare Other | Admitting: Family Medicine

## 2019-12-17 ENCOUNTER — Encounter: Payer: Self-pay | Admitting: Family Medicine

## 2019-12-17 ENCOUNTER — Other Ambulatory Visit: Payer: Self-pay

## 2019-12-17 VITALS — BP 120/78 | HR 88 | Temp 97.3°F | Ht 64.0 in | Wt 205.5 lb

## 2019-12-17 DIAGNOSIS — E785 Hyperlipidemia, unspecified: Secondary | ICD-10-CM | POA: Diagnosis not present

## 2019-12-17 DIAGNOSIS — M549 Dorsalgia, unspecified: Secondary | ICD-10-CM

## 2019-12-17 DIAGNOSIS — I5021 Acute systolic (congestive) heart failure: Secondary | ICD-10-CM

## 2019-12-17 DIAGNOSIS — I2510 Atherosclerotic heart disease of native coronary artery without angina pectoris: Secondary | ICD-10-CM

## 2019-12-17 DIAGNOSIS — J9621 Acute and chronic respiratory failure with hypoxia: Secondary | ICD-10-CM

## 2019-12-19 ENCOUNTER — Other Ambulatory Visit: Payer: Self-pay | Admitting: Family Medicine

## 2019-12-22 DIAGNOSIS — J309 Allergic rhinitis, unspecified: Secondary | ICD-10-CM | POA: Diagnosis not present

## 2019-12-22 DIAGNOSIS — Z90710 Acquired absence of both cervix and uterus: Secondary | ICD-10-CM

## 2019-12-22 DIAGNOSIS — I110 Hypertensive heart disease with heart failure: Secondary | ICD-10-CM | POA: Diagnosis not present

## 2019-12-22 DIAGNOSIS — Z9012 Acquired absence of left breast and nipple: Secondary | ICD-10-CM

## 2019-12-22 DIAGNOSIS — M8580 Other specified disorders of bone density and structure, unspecified site: Secondary | ICD-10-CM | POA: Diagnosis not present

## 2019-12-22 DIAGNOSIS — E785 Hyperlipidemia, unspecified: Secondary | ICD-10-CM | POA: Diagnosis not present

## 2019-12-22 DIAGNOSIS — Z7982 Long term (current) use of aspirin: Secondary | ICD-10-CM

## 2019-12-22 DIAGNOSIS — Z853 Personal history of malignant neoplasm of breast: Secondary | ICD-10-CM

## 2019-12-22 DIAGNOSIS — J9611 Chronic respiratory failure with hypoxia: Secondary | ICD-10-CM | POA: Diagnosis not present

## 2019-12-22 DIAGNOSIS — Z95828 Presence of other vascular implants and grafts: Secondary | ICD-10-CM | POA: Diagnosis not present

## 2019-12-22 DIAGNOSIS — I255 Ischemic cardiomyopathy: Secondary | ICD-10-CM | POA: Diagnosis not present

## 2019-12-22 DIAGNOSIS — I2510 Atherosclerotic heart disease of native coronary artery without angina pectoris: Secondary | ICD-10-CM | POA: Diagnosis not present

## 2019-12-22 DIAGNOSIS — Z8619 Personal history of other infectious and parasitic diseases: Secondary | ICD-10-CM

## 2019-12-22 DIAGNOSIS — M1990 Unspecified osteoarthritis, unspecified site: Secondary | ICD-10-CM | POA: Diagnosis not present

## 2019-12-22 DIAGNOSIS — R748 Abnormal levels of other serum enzymes: Secondary | ICD-10-CM | POA: Diagnosis not present

## 2019-12-22 DIAGNOSIS — Z9181 History of falling: Secondary | ICD-10-CM

## 2019-12-22 DIAGNOSIS — Z87891 Personal history of nicotine dependence: Secondary | ICD-10-CM

## 2019-12-22 DIAGNOSIS — I5033 Acute on chronic diastolic (congestive) heart failure: Secondary | ICD-10-CM | POA: Diagnosis not present

## 2019-12-22 DIAGNOSIS — F329 Major depressive disorder, single episode, unspecified: Secondary | ICD-10-CM | POA: Diagnosis not present

## 2019-12-23 ENCOUNTER — Telehealth: Payer: Self-pay

## 2019-12-23 NOTE — Telephone Encounter (Addendum)
Pt left v/m that she thinks Atorvastatin is causing muscle pain. Left v/m requesting cb for more info. Pt called back; pt started on Atorvastatin 12/02/19 while in hospital. Pt first noticed nausea but no vomiting or diarrhea on 12/17/19. The first sign of muscle pain was also on 12/17/19; pt continues with lower rt back dull constant pain that goes to rt hip and then down rt upper thigh for approx 4 ". Pt took last Atorvastatin 40 mg on 12/22/19. Pt said she is not going to take anymore of the Atorvastatin. Pt thinks previously she took name brand Lipitor but pt is not positive. Pt request cb after Dr Lorelei Pont reviews this note. [facility].

## 2019-12-23 NOTE — Telephone Encounter (Signed)
Please call:  Muscle aches from cholesterol medicine are global/all over the body.  That does not sound like what she is describing, and most likely this is back pain.  She is seeing her heart doctor in 2 or 3 days, and there are other options, so I would talk about it more with him, to.

## 2019-12-24 DIAGNOSIS — I5021 Acute systolic (congestive) heart failure: Secondary | ICD-10-CM | POA: Diagnosis not present

## 2019-12-24 DIAGNOSIS — I2510 Atherosclerotic heart disease of native coronary artery without angina pectoris: Secondary | ICD-10-CM | POA: Diagnosis not present

## 2019-12-24 DIAGNOSIS — I110 Hypertensive heart disease with heart failure: Secondary | ICD-10-CM | POA: Diagnosis not present

## 2019-12-24 DIAGNOSIS — J9611 Chronic respiratory failure with hypoxia: Secondary | ICD-10-CM | POA: Diagnosis not present

## 2019-12-24 DIAGNOSIS — I255 Ischemic cardiomyopathy: Secondary | ICD-10-CM | POA: Diagnosis not present

## 2019-12-24 DIAGNOSIS — I5033 Acute on chronic diastolic (congestive) heart failure: Secondary | ICD-10-CM | POA: Diagnosis not present

## 2019-12-24 NOTE — Telephone Encounter (Signed)
[name] [name] notified as instructed by telephone.  She will discuss other options with Dr. Saunders Revel at Friday's appointment.

## 2019-12-26 ENCOUNTER — Encounter: Payer: Self-pay | Admitting: Internal Medicine

## 2019-12-26 ENCOUNTER — Ambulatory Visit (INDEPENDENT_AMBULATORY_CARE_PROVIDER_SITE_OTHER): Payer: Medicare Other | Admitting: Internal Medicine

## 2019-12-26 ENCOUNTER — Other Ambulatory Visit: Payer: Self-pay

## 2019-12-26 ENCOUNTER — Other Ambulatory Visit: Payer: Self-pay | Admitting: Family Medicine

## 2019-12-26 VITALS — BP 100/78 | HR 87 | Ht 64.0 in | Wt 199.1 lb

## 2019-12-26 DIAGNOSIS — I2510 Atherosclerotic heart disease of native coronary artery without angina pectoris: Secondary | ICD-10-CM

## 2019-12-26 DIAGNOSIS — I5033 Acute on chronic diastolic (congestive) heart failure: Secondary | ICD-10-CM | POA: Diagnosis not present

## 2019-12-26 DIAGNOSIS — I5022 Chronic systolic (congestive) heart failure: Secondary | ICD-10-CM | POA: Diagnosis not present

## 2019-12-26 DIAGNOSIS — M791 Myalgia, unspecified site: Secondary | ICD-10-CM

## 2019-12-26 DIAGNOSIS — E785 Hyperlipidemia, unspecified: Secondary | ICD-10-CM

## 2019-12-26 NOTE — Patient Instructions (Signed)
Medication Instructions:  Your physician has recommended you make the following change in your medication:  STOP Lipitor STOP Fenofibrate  Purchase Zyrtec or Claritin for allergies over the county and take 1 tablet daily.  *If you need a refill on your cardiac medications before your next appointment, please call your pharmacy*   Lab Work: CMP and CK total today.  If you have labs (blood work) drawn today and your tests are completely normal, you will receive your results only by: [name] Kitchen MyChart Message (if you have MyChart) OR . A paper copy in the mail If you have any lab test that is abnormal or we need to change your treatment, we will call you to review the results.   Testing/Procedures: None ordered   Follow-Up: At [facility], you and your health needs are our priority.  As part of our continuing mission to provide you with exceptional heart care, we have created designated Provider Care Teams.  These Care Teams include your primary Cardiologist (physician) and Advanced Practice Providers (APPs -  Physician Assistants and Nurse Practitioners) who all work together to provide you with the care you need, when you need it.  We recommend signing up for the patient portal called "MyChart".  Sign up information is provided on this After Visit Summary.  MyChart is used to connect with patients for Virtual Visits (Telemedicine).  Patients are able to view lab/test results, encounter notes, upcoming appointments, etc.  Non-urgent messages can be sent to your provider as well.   To learn more about what you can do with MyChart, go to [email or web address].    Your next appointment:   4 week(s)  The format for your next appointment:   In office  Provider:    You may see  Dr. Saunders Revel or one of the following Advanced Practice Providers on your designated Care Team:    Murray Hodgkins, NP  Christell Faith, PA-C  Marrianne Mood, PA-C    Other Instructions N/A

## 2019-12-26 NOTE — Progress Notes (Signed)
Follow-up Outpatient Visit Date: 12/26/2019  Primary Care Provider: Owens Loffler, MD [place] [address]  Chief Complaint: Follow-up heart failure  HPI:  Ms. Chirdon is a 69 y.o. female with history of multivessel coronary artery disease, recently diagnosed systolic heart failure, hypertension, hyperlipidemia, arthritis, and breast cancer, who presents for follow-up of HFrEF.  Ms. Daria was hospitalized last month with exertional dyspnea and was found to have newly reduced LVEF of 25-30% by echo.  She was treated medically with diuresis and goal-directed medical therapy.  Catheterization during the admission showed significant two-vessel coronary artery disease with diffuse LAD stenosis of 70-80% in the mid to distal vessel and chronic subtotal occlusion of the mid RCA with left-to-right collaterals.  Left and right heart filling pressures remained moderately elevated in the setting of reduced cardiac output.  Medical therapy was recommended as small vessel size and diffuse disease limited revascularization options.  She was discharged on aspirin, atorvastatin, carvedilol, fenofibrate, furosemide 40 mg daily, Entresto, and spironolactone.  Today, Ms. Ohaire has a couple of concerns.  She has developed severe myalgias, predominantly in the right thigh, which she attributes to statin therapy.  She stopped taking atorvastatin 4 days ago and has noticed some improvement.  She continues to take fenofibrate.  She notes that she was previously on rosuvastatin and lovastatin without difficulties.  She has also experienced a cough for the last 2 weeks, which she attributes to some of her medications.  This began around the time that she was transition from losartan to [facility] by Darylene Price, NP.  Ms. Shong is also experienced some rhinorrhea, for which she has been using over-the-counter sinus pills.  These have not helped.  She denies chest pain, palpitations, and edema.   She still has some exertional dyspnea, though this is much better than leading up to her hospitalization last month.  She denies orthopnea and PND.  --------------------------------------------------------------------------------------------------  Past Medical History:  Diagnosis Date  . Allergic rhinitis   . Avulsion fracture of distal fibula 3/10   and sprain; Left  . Breast cancer [facility]) 2004   Left Breast Cancer  . CHF (congestive heart failure) ([place])   . Coronary artery disease    a. 10/2012 ETT: nl; b. 12/2012 Cath: LM nl, LAD min irregs, D1 min irregs, LCX 30p, RCA 25m, RPDA 40p, EF 50%.  . Diastolic dysfunction    a. 12/2012 Echo: EF 50-55%. No rwma. Gr1 DD. Nl RV fxn. Nl PASP.  [name] Kitchen History of hepatitis B    ?--tested + by [employer]  . HLD (hyperlipidemia)   . HTN (hypertension)   . Osteoarthritis   . Osteopenia    Past Surgical History:  Procedure Laterality Date  . ABDOMINAL HYSTERECTOMY  1985   endometriosis; anatomy  . BREAST SURGERY  10/2002  . CARDIAC CATHETERIZATION  12/19/12   [facility]; EF 50%  . COLONOSCOPY  8/09   Hyperplastic polyp; repeat in 10 years  . HIP FRACTURE SURGERY  06/14/2011   ball replaced (Dr. Earvin Hansen)  . MASTECTOMY Left 2004  . RIGHT/LEFT HEART CATH AND CORONARY ANGIOGRAPHY N/A 12/02/2019   Procedure: RIGHT/LEFT HEART CATH AND CORONARY ANGIOGRAPHY;  Surgeon: [name], MD;  Location: [place] [facility];  Service: Cardiovascular;  Laterality: N/A;  . TONSILLECTOMY  1958    Recent CV Pertinent Labs: Lab Results  Component Value Date   CHOL 155 11/29/2019   HDL 35 (L) 11/29/2019   LDLCALC 87 11/29/2019   LDLDIRECT 76.0  04/23/2019   TRIG 165 (H) 11/29/2019   CHOLHDL 4.4 11/29/2019   INR 1.0 12/16/2012   BNP 188.0 (H) 11/28/2019   K 3.7 12/03/2019   MG 2.2 12/03/2019   BUN 26 (H) 12/03/2019   BUN 13 12/16/2012   CREATININE 0.58 12/03/2019    Past medical and surgical history were reviewed and updated in EPIC.  Current Meds    Medication Sig  . aspirin EC 81 MG tablet Take 81 mg by mouth daily.  . Calcium Carbonate-Vitamin D (CALCIUM 600+D) 600-400 MG-UNIT tablet Take 1 tablet by mouth 2 (two) times daily.  . carvedilol (COREG) 6.25 MG tablet Take 1 tablet (6.25 mg total) by mouth 2 (two) times daily with a meal.  . fenofibrate (TRICOR) 145 MG tablet Take 1 tablet (145 mg total) by mouth daily.  [name] Kitchen FLUoxetine (PROZAC) 20 MG capsule Take 1 capsule by mouth once daily  . fluticasone (FLONASE) 50 MCG/ACT nasal spray Place 1-2 sprays into both nostrils daily as needed for rhinitis.  . furosemide (LASIX) 40 MG tablet Take 1 tablet (40 mg total) by mouth daily.  [name] Kitchen GRAPE SEED EXTRACT PO Take 1 capsule by mouth 2 (two) times daily.   Javier Docker Oil 1000 MG CAPS Take 1,000 mg by mouth at bedtime.  . meclizine (ANTIVERT) 25 MG tablet Take 0.5-1 tablets (12.5-25 mg total) by mouth 3 (three) times daily as needed for dizziness (sedation caution).  . meloxicam (MOBIC) 15 MG tablet Take 1 tablet (15 mg total) by mouth daily as needed for pain. With food  . Misc Natural Products (OSTEO BI-FLEX ADV JOINT SHIELD) TABS Take 1 tablet by mouth 2 (two) times daily.  . Multiple Vitamin (MULTIVITAMIN WITH MINERALS) TABS tablet Take 1 tablet by mouth daily.  . Multiple Vitamins-Minerals (PRESERVISION AREDS 2+MULTI VIT) CAPS Take 1 capsule by mouth daily.  [name] Kitchen nystatin cream (MYCOSTATIN) Apply 1 application topically 2 (two) times daily.  . sacubitril-valsartan (ENTRESTO) 49-51 MG Take 1 tablet by mouth 2 (two) times daily.  [name] Kitchen spironolactone (ALDACTONE) 25 MG tablet Take 1 tablet (25 mg total) by mouth daily.  [name] Kitchen tiZANidine (ZANAFLEX) 4 MG tablet TAKE 1 TABLET (4 MG TOTAL) BY MOUTH AT BEDTIME AS NEEDED FOR MUSCLE SPASMS.    Allergies: Sulfur, Ace inhibitors, Codeine, Morphine, and Sulfonamide derivatives  Social History   Tobacco Use  . Smoking status: Former Smoker    Packs/day: 0.50    Years: 10.00    Pack years: 5.00    Types:  Cigarettes  . Smokeless tobacco: Former Systems developer    Quit date: 01/24/1986  . Tobacco comment: 1989 quit   Substance Use Topics  . Alcohol use: No  . Drug use: No    Family History  Problem Relation Age of Onset  . Heart attack Father 77  . Heart disease Father   . Sarcoidosis Mother   . Ovarian cancer Other        Aunt  . Diabetes Other        GP  . Breast cancer Sister 26  . Cancer Sister        breast  . Cancer Paternal Aunt        colon/ovarian    Review of Systems: A 12-system review of systems was performed and was negative except as noted in the HPI.  --------------------------------------------------------------------------------------------------  Physical Exam: BP 100/78 (BP Location: Right Arm, Patient Position: Sitting, Cuff Size: Normal)   Pulse 87   Ht 5\' 4"  (1.626 m)   Wt  199 lb 2 oz (90.3 kg)   SpO2 97%   BMI 34.18 kg/m   General: NAD. Neck: No JVD or HJR. Lungs: Clear to auscultation bilaterally without wheezes or crackles. Heart: Regular rate and rhythm without murmurs, rubs, or gallops. Abdomen: Soft, nontender, nondistended. Extremities: No lower extremity edema.  EKG: Sinus rhythm with PVCs, LVH, and inferior Q waves.  Compared with prior tracing from 11/28/2019, PVCs are now present.  Inferior Q waves are also new.  Lab Results  Component Value Date   WBC 9.4 12/03/2019   HGB 14.0 12/03/2019   HCT 43.8 12/03/2019   MCV 89.2 12/03/2019   PLT 413 (H) 12/03/2019    Lab Results  Component Value Date   NA 137 12/03/2019   K 3.7 12/03/2019   CL 96 (L) 12/03/2019   CO2 31 12/03/2019   BUN 26 (H) 12/03/2019   CREATININE 0.58 12/03/2019   GLUCOSE 116 (H) 12/03/2019   ALT 44 11/28/2019    Lab Results  Component Value Date   CHOL 155 11/29/2019   HDL 35 (L) 11/29/2019   LDLCALC 87 11/29/2019   LDLDIRECT 76.0 04/23/2019   TRIG 165 (H) 11/29/2019   CHOLHDL 4.4 11/29/2019     --------------------------------------------------------------------------------------------------  ASSESSMENT AND PLAN: Chronic HFrEF: Ms. Bering appears euvolemic with improved symptoms.  She currently reports NYHA class II heart failure.  She was transitioned from losartan to [facility] at her visit with [facility] about 2 weeks ago and now complains of a cough.  She also reports prior cough with ACE inhibitors.  We have agreed to defer changes to her evidence-based heart failure therapy and to try an over-the-counter antihistamine such as loratadine, cetirizine, or fexofenadine first.  If cough persists, I would favor transitioning from Entresto back to losartan.  I will check a CMP today to ensure stable renal function, electrolytes, and liver function.  Coronary artery disease: Ms. Karnitz does not report any angina, though she has significant two-vessel CAD, likely driving her cardiomyopathy.  We again reviewed her angiograms today, demonstrating severe diffuse disease of the mid/distal LAD as well as a chronic subtotal occlusion of the RCA.  Neither the LAD nor RCA is optimal for revascularization, though we will ultimately need to consider this.  However, we would like to optimize medical therapy first and reassess her LVEF in a couple of months.  If EF has not normalized or symptoms worsen, I would favor performing a viability study and then reconsidering percutaneous versus surgical revascularization of the LAD +/- RCA.  For now, we will continue secondary prevention with aspirin and carvedilol.  I will defer statin therapy, as above, in the setting of myalgias.  Hyperlipidemia and myalgias: Ms. Arends reports significant myalgias with statin therapy.  She is also on fenofibrate.  I think it is reasonable to hold atorvastatin and discontinue fenofibrate at this time.  We will check a total CK level to ensure that she has not developed myositis.  Once her symptoms have resolved, I would  favor challenging her with rosuvastatin 5 mg daily.  If she is intolerant of statin therapy needed to achieve an LDL less than 70, will need to consider addition of a PCSK9 inhibitor.  I would also favor using Vascepa over a fibrate for initial management of her hypertriglyceridemia, given more favorable cardiac effects.   Follow-up: Return to clinic in 1 month.  We will cancel her upcoming appointment with Darylene Price, NP, in 2 weeks.  [name], MD 12/26/2019 10:27 AM

## 2019-12-26 NOTE — Telephone Encounter (Signed)
Last office visit 12/17/2019 for hospital follow up.  Last refilled 11/05/2019 for #30 with 1 refill.  Next Appt: 02/18/2020 for follow up.

## 2019-12-27 LAB — COMPREHENSIVE METABOLIC PANEL
ALT: 20 IU/L (ref 0–32)
AST: 24 IU/L (ref 0–40)
Albumin/Globulin Ratio: 1.8 (ref 1.2–2.2)
Albumin: 4.6 g/dL (ref 3.8–4.8)
Alkaline Phosphatase: 89 IU/L (ref 39–117)
BUN/Creatinine Ratio: 24 (ref 12–28)
BUN: 18 mg/dL (ref 8–27)
Bilirubin Total: 0.3 mg/dL (ref 0.0–1.2)
CO2: 24 mmol/L (ref 20–29)
Calcium: 10.5 mg/dL — ABNORMAL HIGH (ref 8.7–10.3)
Chloride: 100 mmol/L (ref 96–106)
Creatinine, Ser: 0.75 mg/dL (ref 0.57–1.00)
GFR calc Af Amer: 95 mL/min/{1.73_m2} (ref >59)
GFR calc non Af Amer: 82 mL/min/{1.73_m2} (ref >59)
Globulin, Total: 2.6 g/dL (ref 1.5–4.5)
Glucose: 118 mg/dL — ABNORMAL HIGH (ref 65–99)
Potassium: 4.7 mmol/L (ref 3.5–5.2)
Sodium: 140 mmol/L (ref 134–144)
Total Protein: 7.2 g/dL (ref 6.0–8.5)

## 2019-12-27 LAB — CK TOTAL AND CKMB (NOT AT ARMC)
CK-MB Index: <1 ng/mL (ref 0.0–5.3)
Total CK: 46 U/L (ref 32–182)

## 2019-12-30 DIAGNOSIS — I110 Hypertensive heart disease with heart failure: Secondary | ICD-10-CM | POA: Diagnosis not present

## 2019-12-30 DIAGNOSIS — I5021 Acute systolic (congestive) heart failure: Secondary | ICD-10-CM | POA: Diagnosis not present

## 2019-12-30 DIAGNOSIS — I2510 Atherosclerotic heart disease of native coronary artery without angina pectoris: Secondary | ICD-10-CM | POA: Diagnosis not present

## 2019-12-30 DIAGNOSIS — J9611 Chronic respiratory failure with hypoxia: Secondary | ICD-10-CM | POA: Diagnosis not present

## 2019-12-30 DIAGNOSIS — I255 Ischemic cardiomyopathy: Secondary | ICD-10-CM | POA: Diagnosis not present

## 2019-12-30 DIAGNOSIS — I5033 Acute on chronic diastolic (congestive) heart failure: Secondary | ICD-10-CM | POA: Diagnosis not present

## 2020-01-02 ENCOUNTER — Other Ambulatory Visit: Payer: Self-pay | Admitting: Family Medicine

## 2020-01-02 NOTE — Telephone Encounter (Signed)
Patient called requesting refills on medications that she was prescribed at the hospital.  She stated that she is completely out and would like to know if you could refill      furosemide (LASIX) 40 MG tablet   spironolactone (ALDACTONE) 25 MG tablet    [facility] [address] [place]

## 2020-01-04 DIAGNOSIS — J9611 Chronic respiratory failure with hypoxia: Secondary | ICD-10-CM | POA: Diagnosis not present

## 2020-01-04 DIAGNOSIS — Z90710 Acquired absence of both cervix and uterus: Secondary | ICD-10-CM | POA: Diagnosis not present

## 2020-01-04 DIAGNOSIS — Z9181 History of falling: Secondary | ICD-10-CM | POA: Diagnosis not present

## 2020-01-04 DIAGNOSIS — M8580 Other specified disorders of bone density and structure, unspecified site: Secondary | ICD-10-CM | POA: Diagnosis not present

## 2020-01-04 DIAGNOSIS — Z8619 Personal history of other infectious and parasitic diseases: Secondary | ICD-10-CM | POA: Diagnosis not present

## 2020-01-04 DIAGNOSIS — Z7982 Long term (current) use of aspirin: Secondary | ICD-10-CM | POA: Diagnosis not present

## 2020-01-04 DIAGNOSIS — I255 Ischemic cardiomyopathy: Secondary | ICD-10-CM | POA: Diagnosis not present

## 2020-01-04 DIAGNOSIS — Z87891 Personal history of nicotine dependence: Secondary | ICD-10-CM | POA: Diagnosis not present

## 2020-01-04 DIAGNOSIS — F329 Major depressive disorder, single episode, unspecified: Secondary | ICD-10-CM | POA: Diagnosis not present

## 2020-01-04 DIAGNOSIS — I2510 Atherosclerotic heart disease of native coronary artery without angina pectoris: Secondary | ICD-10-CM | POA: Diagnosis not present

## 2020-01-04 DIAGNOSIS — E785 Hyperlipidemia, unspecified: Secondary | ICD-10-CM | POA: Diagnosis not present

## 2020-01-04 DIAGNOSIS — I5021 Acute systolic (congestive) heart failure: Secondary | ICD-10-CM | POA: Diagnosis not present

## 2020-01-04 DIAGNOSIS — Z95828 Presence of other vascular implants and grafts: Secondary | ICD-10-CM | POA: Diagnosis not present

## 2020-01-04 DIAGNOSIS — J309 Allergic rhinitis, unspecified: Secondary | ICD-10-CM | POA: Diagnosis not present

## 2020-01-04 DIAGNOSIS — Z853 Personal history of malignant neoplasm of breast: Secondary | ICD-10-CM | POA: Diagnosis not present

## 2020-01-04 DIAGNOSIS — Z9012 Acquired absence of left breast and nipple: Secondary | ICD-10-CM | POA: Diagnosis not present

## 2020-01-04 DIAGNOSIS — M1990 Unspecified osteoarthritis, unspecified site: Secondary | ICD-10-CM | POA: Diagnosis not present

## 2020-01-04 DIAGNOSIS — I5033 Acute on chronic diastolic (congestive) heart failure: Secondary | ICD-10-CM | POA: Diagnosis not present

## 2020-01-04 DIAGNOSIS — I110 Hypertensive heart disease with heart failure: Secondary | ICD-10-CM | POA: Diagnosis not present

## 2020-01-04 DIAGNOSIS — R748 Abnormal levels of other serum enzymes: Secondary | ICD-10-CM | POA: Diagnosis not present

## 2020-01-04 MED ORDER — SPIRONOLACTONE 25 MG PO TABS: 25.0000 mg | ORAL_TABLET | Freq: Every day | ORAL | 1 refills | Status: DC

## 2020-01-04 MED ORDER — FUROSEMIDE 40 MG PO TABS: 40.0000 mg | ORAL_TABLET | Freq: Every day | ORAL | 1 refills | Status: DC

## 2020-01-05 ENCOUNTER — Ambulatory Visit: Payer: Medicare Other | Attending: Internal Medicine

## 2020-01-05 ENCOUNTER — Other Ambulatory Visit: Payer: Self-pay

## 2020-01-05 ENCOUNTER — Other Ambulatory Visit: Payer: Self-pay | Admitting: *Deleted

## 2020-01-05 DIAGNOSIS — Z23 Encounter for immunization: Secondary | ICD-10-CM

## 2020-01-05 MED ORDER — CARVEDILOL 6.25 MG PO TABS: 6.2500 mg | ORAL_TABLET | Freq: Two times a day (BID) | ORAL | 1 refills | Status: DC

## 2020-01-05 NOTE — Telephone Encounter (Signed)
Previously done

## 2020-01-05 NOTE — Progress Notes (Signed)
   Covid-19 Vaccination Clinic  Name:  [name] [name]    MRN: [id number] DOB: 03/17/1951  01/05/2020  [name] was observed post Covid-19 immunization for 15 minutes without incident. She was provided with Vaccine Information Sheet and instruction to access the V-Safe system.   [name] was instructed to call 911 with any severe reactions post vaccine: [name] Kitchen Difficulty breathing  . Swelling of face and throat  . A fast heartbeat  . A bad rash all over body  . Dizziness and weakness   Immunizations Administered    Name Date Dose VIS Date Route   Pfizer COVID-19 Vaccine 01/05/2020 11:23 AM 0.3 mL 11/05/2018 Intramuscular   Manufacturer: [place]   Lot: [id number]   [place]: [id number]

## 2020-01-06 ENCOUNTER — Ambulatory Visit: Payer: Medicare Other | Admitting: Family

## 2020-01-09 DIAGNOSIS — I5033 Acute on chronic diastolic (congestive) heart failure: Secondary | ICD-10-CM | POA: Diagnosis not present

## 2020-01-09 DIAGNOSIS — I2510 Atherosclerotic heart disease of native coronary artery without angina pectoris: Secondary | ICD-10-CM | POA: Diagnosis not present

## 2020-01-09 DIAGNOSIS — J9611 Chronic respiratory failure with hypoxia: Secondary | ICD-10-CM | POA: Diagnosis not present

## 2020-01-09 DIAGNOSIS — I110 Hypertensive heart disease with heart failure: Secondary | ICD-10-CM | POA: Diagnosis not present

## 2020-01-09 DIAGNOSIS — I5021 Acute systolic (congestive) heart failure: Secondary | ICD-10-CM | POA: Diagnosis not present

## 2020-01-09 DIAGNOSIS — I255 Ischemic cardiomyopathy: Secondary | ICD-10-CM | POA: Diagnosis not present

## 2020-01-23 ENCOUNTER — Encounter: Payer: Self-pay | Admitting: Family

## 2020-01-23 ENCOUNTER — Other Ambulatory Visit: Payer: Self-pay

## 2020-01-23 ENCOUNTER — Ambulatory Visit (INDEPENDENT_AMBULATORY_CARE_PROVIDER_SITE_OTHER): Payer: Medicare Other | Admitting: Family

## 2020-01-23 VITALS — BP 124/83 | HR 83 | Ht 64.0 in | Wt 199.0 lb

## 2020-01-23 DIAGNOSIS — E785 Hyperlipidemia, unspecified: Secondary | ICD-10-CM

## 2020-01-23 DIAGNOSIS — I5020 Unspecified systolic (congestive) heart failure: Secondary | ICD-10-CM

## 2020-01-23 DIAGNOSIS — I5022 Chronic systolic (congestive) heart failure: Secondary | ICD-10-CM | POA: Diagnosis not present

## 2020-01-23 DIAGNOSIS — I2510 Atherosclerotic heart disease of native coronary artery without angina pectoris: Secondary | ICD-10-CM | POA: Diagnosis not present

## 2020-01-23 MED ORDER — ROSUVASTATIN CALCIUM 5 MG PO TABS: 5.0000 mg | ORAL_TABLET | Freq: Every day | ORAL | 2 refills | Status: DC

## 2020-01-23 NOTE — Progress Notes (Signed)
Office Visit    Patient Name: [name] [name] Date of Encounter: 01/23/2020  Primary Care Provider:  Owens Loffler, MD Primary Cardiologist:  Kathlyn Sacramento, MD Electrophysiologist:  None   Chief Complaint    [name] [name] is a 69 y.o. female with a hx of multivessel CAD, HFrEF, HTN, HLD, arthritis, breast cancer presents today for follow-up of HFrEF  Past Medical History    Past Medical History:  Diagnosis Date  . Allergic rhinitis   . Avulsion fracture of distal fibula 3/10   and sprain; Left  . Breast cancer [facility]) 2004   Left Breast Cancer  . CHF (congestive heart failure) ([place])   . Coronary artery disease    a. 10/2012 ETT: nl; b. 12/2012 Cath: LM nl, LAD min irregs, D1 min irregs, LCX 30p, RCA 63m, RPDA 40p, EF 50%.  . Diastolic dysfunction    a. 12/2012 Echo: EF 50-55%. No rwma. Gr1 DD. Nl RV fxn. Nl PASP.  [name] Kitchen History of hepatitis B    ?--tested + by [employer]  . HLD (hyperlipidemia)   . HTN (hypertension)   . Osteoarthritis   . Osteopenia    Past Surgical History:  Procedure Laterality Date  . ABDOMINAL HYSTERECTOMY  1985   endometriosis; anatomy  . BREAST SURGERY  10/2002  . CARDIAC CATHETERIZATION  12/19/12   [facility]; EF 50%  . COLONOSCOPY  8/09   Hyperplastic polyp; repeat in 10 years  . HIP FRACTURE SURGERY  06/14/2011   ball replaced (Dr. Earvin Hansen)  . MASTECTOMY Left 2004  . RIGHT/LEFT HEART CATH AND CORONARY ANGIOGRAPHY N/A 12/02/2019   Procedure: RIGHT/LEFT HEART CATH AND CORONARY ANGIOGRAPHY;  Surgeon: [name], MD;  Location: [place] [facility];  Service: Cardiovascular;  Laterality: N/A;  . TONSILLECTOMY  1958    Allergies  Allergies  Allergen Reactions  . Sulfur Rash  . Ace Inhibitors Cough  . Codeine Other (See Comments)    Reation:  Hallucinations   . Morphine Other (See Comments)    Reaction:  Hallucinations   . Sulfonamide Derivatives Rash    History of Present Illness    [name] [name] is a 69 y.o. female with a hx of  multivessel CAD, HFrEF, HTN, HLD, arthritis, breast cancer last seen 12/26/2019 by Dr. Saunders Revel.  Hospitalized March 2021 with exertional dyspnea and found to have newly reduced LVEF 25-30% by echo.  Treated medically with diuresis and goal-directed medical therapy.  Cardiac catheterization during mission with significant two-vessel CAD with diffuse LAD stenosis of 70-80% in the mid to distal vessel and chronic subtotal occlusion of the mid RCA with left-to-right collaterals.  Left and right heart filling pressures remained moderately elevated in setting of reduced cardiac output.  Medical therapy was recommended as small vessel size and diffuse disease limited revascularization options.  Discharged on atorvastatin, aspirin, carvedilol, fenofibrate, furosemide, Entresto, spironolactone.  Follow-up 12/26/2019 she noted myalgias and her fenofibrate and statin were discontinued.  She was transitioned from losartan to Entresto approximately 12/09/2019 and noted cough.  This was presumed to be due to allergies and she was recommended to start an over-the-counter antihistamine.  She presents today for follow-up.  Tells me her myalgias have resolved daily discussed adding Crestor 5 mg daily per Dr. Janeann Merl recommendation.  Tells me she think she has been on this medication in the past and tolerated it well.  She is agreeable to trial.  Monitors her weights carefully at home and reports they have been stable.  Endorses  eating a low-sodium diet.  Endorses fluid intake of less than 2 L.  She has 3 family members that live with her and help her adhere to her dietary goals.  She has been trying to walk for exercise.  She has an upcoming vacation planned in July and is excited.  Tells me her cough has much improved with the addition of antihistamine and she feels she is tolerating Entresto well.  She reports her rhinorrhea has resolved.  EKGs/Labs/Other Studies Reviewed:   The following studies were reviewed today: Echo  11/29/19  1. Left ventricular ejection fraction, by estimation, is 25 to 30%. The  left ventricle has severely decreased function. The left ventricle  demonstrates global hypokinesis. The left ventricular internal cavity size  was mildly dilated. There is mild left  ventricular hypertrophy. Left ventricular diastolic parameters are  consistent with Grade I diastolic dysfunction (impaired relaxation).   2. Right ventricular systolic function is normal. The right ventricular  size is normal. Tricuspid regurgitation signal is inadequate for assessing  PA pressure.   3. Left atrial size was mildly dilated.   4. The inferior vena cava is normal in size with greater than 50%  respiratory variability, suggesting right atrial pressure of 3 mmHg.   R/LHC 12/02/19 Conclusions: 1. Significant two-vessel coronary artery disease with diffuse LAD disease of up to 70-80% in the mid/distal vessel and chronic subtotal occlusion of the mid RCA with left-to-right collaterals. 2. Moderately elevated left heart, right heart, and pulmonary artery pressures. 3. Low normal Fick cardiac output/index.   Recommendations: 1. Escalate diuresis and optimize evidence-based heart failure therapy. 2. Medical management of coronary artery disease.  Small vessel size and diffuse disease involving LAD and RCA make percutaneous and surgical revascularization suboptimal.   EKG:  EKG is ordered today.  The ekg ordered today demonstrates NSR 80 bpm with LVH and inferior Q waves.   Recent Labs: 04/23/2019: TSH 2.34 11/28/2019: B Natriuretic Peptide 188.0 12/03/2019: Hemoglobin 14.0; Magnesium 2.2; Platelets 413 12/26/2019: ALT 20; BUN 18; Creatinine, Ser 0.75; Potassium 4.7; Sodium 140  Recent Lipid Panel    Component Value Date/Time   CHOL 155 11/29/2019 0558   TRIG 165 (H) 11/29/2019 0558   HDL 35 (L) 11/29/2019 0558   CHOLHDL 4.4 11/29/2019 0558   VLDL 33 11/29/2019 0558   LDLCALC 87 11/29/2019 0558   LDLDIRECT 76.0  04/23/2019 1047    Home Medications   Current Meds  Medication Sig  . aspirin EC 81 MG tablet Take 81 mg by mouth daily.  . Calcium Carbonate-Vitamin D (CALCIUM 600+D) 600-400 MG-UNIT tablet Take 1 tablet by mouth 2 (two) times daily.  . carvedilol (COREG) 6.25 MG tablet Take 1 tablet (6.25 mg total) by mouth 2 (two) times daily with a meal.  . FLUoxetine (PROZAC) 20 MG capsule Take 1 capsule by mouth once daily  . fluticasone (FLONASE) 50 MCG/ACT nasal spray Place 1-2 sprays into both nostrils daily as needed for rhinitis.  . furosemide (LASIX) 40 MG tablet Take 1 tablet (40 mg total) by mouth daily.  [name] Kitchen GRAPE SEED EXTRACT PO Take 1 capsule by mouth 2 (two) times daily.   Javier Docker Oil 1000 MG CAPS Take 1,000 mg by mouth at bedtime.  . meclizine (ANTIVERT) 25 MG tablet Take 0.5-1 tablets (12.5-25 mg total) by mouth 3 (three) times daily as needed for dizziness (sedation caution).  . meloxicam (MOBIC) 15 MG tablet TAKE 1 TABLET (15 MG TOTAL) BY MOUTH DAILY AS NEEDED FOR PAIN. WITH  FOOD  . Misc Natural Products (OSTEO BI-FLEX ADV JOINT SHIELD) TABS Take 1 tablet by mouth 2 (two) times daily.  . Multiple Vitamin (MULTIVITAMIN WITH MINERALS) TABS tablet Take 1 tablet by mouth daily.  . Multiple Vitamins-Minerals (PRESERVISION AREDS 2+MULTI VIT) CAPS Take 1 capsule by mouth daily.  [name] Kitchen nystatin cream (MYCOSTATIN) Apply 1 application topically 2 (two) times daily.  . sacubitril-valsartan (ENTRESTO) 49-51 MG Take 1 tablet by mouth 2 (two) times daily.  [name] Kitchen spironolactone (ALDACTONE) 25 MG tablet Take 1 tablet (25 mg total) by mouth daily.  [name] Kitchen tiZANidine (ZANAFLEX) 4 MG tablet TAKE 1 TABLET (4 MG TOTAL) BY MOUTH AT BEDTIME AS NEEDED FOR MUSCLE SPASMS.      Review of Systems   Review of Systems  Constitution: Negative for chills, fever and malaise/fatigue.  Cardiovascular: Positive for dyspnea on exertion. Negative for chest pain, leg swelling, near-syncope, orthopnea, palpitations and syncope.   Respiratory: Negative for cough, shortness of breath and wheezing.   Gastrointestinal: Negative for nausea and vomiting.  Neurological: Negative for dizziness, light-headedness and weakness.   All other systems reviewed and are otherwise negative except as noted above.  Physical Exam    VS:  BP 124/83   Pulse 83   Ht 5\' 4"  (1.626 m)   Wt 199 lb (90.3 kg)   SpO2 95%   BMI 34.16 kg/m  , BMI Body mass index is 34.16 kg/m. GEN: Well nourished, well developed, in no acute distress. HEENT: normal. Neck: Supple, no JVD, carotid bruits, or masses. Cardiac: RRR, no murmurs, rubs, or gallops. No clubbing, cyanosis, edema.  Radials/DP/PT 2+ and equal bilaterally.  Respiratory:  Respirations regular and unlabored, clear to auscultation bilaterally. GI: Soft, nontender, nondistended, BS + x 4. MS: No deformity or atrophy. Skin: Warm and dry, no rash. Neuro:  Strength and sensation are intact. Psych: Normal affect.  Assessment & Plan    1. Chronic HFpEF -euvolemic, compensated on exam.  NYHA II symptoms of dyspnea.  Her cough has resolved with the addition of Zyrtec, continue Entresto.  Present GDMT includes Coreg 6.25 twice daily, Lasix 40 mg daily, spironolactone 25 mg daily, Entresto 49 to 51 mg twice daily.  Plan for repeat echocardiogram in June as she will have been 3 months on her present guideline directed therapy.  Deferred changes until after echocardiogram due to low normal blood pressure.  May consider addition of dapagliflozin if her EF does not improve.  We did discuss cardiac rehab today which she politely declined.  2. CAD -reports no angina though known to significant two-vessel CAD likely driving cardiomyopathy.  Severe diffuse disease of mid/distal LAD as well as chronic subtotal occlusion of RCA which are not optimal for revascularization.  Plan for repeat echo, as above.  If EF has not normalized or symptoms worsen, productive and consider viability study and reconsidering  percutaneous versus surgical revascularization of the LAD and/or RCA.  Continue secondary prevention with aspirin and carvedilol.  Statin added today, as below  3. HLD -myalgias have resolved since discontinuing fenofibrate and atorvastatin.  She is agreeable to trial Crestor 5 mg daily.  Tells me she thinks he took this medication previously and tolerated well.  If she is intolerant of statin will need to consider PCSK9 inhibitor to reach goal of LDL less than 70.  Plan for repeat lipid/liver function in 6 weeks if she tolerates rosuvastatin.  At that time consider addition of PCSK9 I or Vascepa as needed.  Disposition: Echocardiogram ordered for June.  Follow up in 6 week(s) with Dr. Saunders Revel or APP.    Loel Dubonnet, NP 01/23/2020, 11:38 AM

## 2020-01-23 NOTE — Patient Instructions (Addendum)
Medication Instructions:  Your physician has recommended you make the following change in your medication:   START Rosuvastatin (Crestor) 5mg  daily  *If you need a refill on your cardiac medications before your next appointment, please call your pharmacy*  Lab Work: No lab work today. We will plan to recheck your cholesterol numbers at your next appointment.   Testing/Procedures: Your EKG today showed normal sinus rhythm and was stable.   Your physician has requested that you have an echocardiogram. Echocardiography is a painless test that uses sound waves to create images of your heart. It provides your doctor with information about the size and shape of your heart and how well your heart's chambers and valves are working. This procedure takes approximately one hour. There are no restrictions for this procedure. We will plan to do this ultrasound in June.    Follow-Up: At [facility], you and your health needs are our priority.  As part of our continuing mission to provide you with exceptional heart care, we have created designated Provider Care Teams.  These Care Teams include your primary Cardiologist (physician) and Advanced Practice Providers (APPs -  Physician Assistants and Nurse Practitioners) who all work together to provide you with the care you need, when you need it.  We recommend signing up for the patient portal called "MyChart".  Sign up information is provided on this After Visit Summary.  MyChart is used to connect with patients for Virtual Visits (Telemedicine).  Patients are able to view lab/test results, encounter notes, upcoming appointments, etc.  Non-urgent messages can be sent to your provider as well.   To learn more about what you can do with MyChart, go to [email or web address].    Your next appointment:   6 week(s)  The format for your next appointment:   In Person  Provider:   You may see [name], MD or one of the following Advanced Practice  Providers on your designated Care Team:    Murray Hodgkins, NP  Christell Faith, PA-C  Marrianne Mood, PA-C  Other Instructions  If you decide you wish to participate in cardiac rehab simply call our office and we can place the referral.

## 2020-01-27 ENCOUNTER — Ambulatory Visit: Payer: Medicare Other | Attending: Internal Medicine

## 2020-01-27 DIAGNOSIS — Z23 Encounter for immunization: Secondary | ICD-10-CM

## 2020-01-27 NOTE — Progress Notes (Signed)
   Covid-19 Vaccination Clinic  Name:  [name] [name]    MRN: [id number] DOB: [birth year]  01/27/2020  [name] [name] was observed post Covid-19 immunization for 15 minutes without incident. She was provided with Vaccine Information Sheet and instruction to access the V-Safe system.   [name] [name] was instructed to call 911 with any severe reactions post vaccine: [name] Kitchen Difficulty breathing  . Swelling of face and throat  . A fast heartbeat  . A bad rash all over body  . Dizziness and weakness   Immunizations Administered    Name Date Dose VIS Date Route   Pfizer COVID-19 Vaccine 01/27/2020  1:51 PM 0.3 mL 11/05/2018 Intramuscular   Manufacturer: [place]   Lot: [id number]   [place]: [id number]

## 2020-02-18 ENCOUNTER — Encounter: Payer: Self-pay | Admitting: Family Medicine

## 2020-02-18 ENCOUNTER — Ambulatory Visit (INDEPENDENT_AMBULATORY_CARE_PROVIDER_SITE_OTHER): Payer: Medicare Other | Admitting: Family Medicine

## 2020-02-18 ENCOUNTER — Other Ambulatory Visit: Payer: Self-pay

## 2020-02-18 VITALS — BP 124/60 | HR 76 | Temp 98.1°F | Ht 64.5 in | Wt 200.0 lb

## 2020-02-18 DIAGNOSIS — R2689 Other abnormalities of gait and mobility: Secondary | ICD-10-CM | POA: Diagnosis not present

## 2020-02-18 DIAGNOSIS — I5033 Acute on chronic diastolic (congestive) heart failure: Secondary | ICD-10-CM

## 2020-02-18 DIAGNOSIS — E785 Hyperlipidemia, unspecified: Secondary | ICD-10-CM

## 2020-02-18 DIAGNOSIS — I2510 Atherosclerotic heart disease of native coronary artery without angina pectoris: Secondary | ICD-10-CM

## 2020-02-18 NOTE — Progress Notes (Signed)
[name] [name] T. Saga Balthazar, MD, [place] at [facility] [place], [address]  Phone: [phone]  FAX: [place] - 69 y.o. female  MRN [id number]  Date of Birth: 24-Mar-1951  Date: 02/18/2020  PCP: Owens Loffler, MD  Referral: Owens Loffler, MD  Chief Complaint  Patient presents with  . Follow-up    6 month Cholesterol & Liver    This visit occurred during the SARS-CoV-2 public health emergency.  Safety protocols were in place, including screening questions prior to the visit, additional usage of staff PPE, and extensive cleaning of exam room while observing appropriate contact time as indicated for disinfecting solutions.   Subjective:   [name] [name] is a 69 y.o. very pleasant female patient with Body mass index is 33.8 kg/m. who presents with the following:  She has seen quite a number of different specialists in the interim.  Since my last health maintenance exam with her. In March, the patient was admitted with respiratory failure and heart failure.  She is doing better and is quite compliant in the interim time.  Lipids: Doing well, stable. Tolerating meds fine with no SE. Panel reviewed with patient.  Lipids:    Component Value Date/Time   CHOL 155 11/29/2019 0558   TRIG 165 (H) 11/29/2019 0558   HDL 35 (L) 11/29/2019 0558   LDLDIRECT 76.0 04/23/2019 1047   VLDL 33 11/29/2019 0558   CHOLHDL 4.4 11/29/2019 0558    Lab Results  Component Value Date   ALT 20 12/26/2019   AST 24 12/26/2019   ALKPHOS 89 12/26/2019   BILITOT 0.3 12/26/2019    Wt Readings from Last 3 Encounters:  02/18/20 200 lb (90.7 kg)  01/23/20 199 lb (90.3 kg)  12/26/19 199 lb 2 oz (90.3 kg)     Getting steady  Walking a little bit further.  Better with walking  Balance rehab that she can do at home.  Unstable at some steps.   BALANCE EXERCISES - HOME  REHAB  Review of Systems is noted in the HPI, as appropriate  Objective:   BP 124/60   Pulse 76   Temp 98.1 F (36.7 C) (Temporal)   Ht 5' 4.5" (1.638 m)   Wt 200 lb (90.7 kg)   SpO2 94%   BMI 33.80 kg/m    GEN: WDWN, NAD, Non-toxic HEENT: Atraumatic, Normocephalic. Neck supple. No masses. CV: RRR, No M/G/R. No JVD. No thrill. No extra heart sounds. PULM: CTA B, no wheezes, crackles, rhonchi. No retractions. No resp. distress. No accessory muscle use. EXTR: No c/c/e NEURO Normal gait.  PSYCH: Normally interactive. Conversant.    Laboratory and Imaging Data: Lab Review:  CBC EXTENDED Latest Ref Rng & Units 12/03/2019 12/02/2019 12/01/2019  WBC 4.0 - 10.5 K/uL 9.4 9.4 11.3(H)  RBC 3.87 - 5.11 MIL/uL 4.91 4.80 4.68  HGB 12.0 - 15.0 g/dL 14.0 13.6 13.4  HCT 36 - 46 % 43.8 42.7 41.7  PLT 150 - 400 K/uL 413(H) 399 416(H)  NEUTROABS 1.7 - 7.7 K/uL - - -  LYMPHSABS 0.7 - 4.0 K/uL - - -    BMP Latest Ref Rng & Units 12/26/2019 12/03/2019 12/02/2019  Glucose 65 - 99 mg/dL 118(H) 116(H) 104(H)  BUN 8 - 27 mg/dL 18 26(H) 36(H)  Creatinine 0.57 - 1.00 mg/dL 0.75 0.58 0.70  BUN/Creat Ratio 12 - 28 24 - -  Sodium 134 - 144  mmol/L 140 137 138  Potassium 3.5 - 5.2 mmol/L 4.7 3.7 4.1  Chloride 96 - 106 mmol/L 100 96(L) 97(L)  CO2 20 - 29 mmol/L _0 Calcium 8.7 - 10.3 mg/dL 10.5(H) 9.3 9.5    Hepatic Function Latest Ref Rng & Units 12/26/2019 11/28/2019 04/23/2019  Total Protein 6.0 - 8.5 g/dL 7.2 7.7 7.3  Albumin 3.8 - 4.8 g/dL 4.6 4.2 4.6  AST 0 - 40 IU/L 24 42(H) 32  ALT 0 - 32 IU/L 20 44 45(H)  Alk Phosphatase 39 - 117 IU/L 89 81 76  Total Bilirubin 0.0 - 1.2 mg/dL 0.3 0.8 0.4  Bilirubin, Direct 0.0 - 0.3 mg/dL - - 0.1    Lab Results  Component Value Date   CHOL 155 11/29/2019   Lab Results  Component Value Date   HDL 35 (L) 11/29/2019   Lab Results  Component Value Date   LDLCALC 87 11/29/2019   Lab Results  Component Value Date   TRIG 165 (H) 11/29/2019    Lab Results  Component Value Date   CHOLHDL 4.4 11/29/2019   No results for input(s): PSA in the last 72 hours. Lab Results  Component Value Date   HCVAB NEG 04/01/2009   Lab Results  Component Value Date   VD25OH 33.89 04/23/2019   VD25OH 32.80 12/06/2017   VD25OH 25.52 (L) 07/07/2015     Lab Results  Component Value Date   HGBA1C 5.7 04/23/2019   HGBA1C 5.9 12/19/2017   Lab Results  Component Value Date   LDLCALC 87 11/29/2019   CREATININE 0.75 12/26/2019     Assessment and Plan:     ICD-10-CM   1. Imbalance  R26.89   2. Hyperlipidemia LDL goal <70  E78.5   3. Coronary artery disease involving native coronary artery of native heart without angina pectoris  I25.10   4. CHF (congestive heart failure), NYHA class I, acute on chronic, diastolic (HCC)  C58.85    Total encounter time: 30 minutes. On the day of the patient encounter, this can include review of prior records, labs, and imaging.  Additional time can include counselling, consultation with peer MD in person or by telephone.  This also includes independent review of Radiology.  This includes quite a bit of chart review.  Globally, she is doing well.  She has had excellent follow-up on her other medical problems and has been very compliant.  She has been having some imbalance with gait, and I reviewed quite a number of balance training exercises with the patient.  Follow-up: Return for after 08/18/2020 for medicare wellness.  No orders of the defined types were placed in this encounter.  There are no discontinued medications. No orders of the defined types were placed in this encounter.   Signed,  Maud Deed. Copland, MD   Outpatient Encounter Medications as of 02/18/2020  Medication Sig  . aspirin EC 81 MG tablet Take 81 mg by mouth daily.  . Calcium Carbonate-Vitamin D (CALCIUM 600+D) 600-400 MG-UNIT tablet Take 1 tablet by mouth 2 (two) times daily.  . cetirizine (ZYRTEC) 10 MG tablet Take 10 mg by  mouth at bedtime.  [name] Kitchen FLUoxetine (PROZAC) 20 MG capsule Take 1 capsule by mouth once daily  . fluticasone (FLONASE) 50 MCG/ACT nasal spray Place 1-2 sprays into both nostrils daily as needed for rhinitis.  [name] Kitchen GRAPE SEED EXTRACT PO Take 1 capsule by mouth 2 (two) times daily.   Javier Docker Oil 1000 MG CAPS Take 1,000 mg  by mouth at bedtime.  . meclizine (ANTIVERT) 25 MG tablet Take 0.5-1 tablets (12.5-25 mg total) by mouth 3 (three) times daily as needed for dizziness (sedation caution).  . meloxicam (MOBIC) 15 MG tablet TAKE 1 TABLET (15 MG TOTAL) BY MOUTH DAILY AS NEEDED FOR PAIN. WITH FOOD  . Misc Natural Products (OSTEO BI-FLEX ADV JOINT SHIELD) TABS Take 1 tablet by mouth 2 (two) times daily.  . Multiple Vitamin (MULTIVITAMIN WITH MINERALS) TABS tablet Take 1 tablet by mouth daily.  . Multiple Vitamins-Minerals (PRESERVISION AREDS 2+MULTI VIT) CAPS Take 1 capsule by mouth daily.  [name] Kitchen nystatin cream (MYCOSTATIN) Apply 1 application topically 2 (two) times daily.  . rosuvastatin (CRESTOR) 5 MG tablet Take 1 tablet (5 mg total) by mouth daily.  . sacubitril-valsartan (ENTRESTO) 49-51 MG Take 1 tablet by mouth 2 (two) times daily.  [name] Kitchen tiZANidine (ZANAFLEX) 4 MG tablet TAKE 1 TABLET (4 MG TOTAL) BY MOUTH AT BEDTIME AS NEEDED FOR MUSCLE SPASMS.  . carvedilol (COREG) 6.25 MG tablet Take 1 tablet (6.25 mg total) by mouth 2 (two) times daily with a meal.  . furosemide (LASIX) 40 MG tablet Take 1 tablet (40 mg total) by mouth daily.  [name] Kitchen spironolactone (ALDACTONE) 25 MG tablet Take 1 tablet (25 mg total) by mouth daily.   No facility-administered encounter medications on file as of 02/18/2020.

## 2020-03-01 ENCOUNTER — Other Ambulatory Visit: Payer: Self-pay

## 2020-03-01 ENCOUNTER — Ambulatory Visit (INDEPENDENT_AMBULATORY_CARE_PROVIDER_SITE_OTHER): Payer: Medicare Other

## 2020-03-01 DIAGNOSIS — I2510 Atherosclerotic heart disease of native coronary artery without angina pectoris: Secondary | ICD-10-CM

## 2020-03-01 DIAGNOSIS — I5022 Chronic systolic (congestive) heart failure: Secondary | ICD-10-CM

## 2020-03-03 ENCOUNTER — Telehealth: Payer: Self-pay | Admitting: *Deleted

## 2020-03-03 NOTE — Telephone Encounter (Signed)
-----   Message from Loel Dubonnet, NP sent at 03/03/2020  7:53 AM EDT ----- Echocardiogram shows EF continues to be reduced, same as echo 3 months prior. Diastolic dysfunction (heart "stiffness") shows mild progression from grade 1 to grade 2. Overall very similar result compared to previous. Plan to discuss with Dr. Saunders Revel at upcoming office visit 03/10/20.

## 2020-03-03 NOTE — Telephone Encounter (Signed)
Left voicemail message to call back for results and upcoming appointment with provider.

## 2020-03-04 NOTE — Telephone Encounter (Signed)
No answer. Left detailed message with results, ok per DPR, and to call back if any questions.  

## 2020-03-10 ENCOUNTER — Other Ambulatory Visit: Payer: Self-pay

## 2020-03-10 ENCOUNTER — Telehealth: Payer: Self-pay | Admitting: *Deleted

## 2020-03-10 ENCOUNTER — Encounter: Payer: Self-pay | Admitting: Internal Medicine

## 2020-03-10 ENCOUNTER — Ambulatory Visit (INDEPENDENT_AMBULATORY_CARE_PROVIDER_SITE_OTHER): Payer: Medicare Other | Admitting: Internal Medicine

## 2020-03-10 VITALS — BP 118/78 | HR 83 | Ht 64.0 in | Wt 198.1 lb

## 2020-03-10 DIAGNOSIS — I25119 Atherosclerotic heart disease of native coronary artery with unspecified angina pectoris: Secondary | ICD-10-CM | POA: Diagnosis not present

## 2020-03-10 DIAGNOSIS — I255 Ischemic cardiomyopathy: Secondary | ICD-10-CM | POA: Diagnosis not present

## 2020-03-10 DIAGNOSIS — E782 Mixed hyperlipidemia: Secondary | ICD-10-CM | POA: Diagnosis not present

## 2020-03-10 DIAGNOSIS — I5020 Unspecified systolic (congestive) heart failure: Secondary | ICD-10-CM

## 2020-03-10 MED ORDER — CARVEDILOL 6.25 MG PO TABS: 9.3750 mg | ORAL_TABLET | Freq: Two times a day (BID) | ORAL | 3 refills | Status: DC

## 2020-03-10 NOTE — Progress Notes (Signed)
Follow-up Outpatient Visit Date: 03/10/2020  Primary Care Provider: Owens Loffler, MD [place] [address]  Chief Complaint: Follow-up coronary artery disease and cardiomyopathy  HPI:  [name] is a 69 y.o. female with history of multivessel coronary artery disease, recently diagnosed systolic heart failure, hypertension, hyperlipidemia, arthritis, and breast cancer, who presents for follow-up of coronary artery disease and HFrEF.  [name] was last seen in our office 6 weeks ago by Laurann Montana, NP.  She reported resolution of myalgias after discontinuation of fenofibrate and atorvastatin.  Cough had improved with addition of an antihistamine.  He was tolerating evidence-based heart failure therapy consisting of carvedilol, Entresto, and spironolactone well.  She was started on rosuvastatin 5 mg daily.  Echocardiogram last week showed continued severe reduction in LV systolic function, with an EF of 25-30%.  Today, [name] reports that she is doing relatively well.  She has some exertional dyspnea and occasional "twinges" of chest pain that lasts a few seconds.  Overall, she feels better than at prior visits.  She is tolerating her medications well.  She denies palpitations, lightheadedness, and edema.  --------------------------------------------------------------------------------------------------  Past Medical History:  Diagnosis Date  . Allergic rhinitis   . Avulsion fracture of distal fibula 3/10   and sprain; Left  . Breast cancer [facility]) 2004   Left Breast Cancer  . CHF (congestive heart failure) ([place])   . Coronary artery disease    a. 10/2012 ETT: nl; b. 12/2012 Cath: LM nl, LAD min irregs, D1 min irregs, LCX 30p, RCA 65m, RPDA 40p, EF 50%.  . Diastolic dysfunction    a. 12/2012 Echo: EF 50-55%. No rwma. Gr1 DD. Nl RV fxn. Nl PASP.  [name] Kitchen History of hepatitis B    ?--tested + by [employer]  . HLD (hyperlipidemia)   . HTN (hypertension)   .  Osteoarthritis   . Osteopenia    Past Surgical History:  Procedure Laterality Date  . ABDOMINAL HYSTERECTOMY  1985   endometriosis; anatomy  . BREAST SURGERY  10/2002  . CARDIAC CATHETERIZATION  12/19/12   [facility]; EF 50%  . COLONOSCOPY  8/09   Hyperplastic polyp; repeat in 10 years  . HIP FRACTURE SURGERY  06/14/2011   ball replaced (Dr. Earvin Hansen)  . MASTECTOMY Left 2004  . RIGHT/LEFT HEART CATH AND CORONARY ANGIOGRAPHY N/A 12/02/2019   Procedure: RIGHT/LEFT HEART CATH AND CORONARY ANGIOGRAPHY;  Surgeon: [name], MD;  Location: [facility];  Service: Cardiovascular;  Laterality: N/A;  . TONSILLECTOMY  1958    Current Meds  Medication Sig  . aspirin EC 81 MG tablet Take 81 mg by mouth daily.  . Calcium Carbonate-Vitamin D (CALCIUM 600+D) 600-400 MG-UNIT tablet Take 1 tablet by mouth 2 (two) times daily.  . carvedilol (COREG) 6.25 MG tablet Take 1.5 tablets (9.375 mg total) by mouth 2 (two) times daily with a meal.  . cetirizine (ZYRTEC) 10 MG tablet Take 10 mg by mouth at bedtime.  [name] Kitchen FLUoxetine (PROZAC) 20 MG capsule Take 1 capsule by mouth once daily  . fluticasone (FLONASE) 50 MCG/ACT nasal spray Place 1-2 sprays into both nostrils daily as needed for rhinitis.  . furosemide (LASIX) 40 MG tablet Take 1 tablet (40 mg total) by mouth daily.  [name] Kitchen GRAPE SEED EXTRACT PO Take 1 capsule by mouth 2 (two) times daily.   Javier Docker Oil 1000 MG CAPS Take 1,000 mg by mouth at bedtime.  . meclizine (ANTIVERT) 25 MG tablet Take 0.5-1  tablets (12.5-25 mg total) by mouth 3 (three) times daily as needed for dizziness (sedation caution).  . meloxicam (MOBIC) 15 MG tablet TAKE 1 TABLET (15 MG TOTAL) BY MOUTH DAILY AS NEEDED FOR PAIN. WITH FOOD  . Misc Natural Products (OSTEO BI-FLEX ADV JOINT SHIELD) TABS Take 1 tablet by mouth 2 (two) times daily.  . Multiple Vitamin (MULTIVITAMIN WITH MINERALS) TABS tablet Take 1 tablet by mouth daily.  . Multiple Vitamins-Minerals (PRESERVISION AREDS  2+MULTI VIT) CAPS Take 1 capsule by mouth daily.  [name] Kitchen nystatin cream (MYCOSTATIN) Apply 1 application topically 2 (two) times daily.  . rosuvastatin (CRESTOR) 5 MG tablet Take 1 tablet (5 mg total) by mouth daily.  . sacubitril-valsartan (ENTRESTO) 49-51 MG Take 1 tablet by mouth 2 (two) times daily.  [name] Kitchen spironolactone (ALDACTONE) 25 MG tablet Take 1 tablet (25 mg total) by mouth daily.  . [DISCONTINUED] carvedilol (COREG) 6.25 MG tablet Take 1 tablet (6.25 mg total) by mouth 2 (two) times daily with a meal.    Allergies: Sulfur, Ace inhibitors, Codeine, Morphine, and Sulfonamide derivatives  Social History   Tobacco Use  . Smoking status: Former Smoker    Packs/day: 0.50    Years: 10.00    Pack years: 5.00    Types: Cigarettes  . Smokeless tobacco: Former Systems developer    Quit date: 01/24/1986  . Tobacco comment: 1989 quit   Vaping Use  . Vaping Use: Never used  Substance Use Topics  . Alcohol use: No  . Drug use: No    Family History  Problem Relation Age of Onset  . Heart attack Father 83  . Heart disease Father   . Sarcoidosis Mother   . Ovarian cancer Other        Aunt  . Diabetes Other        GP  . Breast cancer Sister 4  . Cancer Sister        breast  . Cancer Paternal Aunt        colon/ovarian    Review of Systems: A 12-system review of systems was performed and was negative except as noted in the HPI.  --------------------------------------------------------------------------------------------------  Physical Exam: BP 118/78 (BP Location: Left Arm, Patient Position: Sitting, Cuff Size: Large)   Pulse 83   Ht 5\' 4"  (1.626 m)   Wt 198 lb 2 oz (89.9 kg)   SpO2 98%   BMI 34.01 kg/m   General: NAD. Neck: No JVD or HJR. Lungs: Clear to auscultation without wheezes or crackles. Heart: Regular rate and rhythm without murmurs, rubs, or gallops. Abdomen: Soft, nontender, nondistended. Extremities: No lower extremity edema.  EKG: Normal sinus rhythm with LVH and  inferior Q waves.  No significant change from prior tracing on 01/23/2020.  Lab Results  Component Value Date   WBC 9.4 12/03/2019   HGB 14.0 12/03/2019   HCT 43.8 12/03/2019   MCV 89.2 12/03/2019   PLT 413 (H) 12/03/2019    Lab Results  Component Value Date   NA 140 12/26/2019   K 4.7 12/26/2019   CL 100 12/26/2019   CO2 24 12/26/2019   BUN 18 12/26/2019   CREATININE 0.75 12/26/2019   GLUCOSE 118 (H) 12/26/2019   ALT 20 12/26/2019    Lab Results  Component Value Date   CHOL 155 11/29/2019   HDL 35 (L) 11/29/2019   LDLCALC 87 11/29/2019   LDLDIRECT 76.0 04/23/2019   TRIG 165 (H) 11/29/2019   CHOLHDL 4.4 11/29/2019    --------------------------------------------------------------------------------------------------  ASSESSMENT AND  PLAN: Coronary artery disease with atypical chest pain: Overall, [name] feels the same as at our prior visits.  Twinges of chest pain would be atypical for coronary artery disease, though she certainly has significant multivessel CAD.  We have reviewed her catheterization images again in the office today, which are notable for relatively small, diffusely diseased coronary arteries including high-grade stenosis of the mid LAD and chronic total occlusion of the mid RCA.  We have discussed continued medical therapy, PCI of the LAD, and myocardial viability study to help guide therapy.  We have agreed to pursue viability study.  In the meantime, we will continue  aspirin and rosuvastatin to prevent progression of disease.  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 appears euvolemic with stable NYHA class III symptoms.  We will increase carvedilol to 9.375 mg twice daily.  Current doses of spironolactone and Entresto will be continued.  As noted above, we will refer [name] for cardiac MRI to assess myocardial viability.  If she has significant viability in the LAD territory, we will need to consider PCI to this vessel, though it  will be high risk and will likely require a long stented segment in a relatively small vessel.  If inferior wall is also viable, staged PCI to CTO of the RCA could be considered down the road.  If LVEF remains severely reduced following revascularization (if appropriate), we will consider referral to EP for discussion of ICD placement.  Hyperlipidemia: Most recent lipid panel in March notable for LDL of 87, just above goal.  We will continue low-dose rosuvastatin, given history of myalgias with multiple statins in the past, and plan to repeat a lipid panel next month to assess response.  Follow-up: Return to clinic in 1 month.  [name], MD 03/10/2020 8:54 PM

## 2020-03-10 NOTE — Telephone Encounter (Signed)
Left message for patient to call to discuss scheduling Cardiac MRI ordered by Laurann Montana, NP

## 2020-03-10 NOTE — Patient Instructions (Signed)
Medication Instructions:  Your physician has recommended you make the following change in your medication:  1- INCREASE Carvedilol to 9.375 mg (1.5 tablet) by mouth two times a day.  *If you need a refill on your cardiac medications before your next appointment, please call your pharmacy*   Lab Work: Your physician recommends that you return for lab work in: once the MRI has been scheduled. Needs to be within 30 days. For BMET.  - Please go to the [facility]. You will check in at the front desk to the right as you walk into the atrium. Valet Parking is offered if needed. - No appointment needed. You may go any day between 7 am and 6 pm.   If you have labs (blood work) drawn today and your tests are completely normal, you will receive your results only by: [name] Kitchen MyChart Message (if you have MyChart) OR . A paper copy in the mail If you have any lab test that is abnormal or we need to change your treatment, we will call you to review the results.   Testing/Procedures: Your physician has requested that you have a cardiac MRI. Cardiac MRI uses a computer to create images of your heart as its beating, producing both still and moving pictures of your heart and major blood vessels. Someone will call you to schedule the MRI once the approval from insurance is received.    Follow-Up: At [facility], you and your health needs are our priority.  As part of our continuing mission to provide you with exceptional heart care, we have created designated Provider Care Teams.  These Care Teams include your primary Cardiologist (physician) and Advanced Practice Providers (APPs -  Physician Assistants and Nurse Practitioners) who all work together to provide you with the care you need, when you need it.  We recommend signing up for the patient portal called "MyChart".  Sign up information is provided on this After Visit Summary.  MyChart is used to connect with patients for Virtual Visits  (Telemedicine).  Patients are able to view lab/test results, encounter notes, upcoming appointments, etc.  Non-urgent messages can be sent to your provider as well.   To learn more about what you can do with MyChart, go to [email or web address].    Your next appointment:   4 week(s)  The format for your next appointment:   In Person  Provider:    You may see [name], MD or one of the following Advanced Practice Providers on your designated Care Team:    Murray Hodgkins, NP  Christell Faith, PA-C  Marrianne Mood, PA-C    Magnetic Resonance Imaging Magnetic resonance imaging (MRI) is a painless test that takes pictures of the inside of your body. This test uses a strong magnet. This test does not use X-rays. What happens before the procedure?  You will be asked about any metal you may have in your body. This includes: ? Any joint replacement (prosthesis), such as an artificial knee or hip. ? Any implanted devices or ports. ? A metallic ear implant (cochlear implant). ? An artificial heart valve. ? A metallic object in the eye. ? Metal splinters. ? Bullet fragments. ? Any tattoos. Some of the darker inks can cause problems with testing.  You will be asked to take off all metal. This includes: ? Your watch, jewelry, and other metal objects. ? Hearing aids. ? Dentures. ? Underwire bra. ? Makeup. Some kinds of makeup contain small amounts of metal. ? Braces and  fillings normally are not a problem.  If you are breastfeeding, ask your doctor if you need to pump before your test and then stop breastfeeding for a short time. You may need to do this if dye (contrast material) will be used during your MRI.  If you have a fear of cramped spaces (claustrophobia), you may be given medicines prior to the MRI. What happens during the procedure?   You may be given earplugs or headphones to listen to music. The MRI machine can be noisy.  You will lie down on a platform. It  looks like a long table.  If dye will be used, an IV tube will be placed into one of your veins. Dye will be given through your IV tube at a certain time as images are taken.  The platform will slide into a tunnel. The tunnel has magnets inside of it. When you are inside the tunnel, you will still be able to talk to your doctor.  The tunnel will scan your body and make images. You will be asked to lie very still. Your doctor will tell you when you can move. You may have to wait a few minutes to make sure that the images from the test are clear.  When all images are taken, the platform will slide out of the tunnel. The procedure may vary among doctors and hospitals. What happens after the procedure?  You may be taken to a recovery area if sedation medicines were used. Your blood pressure, heart rate, breathing rate, and blood oxygen level will be monitored until you leave the hospital or clinic.  If dye was used: ? It will leave your body through your pee (urine). It takes about a day for all of the dye to leave the body. ? You may be told to drink plenty of fluids. This helps your body get rid of the dye. ? If you are breastfeeding, do not breastfeed your child until your doctor says that this is safe.  You may go back to your normal activities right away, or as told by your doctor.  It is up to you to get your test results. Ask your doctor, or the department that is doing the test, when your results will be ready. Summary  Magnetic resonance imaging (MRI) is a test that takes pictures of the inside of your body.  Before your MRI, be sure to tell your doctor about any metal you may have in your body.  You may go back to your normal activities right away, or as told by your doctor. This information is not intended to replace advice given to you by your health care provider. Make sure you discuss any questions you have with your health care provider. Document Revised: 07/23/2017 Document  Reviewed: 07/23/2017 Elsevier Patient Education  2020 [employer].

## 2020-03-11 NOTE — Addendum Note (Signed)
Addended by: Britt Bottom on: 03/11/2020 09:24 AM   Modules accepted: Orders

## 2020-03-16 ENCOUNTER — Encounter: Payer: Self-pay | Admitting: Family

## 2020-03-16 NOTE — Telephone Encounter (Signed)
Left message regarding appointment for Cardiac MRI scheduled 04/13/20 at 12:00pm at [facility]---arrival time is 11:30 am 1st floor admissions office.  Will mail information to patient and also asked patient to call with questions or concerns.

## 2020-04-01 ENCOUNTER — Telehealth: Payer: Self-pay | Admitting: *Deleted

## 2020-04-01 ENCOUNTER — Other Ambulatory Visit
Admission: RE | Admit: 2020-04-01 | Discharge: 2020-04-01 | Disposition: A | Discharge status: Home / Self Care | Payer: Medicare Other | Attending: Internal Medicine | Admitting: Internal Medicine

## 2020-04-01 DIAGNOSIS — I255 Ischemic cardiomyopathy: Secondary | ICD-10-CM | POA: Diagnosis not present

## 2020-04-01 DIAGNOSIS — I5020 Unspecified systolic (congestive) heart failure: Secondary | ICD-10-CM | POA: Diagnosis not present

## 2020-04-01 DIAGNOSIS — I25119 Atherosclerotic heart disease of native coronary artery with unspecified angina pectoris: Secondary | ICD-10-CM | POA: Diagnosis not present

## 2020-04-01 LAB — BASIC METABOLIC PANEL
Anion gap: 11 (ref 5–15)
BUN: 16 mg/dL (ref 8–23)
CO2: 27 mmol/L (ref 22–32)
Calcium: 9.6 mg/dL (ref 8.9–10.3)
Chloride: 101 mmol/L (ref 98–111)
Creatinine, Ser: 0.66 mg/dL (ref 0.44–1.00)
GFR calc Af Amer: >60 mL/min (ref >60)
GFR calc non Af Amer: >60 mL/min (ref >60)
Glucose, Bld: 118 mg/dL — ABNORMAL HIGH (ref 70–99)
Potassium: 4.2 mmol/L (ref 3.5–5.1)
Sodium: 139 mmol/L (ref 135–145)

## 2020-04-01 NOTE — Telephone Encounter (Signed)
-----   Message from [name], MD sent at 04/01/2020 10:41 AM EDT ----- Normal renal function and electrolytes.  She should continue her current medications and proceed with cardiac MRI, as planned.

## 2020-04-01 NOTE — Telephone Encounter (Signed)
Left voicemail message to call back for any questions regarding her labs sent to My Chart

## 2020-04-02 NOTE — Telephone Encounter (Signed)
Left a voicemail message to call back regarding lab results.

## 2020-04-07 ENCOUNTER — Ambulatory Visit: Payer: PRIVATE HEALTH INSURANCE | Admitting: Family

## 2020-04-12 ENCOUNTER — Telehealth (HOSPITAL_COMMUNITY): Payer: Self-pay | Admitting: *Deleted

## 2020-04-12 NOTE — Telephone Encounter (Signed)
Reaching out to patient to offer assistance regarding upcoming cardiac imaging study; pt verbalizes understanding of appt date/time, parking situation and where to check in, and verified current allergies; name and call back number provided for further questions should they arise ° °Laylynn Campanella Tai RN Navigator Cardiac Imaging °[facility] °[phone] office °[phone] cell ° °

## 2020-04-13 ENCOUNTER — Other Ambulatory Visit: Payer: Self-pay

## 2020-04-13 ENCOUNTER — Ambulatory Visit (HOSPITAL_COMMUNITY)
Admission: RE | Admit: 2020-04-13 | Discharge: 2020-04-13 | Disposition: A | Discharge status: Home / Self Care | Payer: Medicare Other | Source: Ambulatory Visit | Attending: Internal Medicine | Admitting: Internal Medicine

## 2020-04-13 DIAGNOSIS — I255 Ischemic cardiomyopathy: Secondary | ICD-10-CM | POA: Insufficient documentation

## 2020-04-13 DIAGNOSIS — I25119 Atherosclerotic heart disease of native coronary artery with unspecified angina pectoris: Secondary | ICD-10-CM | POA: Diagnosis not present

## 2020-04-13 DIAGNOSIS — I5020 Unspecified systolic (congestive) heart failure: Secondary | ICD-10-CM

## 2020-04-13 MED ORDER — GADOBUTROL 1 MMOL/ML IV SOLN
8.0000 mL | Freq: Once | INTRAVENOUS | Status: AC | PRN
  Administered 2020-04-13: 8 mL via INTRAVENOUS

## 2020-04-16 ENCOUNTER — Encounter: Payer: Self-pay | Admitting: Family

## 2020-04-16 ENCOUNTER — Ambulatory Visit (INDEPENDENT_AMBULATORY_CARE_PROVIDER_SITE_OTHER): Payer: Medicare Other | Admitting: Family

## 2020-04-16 ENCOUNTER — Other Ambulatory Visit: Payer: Self-pay

## 2020-04-16 VITALS — BP 120/90 | HR 70 | Ht 64.0 in | Wt 201.4 lb

## 2020-04-16 DIAGNOSIS — I255 Ischemic cardiomyopathy: Secondary | ICD-10-CM | POA: Diagnosis not present

## 2020-04-16 DIAGNOSIS — I25119 Atherosclerotic heart disease of native coronary artery with unspecified angina pectoris: Secondary | ICD-10-CM

## 2020-04-16 DIAGNOSIS — I5020 Unspecified systolic (congestive) heart failure: Secondary | ICD-10-CM

## 2020-04-16 DIAGNOSIS — E785 Hyperlipidemia, unspecified: Secondary | ICD-10-CM

## 2020-04-16 NOTE — Patient Instructions (Addendum)
Medication Instructions:  No medication changes today.   *If you need a refill on your cardiac medications before your next appointment, please call your pharmacy*   Lab Work: No lab work today.    Testing/Procedures: Your EKG today was stable compared to previous.    Follow-Up: At [facility], you and your health needs are our priority.  As part of our continuing mission to provide you with exceptional heart care, we have created designated Provider Care Teams.  These Care Teams include your primary Cardiologist (physician) and Advanced Practice Providers (APPs -  Physician Assistants and Nurse Practitioners) who all work together to provide you with the care you need, when you need it.  We recommend signing up for the patient portal called "MyChart".  Sign up information is provided on this After Visit Summary.  MyChart is used to connect with patients for Virtual Visits (Telemedicine).  Patients are able to view lab/test results, encounter notes, upcoming appointments, etc.  Non-urgent messages can be sent to your provider as well.   To learn more about what you can do with MyChart, go to [email or web address].    Your next appointment:  To be determined based on results of cardiac MRI.

## 2020-04-16 NOTE — H&P (View-Only) (Signed)
Office Visit    Patient Name: [name] [name] Date of Encounter: 04/16/2020  Primary Care Provider:  Owens Loffler, MD Primary Cardiologist:  [name], MD Electrophysiologist:  None   Chief Complaint    [name] [name] is a 69 y.o. female with a hx of multivessel CAD, HFrEF, HTN, HLD, arthritis, breast cancer presents today for follow-up of HFrEF  Past Medical History    Past Medical History:  Diagnosis Date  . Allergic rhinitis   . Avulsion fracture of distal fibula 3/10   and sprain; Left  . Breast cancer [facility]) 2004   Left Breast Cancer  . CHF (congestive heart failure) ([place])   . Coronary artery disease    a. 10/2012 ETT: nl; b. 12/2012 Cath: LM nl, LAD min irregs, D1 min irregs, LCX 30p, RCA 72m, RPDA 40p, EF 50%.  . Diastolic dysfunction    a. 12/2012 Echo: EF 50-55%. No rwma. Gr1 DD. Nl RV fxn. Nl PASP.  [name] Kitchen History of hepatitis B    ?--tested + by [employer]  . HLD (hyperlipidemia)   . HTN (hypertension)   . Osteoarthritis   . Osteopenia    Past Surgical History:  Procedure Laterality Date  . ABDOMINAL HYSTERECTOMY  1985   endometriosis; anatomy  . BREAST SURGERY  10/2002  . CARDIAC CATHETERIZATION  12/19/12   [facility]; EF 50%  . COLONOSCOPY  8/09   Hyperplastic polyp; repeat in 10 years  . HIP FRACTURE SURGERY  06/14/2011   ball replaced (Dr. Earvin Hansen)  . MASTECTOMY Left 2004  . RIGHT/LEFT HEART CATH AND CORONARY ANGIOGRAPHY N/A 12/02/2019   Procedure: RIGHT/LEFT HEART CATH AND CORONARY ANGIOGRAPHY;  Surgeon: [name], MD;  Location: [facility];  Service: Cardiovascular;  Laterality: N/A;  . TONSILLECTOMY  1958    Allergies  Allergies  Allergen Reactions  . Sulfur Rash  . Ace Inhibitors Cough  . Codeine Other (See Comments)    Reation:  Hallucinations   . Morphine Other (See Comments)    Reaction:  Hallucinations   . Sulfonamide Derivatives Rash    History of Present Illness    [name] [name] is a 69 y.o. female with a hx of  multivessel CAD, HFrEF, HTN, HLD, arthritis, breast cancer last seen 03/10/20 by Dr. Saunders Revel.  Hospitalized March 2021 with exertional dyspnea and noted newly reduced LVEF 25-30% by echo. Treated medically with diuresis and GDMT.  Cardiac catheterization with significant two-vessel CAD with diffuse LAD stenosis of 70-80% in the mid to distal vessel and chronic subtotal occlusion of the mid RCA with left-to-right collaterals.  Left and right heart filling pressures remained moderately elevated in setting of reduced cardiac output.  Medical therapy was recommended as small vessel size and diffuse disease limited revascularization options.  Discharged on atorvastatin, aspirin, carvedilol, fenofibrate, furosemide, Entresto, spironolactone.  Follow-up 12/26/2019 she noted myalgias and her fenofibrate and statin were discontinued.  She was transitioned from losartan to Entresto approximately 12/09/2019. She did have mild cough, but was started on antihistamine for allergies and cough resolved. 01/23/20 she was started on Rosuvastatin and has tolerated without recurrent myalgias.   Repeat echo 03/01/20 with continued LVEF 42-68%, grade 2 diastolic dysfunction.  When seen in clinic 03/10/20 cardiac MRI was ordered for viability study. Her Coreg was up-titrated for further optimization of GDMT. Cardiac MRI performed 04/13/20 and is awaiting read.  Presents today with her niece. Reports feeling overall well. Reports no shortness of breath. Reports only mild dyspnea on exertion  with more than usual activity. Reports no chest pain, pressure, or tightness. No edema, orthopnea, PND. Reports no palpitations.   Monitors her weights carefully at home and reports they have been stable.  Endorses eating a low-sodium diet.  Endorses fluid intake of less than 2 L.  She has 3 family members that live with her and help her adhere to her dietary goals.  She has been trying to walk for exercise.    EKGs/Labs/Other Studies Reviewed:   The  following studies were reviewed today: Echo 11/29/19  1. Left ventricular ejection fraction, by estimation, is 25 to 30%. The  left ventricle has severely decreased function. The left ventricle  demonstrates global hypokinesis. The left ventricular internal cavity size  was mildly dilated. There is mild left  ventricular hypertrophy. Left ventricular diastolic parameters are  consistent with Grade I diastolic dysfunction (impaired relaxation).   2. Right ventricular systolic function is normal. The right ventricular  size is normal. Tricuspid regurgitation signal is inadequate for assessing  PA pressure.   3. Left atrial size was mildly dilated.   4. The inferior vena cava is normal in size with greater than 50%  respiratory variability, suggesting right atrial pressure of 3 mmHg.   R/LHC 12/02/19 Conclusions: 1. Significant two-vessel coronary artery disease with diffuse LAD disease of up to 70-80% in the mid/distal vessel and chronic subtotal occlusion of the mid RCA with left-to-right collaterals. 2. Moderately elevated left heart, right heart, and pulmonary artery pressures. 3. Low normal Fick cardiac output/index.   Recommendations: 1. Escalate diuresis and optimize evidence-based heart failure therapy. 2. Medical management of coronary artery disease.  Small vessel size and diffuse disease involving LAD and RCA make percutaneous and surgical revascularization suboptimal.   EKG:  EKG is ordered today.  The ekg ordered today demonstrates NSR 70 bpm with LVH and inferior Q waves.   Recent Labs: 04/23/2019: TSH 2.34 11/28/2019: B Natriuretic Peptide 188.0 12/03/2019: Hemoglobin 14.0; Magnesium 2.2; Platelets 413 12/26/2019: ALT 20 04/01/2020: BUN 16; Creatinine, Ser 0.66; Potassium 4.2; Sodium 139  Recent Lipid Panel    Component Value Date/Time   CHOL 155 11/29/2019 0558   TRIG 165 (H) 11/29/2019 0558   HDL 35 (L) 11/29/2019 0558   CHOLHDL 4.4 11/29/2019 0558   VLDL 33 11/29/2019  0558   LDLCALC 87 11/29/2019 0558   LDLDIRECT 76.0 04/23/2019 1047    Home Medications   Current Meds  Medication Sig  . aspirin EC 81 MG tablet Take 81 mg by mouth daily.  . Calcium Carbonate-Vitamin D (CALCIUM 600+D) 600-400 MG-UNIT tablet Take 1 tablet by mouth 2 (two) times daily.  . carvedilol (COREG) 6.25 MG tablet Take 1.5 tablets (9.375 mg total) by mouth 2 (two) times daily with a meal.  . cetirizine (ZYRTEC) 10 MG tablet Take 10 mg by mouth at bedtime.  [name] Kitchen FLUoxetine (PROZAC) 20 MG capsule Take 1 capsule by mouth once daily  . fluticasone (FLONASE) 50 MCG/ACT nasal spray Place 1-2 sprays into both nostrils daily as needed for rhinitis.  . furosemide (LASIX) 40 MG tablet Take 1 tablet (40 mg total) by mouth daily.  [name] Kitchen GRAPE SEED EXTRACT PO Take 1 capsule by mouth 2 (two) times daily.   Javier Docker Oil 1000 MG CAPS Take 1,000 mg by mouth at bedtime.  . meclizine (ANTIVERT) 25 MG tablet Take 0.5-1 tablets (12.5-25 mg total) by mouth 3 (three) times daily as needed for dizziness (sedation caution).  . meloxicam (MOBIC) 15 MG tablet TAKE 1  TABLET (15 MG TOTAL) BY MOUTH DAILY AS NEEDED FOR PAIN. WITH FOOD  . Misc Natural Products (OSTEO BI-FLEX ADV JOINT SHIELD) TABS Take 1 tablet by mouth 2 (two) times daily.  . Multiple Vitamin (MULTIVITAMIN WITH MINERALS) TABS tablet Take 1 tablet by mouth daily.  . Multiple Vitamins-Minerals (PRESERVISION AREDS 2+MULTI VIT) CAPS Take 1 capsule by mouth daily.  [name] Kitchen nystatin cream (MYCOSTATIN) Apply 1 application topically 2 (two) times daily.  . rosuvastatin (CRESTOR) 5 MG tablet Take 1 tablet (5 mg total) by mouth daily.  . sacubitril-valsartan (ENTRESTO) 49-51 MG Take 1 tablet by mouth 2 (two) times daily.  [name] Kitchen spironolactone (ALDACTONE) 25 MG tablet Take 1 tablet (25 mg total) by mouth daily.      Review of Systems   Review of Systems  Constitution: Negative for chills, fever and malaise/fatigue.  Cardiovascular: Positive for dyspnea on exertion.  Negative for chest pain, leg swelling, near-syncope, orthopnea, palpitations and syncope.  Respiratory: Negative for cough, shortness of breath and wheezing.   Gastrointestinal: Negative for nausea and vomiting.  Neurological: Negative for dizziness, light-headedness and weakness.   All other systems reviewed and are otherwise negative except as noted above.  Physical Exam    VS:  BP 120/90 (BP Location: Left Arm, Patient Position: Sitting, Cuff Size: Large)   Pulse 70   Ht 5\' 4"  (1.626 m)   Wt 201 lb 6 oz (91.3 kg)   SpO2 96%   BMI 34.57 kg/m  , BMI Body mass index is 34.57 kg/m. GEN: Well nourished, overweight, well developed, in no acute distress. HEENT: normal. Neck: Supple, no JVD, carotid bruits, or masses. Cardiac: RRR, no murmurs, rubs, or gallops. No clubbing, cyanosis, edema.  Radials/DP/PT 2+ and equal bilaterally.  Respiratory:  Respirations regular and unlabored, clear to auscultation bilaterally. GI: Soft, nontender, nondistended, BS + x 4. MS: No deformity or atrophy. Skin: Warm and dry, no rash. Neuro:  Strength and sensation are intact. Psych: Normal affect.  Assessment & Plan    1. Chronic HFpEF -Euvolemic, compensated on exam.  NYHA II symptoms of dyspnea.  Present GDMT includes Coreg 9.375 twice daily, Lasix 40 mg daily, spironolactone 25 mg daily, Entresto 49 -51 mg twice daily.  Repeat echo 02/2020 with continued EF 25-30% and gr2DD. Cardiac MRI for viability study performed 04/13/20 and await reading. If cardiac viability of LAD or RCA noted, she prefers to proceed with intervention. The patient understands that risks include but are not limited to stroke (1 in 1000), death (1 in 23), kidney failure [usually temporary] (1 in 500), bleeding (1 in 200), allergic reaction [possibly serious] (1 in 200), and agrees to proceed. If cardiac viability not noted, plan for addition of Farxiga vs Hydralazine for continued optimization of HF therapies.  2. CAD -Reports no  angina though known to significant two-vessel CAD likely driving cardiomyopathy.  Severe diffuse disease of mid/distal LAD as well as chronic subtotal occlusion of RCA which are not optimal for revascularization. GDMT include aspirin, beta blocker, statin. Per Dr. Saunders Revel - if significant viability in LAD territory consider PCI (high risk and will require long stented segment of small vessel). If inferior wall viable, staged PCI to CTO of RCA could be considered. If LVEF remains reduced despite revascularization if completed, consider referral to EP for ICD.  3. HLD - 11/2019 LDL 87. Continue Rosuvastatin 5mg  daily due to myalgias with multiple statins in the past. Plan for repeat labs at follow up  Disposition: Follow up pending results of  cardiac MRI with Dr. Saunders Revel or APP.  If cardiac MRI shows viability, plan for PCI and pre-procedure labs to include CMP, CBC, lipid panel. If cardiac MRI does not show viability, plan to start additional GDMT with repeat CMP and lipid panel in 1-2 weeks.  Loel Dubonnet, NP 04/16/2020, 1:21 PM

## 2020-04-16 NOTE — Progress Notes (Signed)
Office Visit    Patient Name: [name] [name] Date of Encounter: 04/16/2020  Primary Care Provider:  Owens Loffler, MD Primary Cardiologist:  [name], MD Electrophysiologist:  None   Chief Complaint    [name] [name] is a 69 y.o. female with a hx of multivessel CAD, HFrEF, HTN, HLD, arthritis, breast cancer presents today for follow-up of HFrEF  Past Medical History    Past Medical History:  Diagnosis Date  . Allergic rhinitis   . Avulsion fracture of distal fibula 3/10   and sprain; Left  . Breast cancer [facility]) 2004   Left Breast Cancer  . CHF (congestive heart failure) ([place])   . Coronary artery disease    a. 10/2012 ETT: nl; b. 12/2012 Cath: LM nl, LAD min irregs, D1 min irregs, LCX 30p, RCA 26m, RPDA 40p, EF 50%.  . Diastolic dysfunction    a. 12/2012 Echo: EF 50-55%. No rwma. Gr1 DD. Nl RV fxn. Nl PASP.  [name] Kitchen History of hepatitis B    ?--tested + by [employer]  . HLD (hyperlipidemia)   . HTN (hypertension)   . Osteoarthritis   . Osteopenia    Past Surgical History:  Procedure Laterality Date  . ABDOMINAL HYSTERECTOMY  1985   endometriosis; anatomy  . BREAST SURGERY  10/2002  . CARDIAC CATHETERIZATION  12/19/12   [facility]; EF 50%  . COLONOSCOPY  8/09   Hyperplastic polyp; repeat in 10 years  . HIP FRACTURE SURGERY  06/14/2011   ball replaced (Dr. Earvin Hansen)  . MASTECTOMY Left 2004  . RIGHT/LEFT HEART CATH AND CORONARY ANGIOGRAPHY N/A 12/02/2019   Procedure: RIGHT/LEFT HEART CATH AND CORONARY ANGIOGRAPHY;  Surgeon: [name], MD;  Location: [place] [facility];  Service: Cardiovascular;  Laterality: N/A;  . TONSILLECTOMY  1958    Allergies  Allergies  Allergen Reactions  . Sulfur Rash  . Ace Inhibitors Cough  . Codeine Other (See Comments)    Reation:  Hallucinations   . Morphine Other (See Comments)    Reaction:  Hallucinations   . Sulfonamide Derivatives Rash    History of Present Illness    [name] [name] is a 69 y.o. female with a hx of  multivessel CAD, HFrEF, HTN, HLD, arthritis, breast cancer last seen 03/10/20 by Dr. Saunders Revel.  Hospitalized March 2021 with exertional dyspnea and noted newly reduced LVEF 25-30% by echo. Treated medically with diuresis and GDMT.  Cardiac catheterization with significant two-vessel CAD with diffuse LAD stenosis of 70-80% in the mid to distal vessel and chronic subtotal occlusion of the mid RCA with left-to-right collaterals.  Left and right heart filling pressures remained moderately elevated in setting of reduced cardiac output.  Medical therapy was recommended as small vessel size and diffuse disease limited revascularization options.  Discharged on atorvastatin, aspirin, carvedilol, fenofibrate, furosemide, Entresto, spironolactone.  Follow-up 12/26/2019 she noted myalgias and her fenofibrate and statin were discontinued.  She was transitioned from losartan to Entresto approximately 12/09/2019. She did have mild cough, but was started on antihistamine for allergies and cough resolved. 01/23/20 she was started on Rosuvastatin and has tolerated without recurrent myalgias.   Repeat echo 03/01/20 with continued LVEF 47-42%, grade 2 diastolic dysfunction.  When seen in clinic 03/10/20 cardiac MRI was ordered for viability study. Her Coreg was up-titrated for further optimization of GDMT. Cardiac MRI performed 04/13/20 and is awaiting read.  Presents today with her niece. Reports feeling overall well. Reports no shortness of breath. Reports only mild dyspnea on exertion  with more than usual activity. Reports no chest pain, pressure, or tightness. No edema, orthopnea, PND. Reports no palpitations.   Monitors her weights carefully at home and reports they have been stable.  Endorses eating a low-sodium diet.  Endorses fluid intake of less than 2 L.  She has 3 family members that live with her and help her adhere to her dietary goals.  She has been trying to walk for exercise.    EKGs/Labs/Other Studies Reviewed:   The  following studies were reviewed today: Echo 11/29/19  1. Left ventricular ejection fraction, by estimation, is 25 to 30%. The  left ventricle has severely decreased function. The left ventricle  demonstrates global hypokinesis. The left ventricular internal cavity size  was mildly dilated. There is mild left  ventricular hypertrophy. Left ventricular diastolic parameters are  consistent with Grade I diastolic dysfunction (impaired relaxation).   2. Right ventricular systolic function is normal. The right ventricular  size is normal. Tricuspid regurgitation signal is inadequate for assessing  PA pressure.   3. Left atrial size was mildly dilated.   4. The inferior vena cava is normal in size with greater than 50%  respiratory variability, suggesting right atrial pressure of 3 mmHg.   R/LHC 12/02/19 Conclusions: 1. Significant two-vessel coronary artery disease with diffuse LAD disease of up to 70-80% in the mid/distal vessel and chronic subtotal occlusion of the mid RCA with left-to-right collaterals. 2. Moderately elevated left heart, right heart, and pulmonary artery pressures. 3. Low normal Fick cardiac output/index.   Recommendations: 1. Escalate diuresis and optimize evidence-based heart failure therapy. 2. Medical management of coronary artery disease.  Small vessel size and diffuse disease involving LAD and RCA make percutaneous and surgical revascularization suboptimal.   EKG:  EKG is ordered today.  The ekg ordered today demonstrates NSR 70 bpm with LVH and inferior Q waves.   Recent Labs: 04/23/2019: TSH 2.34 11/28/2019: B Natriuretic Peptide 188.0 12/03/2019: Hemoglobin 14.0; Magnesium 2.2; Platelets 413 12/26/2019: ALT 20 04/01/2020: BUN 16; Creatinine, Ser 0.66; Potassium 4.2; Sodium 139  Recent Lipid Panel    Component Value Date/Time   CHOL 155 11/29/2019 0558   TRIG 165 (H) 11/29/2019 0558   HDL 35 (L) 11/29/2019 0558   CHOLHDL 4.4 11/29/2019 0558   VLDL 33 11/29/2019  0558   LDLCALC 87 11/29/2019 0558   LDLDIRECT 76.0 04/23/2019 1047    Home Medications   Current Meds  Medication Sig  . aspirin EC 81 MG tablet Take 81 mg by mouth daily.  . Calcium Carbonate-Vitamin D (CALCIUM 600+D) 600-400 MG-UNIT tablet Take 1 tablet by mouth 2 (two) times daily.  . carvedilol (COREG) 6.25 MG tablet Take 1.5 tablets (9.375 mg total) by mouth 2 (two) times daily with a meal.  . cetirizine (ZYRTEC) 10 MG tablet Take 10 mg by mouth at bedtime.  [name] Kitchen FLUoxetine (PROZAC) 20 MG capsule Take 1 capsule by mouth once daily  . fluticasone (FLONASE) 50 MCG/ACT nasal spray Place 1-2 sprays into both nostrils daily as needed for rhinitis.  . furosemide (LASIX) 40 MG tablet Take 1 tablet (40 mg total) by mouth daily.  [name] Kitchen GRAPE SEED EXTRACT PO Take 1 capsule by mouth 2 (two) times daily.   Javier Docker Oil 1000 MG CAPS Take 1,000 mg by mouth at bedtime.  . meclizine (ANTIVERT) 25 MG tablet Take 0.5-1 tablets (12.5-25 mg total) by mouth 3 (three) times daily as needed for dizziness (sedation caution).  . meloxicam (MOBIC) 15 MG tablet TAKE 1  TABLET (15 MG TOTAL) BY MOUTH DAILY AS NEEDED FOR PAIN. WITH FOOD  . Misc Natural Products (OSTEO BI-FLEX ADV JOINT SHIELD) TABS Take 1 tablet by mouth 2 (two) times daily.  . Multiple Vitamin (MULTIVITAMIN WITH MINERALS) TABS tablet Take 1 tablet by mouth daily.  . Multiple Vitamins-Minerals (PRESERVISION AREDS 2+MULTI VIT) CAPS Take 1 capsule by mouth daily.  [name] Kitchen nystatin cream (MYCOSTATIN) Apply 1 application topically 2 (two) times daily.  . rosuvastatin (CRESTOR) 5 MG tablet Take 1 tablet (5 mg total) by mouth daily.  . sacubitril-valsartan (ENTRESTO) 49-51 MG Take 1 tablet by mouth 2 (two) times daily.  [name] Kitchen spironolactone (ALDACTONE) 25 MG tablet Take 1 tablet (25 mg total) by mouth daily.      Review of Systems   Review of Systems  Constitution: Negative for chills, fever and malaise/fatigue.  Cardiovascular: Positive for dyspnea on exertion.  Negative for chest pain, leg swelling, near-syncope, orthopnea, palpitations and syncope.  Respiratory: Negative for cough, shortness of breath and wheezing.   Gastrointestinal: Negative for nausea and vomiting.  Neurological: Negative for dizziness, light-headedness and weakness.   All other systems reviewed and are otherwise negative except as noted above.  Physical Exam    VS:  BP 120/90 (BP Location: Left Arm, Patient Position: Sitting, Cuff Size: Large)   Pulse 70   Ht 5\' 4"  (1.626 m)   Wt 201 lb 6 oz (91.3 kg)   SpO2 96%   BMI 34.57 kg/m  , BMI Body mass index is 34.57 kg/m. GEN: Well nourished, overweight, well developed, in no acute distress. HEENT: normal. Neck: Supple, no JVD, carotid bruits, or masses. Cardiac: RRR, no murmurs, rubs, or gallops. No clubbing, cyanosis, edema.  Radials/DP/PT 2+ and equal bilaterally.  Respiratory:  Respirations regular and unlabored, clear to auscultation bilaterally. GI: Soft, nontender, nondistended, BS + x 4. MS: No deformity or atrophy. Skin: Warm and dry, no rash. Neuro:  Strength and sensation are intact. Psych: Normal affect.  Assessment & Plan    1. Chronic HFpEF -Euvolemic, compensated on exam.  NYHA II symptoms of dyspnea.  Present GDMT includes Coreg 9.375 twice daily, Lasix 40 mg daily, spironolactone 25 mg daily, Entresto 49 -51 mg twice daily.  Repeat echo 02/2020 with continued EF 25-30% and gr2DD. Cardiac MRI for viability study performed 04/13/20 and await reading. If cardiac viability of LAD or RCA noted, she prefers to proceed with intervention. The patient understands that risks include but are not limited to stroke (1 in 1000), death (1 in 27), kidney failure [usually temporary] (1 in 500), bleeding (1 in 200), allergic reaction [possibly serious] (1 in 200), and agrees to proceed. If cardiac viability not noted, plan for addition of Farxiga vs Hydralazine for continued optimization of HF therapies.  2. CAD -Reports no  angina though known to significant two-vessel CAD likely driving cardiomyopathy.  Severe diffuse disease of mid/distal LAD as well as chronic subtotal occlusion of RCA which are not optimal for revascularization. GDMT include aspirin, beta blocker, statin. Per Dr. Saunders Revel - if significant viability in LAD territory consider PCI (high risk and will require long stented segment of small vessel). If inferior wall viable, staged PCI to CTO of RCA could be considered. If LVEF remains reduced despite revascularization if completed, consider referral to EP for ICD.  3. HLD - 11/2019 LDL 87. Continue Rosuvastatin 5mg  daily due to myalgias with multiple statins in the past. Plan for repeat labs at follow up  Disposition: Follow up pending results of  cardiac MRI with Dr. Saunders Revel or APP.  If cardiac MRI shows viability, plan for PCI and pre-procedure labs to include CMP, CBC, lipid panel. If cardiac MRI does not show viability, plan to start additional GDMT with repeat CMP and lipid panel in 1-2 weeks.  Loel Dubonnet, NP 04/16/2020, 1:21 PM

## 2020-04-19 ENCOUNTER — Telehealth: Payer: Self-pay | Admitting: Internal Medicine

## 2020-04-19 DIAGNOSIS — I25119 Atherosclerotic heart disease of native coronary artery with unspecified angina pectoris: Secondary | ICD-10-CM

## 2020-04-19 DIAGNOSIS — Z01810 Encounter for preprocedural cardiovascular examination: Secondary | ICD-10-CM

## 2020-04-19 DIAGNOSIS — I5022 Chronic systolic (congestive) heart failure: Secondary | ICD-10-CM

## 2020-04-19 NOTE — Telephone Encounter (Signed)
Results of cardiac MRI reviewed by phone with Ms. Charette, confirming LVEF < 35%.  RCA territory shows transmural scar but myocardium otherwise remains viable.  We have discussed medical therapy and PCI to the LAD and have agreed to attempt PCI, as her LVEF and DOE have not recovered with optimal medical therapy.  I have reviewed the risks, indications, and alternatives to cardiac catheterization, possible angioplasty, and stenting with the patient. Risks include but are not limited to bleeding, infection, vascular injury, stroke, myocardial infection, arrhythmia, kidney injury, radiation-related injury in the case of prolonged fluoroscopy use, emergency cardiac surgery, and death. The patient understands the risks of serious complication is 1-2 in 7628 with diagnostic cardiac cath and 1-2% or less with angioplasty/stenting.  We will start clopidogrel 75 mg daily and arrange for PCI to the LAD at [facility] at Ms. Caravello's convenience.  [name], MD [facility]

## 2020-04-20 MED ORDER — CLOPIDOGREL BISULFATE 75 MG PO TABS: 75.0000 mg | ORAL_TABLET | Freq: Every day | ORAL | 6 refills | Status: DC

## 2020-04-20 NOTE — Addendum Note (Signed)
Addended by: Annia Belt on: 04/20/2020 02:22 PM   Modules accepted: Orders

## 2020-04-20 NOTE — Telephone Encounter (Signed)
Spoke with patient to confirm if she had test done and that her results were normal. She confirmed testing was done and no further questions at this time.

## 2020-04-20 NOTE — Telephone Encounter (Signed)
Spoke with patient.  She is agreeable to have the procedure on 05/03/20 at [facility]. Confirmed with Santiago Glad in cath lab PCI to LAD on 05/03/20 at 7:30 am with arrival of 5:30 am to Short Stay at [facility].  Patient scheduled with [facility] for COVID test on 04/29/20.  Patient verbalized understanding of all the below and above instructions. These instructions also sent to patient via MyChart message.     [address], [place] [address] Dept: [phone] Loc: [place]  04/20/2020  You are scheduled for a Cardiac Catheterization on Monday, August 23 with Dr. Harrell Gave End.  1. Please arrive at the [facility] (Main Entrance A) at [facility] [place], [place] [address] at 5:30 AM (This time is two hours before your procedure to ensure your preparation). Free valet parking service is available.   Special note: Every effort is made to have your procedure done on time. Please understand that emergencies sometimes delay scheduled procedures.  2. Diet: Do not eat solid foods after midnight.  The patient may have clear liquids until 5am upon the day of the procedure.  3. Labs: You will need to have blood drawn on Thursday, August 19 at [facility], Go to 1st desk on your right to register.  Address: [address], [place] [address]  Open: 8am - 5pm  Phone: [phone]. You do not need to be fasting.  COVID PRE- TEST: You will need a COVID TEST prior to the procedure:  LOCATION: [address].  DATE/TIME:  Thursday, April 29, 2020 between 8 am and 1 pm.    4. Medication instructions in preparation for your procedure:   Contrast Allergy: No  Do not take your furosemide and spironolactone the morning of the procedure.   On the morning of your procedure, take your Plavix/Clopidogrel  and any morning medicines NOT listed above.  You may use sips of water.  5. Plan for one night stay--bring personal belongings. 6. Bring a current list of your medications and current insurance cards. 7. You MUST have a responsible person to drive you home. 8. Someone MUST be with you the first 24 hours after you arrive home or your discharge will be delayed. 9. Please wear clothes that are easy to get on and off and wear slip-on shoes.  Thank you for allowing [place] to care for you!   -- [facility]

## 2020-04-26 ENCOUNTER — Other Ambulatory Visit: Payer: Self-pay | Admitting: Family

## 2020-04-29 ENCOUNTER — Other Ambulatory Visit
Admission: RE | Admit: 2020-04-29 | Discharge: 2020-04-29 | Disposition: A | Discharge status: Home / Self Care | Payer: Medicare Other | Source: Ambulatory Visit | Attending: Surgery | Admitting: Surgery

## 2020-04-29 ENCOUNTER — Other Ambulatory Visit: Payer: Self-pay

## 2020-04-29 ENCOUNTER — Telehealth: Payer: Self-pay | Admitting: *Deleted

## 2020-04-29 ENCOUNTER — Other Ambulatory Visit
Admission: RE | Admit: 2020-04-29 | Discharge: 2020-04-29 | Disposition: A | Discharge status: Home / Self Care | Payer: Medicare Other | Attending: Internal Medicine | Admitting: Internal Medicine

## 2020-04-29 DIAGNOSIS — Z01810 Encounter for preprocedural cardiovascular examination: Secondary | ICD-10-CM | POA: Diagnosis not present

## 2020-04-29 DIAGNOSIS — I25119 Atherosclerotic heart disease of native coronary artery with unspecified angina pectoris: Secondary | ICD-10-CM | POA: Insufficient documentation

## 2020-04-29 DIAGNOSIS — Z20822 Contact with and (suspected) exposure to covid-19: Secondary | ICD-10-CM | POA: Insufficient documentation

## 2020-04-29 DIAGNOSIS — I5022 Chronic systolic (congestive) heart failure: Secondary | ICD-10-CM | POA: Insufficient documentation

## 2020-04-29 LAB — CBC WITH DIFFERENTIAL/PLATELET
Abs Immature Granulocytes: 0.05 10*3/uL (ref 0.00–0.07)
Basophils Absolute: 0.1 10*3/uL (ref 0.0–0.1)
Basophils Relative: 0 %
Eosinophils Absolute: 0.2 10*3/uL (ref 0.0–0.5)
Eosinophils Relative: 2 %
HCT: 44.8 % (ref 36.0–46.0)
Hemoglobin: 14.5 g/dL (ref 12.0–15.0)
Immature Granulocytes: 0 %
Lymphocytes Relative: 20 %
Lymphs Abs: 2.9 10*3/uL (ref 0.7–4.0)
MCH: 29.3 pg (ref 26.0–34.0)
MCHC: 32.4 g/dL (ref 30.0–36.0)
MCV: 90.5 fL (ref 80.0–100.0)
Monocytes Absolute: 1.2 10*3/uL — ABNORMAL HIGH (ref 0.1–1.0)
Monocytes Relative: 8 %
Neutro Abs: 10.5 10*3/uL — ABNORMAL HIGH (ref 1.7–7.7)
Neutrophils Relative %: 70 %
Platelets: 344 10*3/uL (ref 150–400)
RBC: 4.95 MIL/uL (ref 3.87–5.11)
RDW: 14 % (ref 11.5–15.5)
WBC: 15 10*3/uL — ABNORMAL HIGH (ref 4.0–10.5)
nRBC: 0 % (ref 0.0–0.2)

## 2020-04-29 LAB — BASIC METABOLIC PANEL
Anion gap: 12 (ref 5–15)
BUN: 15 mg/dL (ref 8–23)
CO2: 28 mmol/L (ref 22–32)
Calcium: 9.6 mg/dL (ref 8.9–10.3)
Chloride: 99 mmol/L (ref 98–111)
Creatinine, Ser: 0.75 mg/dL (ref 0.44–1.00)
GFR calc Af Amer: >60 mL/min (ref >60)
GFR calc non Af Amer: >60 mL/min (ref >60)
Glucose, Bld: 104 mg/dL — ABNORMAL HIGH (ref 70–99)
Potassium: 4.7 mmol/L (ref 3.5–5.1)
Sodium: 139 mmol/L (ref 135–145)

## 2020-04-29 NOTE — Telephone Encounter (Addendum)
Pt contacted pre-coronary stent intervention scheduled at [facility] for: Monday May 03, 2020 7:30 AM Verified arrival time and place: [facility]) at: 5:30 AM   No solid food after midnight prior to cath, clear liquids until 5 AM day of procedure.  Hold: Lasix-AM of procedure Spironolactone-AM of procedure  Except hold medications AM meds can be  taken pre-cath with sips of water including: ASA 81 mg Plavix 75 mg  Confirmed patient has responsible adult to drive home post procedure and observe 24 hours after arriving home: yes  You are allowed ONE visitor in the waiting room during your procedure. Both you and your visitor must wear a mask once you enter the hospital.       COVID-19 Pre-Screening Questions:  . In the past 14 days have you had a new cough, shortness of breath, headache, congestion, fever (100 or greater) unexplained body aches, new sore throat, or sudden loss of taste or sense of smell? no . In the past 14  days have you been around anyone with known COVID-19? No . Have you been vaccinated for COVID-19? Yes, see immunization history.                          Reviewed procedure/mask/visitor instructions, COVID-19 questions with patient.

## 2020-04-29 NOTE — Telephone Encounter (Addendum)
04/29/20 WBC 15.0-below copied from 04/29/20 CBC results:   [name], MD  04/29/2020 1:05 PM EDT     Please let Ms. Tretter know that her kidney function and electrolytes are normal. She has a modest elevation in her WBC count. Please reach out to Ms. Groene to see if she has any signs or symptoms of active infection. If so, I suggest we defer her PCI until symptoms have resolved. If she is asymptomatic, a CBC should be repeated when she presents for her cath next week.  ______________  Lab results discussed with patient. Pt reports she generally feels good and denies any signs/symptoms of infection, including fever/chills, cough, URI congestion,  frequent/painful urination, cloudy/bloody urine.  Pt is aware she should notify our office if she develops any signs/symptoms of infection prior to reporting to [facility] for procedure 05/03/20. She is aware CBC will be done when she presents to hospital  for procedure 05/03/20.

## 2020-04-30 LAB — SARS CORONAVIRUS 2 (TAT 6-24 HRS): SARS Coronavirus 2: NEGATIVE

## 2020-05-03 ENCOUNTER — Ambulatory Visit (HOSPITAL_COMMUNITY)
Admission: RE | Admit: 2020-05-03 | Discharge: 2020-05-04 | Disposition: A | Discharge status: Home / Self Care | Payer: Medicare Other | Source: Ambulatory Visit | Attending: Internal Medicine | Admitting: Internal Medicine

## 2020-05-03 ENCOUNTER — Other Ambulatory Visit: Payer: Self-pay

## 2020-05-03 ENCOUNTER — Encounter (HOSPITAL_COMMUNITY)
Admission: RE | Disposition: A | Discharge status: Home / Self Care | Payer: Self-pay | Source: Ambulatory Visit | Attending: Internal Medicine

## 2020-05-03 ENCOUNTER — Ambulatory Visit (HOSPITAL_COMMUNITY): Payer: Medicare Other

## 2020-05-03 DIAGNOSIS — Z9012 Acquired absence of left breast and nipple: Secondary | ICD-10-CM | POA: Diagnosis not present

## 2020-05-03 DIAGNOSIS — Z79899 Other long term (current) drug therapy: Secondary | ICD-10-CM | POA: Diagnosis not present

## 2020-05-03 DIAGNOSIS — I5022 Chronic systolic (congestive) heart failure: Secondary | ICD-10-CM | POA: Diagnosis not present

## 2020-05-03 DIAGNOSIS — Z885 Allergy status to narcotic agent status: Secondary | ICD-10-CM | POA: Insufficient documentation

## 2020-05-03 DIAGNOSIS — D72829 Elevated white blood cell count, unspecified: Secondary | ICD-10-CM

## 2020-05-03 DIAGNOSIS — Z882 Allergy status to sulfonamides status: Secondary | ICD-10-CM | POA: Insufficient documentation

## 2020-05-03 DIAGNOSIS — Z888 Allergy status to other drugs, medicaments and biological substances status: Secondary | ICD-10-CM | POA: Insufficient documentation

## 2020-05-03 DIAGNOSIS — M8580 Other specified disorders of bone density and structure, unspecified site: Secondary | ICD-10-CM | POA: Diagnosis not present

## 2020-05-03 DIAGNOSIS — I2510 Atherosclerotic heart disease of native coronary artery without angina pectoris: Secondary | ICD-10-CM | POA: Diagnosis not present

## 2020-05-03 DIAGNOSIS — I2582 Chronic total occlusion of coronary artery: Secondary | ICD-10-CM | POA: Insufficient documentation

## 2020-05-03 DIAGNOSIS — Z853 Personal history of malignant neoplasm of breast: Secondary | ICD-10-CM | POA: Insufficient documentation

## 2020-05-03 DIAGNOSIS — Z7982 Long term (current) use of aspirin: Secondary | ICD-10-CM | POA: Diagnosis not present

## 2020-05-03 DIAGNOSIS — E785 Hyperlipidemia, unspecified: Secondary | ICD-10-CM | POA: Diagnosis present

## 2020-05-03 DIAGNOSIS — E782 Mixed hyperlipidemia: Secondary | ICD-10-CM | POA: Insufficient documentation

## 2020-05-03 DIAGNOSIS — M1990 Unspecified osteoarthritis, unspecified site: Secondary | ICD-10-CM | POA: Diagnosis not present

## 2020-05-03 DIAGNOSIS — I10 Essential (primary) hypertension: Secondary | ICD-10-CM | POA: Diagnosis present

## 2020-05-03 DIAGNOSIS — I25119 Atherosclerotic heart disease of native coronary artery with unspecified angina pectoris: Secondary | ICD-10-CM | POA: Diagnosis present

## 2020-05-03 DIAGNOSIS — I255 Ischemic cardiomyopathy: Secondary | ICD-10-CM | POA: Diagnosis not present

## 2020-05-03 DIAGNOSIS — J9811 Atelectasis: Secondary | ICD-10-CM | POA: Diagnosis not present

## 2020-05-03 DIAGNOSIS — Z8619 Personal history of other infectious and parasitic diseases: Secondary | ICD-10-CM | POA: Diagnosis not present

## 2020-05-03 DIAGNOSIS — I110 Hypertensive heart disease with heart failure: Secondary | ICD-10-CM | POA: Insufficient documentation

## 2020-05-03 HISTORY — PX: CORONARY ULTRASOUND/IVUS: CATH118244

## 2020-05-03 HISTORY — PX: LEFT HEART CATH: CATH118248

## 2020-05-03 HISTORY — PX: CORONARY STENT INTERVENTION: CATH118234

## 2020-05-03 LAB — URINALYSIS, ROUTINE W REFLEX MICROSCOPIC
Bilirubin Urine: NEGATIVE
Glucose, UA: NEGATIVE mg/dL
Hgb urine dipstick: NEGATIVE
Ketones, ur: NEGATIVE mg/dL
Nitrite: NEGATIVE
Protein, ur: 30 mg/dL — AB
Specific Gravity, Urine: >1.046 — ABNORMAL HIGH (ref 1.005–1.030)
pH: 5 (ref 5.0–8.0)

## 2020-05-03 LAB — CBC
HCT: 42.1 % (ref 36.0–46.0)
Hemoglobin: 13.4 g/dL (ref 12.0–15.0)
MCH: 29.7 pg (ref 26.0–34.0)
MCHC: 31.8 g/dL (ref 30.0–36.0)
MCV: 93.3 fL (ref 80.0–100.0)
Platelets: 305 10*3/uL (ref 150–400)
RBC: 4.51 MIL/uL (ref 3.87–5.11)
RDW: 13.7 % (ref 11.5–15.5)
WBC: 16.8 10*3/uL — ABNORMAL HIGH (ref 4.0–10.5)
nRBC: 0 % (ref 0.0–0.2)

## 2020-05-03 LAB — POCT ACTIVATED CLOTTING TIME: Activated Clotting Time: 345 seconds

## 2020-05-03 SURGERY — CORONARY STENT INTERVENTION
Anesthesia: LOCAL

## 2020-05-03 MED ORDER — HEPARIN (PORCINE) IN NACL 1000-0.9 UT/500ML-% IV SOLN
INTRAVENOUS | Status: DC | PRN
  Administered 2020-05-03: 500 mL

## 2020-05-03 MED ORDER — SODIUM CHLORIDE 0.9 % IV SOLN
INTRAVENOUS | Status: DC | PRN
  Administered 2020-05-03 (×2): 1.75 mg/kg/h via INTRAVENOUS

## 2020-05-03 MED ORDER — ONDANSETRON HCL 4 MG/2ML IJ SOLN: 4.0000 mg | Freq: Four times a day (QID) | INTRAMUSCULAR | Status: DC | PRN

## 2020-05-03 MED ORDER — SODIUM CHLORIDE 0.9 % IV SOLN: 250.0000 mL | INTRAVENOUS | Status: DC | PRN

## 2020-05-03 MED ORDER — LIDOCAINE HCL (PF) 1 % IJ SOLN
INTRAMUSCULAR | Status: DC | PRN
  Administered 2020-05-03: 2 mL

## 2020-05-03 MED ORDER — NITROGLYCERIN 1 MG/10 ML FOR IR/CATH LAB
INTRA_ARTERIAL | Status: AC
  Filled 2020-05-03: qty 10

## 2020-05-03 MED ORDER — CLOPIDOGREL BISULFATE 75 MG PO TABS
300.0000 mg | ORAL_TABLET | ORAL | Status: AC
  Administered 2020-05-03: 225 mg via ORAL
  Filled 2020-05-03: qty 4

## 2020-05-03 MED ORDER — SACUBITRIL-VALSARTAN 49-51 MG PO TABS
1.0000 | ORAL_TABLET | Freq: Two times a day (BID) | ORAL | Status: DC
  Administered 2020-05-03 – 2020-05-04 (×2): 1 via ORAL
  Filled 2020-05-03 (×3): qty 1

## 2020-05-03 MED ORDER — SODIUM CHLORIDE 0.9% FLUSH: 3.0000 mL | INTRAVENOUS | Status: DC | PRN

## 2020-05-03 MED ORDER — SPIRONOLACTONE 25 MG PO TABS
25.0000 mg | ORAL_TABLET | Freq: Every day | ORAL | Status: DC
  Administered 2020-05-04: 25 mg via ORAL
  Filled 2020-05-03: qty 1

## 2020-05-03 MED ORDER — HEPARIN (PORCINE) IN NACL 1000-0.9 UT/500ML-% IV SOLN
INTRAVENOUS | Status: AC
  Filled 2020-05-03: qty 1000

## 2020-05-03 MED ORDER — ENOXAPARIN SODIUM 40 MG/0.4ML ~~LOC~~ SOLN
40.0000 mg | SUBCUTANEOUS | Status: DC
  Filled 2020-05-03: qty 0.4

## 2020-05-03 MED ORDER — SODIUM CHLORIDE 0.9% FLUSH: 3.0000 mL | Freq: Two times a day (BID) | INTRAVENOUS | Status: DC

## 2020-05-03 MED ORDER — FLUOXETINE HCL 20 MG PO CAPS
20.0000 mg | ORAL_CAPSULE | Freq: Every day | ORAL | Status: DC
  Administered 2020-05-04: 20 mg via ORAL
  Filled 2020-05-03: qty 1

## 2020-05-03 MED ORDER — BIVALIRUDIN TRIFLUOROACETATE 250 MG IV SOLR
INTRAVENOUS | Status: AC
  Filled 2020-05-03: qty 250

## 2020-05-03 MED ORDER — MIDAZOLAM HCL 2 MG/2ML IJ SOLN
INTRAMUSCULAR | Status: AC
  Filled 2020-05-03: qty 2

## 2020-05-03 MED ORDER — FUROSEMIDE 40 MG PO TABS
40.0000 mg | ORAL_TABLET | Freq: Every day | ORAL | Status: DC
  Administered 2020-05-04: 40 mg via ORAL
  Filled 2020-05-03: qty 1

## 2020-05-03 MED ORDER — NITROGLYCERIN 1 MG/10 ML FOR IR/CATH LAB
INTRA_ARTERIAL | Status: DC | PRN
  Administered 2020-05-03: 200 ug via INTRACORONARY
  Administered 2020-05-03 (×2): 200 ug

## 2020-05-03 MED ORDER — ASPIRIN 81 MG PO CHEW: 81.0000 mg | CHEWABLE_TABLET | ORAL | Status: DC

## 2020-05-03 MED ORDER — MIDAZOLAM HCL 2 MG/2ML IJ SOLN
INTRAMUSCULAR | Status: DC | PRN
  Administered 2020-05-03: 1 mg via INTRAVENOUS

## 2020-05-03 MED ORDER — VERAPAMIL HCL 2.5 MG/ML IV SOLN
INTRAVENOUS | Status: DC | PRN
  Administered 2020-05-03: 10 mL via INTRA_ARTERIAL

## 2020-05-03 MED ORDER — LABETALOL HCL 5 MG/ML IV SOLN: 10.0000 mg | INTRAVENOUS | Status: DC | PRN

## 2020-05-03 MED ORDER — ASPIRIN EC 81 MG PO TBEC
81.0000 mg | DELAYED_RELEASE_TABLET | Freq: Every day | ORAL | Status: DC
  Administered 2020-05-04: 81 mg via ORAL
  Filled 2020-05-03: qty 1

## 2020-05-03 MED ORDER — LIDOCAINE HCL (PF) 1 % IJ SOLN
INTRAMUSCULAR | Status: AC
  Filled 2020-05-03: qty 30

## 2020-05-03 MED ORDER — BIVALIRUDIN BOLUS VIA INFUSION - CUPID
INTRAVENOUS | Status: DC | PRN
  Administered 2020-05-03: 68.1 mg via INTRAVENOUS

## 2020-05-03 MED ORDER — FENTANYL CITRATE (PF) 100 MCG/2ML IJ SOLN
INTRAMUSCULAR | Status: DC | PRN
  Administered 2020-05-03: 50 ug via INTRAVENOUS
  Administered 2020-05-03: 25 ug via INTRAVENOUS

## 2020-05-03 MED ORDER — CARVEDILOL 6.25 MG PO TABS
9.3750 mg | ORAL_TABLET | Freq: Two times a day (BID) | ORAL | Status: DC
  Administered 2020-05-03 – 2020-05-04 (×2): 9.375 mg via ORAL
  Filled 2020-05-03 (×2): qty 1

## 2020-05-03 MED ORDER — IOHEXOL 350 MG/ML SOLN
INTRAVENOUS | Status: AC
  Filled 2020-05-03: qty 1

## 2020-05-03 MED ORDER — FENTANYL CITRATE (PF) 100 MCG/2ML IJ SOLN
INTRAMUSCULAR | Status: AC
  Filled 2020-05-03: qty 2

## 2020-05-03 MED ORDER — HYDRALAZINE HCL 20 MG/ML IJ SOLN: 10.0000 mg | INTRAMUSCULAR | Status: DC | PRN

## 2020-05-03 MED ORDER — VERAPAMIL HCL 2.5 MG/ML IV SOLN
INTRAVENOUS | Status: AC
  Filled 2020-05-03: qty 2

## 2020-05-03 MED ORDER — SODIUM CHLORIDE 0.9 % IV SOLN: INTRAVENOUS | Status: DC

## 2020-05-03 MED ORDER — ROSUVASTATIN CALCIUM 5 MG PO TABS
5.0000 mg | ORAL_TABLET | Freq: Every day | ORAL | Status: DC
  Administered 2020-05-04: 5 mg via ORAL
  Filled 2020-05-03: qty 1

## 2020-05-03 MED ORDER — CLOPIDOGREL BISULFATE 75 MG PO TABS
75.0000 mg | ORAL_TABLET | Freq: Every day | ORAL | Status: DC
  Administered 2020-05-04: 75 mg via ORAL
  Filled 2020-05-03: qty 1

## 2020-05-03 MED ORDER — ACETAMINOPHEN 325 MG PO TABS: 650.0000 mg | ORAL_TABLET | ORAL | Status: DC | PRN

## 2020-05-03 SURGICAL SUPPLY — 23 items
BAG MDU5 PLUS (MISCELLANEOUS) ×2 IMPLANT
BALLN SAPPHIRE 2.0X15 (BALLOONS) ×2
BALLN SAPPHIRE ~~LOC~~ 2.5X18 (BALLOONS) ×2 IMPLANT
BALLN SAPPHIRE ~~LOC~~ 2.75X18 (BALLOONS) ×2 IMPLANT
BALLOON SAPPHIRE 2.0X15 (BALLOONS) ×1 IMPLANT
CATH INFINITI JR4 5F (CATHETERS) ×2 IMPLANT
CATH LAUNCHER 6FR EBU3.5 (CATHETERS) ×2 IMPLANT
CATH OPTICROSS HD (CATHETERS) ×2 IMPLANT
DEVICE RAD COMP TR BAND LRG (VASCULAR PRODUCTS) ×2 IMPLANT
ELECT DEFIB PAD ADLT CADENCE (PAD) ×2 IMPLANT
GLIDESHEATH SLEND SS 6F .021 (SHEATH) ×2 IMPLANT
GUIDEWIRE INQWIRE 1.5J.035X260 (WIRE) ×1 IMPLANT
INQWIRE 1.5J .035X260CM (WIRE) ×2
KIT ENCORE 26 ADVANTAGE (KITS) ×2 IMPLANT
KIT HEART LEFT (KITS) ×2 IMPLANT
PACK CARDIAC CATHETERIZATION (CUSTOM PROCEDURE TRAY) ×2 IMPLANT
SHEATH PROBE COVER 6X72 (BAG) ×4 IMPLANT
SLED PULL BACK IVUS (MISCELLANEOUS) ×2 IMPLANT
STENT RESOLUTE ONYX 2.25X34 (Permanent Stent) ×2 IMPLANT
STENT RESOLUTE ONYX 2.5X38 (Permanent Stent) ×2 IMPLANT
TRANSDUCER W/STOPCOCK (MISCELLANEOUS) ×2 IMPLANT
TUBING CIL FLEX 10 FLL-RA (TUBING) ×2 IMPLANT
WIRE RUNTHROUGH .014X180CM (WIRE) ×2 IMPLANT

## 2020-05-03 NOTE — Interval H&P Note (Signed)
History and Physical Interval Note:  05/03/2020 7:16 AM  [name] [name]  has presented today for surgery, with the diagnosis of coronary artery disease and chronic systolic heart failure.  The various methods of treatment have been discussed with the patient and family. After consideration of risks, benefits and other options for treatment, the patient has consented to  Procedure(s): CORONARY STENT INTERVENTION (N/A) as a surgical intervention.  The patient's history has been reviewed, patient examined, no change in status, stable for surgery.  I have reviewed the patient's chart and labs.  Questions were answered to the patient's satisfaction.    Cath Lab Visit (complete for each Cath Lab visit)  Clinical Evaluation Leading to the Procedure:   ACS: No.  Non-ACS:    Anginal/Heart Failure Classification: CCS III  Anti-ischemic medical therapy: Minimal Therapy (1 class of medications)  Non-Invasive Test Results: No non-invasive testing performed (LVEF < 35% with 2-vessel CAD by diagnostic cath -> high risk)  Prior CABG: No previous CABG  Durwood Dittus

## 2020-05-03 NOTE — Brief Op Note (Signed)
BRIEF CARDIAC CATHETERIZATION NOTE  05/03/2020  9:28 AM  PATIENT:  [name] [name]  69 y.o. female  PRE-OPERATIVE DIAGNOSIS:  CAD and chronic HFrEF  POST-OPERATIVE DIAGNOSIS:  Same  PROCEDURE:  Procedure(s): CORONARY STENT INTERVENTION (N/A) Intravascular Ultrasound/IVUS (N/A) Left Heart Cath (N/A)  SURGEON:  Surgeon(s) and Role:    * Sacha Radloff, Harrell Gave, MD - Primary  FINDING: 1. Severe 2-vessel CAD with diffuse LAD disease of up to 70-80% that is significant by IVUS and CTO of RCA. 2. Upper normal to mildly elevated LVEDP. 3. Successful IVUS-guided PCI to mid/distal LAD using overlapping Resolute Onyx 2.5 x 38 mm and 2.25 x 34 mm drug-eluting stents with 0% residual stenosis and TIMI-3 flow.  RECOMMENDATIONS: 1. Overnight extended recovery given multivessel CAD and LVEF < 35%. 2. Obtain CXR and UA/urine culture for further evaluation of leukocytosis. 3. DAPT with ASA and clopidogrel for at lest 6 months, ideally longer. 4. Recheck lipid panel in AM and escalate statin therapy, as tolerated, if LDL > 70.  [name], MD [facility]

## 2020-05-04 ENCOUNTER — Other Ambulatory Visit: Payer: Self-pay | Admitting: Physician Assistant

## 2020-05-04 ENCOUNTER — Encounter (HOSPITAL_COMMUNITY): Payer: Self-pay | Admitting: Internal Medicine

## 2020-05-04 DIAGNOSIS — Z882 Allergy status to sulfonamides status: Secondary | ICD-10-CM | POA: Diagnosis not present

## 2020-05-04 DIAGNOSIS — M8580 Other specified disorders of bone density and structure, unspecified site: Secondary | ICD-10-CM | POA: Diagnosis not present

## 2020-05-04 DIAGNOSIS — I25118 Atherosclerotic heart disease of native coronary artery with other forms of angina pectoris: Secondary | ICD-10-CM | POA: Diagnosis not present

## 2020-05-04 DIAGNOSIS — I110 Hypertensive heart disease with heart failure: Secondary | ICD-10-CM | POA: Diagnosis not present

## 2020-05-04 DIAGNOSIS — I2582 Chronic total occlusion of coronary artery: Secondary | ICD-10-CM | POA: Diagnosis not present

## 2020-05-04 DIAGNOSIS — Z7982 Long term (current) use of aspirin: Secondary | ICD-10-CM | POA: Diagnosis not present

## 2020-05-04 DIAGNOSIS — M1990 Unspecified osteoarthritis, unspecified site: Secondary | ICD-10-CM | POA: Diagnosis not present

## 2020-05-04 DIAGNOSIS — E782 Mixed hyperlipidemia: Secondary | ICD-10-CM | POA: Diagnosis not present

## 2020-05-04 DIAGNOSIS — I5022 Chronic systolic (congestive) heart failure: Secondary | ICD-10-CM

## 2020-05-04 DIAGNOSIS — Z853 Personal history of malignant neoplasm of breast: Secondary | ICD-10-CM | POA: Diagnosis not present

## 2020-05-04 DIAGNOSIS — Z79899 Other long term (current) drug therapy: Secondary | ICD-10-CM | POA: Diagnosis not present

## 2020-05-04 DIAGNOSIS — Z9012 Acquired absence of left breast and nipple: Secondary | ICD-10-CM | POA: Diagnosis not present

## 2020-05-04 DIAGNOSIS — I2510 Atherosclerotic heart disease of native coronary artery without angina pectoris: Secondary | ICD-10-CM | POA: Diagnosis not present

## 2020-05-04 DIAGNOSIS — E785 Hyperlipidemia, unspecified: Secondary | ICD-10-CM | POA: Diagnosis not present

## 2020-05-04 LAB — LIPID PANEL
Cholesterol: 100 mg/dL (ref 0–200)
HDL: 39 mg/dL — ABNORMAL LOW (ref >40)
LDL Cholesterol: 48 mg/dL (ref 0–99)
Total CHOL/HDL Ratio: 2.6 RATIO
Triglycerides: 64 mg/dL (ref <150)
VLDL: 13 mg/dL (ref 0–40)

## 2020-05-04 LAB — CBC
HCT: 37.3 % (ref 36.0–46.0)
Hemoglobin: 11.6 g/dL — ABNORMAL LOW (ref 12.0–15.0)
MCH: 28.9 pg (ref 26.0–34.0)
MCHC: 31.1 g/dL (ref 30.0–36.0)
MCV: 92.8 fL (ref 80.0–100.0)
Platelets: 319 10*3/uL (ref 150–400)
RBC: 4.02 MIL/uL (ref 3.87–5.11)
RDW: 13.6 % (ref 11.5–15.5)
WBC: 14.2 10*3/uL — ABNORMAL HIGH (ref 4.0–10.5)
nRBC: 0 % (ref 0.0–0.2)

## 2020-05-04 LAB — BASIC METABOLIC PANEL
Anion gap: 9 (ref 5–15)
BUN: 9 mg/dL (ref 8–23)
CO2: 28 mmol/L (ref 22–32)
Calcium: 8.8 mg/dL — ABNORMAL LOW (ref 8.9–10.3)
Chloride: 101 mmol/L (ref 98–111)
Creatinine, Ser: 0.57 mg/dL (ref 0.44–1.00)
GFR calc Af Amer: >60 mL/min (ref >60)
GFR calc non Af Amer: >60 mL/min (ref >60)
Glucose, Bld: 121 mg/dL — ABNORMAL HIGH (ref 70–99)
Potassium: 3.8 mmol/L (ref 3.5–5.1)
Sodium: 138 mmol/L (ref 135–145)

## 2020-05-04 LAB — URINE CULTURE

## 2020-05-04 MED ORDER — DAPAGLIFLOZIN PROPANEDIOL 10 MG PO TABS
10.0000 mg | ORAL_TABLET | Freq: Every day | ORAL | Status: DC
  Administered 2020-05-04: 10 mg via ORAL
  Filled 2020-05-04: qty 1

## 2020-05-04 MED ORDER — DAPAGLIFLOZIN PROPANEDIOL 10 MG PO TABS
10.0000 mg | ORAL_TABLET | Freq: Every day | ORAL | 3 refills | Status: DC
Start: 2020-05-04 — End: 2021-05-17

## 2020-05-04 MED ORDER — ROSUVASTATIN CALCIUM 5 MG PO TABS
10.0000 mg | ORAL_TABLET | Freq: Every day | ORAL | 3 refills | Status: DC
Start: 2020-05-04 — End: 2020-08-25

## 2020-05-04 MED FILL — FARXIGA 10 MG TABLET: 10 | 30 days supply | Qty: 30 | Fill #0

## 2020-05-04 MED FILL — ROSUVASTATIN CALCIUM 10 MG: 10 | 45 days supply | Qty: 45 | Fill #0

## 2020-05-04 NOTE — Discharge Summary (Addendum)
The patient has been seen in conjunction with Fabian Sharp, PA-C. All aspects of care have been considered and discussed. The patient has been personally interviewed, examined, and all clinical data has been reviewed.  Please note that adjustments in heart failure regimen have been made.  Dapagliflozin 10 mg has been added.  The patient is also on furosemide 40 mg/day and Aldactone 25 mg/day.  We recommend basic metabolic panel in 5 to 7 days with consideration of decreasing the dose of either furosemide or Aldactone by 50% depending upon findings. No ischemic complications or issues overnight.  Aspirin and clopidogrel should be continued for least 6 months. TOC follow-up has been arranged for 7 to 10 days. Right radial access site is unremarkable.    Discharge Summary    Patient ID: [name] [name] MRN: [id number]; DOB: 05/25/1951  Admit date: 05/03/2020 Discharge date: 05/04/2020  Primary Care Provider: Owens Loffler, MD  Primary Cardiologist: [name], MD  Primary Electrophysiologist:  None   Discharge Diagnoses    Principal Problem:   Coronary artery disease involving native coronary artery of native heart with angina pectoris [facility]) Active Problems:   Mixed hyperlipidemia   Essential hypertension   Ischemic cardiomyopathy   Chronic HFrEF (heart failure with reduced ejection fraction) ([place])   Diagnostic Studies/Procedures    Left heart cath 05/03/20: Conclusions: Severe two-vessel coronary artery disease with diffuse LAD disease of up to 70-80% that is significant by IVUS, as well as CTO of RCA. Upper normal LVEDP. Successful IVUS-guided PCI to mid/distal LAD using overlapping Resolute Onyx 2.5 x 38 mm and 2.25 x 34 mm drug-eluting stents with 0% residual stenosis and TIMI-3 flow.   Recommendations: Overnight extended recovery given multivessel CAD and LVEF < 35%. Obtain CXR and UA/urine culture for further evaluation of leukocytosis. Dual antiplatelet therapy  with ASA and clopidogrel for at lest 6 months, ideally longer. Recheck lipid panel in AM and escalate statin therapy, as tolerated, if LDL > 70. _____________   History of Present Illness     [name] [name] is a 69 y.o. female with multi-vessel CAD, HFrEF, HTN, HLD, arthritis, and breast cancer.   Hospitalized March 2021 with exertional dyspnea and noted newly reduced LVEF 25-30% by echo. Treated medically with diuresis and GDMT.  Cardiac catheterization with significant two-vessel CAD with diffuse LAD stenosis of 70-80% in the mid to distal vessel and chronic subtotal occlusion of the mid RCA with left-to-right collaterals.  Left and right heart filling pressures remained moderately elevated in setting of reduced cardiac output.  Medical therapy was recommended as small vessel size and diffuse disease limited revascularization options.  Discharged on atorvastatin, aspirin, carvedilol, fenofibrate, furosemide, Entresto, spironolactone.   Follow-up 12/26/2019 she noted myalgias and her fenofibrate and statin were discontinued.  She was transitioned from losartan to Entresto approximately 12/09/2019. She did have mild cough, but was started on antihistamine for allergies and cough resolved. 01/23/20 she was started on Rosuvastatin and has tolerated without recurrent myalgias.    Repeat echo 03/01/20 with continued LVEF 66-44%, grade 2 diastolic dysfunction.  When seen in clinic 03/10/20 cardiac MRI was ordered for viability study. Her Coreg was up-titrated for further optimization of GDMT. Cardiac MRI performed 04/13/20 and is awaiting read.   Presents today with her niece. Reports feeling overall well. Reports no shortness of breath. Reports only mild dyspnea on exertion with more than usual activity. Reports no chest pain, pressure, or tightness. No edema, orthopnea, PND. Reports no palpitations.  Monitors her weights carefully at home and reports they have been stable.  Endorses eating a low-sodium  diet.  Endorses fluid intake of less than 2 L.  She has 3 family members that live with her and help her adhere to her dietary goals.  She has been trying to walk for exercise.     Hospital Course     Consultants: none  CAD Left heart cath showed severe 2v disease. LAD with 70-80% stenosis that was significant by IVUS was treated with overlapping DES. CTO of RCA will be treated medically for now. L-R collaterals noted. She tolerated the procedure well and was started on ASA and plavix.    Chronic systolic heart failure Echo with EF 25-30%. Continue entresto, BB, spironolactone, and PO lasix. She reported some SOB this morning, but has not had morning CHF medications. Will walk after AM medications to make sure she is still not short of breath. Will also add 10 mg farxiga.   Plan for BMP in 1 week. I will continue 40 mg lasix for now, but this may need to be reduced or D/C'ed at follow up. I discussed    Hyperlipidemia with LDL goal < 70 05/04/2020: Cholesterol 100; HDL 39; LDL Cholesterol 48; Triglycerides 64; VLDL 13 She is at goal on 5 mg Crestor. I spoke with her about titrating to 10 mg and she is agreeable. Would ideally like to get her to at least 20 mg for stabilization given her disease.    Leukocytosis WBC 16.8 -->   14.2 CXR did not show evidence of infection. UA with rare bacteria. She is not having urinary symptoms. Afebrile. I will not treat at this time.    Hypertension Medications as above. Pressure is well-controlled.    Pt seen and examined by Dr. Tamala Julian and deemed stable for discharge. Follow up has been arranged.    Did the patient have an acute coronary syndrome (MI, NSTEMI, STEMI, etc) this admission?:  No                               Did the patient have a percutaneous coronary intervention (stent / angioplasty)?:  Yes.     Cath/PCI Registry Performance & Quality Measures: Aspirin prescribed? - Yes ADP Receptor Inhibitor (Plavix/Clopidogrel,  Brilinta/Ticagrelor or Effient/Prasugrel) prescribed (includes medically managed patients)? - Yes High Intensity Statin (Lipitor 40-80mg  or Crestor 20-40mg ) prescribed? - No - intolerance For EF <40%, was ACEI/ARB prescribed? - Yes For EF <40%, Aldosterone Antagonist (Spironolactone or Eplerenone) prescribed? - Yes Cardiac Rehab Phase II ordered? - Yes   _____________  Discharge Vitals Blood pressure (!) 106/54, pulse 78, temperature 98.1 F (36.7 C), temperature source Oral, resp. rate 16, height 5\' 4"  (1.626 m), weight 90.1 kg, SpO2 98 %.  Filed Weights   05/03/20 0541 05/04/20 0237  Weight: 90.8 kg 90.1 kg    Labs & Radiologic Studies    CBC Recent Labs    05/03/20 0544 05/04/20 0812  WBC 16.8* 14.2*  HGB 13.4 11.6*  HCT 42.1 37.3  MCV 93.3 92.8  PLT 305 976   Basic Metabolic Panel Recent Labs    05/04/20 0812  NA 138  K 3.8  CL 101  CO2 28  GLUCOSE 121*  BUN 9  CREATININE 0.57  CALCIUM 8.8*   Liver Function Tests No results for input(s): AST, ALT, ALKPHOS, BILITOT, PROT, ALBUMIN in the last 72 hours. No results for input(s): LIPASE, AMYLASE  in the last 72 hours. High Sensitivity Troponin:   No results for input(s): TROPONINIHS in the last 720 hours.  BNP Invalid input(s): POCBNP D-Dimer No results for input(s): DDIMER in the last 72 hours. Hemoglobin A1C No results for input(s): HGBA1C in the last 72 hours. Fasting Lipid Panel Recent Labs    05/04/20 0812  CHOL 100  HDL 39*  LDLCALC 48  TRIG 64  CHOLHDL 2.6   Thyroid Function Tests No results for input(s): TSH, T4TOTAL, T3FREE, THYROIDAB in the last 72 hours.  Invalid input(s): FREET3 _____________  DG Chest 2 View  Result Date: 05/03/2020 CLINICAL DATA:  Leukocytosis EXAM: CHEST - 2 VIEW COMPARISON:  11/28/2019 Correlation: CT angio chest 10/05/2016 FINDINGS: Enlargement of cardiac silhouette with coronary stents. Mediastinal contours and pulmonary vascularity normal. Mild atelectasis at lung  bases. Remaining lungs clear. Significantly improved aeration since previous exam. No pleural effusion or pneumothorax. Chronic height losses of several thoracic vertebra appear unchanged since a prior CT. IMPRESSION: Enlargement of cardiac silhouette with minimal bibasilar atelectasis. Electronically Signed   By: Lavonia Dana M.D.   On: 05/03/2020 15:55   CARDIAC CATHETERIZATION  Result Date: 05/03/2020 Conclusions: 1. Severe two-vessel coronary artery disease with diffuse LAD disease of up to 70-80% that is significant by IVUS, as well as CTO of RCA. 2. Upper normal LVEDP. 3. Successful IVUS-guided PCI to mid/distal LAD using overlapping Resolute Onyx 2.5 x 38 mm and 2.25 x 34 mm drug-eluting stents with 0% residual stenosis and TIMI-3 flow.   Recommendations: 1. Overnight extended recovery given multivessel CAD and LVEF < 35%. 2. Obtain CXR and UA/urine culture for further evaluation of leukocytosis. 3. Dual antiplatelet therapy with ASA and clopidogrel for at lest 6 months, ideally longer. 4. Recheck lipid panel in AM and escalate statin therapy, as tolerated, if LDL > 70. [name], MD [facility]   MR Card Morphology Wo/W Cm  Result Date: 04/17/2020 CLINICAL DATA:  Ischemic cardiomyopathy, assess viability EXAM: CARDIAC MRI TECHNIQUE: The patient was scanned on a 1.5 Tesla GE magnet. A dedicated cardiac coil was used. Functional imaging was done using Fiesta sequences. 2,3, and 4 chamber views were done to assess for RWMA's. Modified Simpson's rule using a short axis stack was used to calculate an ejection fraction on a dedicated work Conservation officer, nature. The patient received 56mL GADAVIST GADOBUTROL 1 MMOL/ML IV SOLN. After 10 minutes inversion recovery sequences were used to assess for infiltration and scar tissue. FINDINGS: LEFT VENTRICLE: Severely enlarged LV chamber size Normal left ventricular wall thickness. Severely reduced left ventricular systolic function. LVEF = 33% Global  hypokinesis with akinesis of the inferior wall from base to apex and inferolateral wall from base to mid ventricle. Delayed myocardial enhancement is seen. At the base, there is delayed myocardial enhancement of the inferior wall in the subendocardium and mid-myocardium. It appears >50% of the myocardial thickness. Inferolateral wall has delayed myocardial enhancement in the subendocardium, and appears <50% of the myocardial thickness. At the mid ventricle, there is delayed myocardial enhancement in the inferior and inferolateral walls in the subendocardium and mid myocardium, and appears >50% of the myocardial thickness. At the apex, there is delayed myocardial enhancement in the inferior wall which appears transmural. These findings in combination suggest no viability of the RCA territory. Normal T1 myocardial nulling kinetics suggest against a diagnosis of cardiac amyloidosis. ECV = 33% which may be consistent with dilated cardiomyopathy or hypertensive heart disease. RIGHT VENTRICLE: Normal right ventricular chamber size. Normal  right ventricular wall thickness. Normal right ventricular systolic function. RVEF = 67% There are no regional wall motion abnormalities. No post contrast delayed myocardial enhancement. ATRIA: Mildly dilated left atrial chamber size. Normal right atrial size. VALVES: Tricuspid aortic valve. PERICARDIUM: Normal pericardium.  No pericardial effusion. OTHER: No significant extracardiac findings. No intramyocardial thrombus or mass noted. MEASUREMENTS: Left ventricle: LV Female LV EF: 33% (normal 56-78%) Absolute volumes: LV EDV: 257mL (Normal 52-141 mL) LV ESV: 143mL (Normal 13-51 mL) LV SV: 15mL (Normal 33-97 mL) CO: 6.7L/min (Normal 2.7-6.0 L/min) Indexed volumes: LV EDV: 15mL/sq-m (Normal 41-81 mL/sq-m) LV ESV: 64mL/sq-m (Normal 12-21 mL/sq-m) LV SV: 46mL/sq-m (Normal 26-56 mL/sq-m) CI: 3.33L/min/sq-m (Normal 1.8-3.8 L/min/sq-m) Right ventricle: RV female RV EF: 67% (normal 47-80%)  Absolute volumes: RV EDV: 116 mL (Normal 58-154 mL) RV ESV: 38 mL (Normal 12-68 mL) RV SV: 79 mL (Normal 35-98 mL) CO: 5.6 L/min (Normal 2.7-6 L/min) Indexed volumes: RV EDV: 57 ML/sq-m (Normal 48-87 mL/sq-m) RV ESV: 19 mL/sq-m (Normal 11-28 mL/sq-m) RV SV: 39 mL/sq-m (Normal 27-57 mL/sq-m) CI: 2.78 L/min/sq-m (Normal 1.8-3.8 L/min/sq-m) IMPRESSION: 1. Severe left ventricular enlargement with severely reduced LV function, LVEF 33%. Akinesis of inferior and inferolateral walls. 2. Post-contrast delayed myocardial enhancement in the inferior and inferolateral walls, with greater than 50% of the myocardial thickness involved suggesting no viability in the RCA territory. 3.  Normal right ventricular size and systolic function. Electronically Signed   By: Cherlynn Kaiser   On: 04/17/2020 16:59   Disposition   Pt is being discharged home today in good condition.  Follow-up Plans & Appointments     Follow-up Information     [facility] .   Specialty: Radiology Contact information: [address] [id number] [place] [phone]        Loel Dubonnet, NP Follow up on 05/14/2020.   Specialty: Cardiology Why: 9:00 AM for [facility] Contact information: [address] [address] [phone]         [facility] Follow up on 05/11/2020.   Specialty: Cardiology Why: Please come for labs - does not need to be fasting. Call office to see if appt is needed as outpatient rules are changing with COVID. Contact information: [place] [phone]               Discharge Instructions     Amb Referral to Cardiac Rehabilitation   Complete by: As directed    Diagnosis: Coronary Stents   After initial evaluation and assessments completed: Virtual Based Care may be provided alone or in conjunction with Phase 2 Cardiac  Rehab based on patient barriers.: Yes   Diet - low sodium heart healthy   Complete by: As directed    Discharge instructions   Complete by: As directed    No driving for 2 days. No lifting over 5 lbs for 1 week. No sexual activity for 1 week. Keep procedure site clean & dry. If you notice increased pain, swelling, bleeding or pus, call/return!  You may shower, but no soaking baths/hot tubs/pools for 1 week.   Increase activity slowly   Complete by: As directed        Discharge Medications   Allergies as of 05/04/2020       Reactions   Sulfur Rash   Ace Inhibitors Cough   Codeine Other (See Comments)   Reation:  Hallucinations  Morphine Other (See Comments)   Reaction:  Hallucinations    Sulfonamide Derivatives Rash        Medication List     STOP taking these medications    meloxicam 15 MG tablet Commonly known as: MOBIC       TAKE these medications    aspirin EC 81 MG tablet Take 81 mg by mouth daily.   Calcium 600+D 600-400 MG-UNIT tablet Generic drug: Calcium Carbonate-Vitamin D Take 1 tablet by mouth 2 (two) times daily.   carvedilol 6.25 MG tablet Commonly known as: COREG Take 1.5 tablets (9.375 mg total) by mouth 2 (two) times daily with a meal.   cetirizine 10 MG tablet Commonly known as: ZYRTEC Take 10 mg by mouth at bedtime.   clopidogrel 75 MG tablet Commonly known as: PLAVIX Take 1 tablet (75 mg total) by mouth daily.   dapagliflozin propanediol 10 MG Tabs tablet Commonly known as: FARXIGA Take 1 tablet (10 mg total) by mouth daily.   FLUoxetine 20 MG capsule Commonly known as: PROZAC Take 1 capsule by mouth once daily   fluticasone 50 MCG/ACT nasal spray Commonly known as: FLONASE Place 1-2 sprays into both nostrils daily as needed for rhinitis.   furosemide 40 MG tablet Commonly known as: Lasix Take 1 tablet (40 mg total) by mouth daily.   GRAPE SEED EXTRACT PO Take 1 capsule by mouth 2 (two) times daily.   Krill Oil 1000 MG  Caps Take 1,000 mg by mouth at bedtime.   meclizine 25 MG tablet Commonly known as: ANTIVERT Take 0.5-1 tablets (12.5-25 mg total) by mouth 3 (three) times daily as needed for dizziness (sedation caution).   multivitamin with minerals Tabs tablet Take 1 tablet by mouth daily.   nystatin cream Commonly known as: MYCOSTATIN Apply 1 application topically 2 (two) times daily.   Osteo Bi-Flex Adv Joint Shield Tabs Take 1 tablet by mouth 2 (two) times daily.   PreserVision AREDS 2+Multi Vit Caps Take 1 capsule by mouth daily.   rosuvastatin 5 MG tablet Commonly known as: CRESTOR Take 2 tablets (10 mg total) by mouth daily. What changed: how much to take   sacubitril-valsartan 49-51 MG Commonly known as: ENTRESTO Take 1 tablet by mouth 2 (two) times daily.   spironolactone 25 MG tablet Commonly known as: ALDACTONE Take 1 tablet (25 mg total) by mouth daily.           Outstanding Labs/Studies   Follow up continued need for lasix.  Duration of Discharge Encounter   Greater than 30 minutes including physician time.  Signed, [place], PA 05/04/2020, 12:05 PM

## 2020-05-04 NOTE — Discharge Instructions (Signed)
Radial Site Care  This sheet gives you information about how to care for yourself after your procedure. Your health care provider may also give you more specific instructions. If you have problems or questions, contact your health care provider. What can I expect after the procedure? After the procedure, it is common to have:  Bruising and tenderness at the catheter insertion area. Follow these instructions at home: Medicines  Take over-the-counter and prescription medicines only as told by your health care provider. Insertion site care  Follow instructions from your health care provider about how to take care of your insertion site. Make sure you: ? Wash your hands with soap and water before you change your bandage (dressing). If soap and water are not available, use hand sanitizer. ? Change your dressing as told by your health care provider. ? Leave stitches (sutures), skin glue, or adhesive strips in place. These skin closures may need to stay in place for 2 weeks or longer. If adhesive strip edges start to loosen and curl up, you may trim the loose edges. Do not remove adhesive strips completely unless your health care provider tells you to do that.  Check your insertion site every day for signs of infection. Check for: ? Redness, swelling, or pain. ? Fluid or blood. ? Pus or a bad smell. ? Warmth.  Do not take baths, swim, or use a hot tub until your health care provider approves.  You may shower 24-48 hours after the procedure, or as directed by your health care provider. ? Remove the dressing and gently wash the site with plain soap and water. ? Pat the area dry with a clean towel. ? Do not rub the site. That could cause bleeding.  Do not apply powder or lotion to the site. Activity   For 24 hours after the procedure, or as directed by your health care provider: ? Do not flex or bend the affected arm. ? Do not push or pull heavy objects with the affected arm. ? Do not  drive yourself home from the hospital or clinic. You may drive 24 hours after the procedure unless your health care provider tells you not to. ? Do not operate machinery or power tools.  Do not lift anything that is heavier than 10 lb (4.5 kg), or the limit that you are told, until your health care provider says that it is safe.  Ask your health care provider when it is okay to: ? Return to work or school. ? Resume usual physical activities or sports. ? Resume sexual activity. General instructions  If the catheter site starts to bleed, raise your arm and put firm pressure on the site. If the bleeding does not stop, get help right away. This is a medical emergency.  If you went home on the same day as your procedure, a responsible adult should be with you for the first 24 hours after you arrive home.  Keep all follow-up visits as told by your health care provider. This is important. Contact a health care provider if:  You have a fever.  You have redness, swelling, or yellow drainage around your insertion site. Get help right away if:  You have unusual pain at the radial site.  The catheter insertion area swells very fast.  The insertion area is bleeding, and the bleeding does not stop when you hold steady pressure on the area.  Your arm or hand becomes pale, cool, tingly, or numb. These symptoms may represent a serious problem   that is an emergency. Do not wait to see if the symptoms will go away. Get medical help right away. Call your local emergency services (911 in the [place] Do not drive yourself to the hospital. Summary  After the procedure, it is common to have bruising and tenderness at the site.  Follow instructions from your health care provider about how to take care of your radial site wound. Check the wound every day for signs of infection.  Do not lift anything that is heavier than 10 lb (4.5 kg), or the limit that you are told, until your health care provider says  that it is safe. This information is not intended to replace advice given to you by your health care provider. Make sure you discuss any questions you have with your health care provider. Document Revised: 10/03/2017 Document Reviewed: 10/03/2017 Elsevier Patient Education  2020 [employer]  

## 2020-05-04 NOTE — Progress Notes (Signed)
Progress Note  Patient Name: [name] [name] Date of Encounter: 05/04/2020  [facility] Cardiologist: [name], MD   Subjective   The patient has ischemic cardiomyopathy with low EF, approximately 35%.  Underwent complex stenting of the LAD yesterday.  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  Somewhat short of breath this morning.  Inpatient Medications    Scheduled Meds: . aspirin EC  81 mg Oral Daily  . carvedilol  9.375 mg Oral BID WC  . clopidogrel  75 mg Oral Daily  . enoxaparin (LOVENOX) injection  40 mg Subcutaneous Q24H  . FLUoxetine  20 mg Oral Daily  . furosemide  40 mg Oral Daily  . rosuvastatin  5 mg Oral Daily  . sacubitril-valsartan  1 tablet Oral BID  . sodium chloride flush  3 mL Intravenous Q12H  . spironolactone  25 mg Oral Daily   Continuous Infusions: . sodium chloride     PRN Meds: sodium chloride, acetaminophen, ondansetron (ZOFRAN) IV, sodium chloride flush   Vital Signs    Vitals:   05/03/20 1948 05/03/20 2344 05/04/20 0237 05/04/20 0817  BP: 128/66 130/74 (!) 106/54   Pulse: 88 88 78 78  Resp: 18 18 18 16   Temp: 98.6 F (37 C) 98.6 F (37 C) 98 F (36.7 C) 98.1 F (36.7 C)  TempSrc: Oral Oral Oral Oral  SpO2: 98% 98% 98% 98%  Weight:   90.1 kg   Height:        Intake/Output Summary (Last 24 hours) at 05/04/2020 1121 Last data filed at 05/03/2020 2000 Gross per 24 hour  Intake 281.19 ml  Output 1 ml  Net 280.19 ml   Last 3 Weights 05/04/2020 05/03/2020 04/16/2020  Weight (lbs) 198 lb 10.2 oz 200 lb 3.2 oz 201 lb 6 oz  Weight (kg) 90.1 kg 90.81 kg 91.343 kg      Telemetry    Normal sinus rhythm- Personally Reviewed  ECG    EKG performed on 05/04/2020 demonstrates, prominent voltage consistent with LVH, nonspecific ST-T abnormality.- Personally Reviewed  Physical Exam  Obese.  Not dyspneic. GEN: No acute distress.   Neck: No JVD Cardiac: RRR, no murmurs, rubs, or gallops.  The right radial  cath site is unremarkable. Respiratory: Clear to auscultation bilaterally. GI: Soft, nontender, non-distended  MS: No edema; No deformity. Neuro:  Nonfocal  Psych: Normal affect   Labs    High Sensitivity Troponin:  No results for input(s): TROPONINIHS in the last 720 hours.    Chemistry Recent Labs  Lab 04/29/20 1202 05/04/20 0812  NA 139 138  K 4.7 3.8  CL 99 101  CO2 28 28  GLUCOSE 104* 121*  BUN 15 9  CREATININE 0.75 0.57  CALCIUM 9.6 8.8*  GFRNONAA >60 >60  GFRAA >60 >60  ANIONGAP 12 9     Hematology Recent Labs  Lab 04/29/20 1202 05/03/20 0544 05/04/20 0812  WBC 15.0* 16.8* 14.2*  RBC 4.95 4.51 4.02  HGB 14.5 13.4 11.6*  HCT 44.8 42.1 37.3  MCV 90.5 93.3 92.8  MCH 29.3 29.7 28.9  MCHC 32.4 31.8 31.1  RDW 14.0 13.7 13.6  PLT 344 305 319    BNPNo results for input(s): BNP, PROBNP in the last 168 hours.   DDimer No results for input(s): DDIMER in the last 168 hours.   Radiology    DG Chest 2 View  Result Date: 05/03/2020 CLINICAL DATA:  Leukocytosis EXAM: CHEST - 2 VIEW COMPARISON:  11/28/2019 Correlation:  CT angio chest 10/05/2016 FINDINGS: Enlargement of cardiac silhouette with coronary stents. Mediastinal contours and pulmonary vascularity normal. Mild atelectasis at lung bases. Remaining lungs clear. Significantly improved aeration since previous exam. No pleural effusion or pneumothorax. Chronic height losses of several thoracic vertebra appear unchanged since a prior CT. IMPRESSION: Enlargement of cardiac silhouette with minimal bibasilar atelectasis. Electronically Signed   By: Lavonia Dana M.D.   On: 05/03/2020 15:55   CARDIAC CATHETERIZATION  Result Date: 05/03/2020 Conclusions: 1. Severe two-vessel coronary artery disease with diffuse LAD disease of up to 70-80% that is significant by IVUS, as well as CTO of RCA. 2. Upper normal LVEDP. 3. Successful IVUS-guided PCI to mid/distal LAD using overlapping Resolute Onyx 2.5 x 38 mm and 2.25 x 34 mm  drug-eluting stents with 0% residual stenosis and TIMI-3 flow.  Recommendations: 1. Overnight extended recovery given multivessel CAD and LVEF < 35%. 2. Obtain CXR and UA/urine culture for further evaluation of leukocytosis. 3. Dual antiplatelet therapy with ASA and clopidogrel for at lest 6 months, ideally longer. 4. Recheck lipid panel in AM and escalate statin therapy, as tolerated, if LDL > 70. [name], MD [facility]    Cardiac Studies   PCI results from 05/03/2020: Diagnostic Dominance: Right  Intervention   ECHOCARDIOGRAM June 2021: LVEF 25 to 30%.  Patient Profile     69 y.o. female with chronic systolic heart failure (ischemic cardiomyopathy), CAD with recent LAD stent and medical therapy of her right coronary, essential hypertension, hyperlipidemia, and obesity.  Assessment & Plan    1. Presumed ischemic cardiomyopathy with chronic systolic heart failure: Continue Entresto, Aldactone, and beta-blocker.  SGLT2 therapy is being initiated.  May be able to reduce loop diuretic dose at follow-up in 1 week with basic metabolic panel that will accompany the office visit. 2. Coronary atherosclerosis with stenting of the LAD: Aspirin and Plavix for minimum of 6 months and consider long-term Plavix monotherapy thereafter. 3. Hyperlipidemia, LDL target less than 70: Continue statin therapy.   Plan discharge today with early follow-up, basic metabolic panel, and consideration of adjustment in diuretic intensity after adding SGLT2.  For questions or updates, please contact [place] Please consult [email or web address] for contact info under        Signed, Sinclair Grooms, MD  05/04/2020, 11:21 AM

## 2020-05-04 NOTE — Progress Notes (Signed)
CARDIAC REHAB PHASE I   PRE:  Rate/Rhythm: 76 SR  BP:  Supine: 125/61  Sitting:   Standing:    SaO2: 98%RA  MODE:  Ambulation: 300 ft   POST:  Rate/Rhythm: 94 SR PVCs/PACs  BP:  Supine: 127/68  Sitting:   Standing:    SaO2: 93%RA 0800-0852 Pt walked 300 ft on RA with hand held asst with slow steady gait. DOE. Sats good on RA but pt limited in walking by SOB. To bed after walk. Education completed and pt voiced understanding. She had received CHF ed in the past and has booklet at home. Knows when to call MD with daily weights,tries to adhere to 2000 mg sodium restriction and I reinforced 2L FR. Gave low sodium and heart healthy diets. Reviewed importance of plavix with stents, NTG use, modified walking for ex, and CRP 2. Referred to [facility] program.    Graylon Good, RN BSN  05/04/2020 8:48 AM

## 2020-05-10 ENCOUNTER — Telehealth: Payer: Self-pay | Admitting: Family

## 2020-05-10 DIAGNOSIS — I5022 Chronic systolic (congestive) heart failure: Secondary | ICD-10-CM

## 2020-05-10 DIAGNOSIS — I25119 Atherosclerotic heart disease of native coronary artery with unspecified angina pectoris: Secondary | ICD-10-CM

## 2020-05-10 NOTE — Telephone Encounter (Signed)
On AVS from discharge from hospital, shows patient needs BMET on 05/11/20. Patient's follow up is on 05/14/20 in the office. Routing to Laurann Montana, NP to see if ok to wait. Perhaps patient could get at [facility] on her way down prior to appointment if needed.

## 2020-05-10 NOTE — Telephone Encounter (Signed)
Patient agreeable to [facility] to coming down for her appointment in our office on 05/14/20. BMP order had to be reentered.

## 2020-05-10 NOTE — Telephone Encounter (Signed)
Patient wants to know if labs can be done in office visit 9/3 or does she need to do before appt   Please advise

## 2020-05-10 NOTE — Telephone Encounter (Signed)
She was started on Dapagliflozin, Lasix, and Spironolactone when seen in hospital so that is why repeat labs were ordered. Okay to have done 05/14/20. If we could have her get them done in the [facility]) prior to her appointment that would be great! That way we can address results Friday rather than waiting the holiday weekend.  Best, Loel Dubonnet, NP

## 2020-05-14 ENCOUNTER — Other Ambulatory Visit: Payer: Self-pay

## 2020-05-14 ENCOUNTER — Ambulatory Visit (INDEPENDENT_AMBULATORY_CARE_PROVIDER_SITE_OTHER): Payer: Medicare Other | Admitting: Family

## 2020-05-14 ENCOUNTER — Encounter: Payer: Self-pay | Admitting: Family

## 2020-05-14 ENCOUNTER — Other Ambulatory Visit
Admission: RE | Admit: 2020-05-14 | Discharge: 2020-05-14 | Disposition: A | Discharge status: Home / Self Care | Payer: Medicare Other | Attending: Family | Admitting: Family

## 2020-05-14 VITALS — BP 120/66 | HR 79 | Ht 64.0 in | Wt 198.0 lb

## 2020-05-14 DIAGNOSIS — E785 Hyperlipidemia, unspecified: Secondary | ICD-10-CM | POA: Diagnosis not present

## 2020-05-14 DIAGNOSIS — I5022 Chronic systolic (congestive) heart failure: Secondary | ICD-10-CM

## 2020-05-14 DIAGNOSIS — I255 Ischemic cardiomyopathy: Secondary | ICD-10-CM

## 2020-05-14 DIAGNOSIS — I25119 Atherosclerotic heart disease of native coronary artery with unspecified angina pectoris: Secondary | ICD-10-CM | POA: Insufficient documentation

## 2020-05-14 LAB — BASIC METABOLIC PANEL
Anion gap: 12 (ref 5–15)
BUN: 12 mg/dL (ref 8–23)
CO2: 25 mmol/L (ref 22–32)
Calcium: 9.6 mg/dL (ref 8.9–10.3)
Chloride: 100 mmol/L (ref 98–111)
Creatinine, Ser: 0.8 mg/dL (ref 0.44–1.00)
GFR calc Af Amer: >60 mL/min (ref >60)
GFR calc non Af Amer: >60 mL/min (ref >60)
Glucose, Bld: 118 mg/dL — ABNORMAL HIGH (ref 70–99)
Potassium: 4.8 mmol/L (ref 3.5–5.1)
Sodium: 137 mmol/L (ref 135–145)

## 2020-05-14 NOTE — Patient Instructions (Signed)
Medication Instructions:  No medication changes today.  *If you need a refill on your cardiac medications before your next appointment, please call your pharmacy*  Lab Work: We will call you about your lab work.  Testing/Procedures: Your EKG today was stable compared to previous.  Follow-Up: At [facility], you and your health needs are our priority.  As part of our continuing mission to provide you with exceptional heart care, we have created designated Provider Care Teams.  These Care Teams include your primary Cardiologist (physician) and Advanced Practice Providers (APPs -  Physician Assistants and Nurse Practitioners) who all work together to provide you with the care you need, when you need it.  We recommend signing up for the patient portal called "MyChart".  Sign up information is provided on this After Visit Summary.  MyChart is used to connect with patients for Virtual Visits (Telemedicine).  Patients are able to view lab/test results, encounter notes, upcoming appointments, etc.  Non-urgent messages can be sent to your provider as well.   To learn more about what you can do with MyChart, go to [email or web address].    Your next appointment:   2 month(s)  The format for your next appointment:   In Person  Provider:    You may see [name], MD or one of the following Advanced Practice Providers on your designated Care Team:    Murray Hodgkins, NP  Christell Faith, PA-C  Laurann Montana, NP  Marrianne Mood, PA-C  Other Instructions   You have been referred to cardiac rehab. This is a combination program including monitored exercise, dietary education, and support group. We strongly recommend participating in the program. Expect a phone call from them in a few weeks. If you do not hear from them, the phone number for cardiac rehab at [facility] is [phone].

## 2020-05-14 NOTE — Progress Notes (Signed)
Office Visit    Patient Name: ROSHANDA BALAZS Date of Encounter: 05/14/2020  Primary Care Provider:  Owens Loffler, MD Primary Cardiologist:  [name], MD Electrophysiologist:  None   Chief Complaint    TIFINI REEDER is a 69 y.o. female with a hx of multivessel CAD, HFrEF, HTN, HLD, arthritis, breast cancer presents today for follow-up after cardiac catheterization  Past Medical History    Past Medical History:  Diagnosis Date  . Allergic rhinitis   . Avulsion fracture of distal fibula 3/10   and sprain; Left  . Breast cancer [facility]) 2004   Left Breast Cancer  . CHF (congestive heart failure) ([place])   . Coronary artery disease    a. 10/2012 ETT: nl; b. 12/2012 Cath: LM nl, LAD min irregs, D1 min irregs, LCX 30p, RCA 26m, RPDA 40p, EF 50%.  . Diastolic dysfunction    a. 12/2012 Echo: EF 50-55%. No rwma. Gr1 DD. Nl RV fxn. Nl PASP.  [name] Kitchen History of hepatitis B    ?--tested + by [employer]  . HLD (hyperlipidemia)   . HTN (hypertension)   . Osteoarthritis   . Osteopenia    Past Surgical History:  Procedure Laterality Date  . ABDOMINAL HYSTERECTOMY  1985   endometriosis; anatomy  . BREAST SURGERY  10/2002  . CARDIAC CATHETERIZATION  12/19/12   [facility]; EF 50%  . COLONOSCOPY  8/09   Hyperplastic polyp; repeat in 10 years  . CORONARY STENT INTERVENTION N/A 05/03/2020   Procedure: CORONARY STENT INTERVENTION;  Surgeon: [name], MD;  Location: [place] [facility];  Service: Cardiovascular;  Laterality: N/A;  . HIP FRACTURE SURGERY  06/14/2011   ball replaced (Dr. Earvin Hansen)  . INTRAVASCULAR ULTRASOUND/IVUS N/A 05/03/2020   Procedure: Intravascular Ultrasound/IVUS;  Surgeon: [name], MD;  Location: [place] [facility];  Service: Cardiovascular;  Laterality: N/A;  . LEFT HEART CATH N/A 05/03/2020   Procedure: Left Heart Cath;  Surgeon: [name], MD;  Location: [place] CV [facility];  Service: Cardiovascular;  Laterality: N/A;  . MASTECTOMY Left 2004  .  RIGHT/LEFT HEART CATH AND CORONARY ANGIOGRAPHY N/A 12/02/2019   Procedure: RIGHT/LEFT HEART CATH AND CORONARY ANGIOGRAPHY;  Surgeon: [name], MD;  Location: [place] [facility];  Service: Cardiovascular;  Laterality: N/A;  . TONSILLECTOMY  1958    Allergies  Allergies  Allergen Reactions  . Sulfur Rash  . Ace Inhibitors Cough  . Codeine Other (See Comments)    Reation:  Hallucinations   . Morphine Other (See Comments)    Reaction:  Hallucinations   . Sulfonamide Derivatives Rash    History of Present Illness    RIHANA KIDDY is a 69 y.o. female with a hx of multivessel CAD, HFrEF, HTN, HLD, arthritis, breast cancer last seen for cardiac catheterization and stent 05/03/2020.  Hospitalized March 2021 with exertional dyspnea and noted newly reduced LVEF 25-30% by echo. Treated medically with diuresis and GDMT.  Cardiac catheterization with significant two-vessel CAD with diffuse LAD stenosis of 70-80% in the mid to distal vessel and chronic subtotal occlusion of the mid RCA with left-to-right collaterals.  Left and right heart filling pressures remained moderately elevated in setting of reduced cardiac output.  Medical therapy was recommended as small vessel size and diffuse disease limited revascularization options.  Discharged on atorvastatin, aspirin, carvedilol, fenofibrate, furosemide, Entresto, spironolactone.  Follow-up 12/26/2019 she noted myalgias and her fenofibrate and statin were discontinued.  She was transitioned from losartan to Entresto approximately 12/09/2019. She  did have mild cough, but was started on antihistamine for allergies and cough resolved. 01/23/20 she was started on Rosuvastatin and has tolerated without recurrent myalgias.   Repeat echo 03/01/20 with continued LVEF 16-10%, grade 2 diastolic dysfunction.  When seen in clinic 03/10/20 cardiac MRI was ordered for viability study. Her Coreg was up-titrated for further optimization of GDMT. Cardiac MRI performed  04/13/20 showed viability in LAD territory and LVEF 33%.  Underwent subsequent coronary stent intervention 05/03/2020 with cardiac catheterization showing severe two-vessel coronary disease with diffuse LAD disease up to 70-80% significant by IVUS and CTO of RCA.  LVEDP was upper normal.  She underwent successful PCI to mid/distal LAD using 2 overlapping stents.  She was kept overnight for extended recovery and was recommended for DAPT for at least 6 months, ideally longer.  While hospitalized she was started on dapagliflozin, Lasix, spironolactone.  Presents today for follow-up.  Reviewed cardiac catheterization, stent intervention, and medication changes.  Reports no chest pain, pressure, tightness.  Reports no shortness of breath at rest.  Endorses dyspnea on exertion with more than usual activity.  She has been walking indoors for exercise on exercise machine.  No orthopnea, PND, lower extreme edema.  Does feel like sometimes she cannot get a good deep breath.  EKGs/Labs/Other Studies Reviewed:   The following studies were reviewed today:  Echo 11/29/19  1. Left ventricular ejection fraction, by estimation, is 25 to 30%. The  left ventricle has severely decreased function. The left ventricle  demonstrates global hypokinesis. The left ventricular internal cavity size  was mildly dilated. There is mild left  ventricular hypertrophy. Left ventricular diastolic parameters are  consistent with Grade I diastolic dysfunction (impaired relaxation).   2. Right ventricular systolic function is normal. The right ventricular  size is normal. Tricuspid regurgitation signal is inadequate for assessing  PA pressure.   3. Left atrial size was mildly dilated.   4. The inferior vena cava is normal in size with greater than 50%  respiratory variability, suggesting right atrial pressure of 3 mmHg.   R/LHC 12/02/19 Conclusions: 1. Significant two-vessel coronary artery disease with diffuse LAD disease of up to  70-80% in the mid/distal vessel and chronic subtotal occlusion of the mid RCA with left-to-right collaterals. 2. Moderately elevated left heart, right heart, and pulmonary artery pressures. 3. Low normal Fick cardiac output/index.   Recommendations: 1. Escalate diuresis and optimize evidence-based heart failure therapy. 2. Medical management of coronary artery disease.  Small vessel size and diffuse disease involving LAD and RCA make percutaneous and surgical revascularization suboptimal.  Coronary stent intervention 05/03/2020 Conclusions: 4. Severe two-vessel coronary artery disease with diffuse LAD disease of up to 70-80% that is significant by IVUS, as well as CTO of RCA. 5. Upper normal LVEDP. 6. Successful IVUS-guided PCI to mid/distal LAD using overlapping Resolute Onyx 2.5 x 38 mm and 2.25 x 34 mm drug-eluting stents with 0% residual stenosis and TIMI-3 flow.   Recommendations: 3. Overnight extended recovery given multivessel CAD and LVEF < 35%. 4. Obtain CXR and UA/urine culture for further evaluation of leukocytosis. 5. Dual antiplatelet therapy with ASA and clopidogrel for at lest 6 months, ideally longer. 6. Recheck lipid panel in AM and escalate statin therapy, as tolerated, if LDL > 70.   EKG:  EKG is ordered today.  The ekg ordered today demonstrates NSR 79 bpm with LVH and inferior Q waves and occasional PVC. No acute ST/T wave changes.   Recent Labs: 11/28/2019: B Natriuretic Peptide 188.0  12/03/2019: Magnesium 2.2 12/26/2019: ALT 20 05/04/2020: Hemoglobin 11.6; Platelets 319 05/14/2020: BUN 12; Creatinine, Ser 0.80; Potassium 4.8; Sodium 137  Recent Lipid Panel    Component Value Date/Time   CHOL 100 05/04/2020 0812   TRIG 64 05/04/2020 0812   HDL 39 (L) 05/04/2020 0812   CHOLHDL 2.6 05/04/2020 0812   VLDL 13 05/04/2020 0812   LDLCALC 48 05/04/2020 0812   LDLDIRECT 76.0 04/23/2019 1047    Home Medications   Current Meds  Medication Sig  . aspirin EC 81 MG tablet  Take 81 mg by mouth daily.  . Calcium Carbonate-Vitamin D (CALCIUM 600+D) 600-400 MG-UNIT tablet Take 1 tablet by mouth 2 (two) times daily.  . carvedilol (COREG) 6.25 MG tablet Take 1.5 tablets (9.375 mg total) by mouth 2 (two) times daily with a meal.  . cetirizine (ZYRTEC) 10 MG tablet Take 10 mg by mouth at bedtime.   . clopidogrel (PLAVIX) 75 MG tablet Take 1 tablet (75 mg total) by mouth daily.  . dapagliflozin propanediol (FARXIGA) 10 MG TABS tablet Take 1 tablet (10 mg total) by mouth daily.  [name] Kitchen FLUoxetine (PROZAC) 20 MG capsule Take 1 capsule by mouth once daily (Patient taking differently: Take 20 mg by mouth daily. )  . fluticasone (FLONASE) 50 MCG/ACT nasal spray Place 1-2 sprays into both nostrils daily as needed for rhinitis.  . furosemide (LASIX) 40 MG tablet Take 1 tablet (40 mg total) by mouth daily.  [name] Kitchen GRAPE SEED EXTRACT PO Take 1 capsule by mouth 2 (two) times daily.   Javier Docker Oil 1000 MG CAPS Take 1,000 mg by mouth at bedtime.  . meclizine (ANTIVERT) 25 MG tablet Take 0.5-1 tablets (12.5-25 mg total) by mouth 3 (three) times daily as needed for dizziness (sedation caution).  . Misc Natural Products (OSTEO BI-FLEX ADV JOINT SHIELD) TABS Take 1 tablet by mouth 2 (two) times daily.  . Multiple Vitamin (MULTIVITAMIN WITH MINERALS) TABS tablet Take 1 tablet by mouth daily.  . Multiple Vitamins-Minerals (PRESERVISION AREDS 2+MULTI VIT) CAPS Take 1 capsule by mouth daily.  [name] Kitchen nystatin cream (MYCOSTATIN) Apply 1 application topically 2 (two) times daily.  . rosuvastatin (CRESTOR) 5 MG tablet Take 2 tablets (10 mg total) by mouth daily.  . sacubitril-valsartan (ENTRESTO) 49-51 MG Take 1 tablet by mouth 2 (two) times daily.  [name] Kitchen spironolactone (ALDACTONE) 25 MG tablet Take 1 tablet (25 mg total) by mouth daily.    Review of Systems   Review of Systems  Constitutional: Negative for chills, fever and malaise/fatigue.  Cardiovascular: Positive for dyspnea on exertion. Negative for chest  pain, leg swelling, near-syncope, orthopnea, palpitations and syncope.  Respiratory: Negative for cough, shortness of breath and wheezing.   Gastrointestinal: Negative for nausea and vomiting.  Neurological: Negative for dizziness, light-headedness and weakness.    All other systems reviewed and are otherwise negative except as noted above.  Physical Exam    VS:  BP 120/66 (BP Location: Right Arm, Patient Position: Sitting, Cuff Size: Normal)   Pulse 79   Ht 5\' 4"  (1.626 m)   Wt 198 lb (89.8 kg)   SpO2 94%   BMI 33.99 kg/m  , BMI Body mass index is 33.99 kg/m. GEN: Well nourished, overweight, well developed, in no acute distress. HEENT: normal. Neck: Supple, no JVD, carotid bruits, or masses. Cardiac: RRR, no murmurs, rubs, or gallops. No clubbing, cyanosis, edema.  Radials/DP/PT 2+ and equal bilaterally.  Respiratory:  Respirations regular and unlabored, clear to auscultation bilaterally. GI: Soft, nontender,  nondistended, BS + x 4. MS: No deformity or atrophy. Skin: Warm and dry, no rash.  Right radial cath site clean, dry, intact with no ecchymosis, hematoma, nor signs of infection Neuro:  Strength and sensation are intact. Psych: Normal affect.  Assessment & Plan    1. Chronic HFpEF -Repeat echo 02/2020 with continued EF 25-30% and gr2DD. Underwent DESx2 to LAD 05/03/20.  Anticipate repeat echo in 3 to 6 months for reassessment of LVEF. Euvolemic, compensated on exam.  NYHA II symptoms of dyspnea.  Present GDMT includes Coreg, dapagliflozin, Lasix, spironolactone, Entresto.  Defer escalation of heart failure therapy doses as her SBP is 120 and tells me her GI system is still adjusting to medications. Plans to participate in cardiac rehab.  BMP today shows normal potassium and electrolytes after initiation of Lasix, spironolactone, dapagliflozin.  2. CAD s/p 2 overlapping DES to LAD 05/03/2020-Significant two-vessel coronary disease is thought to be driving her cardiomyopathy.  She  had cardiac MRI which showed viability in the LAD territory.  As such, underwent DES X2 to LAD 05/03/20.  Known CTO of RCA.  GDMT include DAPT aspirin and Plavix for at least 6 months, ideally longer as well as beta blocker statin.  3. HLD -05/04/2020 LDL of 48.  Continue Rosuvastatin 5mg  daily due to myalgias with multiple statins in the past.   Disposition: Follow upin 2 month(s) with Dr. Saunders Revel or APP.   Loel Dubonnet, NP 05/14/2020, 12:36 PM

## 2020-05-23 ENCOUNTER — Emergency Department (HOSPITAL_COMMUNITY)
Admission: EM | Admit: 2020-05-23 | Discharge: 2020-05-24 | Disposition: A | Discharge status: Home / Self Care | Payer: Medicare Other | Attending: Emergency Medicine | Admitting: Emergency Medicine

## 2020-05-23 ENCOUNTER — Other Ambulatory Visit: Payer: Self-pay

## 2020-05-23 ENCOUNTER — Encounter (HOSPITAL_COMMUNITY): Payer: Self-pay | Admitting: Emergency Medicine

## 2020-05-23 DIAGNOSIS — S39012A Strain of muscle, fascia and tendon of lower back, initial encounter: Secondary | ICD-10-CM | POA: Diagnosis not present

## 2020-05-23 DIAGNOSIS — R52 Pain, unspecified: Secondary | ICD-10-CM | POA: Diagnosis not present

## 2020-05-23 DIAGNOSIS — I5021 Acute systolic (congestive) heart failure: Secondary | ICD-10-CM | POA: Insufficient documentation

## 2020-05-23 DIAGNOSIS — X58XXXA Exposure to other specified factors, initial encounter: Secondary | ICD-10-CM | POA: Insufficient documentation

## 2020-05-23 DIAGNOSIS — Z79899 Other long term (current) drug therapy: Secondary | ICD-10-CM | POA: Diagnosis not present

## 2020-05-23 DIAGNOSIS — I25110 Atherosclerotic heart disease of native coronary artery with unstable angina pectoris: Secondary | ICD-10-CM | POA: Diagnosis not present

## 2020-05-23 DIAGNOSIS — Y929 Unspecified place or not applicable: Secondary | ICD-10-CM | POA: Diagnosis not present

## 2020-05-23 DIAGNOSIS — Y939 Activity, unspecified: Secondary | ICD-10-CM | POA: Diagnosis not present

## 2020-05-23 DIAGNOSIS — Y999 Unspecified external cause status: Secondary | ICD-10-CM | POA: Insufficient documentation

## 2020-05-23 DIAGNOSIS — S3992XA Unspecified injury of lower back, initial encounter: Secondary | ICD-10-CM | POA: Diagnosis present

## 2020-05-23 DIAGNOSIS — I110 Hypertensive heart disease with heart failure: Secondary | ICD-10-CM | POA: Diagnosis not present

## 2020-05-23 DIAGNOSIS — I10 Essential (primary) hypertension: Secondary | ICD-10-CM | POA: Diagnosis not present

## 2020-05-23 DIAGNOSIS — Z7982 Long term (current) use of aspirin: Secondary | ICD-10-CM | POA: Diagnosis not present

## 2020-05-23 DIAGNOSIS — E161 Other hypoglycemia: Secondary | ICD-10-CM | POA: Diagnosis not present

## 2020-05-23 DIAGNOSIS — C50912 Malignant neoplasm of unspecified site of left female breast: Secondary | ICD-10-CM | POA: Insufficient documentation

## 2020-05-23 DIAGNOSIS — S29012A Strain of muscle and tendon of back wall of thorax, initial encounter: Secondary | ICD-10-CM | POA: Diagnosis not present

## 2020-05-23 DIAGNOSIS — R0602 Shortness of breath: Secondary | ICD-10-CM | POA: Diagnosis not present

## 2020-05-23 DIAGNOSIS — R069 Unspecified abnormalities of breathing: Secondary | ICD-10-CM | POA: Diagnosis not present

## 2020-05-23 DIAGNOSIS — Z87891 Personal history of nicotine dependence: Secondary | ICD-10-CM | POA: Diagnosis not present

## 2020-05-23 DIAGNOSIS — T148XXA Other injury of unspecified body region, initial encounter: Secondary | ICD-10-CM

## 2020-05-23 DIAGNOSIS — M549 Dorsalgia, unspecified: Secondary | ICD-10-CM

## 2020-05-23 DIAGNOSIS — E162 Hypoglycemia, unspecified: Secondary | ICD-10-CM | POA: Diagnosis not present

## 2020-05-23 LAB — BASIC METABOLIC PANEL
Anion gap: 13 (ref 5–15)
BUN: 10 mg/dL (ref 8–23)
CO2: 25 mmol/L (ref 22–32)
Calcium: 9.5 mg/dL (ref 8.9–10.3)
Chloride: 99 mmol/L (ref 98–111)
Creatinine, Ser: 0.62 mg/dL (ref 0.44–1.00)
GFR calc Af Amer: >60 mL/min (ref >60)
GFR calc non Af Amer: >60 mL/min (ref >60)
Glucose, Bld: 141 mg/dL — ABNORMAL HIGH (ref 70–99)
Potassium: 4.2 mmol/L (ref 3.5–5.1)
Sodium: 137 mmol/L (ref 135–145)

## 2020-05-23 LAB — CBC
HCT: 46.6 % — ABNORMAL HIGH (ref 36.0–46.0)
Hemoglobin: 14.5 g/dL (ref 12.0–15.0)
MCH: 28.7 pg (ref 26.0–34.0)
MCHC: 31.1 g/dL (ref 30.0–36.0)
MCV: 92.1 fL (ref 80.0–100.0)
Platelets: 571 10*3/uL — ABNORMAL HIGH (ref 150–400)
RBC: 5.06 MIL/uL (ref 3.87–5.11)
RDW: 13.7 % (ref 11.5–15.5)
WBC: 19.1 10*3/uL — ABNORMAL HIGH (ref 4.0–10.5)
nRBC: 0 % (ref 0.0–0.2)

## 2020-05-23 LAB — TROPONIN I (HIGH SENSITIVITY): Troponin I (High Sensitivity): 11 ng/L (ref <18)

## 2020-05-23 NOTE — ED Triage Notes (Signed)
Pt to triage via EMS.  Reports L arm pain that radiates around back to R shoulder since Wednesday. Denies chest pain or SOB.  Pain worse with movement.

## 2020-05-24 MED ORDER — ACETAMINOPHEN 500 MG PO TABS
1000.0000 mg | ORAL_TABLET | Freq: Once | ORAL | Status: AC
  Administered 2020-05-24: 1000 mg via ORAL
  Filled 2020-05-24: qty 2

## 2020-05-24 NOTE — Discharge Instructions (Signed)
You may use over-the-counter Acetaminophen (Tylenol), topical muscle creams such as SalonPas, [employer], Bengay, etc. Please stretch, apply heat, and have massage therapy for additional assistance.

## 2020-05-24 NOTE — ED Provider Notes (Addendum)
[facility] EMERGENCY DEPARTMENT Provider Note  CSN: [id number] Arrival date & time: 05/23/20 1421  Chief Complaint(s) Arm Pain and Shoulder Pain  HPI [name] [name] is a 69 y.o. female   The history is provided by the patient.  Back Pain Location:  Thoracic spine Quality:  Aching Radiates to:  R shoulder Pain severity:  Moderate Onset quality:  Gradual Duration:  5 days Timing:  Constant Progression:  Waxing and waning Chronicity:  New Relieved by:  Being still ("arthritis cream") Worsened by:  Movement, palpation and touching Associated symptoms: no chest pain, no fever, no headaches, no leg pain, no numbness, no paresthesias and no perianal numbness     Patient has a significant cardiac history and is concerned this might be an atypical presentation of heart attack.  She reports that this does not feel like her prior cardiac pain.  Past Medical History Past Medical History:  Diagnosis Date  . Allergic rhinitis   . Avulsion fracture of distal fibula 3/10   and sprain; Left  . Breast cancer [facility]) 2004   Left Breast Cancer  . CHF (congestive heart failure) ([place])   . Coronary artery disease    a. 10/2012 ETT: nl; b. 12/2012 Cath: LM nl, LAD min irregs, D1 min irregs, LCX 30p, RCA 33m, RPDA 40p, EF 50%.  . Diastolic dysfunction    a. 12/2012 Echo: EF 50-55%. No rwma. Gr1 DD. Nl RV fxn. Nl PASP.  [name] Kitchen History of hepatitis B    ?--tested + by [employer]  . HLD (hyperlipidemia)   . HTN (hypertension)   . Osteoarthritis   . Osteopenia    Patient Active Problem List   Diagnosis Date Noted  . Chronic HFrEF (heart failure with reduced ejection fraction) ([place]) 05/03/2020  . HFrEF (heart failure with reduced ejection fraction) ([place]) 03/10/2020  . Ischemic cardiomyopathy 03/10/2020  . Acute systolic congestive heart failure ([place])   . Depression 11/28/2019  . CHF (congestive heart failure), NYHA class I, acute on chronic, diastolic ([place]) 20/35/5974  . Coronary  artery disease involving native coronary artery of native heart with angina pectoris ([place])   . Mixed hyperlipidemia 09/28/2008  . Essential hypertension 09/28/2008  . ALLERGIC RHINITIS 09/28/2008  . DCIS (ductal carcinoma in situ), LEFT, remission 09/28/2008   Home Medication(s) Prior to Admission medications   Medication Sig Start Date End Date Taking? Authorizing Provider  aspirin EC 81 MG tablet Take 81 mg by mouth daily.    [provider]  Calcium Carbonate-Vitamin D (CALCIUM 600+D) 600-400 MG-UNIT tablet Take 1 tablet by mouth 2 (two) times daily.    [provider]  carvedilol (COREG) 6.25 MG tablet Take 1.5 tablets (9.375 mg total) by mouth 2 (two) times daily with a meal. 03/10/20 04/16/29  End, Harrell Gave, MD  cetirizine (ZYRTEC) 10 MG tablet Take 10 mg by mouth at bedtime.     [provider]  clopidogrel (PLAVIX) 75 MG tablet Take 1 tablet (75 mg total) by mouth daily. 04/20/20   End, Harrell Gave, MD  dapagliflozin propanediol (FARXIGA) 10 MG TABS tablet Take 1 tablet (10 mg total) by mouth daily. 05/04/20   Duke, Tami Lin, PA  FLUoxetine (PROZAC) 20 MG capsule Take 1 capsule by mouth once daily Patient taking differently: Take 20 mg by mouth daily.  01/02/20   Copland, Frederico Hamman, MD  fluticasone (FLONASE) 50 MCG/ACT nasal spray Place 1-2 sprays into both nostrils daily as needed for rhinitis. 01/10/18   Tower, Wynelle Fanny, MD  furosemide (LASIX) 40 MG tablet Take 1 tablet (40 mg total) by mouth daily. 01/04/20 04/16/29  Copland, Frederico Hamman, MD  GRAPE SEED EXTRACT PO Take 1 capsule by mouth 2 (two) times daily.     [provider]  Javier Docker Oil 1000 MG CAPS Take 1,000 mg by mouth at bedtime.    [provider]  meclizine (ANTIVERT) 25 MG tablet Take 0.5-1 tablets (12.5-25 mg total) by mouth 3 (three) times daily as needed for dizziness (sedation caution). 03/15/18   Tonia Ghent, MD  Misc Natural Products (OSTEO BI-FLEX ADV JOINT SHIELD) TABS Take 1  tablet by mouth 2 (two) times daily.    [provider]  Multiple Vitamin (MULTIVITAMIN WITH MINERALS) TABS tablet Take 1 tablet by mouth daily.    [provider]  Multiple Vitamins-Minerals (PRESERVISION AREDS 2+MULTI VIT) CAPS Take 1 capsule by mouth daily.    [provider]  nystatin cream (MYCOSTATIN) Apply 1 application topically 2 (two) times daily. 03/18/18   Copland, Frederico Hamman, MD  rosuvastatin (CRESTOR) 5 MG tablet Take 2 tablets (10 mg total) by mouth daily. 05/04/20   Duke, Tami Lin, PA  sacubitril-valsartan (ENTRESTO) 49-51 MG Take 1 tablet by mouth 2 (two) times daily. 12/09/19   Alisa Graff, FNP  spironolactone (ALDACTONE) 25 MG tablet Take 1 tablet (25 mg total) by mouth daily. 01/04/20 04/16/29  Owens Loffler, MD                                                                                                                                    Past Surgical History Past Surgical History:  Procedure Laterality Date  . ABDOMINAL HYSTERECTOMY  1985   endometriosis; anatomy  . BREAST SURGERY  10/2002  . CARDIAC CATHETERIZATION  12/19/12   [facility]; EF 50%  . COLONOSCOPY  8/09   Hyperplastic polyp; repeat in 10 years  . CORONARY STENT INTERVENTION N/A 05/03/2020   Procedure: CORONARY STENT INTERVENTION;  Surgeon: [name], MD;  Location: [place] [facility];  Service: Cardiovascular;  Laterality: N/A;  . HIP FRACTURE SURGERY  06/14/2011   ball replaced (Dr. Earvin Hansen)  . INTRAVASCULAR ULTRASOUND/IVUS N/A 05/03/2020   Procedure: Intravascular Ultrasound/IVUS;  Surgeon: [name], MD;  Location: [place] [facility];  Service: Cardiovascular;  Laterality: N/A;  . LEFT HEART CATH N/A 05/03/2020   Procedure: Left Heart Cath;  Surgeon: [name], MD;  Location: [place] [facility];  Service: Cardiovascular;  Laterality: N/A;  . MASTECTOMY Left 2004  . RIGHT/LEFT HEART CATH AND CORONARY ANGIOGRAPHY N/A 12/02/2019   Procedure: RIGHT/LEFT HEART CATH  AND CORONARY ANGIOGRAPHY;  Surgeon: [name], MD;  Location: [place] [facility];  Service: Cardiovascular;  Laterality: N/A;  . TONSILLECTOMY  1958   Family History Family History  Problem Relation Age of Onset  . Heart attack Father 13  . Heart disease Father   . Sarcoidosis Mother   . Ovarian  cancer Other        Aunt  . Diabetes Other        GP  . Breast cancer Sister 34  . Cancer Sister        breast  . Cancer Paternal Aunt        colon/ovarian    Social History Social History   Tobacco Use  . Smoking status: Former Smoker    Packs/day: 0.50    Years: 10.00    Pack years: 5.00    Types: Cigarettes  . Smokeless tobacco: Former Systems developer    Quit date: 01/24/1986  . Tobacco comment: 1989 quit   Vaping Use  . Vaping Use: Never used  Substance Use Topics  . Alcohol use: No  . Drug use: No   Allergies Sulfur, Ace inhibitors, Codeine, Morphine, and Sulfonamide derivatives  Review of Systems Review of Systems  Constitutional: Negative for fever.  Respiratory: Negative for shortness of breath.   Cardiovascular: Negative for chest pain.  Musculoskeletal: Positive for back pain.  Neurological: Negative for numbness, headaches and paresthesias.   All other systems are reviewed and are negative for acute change except as noted in the HPI  Physical Exam Vital Signs  I have reviewed the triage vital signs BP 130/73 (BP Location: Right Arm)   Pulse 98   Temp 98.7 F (37.1 C) (Oral)   Resp 18   Ht 5\' 4"  (1.626 m)   Wt 87.5 kg   SpO2 94%   BMI 33.13 kg/m   Physical Exam Vitals reviewed.  Constitutional:      General: She is not in acute distress.    Appearance: She is well-developed. She is not diaphoretic.  HENT:     Head: Normocephalic and atraumatic.     Nose: Nose normal.  Eyes:     General: No scleral icterus.       Right eye: No discharge.        Left eye: No discharge.     Conjunctiva/sclera: Conjunctivae normal.     Pupils: Pupils are equal,  round, and reactive to light.  Cardiovascular:     Rate and Rhythm: Normal rate and regular rhythm.     Heart sounds: No murmur heard.  No friction rub. No gallop.   Pulmonary:     Effort: Pulmonary effort is normal. No respiratory distress.     Breath sounds: Normal breath sounds. No stridor. No rales.  Chest:     Comments: S/p left mastectomy Abdominal:     General: There is no distension.     Palpations: Abdomen is soft.     Tenderness: There is no abdominal tenderness.  Musculoskeletal:     Cervical back: Normal range of motion and neck supple. Spasms and tenderness present. No bony tenderness.     Thoracic back: Spasms present. No bony tenderness.       Back:  Skin:    General: Skin is warm and dry.     Findings: No erythema or rash.  Neurological:     Mental Status: She is alert and oriented to person, place, and time.     ED Results and Treatments Labs (all labs ordered are listed, but only abnormal results are displayed) Labs Reviewed  BASIC METABOLIC PANEL - Abnormal; Notable for the following components:      Result Value   Glucose, Bld 141 (*)    All other components within normal limits  CBC - Abnormal; Notable for the following components:   WBC 19.1 (*)  HCT 46.6 (*)    Platelets 571 (*)    All other components within normal limits  TROPONIN I (HIGH SENSITIVITY)  TROPONIN I (HIGH SENSITIVITY)                                                                                                                         EKG  EKG Interpretation  Date/Time:  Sunday May 23 2020 14:48:34 EDT Ventricular Rate:  97 PR Interval:  150 QRS Duration: 106 QT Interval:  338 QTC Calculation: 429 R Axis:   -21 Text Interpretation: Normal sinus rhythm Moderate voltage criteria for LVH, may be normal variant ( R in aVL , Cornell product ) Anterior infarct , age undetermined Abnormal ECG Poor R wave progression When compared with ECG of 05/04/2020, ST abnormality is no  longer present Poor R wave progression is now present Confirmed by Delora Fuel ([id number]) on 05/23/2020 11:12:52 PM      Radiology No results found.  Pertinent labs & imaging results that were available during my care of the patient were reviewed by me and considered in my medical decision making (see chart for details).  Medications Ordered in ED Medications  acetaminophen (TYLENOL) tablet 1,000 mg (has no administration in time range)                                                                                                                                    Procedures Procedures  (including critical care time)  Medical Decision Making / ED Course I have reviewed the nursing notes for this encounter and the patient's prior records (if available in EHR or on provided paperwork).   [name] [name] was evaluated in Emergency Department on 05/24/2020 for the symptoms described in the history of present illness. She was evaluated in the context of the global COVID-19 pandemic, which necessitated consideration that the patient might be at risk for infection with the SARS-CoV-2 virus that causes COVID-19. Institutional protocols and algorithms that pertain to the evaluation of patients at risk for COVID-19 are in a state of rapid change based on information released by regulatory bodies including the CDC and federal and state organizations. These policies and algorithms were followed during the patient's care in the ED.  Upper back pain with palpable trigger point spasms which completely reproduces patient's pain.  She does not have any chest pain or shortness of breath concerning for  cardiac etiology or pulmonary embolism.  EKG without acute ischemic changes or evidence of pericarditis.  Troponin negative.  Given the duration of the patient's pain, feel this is sufficient to rule out ACS.     Reports significant improvement following 1g Tylenol.  Final Clinical Impression(s) / ED  Diagnoses Final diagnoses:  Upper back pain  Muscle strain    The patient appears reasonably screened and/or stabilized for discharge and I doubt any other medical condition or other [facility] requiring further screening, evaluation, or treatment in the ED at this time prior to discharge. Safe for discharge with strict return precautions.  Disposition: Discharge  Condition: Good  I have discussed the results, Dx and Tx plan with the patient/family who expressed understanding and agree(s) with the plan. Discharge instructions discussed at length. The patient/family was given strict return precautions who verbalized understanding of the instructions. No further questions at time of discharge.    ED Discharge Orders    None       Follow Up: Owens Loffler, MD [place] [id number] [phone]  Schedule an appointment as soon as possible for a visit  As needed     This chart was dictated using voice recognition software.  Despite best efforts to proofread,  errors can occur which can change the documentation meaning.     Fatima Blank, MD 05/24/20 [phone]

## 2020-05-24 NOTE — ED Notes (Signed)
Pt states understanding of discharge instructions and at home care. States she will call her sister for a ride home

## 2020-05-26 ENCOUNTER — Ambulatory Visit: Payer: Medicare Other | Admitting: Family Medicine

## 2020-05-27 ENCOUNTER — Other Ambulatory Visit: Payer: Self-pay | Admitting: Family

## 2020-05-27 NOTE — Progress Notes (Signed)
Patient ID: [name] [name], female    DOB: [birth year], 69 y.o.   MRN: [id number]  HPI  [name] [name] is a 69 y/o female with a history of CAD, hyperlipidemia, HTN, breast cancer, previous tobacco use and chronic heart failure.   Echo report from 03/01/20 reviewed and showed an EF of 25-30% along with mild MR. Echo report from 11/29/19 reviewed and showed an EF of 25-30%.  LHC done 05/03/20 showed: 1. Severe two-vessel coronary artery disease with diffuse LAD disease of up to 70-80% that is significant by IVUS, as well as CTO of RCA. 2. Upper normal LVEDP. 3. Successful IVUS-guided PCI to mid/distal LAD using overlapping Resolute Onyx 2.5 x 38 mm and 2.25 x 34 mm drug-eluting stents with 0% residual stenosis and TIMI-3 flow.  RHC/LHC on 12/02/19 showed: 4. Significant two-vessel coronary artery disease with diffuse LAD disease of up to 70-80% in the mid/distal vessel and chronic subtotal occlusion of the mid RCA with left-to-right collaterals. 5. Moderately elevated left heart, right heart, and pulmonary artery pressures. 6. Low normal Fick cardiac output/index.  Was in the ED 05/23/20 due to arm and back pain. EKG normal and troponin negative. Treated and released. Admitted 05/03/20 due to outpatient cath/PCI/stent placement with observation overnight. Admitted 11/28/19 due to acute HF exacerbation. Cardiology consult obtained. Initially needed IV lasix and then transitioned to oral diuretics. Needed oxygen but was then weaned off of it. Elevated troponin thought to be due to demand ischemia. Discharged after 5 days.   She presents today for a follow-up visit with a chief complaint of minimal shortness of breath upon moderate exertion. She describes this as chronic in nature having been present for several years. She has associated constipation, dizziness & right shoulder pain along with this. She denies any difficulty sleeping, abdominal distention, palpitations, pedal edema, chest pain, cough,  fatigue or weight gain.   Past Medical History:  Diagnosis Date  . Allergic rhinitis   . Avulsion fracture of distal fibula 3/10   and sprain; Left  . Breast cancer [facility]) 2004   Left Breast Cancer  . CHF (congestive heart failure) ([place])   . Coronary artery disease    a. 10/2012 ETT: nl; b. 12/2012 Cath: LM nl, LAD min irregs, D1 min irregs, LCX 30p, RCA 58m, RPDA 40p, EF 50%.  . Diastolic dysfunction    a. 12/2012 Echo: EF 50-55%. No rwma. Gr1 DD. Nl RV fxn. Nl PASP.  [name] Kitchen History of hepatitis B    ?--tested + by [employer]  . HLD (hyperlipidemia)   . HTN (hypertension)   . Osteoarthritis   . Osteopenia    Past Surgical History:  Procedure Laterality Date  . ABDOMINAL HYSTERECTOMY  1985   endometriosis; anatomy  . BREAST SURGERY  10/2002  . CARDIAC CATHETERIZATION  12/19/12   [facility]; EF 50%  . COLONOSCOPY  8/09   Hyperplastic polyp; repeat in 10 years  . CORONARY STENT INTERVENTION N/A 05/03/2020   Procedure: CORONARY STENT INTERVENTION;  Surgeon: [name], MD;  Location: [place] [facility];  Service: Cardiovascular;  Laterality: N/A;  . HIP FRACTURE SURGERY  06/14/2011   ball replaced (Dr. Earvin Hansen)  . INTRAVASCULAR ULTRASOUND/IVUS N/A 05/03/2020   Procedure: Intravascular Ultrasound/IVUS;  Surgeon: [name], MD;  Location: [place] [facility];  Service: Cardiovascular;  Laterality: N/A;  . LEFT HEART CATH N/A 05/03/2020   Procedure: Left Heart Cath;  Surgeon: [name], MD;  Location: [place] [facility];  Service: Cardiovascular;  Laterality:  N/A;  . MASTECTOMY Left 2004  . RIGHT/LEFT HEART CATH AND CORONARY ANGIOGRAPHY N/A 12/02/2019   Procedure: RIGHT/LEFT HEART CATH AND CORONARY ANGIOGRAPHY;  Surgeon: [name], MD;  Location: [place] [facility];  Service: Cardiovascular;  Laterality: N/A;  . TONSILLECTOMY  1958   Family History  Problem Relation Age of Onset  . Heart attack Father 17  . Heart disease Father   . Sarcoidosis Mother   . Ovarian cancer  Other        Aunt  . Diabetes Other        GP  . Breast cancer Sister 3  . Cancer Sister        breast  . Cancer Paternal Aunt        colon/ovarian   Social History   Tobacco Use  . Smoking status: Former Smoker    Packs/day: 0.50    Years: 10.00    Pack years: 5.00    Types: Cigarettes  . Smokeless tobacco: Former Systems developer    Quit date: 01/24/1986  . Tobacco comment: 1989 quit   Substance Use Topics  . Alcohol use: No   Allergies  Allergen Reactions  . Sulfur Rash  . Ace Inhibitors Cough  . Codeine Other (See Comments)    Reation:  Hallucinations   . Morphine Other (See Comments)    Reaction:  Hallucinations   . Sulfonamide Derivatives Rash   Prior to Admission medications   Medication Sig Start Date End Date Taking? Authorizing Provider  aspirin EC 81 MG tablet Take 81 mg by mouth daily.   Yes [provider]  Calcium Carbonate-Vitamin D (CALCIUM 600+D) 600-400 MG-UNIT tablet Take 1 tablet by mouth 2 (two) times daily.   Yes [provider]  carvedilol (COREG) 6.25 MG tablet Take 1.5 tablets (9.375 mg total) by mouth 2 (two) times daily with a meal. 03/10/20 04/16/29 Yes End, Harrell Gave, MD  cetirizine (ZYRTEC) 10 MG tablet Take 10 mg by mouth at bedtime.    Yes [provider]  clopidogrel (PLAVIX) 75 MG tablet Take 1 tablet (75 mg total) by mouth daily. 04/20/20  Yes End, Harrell Gave, MD  dapagliflozin propanediol (FARXIGA) 10 MG TABS tablet Take 1 tablet (10 mg total) by mouth daily. 05/04/20  Yes Duke, Tami Lin, PA  FLUoxetine (PROZAC) 20 MG capsule Take 1 capsule by mouth once daily Patient taking differently: Take 20 mg by mouth daily.  01/02/20  Yes Copland, Frederico Hamman, MD  fluticasone (FLONASE) 50 MCG/ACT nasal spray Place 1-2 sprays into both nostrils daily as needed for rhinitis. 01/10/18  Yes Tower, Wynelle Fanny, MD  furosemide (LASIX) 40 MG tablet Take 1 tablet (40 mg total) by mouth daily. 01/04/20 04/16/29 Yes Copland, Frederico Hamman, MD  GRAPE SEED  EXTRACT PO Take 1 capsule by mouth 2 (two) times daily.    Yes [provider]  Javier Docker Oil 1000 MG CAPS Take 1,000 mg by mouth at bedtime.   Yes [provider]  meclizine (ANTIVERT) 25 MG tablet Take 0.5-1 tablets (12.5-25 mg total) by mouth 3 (three) times daily as needed for dizziness (sedation caution). 03/15/18  Yes Tonia Ghent, MD  Misc Natural Products (OSTEO BI-FLEX ADV JOINT SHIELD) TABS Take 1 tablet by mouth 2 (two) times daily.   Yes [provider]  Multiple Vitamin (MULTIVITAMIN WITH MINERALS) TABS tablet Take 1 tablet by mouth daily.   Yes [provider]  Multiple Vitamins-Minerals (PRESERVISION AREDS 2+MULTI VIT) CAPS Take 1 capsule by mouth daily.   Yes  [provider]  nystatin cream (MYCOSTATIN) Apply 1 application topically 2 (two) times daily. 03/18/18  Yes Copland, Frederico Hamman, MD  rosuvastatin (CRESTOR) 5 MG tablet Take 2 tablets (10 mg total) by mouth daily. 05/04/20  Yes Duke, Tami Lin, PA  sacubitril-valsartan (ENTRESTO) 49-51 MG Take 1 tablet by mouth 2 (two) times daily. 12/09/19  Yes Darylene Price A, FNP  spironolactone (ALDACTONE) 25 MG tablet Take 1 tablet (25 mg total) by mouth daily. 01/04/20 04/16/29 Yes Copland, Frederico Hamman, MD     Review of Systems  Constitutional: Positive for appetite change (decreased). Negative for fatigue.  HENT: Negative for congestion, rhinorrhea and sore throat.   Eyes: Negative.   Respiratory: Positive for shortness of breath (minimal). Negative for cough.   Cardiovascular: Negative for chest pain, palpitations and leg swelling.  Gastrointestinal: Positive for constipation. Negative for abdominal distention and abdominal pain.  Endocrine: Negative.   Genitourinary: Negative.   Musculoskeletal: Positive for arthralgias (right shoulder pain). Negative for back pain.  Skin: Negative.   Allergic/Immunologic: Negative.   Neurological: Positive for dizziness (at times). Negative for  light-headedness.  Hematological: Negative for adenopathy. Does not bruise/bleed easily.  Psychiatric/Behavioral: Negative for dysphoric mood and sleep disturbance (sleeping on 1 pillow). The patient is not nervous/anxious.    Vitals:   05/28/20 1002  BP: (!) 125/91  Pulse: 96  Resp: 18  SpO2: 95%  Weight: 193 lb 2 oz (87.6 kg)  Height: 5\' 4"  (1.626 m)   Wt Readings from Last 3 Encounters:  05/28/20 193 lb 2 oz (87.6 kg)  05/23/20 193 lb (87.5 kg)  05/14/20 198 lb (89.8 kg)   Lab Results  Component Value Date   CREATININE 0.62 05/23/2020   CREATININE 0.80 05/14/2020   CREATININE 0.57 05/04/2020    Physical Exam Vitals and nursing note reviewed.  Constitutional:      Appearance: She is well-developed.  HENT:     Head: Normocephalic and atraumatic.  Neck:     Vascular: No JVD.  Cardiovascular:     Rate and Rhythm: Normal rate and regular rhythm.  Pulmonary:     Effort: Pulmonary effort is normal. No respiratory distress.     Breath sounds: No wheezing or rales.  Abdominal:     Palpations: Abdomen is soft.     Tenderness: There is no abdominal tenderness.  Musculoskeletal:     Cervical back: Normal range of motion and neck supple.     Right lower leg: No tenderness. No edema.     Left lower leg: No tenderness. No edema.  Skin:    General: Skin is warm and dry.  Neurological:     General: No focal deficit present.     Mental Status: She is alert and oriented to person, place, and time.  Psychiatric:        Mood and Affect: Mood normal.        Behavior: Behavior normal.     Assessment & Plan:  1: Chronic heart failure with reduced ejection fraction- - NYHA class II - euvolemic today - weighing daily; reminded to call for an overnight weight gain of >2 pounds or a weekly weight gain of >5 pounds - weight down 11 pounds from last visit here 6 months ago - not adding salt and has been reading food labels for sodium content; reminded to closely follow a 2000mg   sodium diet  - saw cardiology Gilford Rile) 05/14/20 - depending on BP, consider titrating up carvedilol or entresto at future visits - BNP 11/28/19  was 188.0 - reports receiving both covid vaccines  2: HTN- - BP looks good today & she hasn't taken her medications yet today - saw PCP (Copland) 02/18/20 - BMP 05/23/20 reviewed and showed sodium 137, potassium 4.2, creatinine 0.62 and GFR >60   Patient did not bring her medications nor a list. Each medication was verbally reviewed with the patient and she was encouraged to bring the bottles to every visit to confirm accuracy of list.  Return in 6 months or sooner for any questions/problems before then.

## 2020-05-28 ENCOUNTER — Other Ambulatory Visit: Payer: Self-pay

## 2020-05-28 ENCOUNTER — Ambulatory Visit: Payer: Medicare Other | Attending: Family | Admitting: Family

## 2020-05-28 ENCOUNTER — Encounter: Payer: Self-pay | Admitting: Family

## 2020-05-28 VITALS — BP 125/91 | HR 96 | Resp 18 | Ht 64.0 in | Wt 193.1 lb

## 2020-05-28 DIAGNOSIS — Z853 Personal history of malignant neoplasm of breast: Secondary | ICD-10-CM | POA: Diagnosis not present

## 2020-05-28 DIAGNOSIS — M25511 Pain in right shoulder: Secondary | ICD-10-CM | POA: Diagnosis not present

## 2020-05-28 DIAGNOSIS — K5900 Constipation, unspecified: Secondary | ICD-10-CM | POA: Diagnosis not present

## 2020-05-28 DIAGNOSIS — I110 Hypertensive heart disease with heart failure: Secondary | ICD-10-CM | POA: Insufficient documentation

## 2020-05-28 DIAGNOSIS — Z7982 Long term (current) use of aspirin: Secondary | ICD-10-CM | POA: Diagnosis not present

## 2020-05-28 DIAGNOSIS — Z885 Allergy status to narcotic agent status: Secondary | ICD-10-CM | POA: Insufficient documentation

## 2020-05-28 DIAGNOSIS — Z955 Presence of coronary angioplasty implant and graft: Secondary | ICD-10-CM | POA: Insufficient documentation

## 2020-05-28 DIAGNOSIS — Z7984 Long term (current) use of oral hypoglycemic drugs: Secondary | ICD-10-CM | POA: Diagnosis not present

## 2020-05-28 DIAGNOSIS — R42 Dizziness and giddiness: Secondary | ICD-10-CM | POA: Insufficient documentation

## 2020-05-28 DIAGNOSIS — Z882 Allergy status to sulfonamides status: Secondary | ICD-10-CM | POA: Diagnosis not present

## 2020-05-28 DIAGNOSIS — Z79899 Other long term (current) drug therapy: Secondary | ICD-10-CM | POA: Insufficient documentation

## 2020-05-28 DIAGNOSIS — I10 Essential (primary) hypertension: Secondary | ICD-10-CM

## 2020-05-28 DIAGNOSIS — Z87891 Personal history of nicotine dependence: Secondary | ICD-10-CM | POA: Diagnosis not present

## 2020-05-28 DIAGNOSIS — I2510 Atherosclerotic heart disease of native coronary artery without angina pectoris: Secondary | ICD-10-CM | POA: Insufficient documentation

## 2020-05-28 DIAGNOSIS — Z8249 Family history of ischemic heart disease and other diseases of the circulatory system: Secondary | ICD-10-CM | POA: Diagnosis not present

## 2020-05-28 DIAGNOSIS — E785 Hyperlipidemia, unspecified: Secondary | ICD-10-CM | POA: Insufficient documentation

## 2020-05-28 DIAGNOSIS — I5022 Chronic systolic (congestive) heart failure: Secondary | ICD-10-CM | POA: Diagnosis not present

## 2020-05-28 NOTE — Patient Instructions (Signed)
Continue weighing daily and call for an overnight weight gain of > 2 pounds or a weekly weight gain of >5 pounds. 

## 2020-06-02 ENCOUNTER — Ambulatory Visit: Payer: Medicare Other | Admitting: Family Medicine

## 2020-06-02 ENCOUNTER — Other Ambulatory Visit: Payer: Self-pay | Admitting: Internal Medicine

## 2020-06-11 ENCOUNTER — Other Ambulatory Visit: Payer: Self-pay | Admitting: Internal Medicine

## 2020-06-11 NOTE — Telephone Encounter (Signed)
Rx request sent to pharmacy.  

## 2020-06-24 ENCOUNTER — Other Ambulatory Visit: Payer: Self-pay | Admitting: Family Medicine

## 2020-06-25 ENCOUNTER — Other Ambulatory Visit: Payer: Self-pay

## 2020-06-25 ENCOUNTER — Encounter: Payer: Medicare Other | Attending: Internal Medicine | Admitting: *Deleted

## 2020-06-25 ENCOUNTER — Other Ambulatory Visit: Payer: Self-pay | Admitting: Family Medicine

## 2020-06-25 DIAGNOSIS — Z955 Presence of coronary angioplasty implant and graft: Secondary | ICD-10-CM | POA: Insufficient documentation

## 2020-06-25 NOTE — Progress Notes (Signed)
Initial telephone encounter completed. Diagnosis can be found CHL 8/23. EP orientation scheduled for 10/19 at 1:30

## 2020-06-29 ENCOUNTER — Other Ambulatory Visit: Payer: Self-pay

## 2020-06-29 VITALS — Ht 65.0 in | Wt 190.7 lb

## 2020-06-29 DIAGNOSIS — Z955 Presence of coronary angioplasty implant and graft: Secondary | ICD-10-CM | POA: Diagnosis not present

## 2020-06-29 NOTE — Progress Notes (Signed)
Cardiac Individual Treatment Plan  Patient Details  Name: [name] [name]  MRN: [id number] Date of Birth: 15-Feb-1951 Referring Provider:     Cardiac Rehab from 06/29/2020 in [facility] Cardiac and Pulmonary Rehab  Referring Provider End      Initial Encounter Date:    Cardiac Rehab from 06/29/2020 in [facility] Cardiac and Pulmonary Rehab  Date 06/29/20      Visit Diagnosis: Status post coronary artery stent placement  Patient's Home Medications on Admission:  Current Outpatient Medications:    aspirin EC 81 MG tablet, Take 81 mg by mouth daily., Disp: , Rfl:    Calcium Carbonate-Vitamin D (CALCIUM 600+D) 600-400 MG-UNIT tablet, Take 1 tablet by mouth 2 (two) times daily., Disp: , Rfl:    carvedilol (COREG) 6.25 MG tablet, TAKE 1.5 TABLETS (9.375 MG TOTAL) BY MOUTH 2 (TWO) TIMES DAILY WITH A MEAL., Disp: 270 tablet, Rfl: 0   cetirizine (ZYRTEC) 10 MG tablet, Take 10 mg by mouth at bedtime. , Disp: , Rfl:    clopidogrel (PLAVIX) 75 MG tablet, Take 1 tablet (75 mg total) by mouth daily., Disp: 30 tablet, Rfl: 6   dapagliflozin propanediol (FARXIGA) 10 MG TABS tablet, Take 1 tablet (10 mg total) by mouth daily., Disp: 90 tablet, Rfl: 3   ENTRESTO 49-51 MG, TAKE 1 TABLET BY MOUTH TWICE A DAY, Disp: 60 tablet, Rfl: 5   FLUoxetine (PROZAC) 20 MG capsule, Take 1 capsule by mouth once daily (Patient taking differently: Take 20 mg by mouth daily. ), Disp: 90 capsule, Rfl: 1   fluticasone (FLONASE) 50 MCG/ACT nasal spray, Place 1-2 sprays into both nostrils daily as needed for rhinitis., Disp: 16 g, Rfl: 3   furosemide (LASIX) 40 MG tablet, TAKE 1 TABLET BY MOUTH EVERY DAY, Disp: 90 tablet, Rfl: 1   GRAPE SEED EXTRACT PO, Take 1 capsule by mouth 2 (two) times daily. , Disp: , Rfl:    Krill Oil 1000 MG CAPS, Take 1,000 mg by mouth at bedtime., Disp: , Rfl:    meclizine (ANTIVERT) 25 MG tablet, Take 0.5-1 tablets (12.5-25 mg total) by mouth 3 (three) times daily as needed for dizziness  (sedation caution)., Disp: 30 tablet, Rfl: 0   Misc Natural Products (OSTEO BI-FLEX ADV JOINT SHIELD) TABS, Take 1 tablet by mouth 2 (two) times daily., Disp: , Rfl:    Multiple Vitamin (MULTIVITAMIN WITH MINERALS) TABS tablet, Take 1 tablet by mouth daily., Disp: , Rfl:    Multiple Vitamins-Minerals (PRESERVISION AREDS 2+MULTI VIT) CAPS, Take 1 capsule by mouth daily., Disp: , Rfl:    nystatin cream (MYCOSTATIN), Apply 1 application topically 2 (two) times daily., Disp: 30 g, Rfl: 2   rosuvastatin (CRESTOR) 5 MG tablet, Take 2 tablets (10 mg total) by mouth daily., Disp: 90 tablet, Rfl: 3   spironolactone (ALDACTONE) 25 MG tablet, TAKE 1 TABLET BY MOUTH EVERY DAY, Disp: 90 tablet, Rfl: 0  Past Medical History: Past Medical History:  Diagnosis Date   Allergic rhinitis    Avulsion fracture of distal fibula 3/10   and sprain; Left   Breast cancer ([place]) 2004   Left Breast Cancer   CHF (congestive heart failure) (HCC)    Coronary artery disease    a. 10/2012 ETT: nl; b. 12/2012 Cath: LM nl, LAD min irregs, D1 min irregs, LCX 30p, RCA 8m RPDA 40p, EF 50%.   Diastolic dysfunction    a. 12/2012 Echo: EF 50-55%. No rwma. Gr1 DD. Nl RV fxn. Nl PASP.   History of hepatitis  B    ?--tested + by [facility]   HLD (hyperlipidemia)    HTN (hypertension)    Osteoarthritis    Osteopenia     Tobacco Use: Social History   Tobacco Use  Smoking Status Former Smoker   Packs/day: 0.50   Years: 10.00   Pack years: 5.00   Types: Cigarettes  Smokeless Tobacco Former Systems developer   Quit date: 01/24/1986  Tobacco Comment   1989 quit     Labs: Recent Review Flowsheet Data    Labs for ITP Cardiac and Pulmonary Rehab Latest Ref Rng & Units 12/06/2017 12/19/2017 04/23/2019 11/29/2019 05/04/2020   Cholestrol 0 - 200 mg/dL 252(H) - 330(H) 155 100   LDLCALC 0 - 99 mg/dL - - - 87 48   LDLDIRECT mg/dL 60.0 - 76.0 - -   HDL >40 mg/dL 38.60(L) - 41.20 35(L) 39(L)   Trlycerides <150 mg/dL 399.0(H) -  439.0 Triglyceride is over 400; calculations on Lipids are invalid.(H) 165(H) 64   Hemoglobin A1c 4.6 - 6.5 % - 5.9 5.7 - -       Exercise Target Goals: Exercise Program Goal: Individual exercise prescription set using results from initial 6 min walk test and THRR while considering  patients activity barriers and safety.   Exercise Prescription Goal: Initial exercise prescription builds to 30-45 minutes a day of aerobic activity, 2-3 days per week.  Home exercise guidelines will be given to patient during program as part of exercise prescription that the participant will acknowledge.   Education: Aerobic Exercise & Resistance Training: - Gives group verbal and written instruction on the various components of exercise. Focuses on aerobic and resistive training programs and the benefits of this training and how to safely progress through these programs..   Education: Exercise & Equipment Safety: - Individual verbal instruction and demonstration of equipment use and safety with use of the equipment.   Cardiac Rehab from 06/29/2020 in [facility] Cardiac and Pulmonary Rehab  Date 06/29/20  Educator AS  Instruction Review Code 1- Verbalizes Understanding      Education: Exercise Physiology & General Exercise Guidelines: - Group verbal and written instruction with models to review the exercise physiology of the cardiovascular system and associated critical values. Provides general exercise guidelines with specific guidelines to those with heart or lung disease.    Education: Flexibility, Balance, Mind/Body Relaxation: Provides group verbal/written instruction on the benefits of flexibility and balance training, including mind/body exercise modes such as yoga, pilates and tai chi.  Demonstration and skill practice provided.   Activity Barriers & Risk Stratification:  Activity Barriers & Cardiac Risk Stratification - 06/25/20 1304      Activity Barriers & Cardiac Risk Stratification    Activity Barriers Right Hip Replacement;Shortness of Breath    Cardiac Risk Stratification High           6 Minute Walk:  6 Minute Walk    Row Name 06/29/20 1443         6 Minute Walk   Phase Initial     Distance 700 feet     Walk Time 4.5 minutes     # of Rest Breaks 3     MPH 1.76     METS 1.92     RPE 15     Perceived Dyspnea  3     VO2 Peak 6.7     Symptoms Yes (comment)     Comments SOB     Resting HR 82 bpm     Resting BP  102/64     Resting Oxygen Saturation  94 %     Exercise Oxygen Saturation  during 6 min walk 94 %     Max Ex. HR 111 bpm     Max Ex. BP 152/74     2 Minute Post BP 104/68            Oxygen Initial Assessment:   Oxygen Re-Evaluation:   Oxygen Discharge (Final Oxygen Re-Evaluation):   Initial Exercise Prescription:  Initial Exercise Prescription - 06/29/20 1400      Date of Initial Exercise RX and Referring Provider   Date 06/29/20    Referring Provider End      Treadmill   MPH 1.2    Grade 0    Minutes 15    METs 1.9      Recumbant Bike   Level 1    RPM 60    Minutes 15    METs 1.9      NuStep   Level 1    SPM 80    Minutes 15    METs 1.9      REL-XR   Level 1    Speed 50    Minutes 15    METs 1.9      T5 Nustep   Level 1    SPM 80    Minutes 15    METs 1.9      Prescription Details   Frequency (times per week) 3    Duration Progress to 30 minutes of continuous aerobic without signs/symptoms of physical distress      Intensity   THRR 40-80% of Max Heartrate 110-137    Ratings of Perceived Exertion 11-15    Perceived Dyspnea 0-4           Perform Capillary Blood Glucose checks as needed.  Exercise Prescription Changes:  Exercise Prescription Changes    Row Name 06/29/20 1400             Response to Exercise   Blood Pressure (Admit) 102/64       Blood Pressure (Exercise) 152/74       Blood Pressure (Exit) 104/68       Heart Rate (Admit) 82 bpm       Heart Rate (Exercise) 111 bpm        Heart Rate (Exit) 84 bpm       Oxygen Saturation (Admit) 94 %       Oxygen Saturation (Exercise) 94 %       Rating of Perceived Exertion (Exercise) 15       Perceived Dyspnea (Exercise) 3       Symptoms SOB              Exercise Comments:   Exercise Goals and Review:  Exercise Goals    Row Name 06/29/20 1459             Exercise Goals   Increase Physical Activity Yes       Intervention Provide advice, education, support and counseling about physical activity/exercise needs.;Develop an individualized exercise prescription for aerobic and resistive training based on initial evaluation findings, risk stratification, comorbidities and participant's personal goals.       Expected Outcomes Short Term: Attend rehab on a regular basis to increase amount of physical activity.;Long Term: Add in home exercise to make exercise part of routine and to increase amount of physical activity.;Long Term: Exercising regularly at least 3-5 days a week.       Increase  Strength and Stamina Yes       Intervention Provide advice, education, support and counseling about physical activity/exercise needs.;Develop an individualized exercise prescription for aerobic and resistive training based on initial evaluation findings, risk stratification, comorbidities and participant's personal goals.       Expected Outcomes Short Term: Increase workloads from initial exercise prescription for resistance, speed, and METs.;Short Term: Perform resistance training exercises routinely during rehab and add in resistance training at home;Long Term: Improve cardiorespiratory fitness, muscular endurance and strength as measured by increased METs and functional capacity (6MWT)       Able to understand and use rate of perceived exertion (RPE) scale Yes       Intervention Provide education and explanation on how to use RPE scale       Expected Outcomes Short Term: Able to use RPE daily in rehab to express subjective intensity  level;Long Term:  Able to use RPE to guide intensity level when exercising independently       Able to understand and use Dyspnea scale Yes       Intervention Provide education and explanation on how to use Dyspnea scale       Expected Outcomes Short Term: Able to use Dyspnea scale daily in rehab to express subjective sense of shortness of breath during exertion;Long Term: Able to use Dyspnea scale to guide intensity level when exercising independently       Knowledge and understanding of Target Heart Rate Range (THRR) Yes       Intervention Provide education and explanation of THRR including how the numbers were predicted and where they are located for reference       Expected Outcomes Short Term: Able to state/look up THRR;Short Term: Able to use daily as guideline for intensity in rehab;Long Term: Able to use THRR to govern intensity when exercising independently       Able to check pulse independently Yes       Intervention Provide education and demonstration on how to check pulse in carotid and radial arteries.;Review the importance of being able to check your own pulse for safety during independent exercise       Expected Outcomes Short Term: Able to explain why pulse checking is important during independent exercise;Long Term: Able to check pulse independently and accurately       Understanding of Exercise Prescription Yes       Intervention Provide education, explanation, and written materials on patient's individual exercise prescription       Expected Outcomes Short Term: Able to explain program exercise prescription;Long Term: Able to explain home exercise prescription to exercise independently              Exercise Goals Re-Evaluation :   Discharge Exercise Prescription (Final Exercise Prescription Changes):  Exercise Prescription Changes - 06/29/20 1400      Response to Exercise   Blood Pressure (Admit) 102/64    Blood Pressure (Exercise) 152/74    Blood Pressure (Exit) 104/68     Heart Rate (Admit) 82 bpm    Heart Rate (Exercise) 111 bpm    Heart Rate (Exit) 84 bpm    Oxygen Saturation (Admit) 94 %    Oxygen Saturation (Exercise) 94 %    Rating of Perceived Exertion (Exercise) 15    Perceived Dyspnea (Exercise) 3    Symptoms SOB           Nutrition:  Target Goals: Understanding of nutrition guidelines, daily intake of sodium <1557m, cholesterol <2037m calories 30% from  fat and 7% or less from saturated fats, daily to have 5 or more servings of fruits and vegetables.  Education: Controlling Sodium/Reading Food Labels -Group verbal and written material supporting the discussion of sodium use in heart healthy nutrition. Review and explanation with models, verbal and written materials for utilization of the food label.   Education: General Nutrition Guidelines/Fats and Fiber: -Group instruction provided by verbal, written material, models and posters to present the general guidelines for heart healthy nutrition. Gives an explanation and review of dietary fats and fiber.   Biometrics:  Pre Biometrics - 06/29/20 1500      Pre Biometrics   Height _0  (1.651 m)    Weight 190 lb 11.2 oz (86.5 kg)    BMI (Calculated) 31.73    Single Leg Stand 6.87 seconds            Nutrition Therapy Plan and Nutrition Goals:   Nutrition Assessments:  Nutrition Assessments - 06/29/20 1502      MEDFICTS Scores   Pre Score 27           MEDIFICTS Score Key:          ?70 Need to make dietary changes          40-70 Heart Healthy Diet         ? 40 Therapeutic Level Cholesterol Diet  Nutrition Goals Re-Evaluation:   Nutrition Goals Discharge (Final Nutrition Goals Re-Evaluation):   Psychosocial: Target Goals: Acknowledge presence or absence of significant depression and/or stress, maximize coping skills, provide positive support system. Participant is able to verbalize types and ability to use techniques and skills needed for reducing stress and  depression.   Education: Depression - Provides group verbal and written instruction on the correlation between heart/lung disease and depressed mood, treatment options, and the stigmas associated with seeking treatment.   Education: Sleep Hygiene -Provides group verbal and written instruction about how sleep can affect your health.  Define sleep hygiene, discuss sleep cycles and impact of sleep habits. Review good sleep hygiene tips.     Education: Stress and Anxiety: - Provides group verbal and written instruction about the health risks of elevated stress and causes of high stress.  Discuss the correlation between heart/lung disease and anxiety and treatment options. Review healthy ways to manage with stress and anxiety.    Initial Review & Psychosocial Screening:  Initial Psych Review & Screening - 06/25/20 1307      Initial Review   Current issues with None Identified      Family Dynamics   Good Support System? Yes      Barriers   Psychosocial barriers to participate in program There are no identifiable barriers or psychosocial needs.;The patient should benefit from training in stress management and relaxation.      Screening Interventions   Interventions Encouraged to exercise;Provide feedback about the scores to participant;To provide support and resources with identified psychosocial needs    Expected Outcomes Short Term goal: Utilizing psychosocial counselor, staff and physician to assist with identification of specific Stressors or current issues interfering with healing process. Setting desired goal for each stressor or current issue identified.;Long Term Goal: Stressors or current issues are controlled or eliminated.;Short Term goal: Identification and review with participant of any Quality of Life or Depression concerns found by scoring the questionnaire.;Long Term goal: The participant improves quality of Life and PHQ9 Scores as seen by post scores and/or verbalization of  changes  Quality of Life Scores:   Quality of Life - 06/29/20 1502      Quality of Life   Select Quality of Life      Quality of Life Scores   Health/Function Pre 18.88 %    Socioeconomic Pre 24.58 %    Psych/Spiritual Pre 24 %    Family Pre 19.67 %    GLOBAL Pre 21.38 %          Scores of 19 and below usually indicate a poorer quality of life in these areas.  A difference of  2-3 points is a clinically meaningful difference.  A difference of 2-3 points in the total score of the Quality of Life Index has been associated with significant improvement in overall quality of life, self-image, physical symptoms, and general health in studies assessing change in quality of life.  PHQ-9: Recent Review Flowsheet Data    Depression screen [facility] 2/9 06/29/2020 08/19/2019 12/06/2017   Decreased Interest _0 Down, Depressed, Hopeless _1 PHQ - 2 Score _2 Altered sleeping 0 1 1   Tired, decreased energy _3 Change in appetite _4 Feeling bad or failure about yourself  1 0 0   Trouble concentrating 0 0 0   Moving slowly or fidgety/restless 0 0 0   Suicidal thoughts 0 0 0   PHQ-9 Score _5 Difficult doing work/chores - Not difficult at all Not difficult at all     Interpretation of Total Score  Total Score Depression Severity:  1-4 = Minimal depression, 5-9 = Mild depression, 10-14 = Moderate depression, 15-19 = Moderately severe depression, 20-27 = Severe depression   Psychosocial Evaluation and Intervention:  Psychosocial Evaluation - 06/25/20 1312      Psychosocial Evaluation & Interventions   Interventions Encouraged to exercise with the program and follow exercise prescription    Comments Jackelyn Poling reports doing okay post stent. She is really struggling with her shortness of breath and her stamina. She hopes to improve with coming to rehab so she can walk her dog and around the store. She states she is a pretty easy going person with little stress. A  family that needed a place to stay currently is staying with her and keeps her company.    Expected Outcomes Short: attend cardiac rehab for education and exercise. Long: develop positive self care habits.    Continue Psychosocial Services  Follow up required by staff           Psychosocial Re-Evaluation:   Psychosocial Discharge (Final Psychosocial Re-Evaluation):   Vocational Rehabilitation: Provide vocational rehab assistance to qualifying candidates.   Vocational Rehab Evaluation & Intervention:  Vocational Rehab - 06/25/20 1306      Initial Vocational Rehab Evaluation & Intervention   Assessment shows need for Vocational Rehabilitation No           Education: Education Goals: Education classes will be provided on a variety of topics geared toward better understanding of heart health and risk factor modification. Participant will state understanding/return demonstration of topics presented as noted by education test scores.  Learning Barriers/Preferences:  Learning Barriers/Preferences - 06/25/20 1306      Learning Barriers/Preferences   Learning Barriers None    Learning Preferences None           General Cardiac Education Topics:  AED/CPR: - Group verbal and written instruction with the use of  models to demonstrate the basic use of the AED with the basic ABC's of resuscitation.   Anatomy & Physiology of the Heart: - Group verbal and written instruction and models provide basic cardiac anatomy and physiology, with the coronary electrical and arterial systems. Review of Valvular disease and Heart Failure   Cardiac Procedures: - Group verbal and written instruction to review commonly prescribed medications for heart disease. Reviews the medication, class of the drug, and side effects. Includes the steps to properly store meds and maintain the prescription regimen. (beta blockers and nitrates)   Cardiac Medications I: - Group verbal and written instruction to  review commonly prescribed medications for heart disease. Reviews the medication, class of the drug, and side effects. Includes the steps to properly store meds and maintain the prescription regimen.   Cardiac Medications II: -Group verbal and written instruction to review commonly prescribed medications for heart disease. Reviews the medication, class of the drug, and side effects. (all other drug classes)    Go Sex-Intimacy & Heart Disease, Get SMART - Goal Setting: - Group verbal and written instruction through game format to discuss heart disease and the return to sexual intimacy. Provides group verbal and written material to discuss and apply goal setting through the application of the S.M.A.R.T. Method.   Other Matters of the Heart: - Provides group verbal, written materials and models to describe Stable Angina and Peripheral Artery. Includes description of the disease process and treatment options available to the cardiac patient.   Infection Prevention: - Provides verbal and written material to individual with discussion of infection control including proper hand washing and proper equipment cleaning during exercise session.   Cardiac Rehab from 06/29/2020 in [facility] Cardiac and Pulmonary Rehab  Date 06/29/20  Educator AS  Instruction Review Code 1- Verbalizes Understanding      Falls Prevention: - Provides verbal and written material to individual with discussion of falls prevention and safety.   Cardiac Rehab from 06/29/2020 in [facility] Cardiac and Pulmonary Rehab  Date 06/29/20  Educator AS  Instruction Review Code 1- Verbalizes Understanding      Other: -Provides group and verbal instruction on various topics (see comments)   Knowledge Questionnaire Score:  Knowledge Questionnaire Score - 06/29/20 1501      Knowledge Questionnaire Score   Pre Score 19/26 exercise, nutrition           Core Components/Risk Factors/Patient Goals at Admission:  Personal Goals and  Risk Factors at Admission - 06/29/20 1504      Core Components/Risk Factors/Patient Goals on Admission    Weight Management Yes    Intervention Weight Management: Develop a combined nutrition and exercise program designed to reach desired caloric intake, while maintaining appropriate intake of nutrient and fiber, sodium and fats, and appropriate energy expenditure required for the weight goal.;Weight Management: Provide education and appropriate resources to help participant work on and attain dietary goals.    Admit Weight 190 lb 11.2 oz (86.5 kg)    Goal Weight: Short Term 185 lb (83.9 kg)    Goal Weight: Long Term 180 lb (81.6 kg)    Expected Outcomes Short Term: Continue to assess and modify interventions until short term weight is achieved;Long Term: Adherence to nutrition and physical activity/exercise program aimed toward attainment of established weight goal;Weight Maintenance: Understanding of the daily nutrition guidelines, which includes 25-35% calories from fat, 7% or less cal from saturated fats, less than $RemoveB'200mg'[id number]$  cholesterol, less than 1.5gm of sodium, & 5 or more servings of  fruits and vegetables daily;Understanding recommendations for meals to include 15-35% energy as protein, 25-35% energy from fat, 35-60% energy from carbohydrates, less than 295m of dietary cholesterol, 20-35 gm of total fiber daily;Understanding of distribution of calorie intake throughout the day with the consumption of 4-5 meals/snacks;Weight Gain: Understanding of general recommendations for a high calorie, high protein meal plan that promotes weight gain by distributing calorie intake throughout the day with the consumption for 4-5 meals, snacks, and/or supplements    Heart Failure Yes    Intervention Provide a combined exercise and nutrition program that is supplemented with education, support and counseling about heart failure. Directed toward relieving symptoms such as shortness of breath, decreased exercise  tolerance, and extremity edema.    Expected Outcomes Improve functional capacity of life;Short term: Attendance in program 2-3 days a week with increased exercise capacity. Reported lower sodium intake. Reported increased fruit and vegetable intake. Reports medication compliance.;Short term: Daily weights obtained and reported for increase. Utilizing diuretic protocols set by physician.;Long term: Adoption of self-care skills and reduction of barriers for early signs and symptoms recognition and intervention leading to self-care maintenance.    Hypertension Yes    Intervention Provide education on lifestyle modifcations including regular physical activity/exercise, weight management, moderate sodium restriction and increased consumption of fresh fruit, vegetables, and low fat dairy, alcohol moderation, and smoking cessation.;Monitor prescription use compliance.    Expected Outcomes Short Term: Continued assessment and intervention until BP is < 140/92mHG in hypertensive participants. < 130/8060mG in hypertensive participants with diabetes, heart failure or chronic kidney disease.;Long Term: Maintenance of blood pressure at goal levels.    Lipids Yes    Intervention Provide education and support for participant on nutrition & aerobic/resistive exercise along with prescribed medications to achieve LDL <75m25mDL >40mg11m Expected Outcomes Short Term: Participant states understanding of desired cholesterol values and is compliant with medications prescribed. Participant is following exercise prescription and nutrition guidelines.;Long Term: Cholesterol controlled with medications as prescribed, with individualized exercise RX and with personalized nutrition plan. Value goals: LDL < 75mg,35m > 40 mg.           Education:Diabetes - Individual verbal and written instruction to review signs/symptoms of diabetes, desired ranges of glucose level fasting, after meals and with exercise. Acknowledge that pre  and post exercise glucose checks will be done for 3 sessions at entry of program.   Education: Know Your Numbers and Risk Factors: -Group verbal and written instruction about important numbers in your health.  Discussion of what are risk factors and how they play a role in the disease process.  Review of Cholesterol, Blood Pressure, Diabetes, and BMI and the role they play in your overall health.   Core Components/Risk Factors/Patient Goals Review:    Core Components/Risk Factors/Patient Goals at Discharge (Final Review):    ITP Comments:  ITP Comments    Row Name 06/25/20 1301           ITP Comments Initial telephone encounter completed. Diagnosis can be found CHL 8/23. EP orientation scheduled for 10/19 at 1:30              Comments: initial ITP

## 2020-06-29 NOTE — Patient Instructions (Signed)
Patient Instructions  Patient Details  Name: [name] [name] MRN: [id number] Date of Birth: 11/29/1950 Referring Provider:  [name], MD  Below are your personal goals for exercise, nutrition, and risk factors. Our goal is to help you stay on track towards obtaining and maintaining these goals. We will be discussing your progress on these goals with you throughout the program.  Initial Exercise Prescription:  Initial Exercise Prescription - 06/29/20 1400      Date of Initial Exercise RX and Referring Provider   Date 06/29/20    Referring Provider End      Treadmill   MPH 1.2    Grade 0    Minutes 15    METs 1.9      Recumbant Bike   Level 1    RPM 60    Minutes 15    METs 1.9      NuStep   Level 1    SPM 80    Minutes 15    METs 1.9      REL-XR   Level 1    Speed 50    Minutes 15    METs 1.9      T5 Nustep   Level 1    SPM 80    Minutes 15    METs 1.9      Prescription Details   Frequency (times per week) 3    Duration Progress to 30 minutes of continuous aerobic without signs/symptoms of physical distress      Intensity   THRR 40-80% of Max Heartrate 110-137    Ratings of Perceived Exertion 11-15    Perceived Dyspnea 0-4           Exercise Goals: Frequency: Be able to perform aerobic exercise two to three times per week in program working toward 2-5 days per week of home exercise.  Intensity: Work with a perceived exertion of 11 (fairly light) - 15 (hard) while following your exercise prescription.  We will make changes to your prescription with you as you progress through the program.   Duration: Be able to do 30 to 45 minutes of continuous aerobic exercise in addition to a 5 minute warm-up and a 5 minute cool-down routine.   Nutrition Goals: Your personal nutrition goals will be established when you do your nutrition analysis with the dietician.  The following are general nutrition guidelines to follow: Cholesterol < 200mg /day Sodium <  1500mg /day Fiber: Women over 50 yrs - 21 grams per day  Personal Goals:  Personal Goals and Risk Factors at Admission - 06/29/20 1504      Core Components/Risk Factors/Patient Goals on Admission    Weight Management Yes    Intervention Weight Management: Develop a combined nutrition and exercise program designed to reach desired caloric intake, while maintaining appropriate intake of nutrient and fiber, sodium and fats, and appropriate energy expenditure required for the weight goal.;Weight Management: Provide education and appropriate resources to help participant work on and attain dietary goals.    Admit Weight 190 lb 11.2 oz (86.5 kg)    Goal Weight: Short Term 185 lb (83.9 kg)    Goal Weight: Long Term 180 lb (81.6 kg)    Expected Outcomes Short Term: Continue to assess and modify interventions until short term weight is achieved;Long Term: Adherence to nutrition and physical activity/exercise program aimed toward attainment of established weight goal;Weight Maintenance: Understanding of the daily nutrition guidelines, which includes 25-35% calories from fat, 7% or less cal from saturated fats, less  than 200mg  cholesterol, less than 1.5gm of sodium, & 5 or more servings of fruits and vegetables daily;Understanding recommendations for meals to include 15-35% energy as protein, 25-35% energy from fat, 35-60% energy from carbohydrates, less than 200mg  of dietary cholesterol, 20-35 gm of total fiber daily;Understanding of distribution of calorie intake throughout the day with the consumption of 4-5 meals/snacks;Weight Gain: Understanding of general recommendations for a high calorie, high protein meal plan that promotes weight gain by distributing calorie intake throughout the day with the consumption for 4-5 meals, snacks, and/or supplements    Heart Failure Yes    Intervention Provide a combined exercise and nutrition program that is supplemented with education, support and counseling about heart  failure. Directed toward relieving symptoms such as shortness of breath, decreased exercise tolerance, and extremity edema.    Expected Outcomes Improve functional capacity of life;Short term: Attendance in program 2-3 days a week with increased exercise capacity. Reported lower sodium intake. Reported increased fruit and vegetable intake. Reports medication compliance.;Short term: Daily weights obtained and reported for increase. Utilizing diuretic protocols set by physician.;Long term: Adoption of self-care skills and reduction of barriers for early signs and symptoms recognition and intervention leading to self-care maintenance.    Hypertension Yes    Intervention Provide education on lifestyle modifcations including regular physical activity/exercise, weight management, moderate sodium restriction and increased consumption of fresh fruit, vegetables, and low fat dairy, alcohol moderation, and smoking cessation.;Monitor prescription use compliance.    Expected Outcomes Short Term: Continued assessment and intervention until BP is < 140/56mm HG in hypertensive participants. < 130/50mm HG in hypertensive participants with diabetes, heart failure or chronic kidney disease.;Long Term: Maintenance of blood pressure at goal levels.    Lipids Yes    Intervention Provide education and support for participant on nutrition & aerobic/resistive exercise along with prescribed medications to achieve LDL 70mg , HDL >40mg .    Expected Outcomes Short Term: Participant states understanding of desired cholesterol values and is compliant with medications prescribed. Participant is following exercise prescription and nutrition guidelines.;Long Term: Cholesterol controlled with medications as prescribed, with individualized exercise RX and with personalized nutrition plan. Value goals: LDL < 70mg , HDL > 40 mg.           Tobacco Use Initial Evaluation: Social History   Tobacco Use  Smoking Status Former Smoker  .  Packs/day: 0.50  . Years: 10.00  . Pack years: 5.00  . Types: Cigarettes  Smokeless Tobacco Former Systems developer  . Quit date: 01/24/1986  Tobacco Comment   1989 quit     Exercise Goals and Review:  Exercise Goals    Row Name 06/29/20 1459             Exercise Goals   Increase Physical Activity Yes       Intervention Provide advice, education, support and counseling about physical activity/exercise needs.;Develop an individualized exercise prescription for aerobic and resistive training based on initial evaluation findings, risk stratification, comorbidities and participant's personal goals.       Expected Outcomes Short Term: Attend rehab on a regular basis to increase amount of physical activity.;Long Term: Add in home exercise to make exercise part of routine and to increase amount of physical activity.;Long Term: Exercising regularly at least 3-5 days a week.       Increase Strength and Stamina Yes       Intervention Provide advice, education, support and counseling about physical activity/exercise needs.;Develop an individualized exercise prescription for aerobic and resistive training based on initial  evaluation findings, risk stratification, comorbidities and participant's personal goals.       Expected Outcomes Short Term: Increase workloads from initial exercise prescription for resistance, speed, and METs.;Short Term: Perform resistance training exercises routinely during rehab and add in resistance training at home;Long Term: Improve cardiorespiratory fitness, muscular endurance and strength as measured by increased METs and functional capacity (6MWT)       Able to understand and use rate of perceived exertion (RPE) scale Yes       Intervention Provide education and explanation on how to use RPE scale       Expected Outcomes Short Term: Able to use RPE daily in rehab to express subjective intensity level;Long Term:  Able to use RPE to guide intensity level when exercising independently        Able to understand and use Dyspnea scale Yes       Intervention Provide education and explanation on how to use Dyspnea scale       Expected Outcomes Short Term: Able to use Dyspnea scale daily in rehab to express subjective sense of shortness of breath during exertion;Long Term: Able to use Dyspnea scale to guide intensity level when exercising independently       Knowledge and understanding of Target Heart Rate Range (THRR) Yes       Intervention Provide education and explanation of THRR including how the numbers were predicted and where they are located for reference       Expected Outcomes Short Term: Able to state/look up THRR;Short Term: Able to use daily as guideline for intensity in rehab;Long Term: Able to use THRR to govern intensity when exercising independently       Able to check pulse independently Yes       Intervention Provide education and demonstration on how to check pulse in carotid and radial arteries.;Review the importance of being able to check your own pulse for safety during independent exercise       Expected Outcomes Short Term: Able to explain why pulse checking is important during independent exercise;Long Term: Able to check pulse independently and accurately       Understanding of Exercise Prescription Yes       Intervention Provide education, explanation, and written materials on patient's individual exercise prescription       Expected Outcomes Short Term: Able to explain program exercise prescription;Long Term: Able to explain home exercise prescription to exercise independently              Copy of goals given to participant.

## 2020-06-30 ENCOUNTER — Other Ambulatory Visit: Payer: Self-pay

## 2020-06-30 ENCOUNTER — Encounter: Payer: Medicare Other | Admitting: *Deleted

## 2020-06-30 DIAGNOSIS — Z955 Presence of coronary angioplasty implant and graft: Secondary | ICD-10-CM | POA: Diagnosis not present

## 2020-06-30 NOTE — Progress Notes (Signed)
Daily Session Note  Patient Details  Name: [name] [name] MRN: [id number] Date of Birth: 10-01-1950 Referring Provider:     Cardiac Rehab from 06/29/2020 in [facility] Cardiac and Pul[facility] Rehab  Referring Provider End      Encounter Date: 06/30/2020  Check In:  Session Check In - 06/30/20 1131      Check-In   Supervising physician immediately available to respond to emergencies See telemetry face sheet for immediately available ER MD    Location [facility]-Cardiac & Pulmonary Rehab    Staff Present Renita Papa, RN Sherryl Barters, MPA, RN;Melissa Franklin RDN, LDN;Joseph [employer], [place], ACSM CEP, Exercise Physiologist    Virtual Visit No    Medication changes reported     No    Fall or balance concerns reported    No    Warm-up and Cool-down Performed on first and last piece of equipment    Resistance Training Performed Yes    VAD Patient? No    PAD/SET Patient? No      Pain Assessment   Currently in Pain? No/denies              Social History   Tobacco Use  Smoking Status Former Smoker  . Packs/day: 0.50  . Years: 10.00  . Pack years: 5.00  . Types: Cigarettes  Smokeless Tobacco Former Systems developer  . Quit date: 01/24/1986  Tobacco Comment   1989 quit     Goals Met:  Independence with exercise equipment Exercise tolerated well No report of cardiac concerns or symptoms Strength training completed today  Goals Unmet:  Not Applicable  Comments: First full day of exercise!  Patient was oriented to gym and equipment including functions, settings, policies, and procedures.  Patient's individual exercise prescription and treatment plan were reviewed.  All starting workloads were established based on the results of the 6 minute walk test done at initial orientation visit.  The plan for exercise progression was also introduced and progression will be customized based on patient's performance and goals.    Dr. Emily Filbert is Medical Director for  [place] and [facility].

## 2020-07-02 ENCOUNTER — Other Ambulatory Visit: Payer: Self-pay

## 2020-07-02 ENCOUNTER — Encounter: Payer: Medicare Other | Admitting: *Deleted

## 2020-07-02 DIAGNOSIS — Z955 Presence of coronary angioplasty implant and graft: Secondary | ICD-10-CM

## 2020-07-02 NOTE — Progress Notes (Signed)
Daily Session Note  Patient Details  Name: [name] [name] MRN: [id number] Date of Birth: 1950/09/14 Referring Provider:     Cardiac Rehab from 06/29/2020 in [facility] Card[facility] and Pulmonary Rehab  Referring Provider End      Encounter Date: 07/02/2020  Check In:  Session Check In - 07/02/20 1112      Check-In   Supervising physician immediately available to respond to emergencies See telemetry face sheet for immediately available ER MD    Location [facility]-Cardiac & Pulmonary Rehab    Staff Present Renita Papa, RN BSN;Joseph [address] [place], [place], [place], CCRP, CCET    Virtual Visit No    Medication changes reported     No    Fall or balance concerns reported    No    Warm-up and Cool-down Performed on first and last piece of equipment    Resistance Training Performed Yes    VAD Patient? No    PAD/SET Patient? No      Pain Assessment   Currently in Pain? No/denies              Social History   Tobacco Use  Smoking Status Former Smoker  . Packs/day: 0.50  . Years: 10.00  . Pack years: 5.00  . Types: Cigarettes  Smokeless Tobacco Former Systems developer  . Quit date: 01/24/1986  Tobacco Comment   1989 quit     Goals Met:  Independence with exercise equipment Exercise tolerated well No report of cardiac concerns or symptoms Strength training completed today  Goals Unmet:  Not Applicable  Comments: Pt able to follow exercise prescription today without complaint.  Will continue to monitor for progression.    Dr. Emily Filbert is Medical Director for [place] and [facility].

## 2020-07-03 ENCOUNTER — Other Ambulatory Visit: Payer: Self-pay | Admitting: Family Medicine

## 2020-07-05 ENCOUNTER — Encounter: Payer: Medicare Other | Admitting: *Deleted

## 2020-07-05 ENCOUNTER — Other Ambulatory Visit: Payer: Self-pay

## 2020-07-05 DIAGNOSIS — Z955 Presence of coronary angioplasty implant and graft: Secondary | ICD-10-CM

## 2020-07-05 NOTE — Progress Notes (Signed)
Daily Session Note  Patient Details  Name: [name] [name] MRN: [id number] Date of Birth: October 03, 1950 Referring Provider:     Cardiac Rehab from 06/29/2020 in [facility] Cardiac and Pulmonary Rehab  Referring Provider End      Encounter Date: 07/05/2020  Check In:  Session Check In - 07/05/20 1112      Check-In   Supervising physician immediately available to respond to emergencies See telemetry face sheet for immediately available ER MD    Location [facility] & Pul[facility] Re[facility]    Staff Present Renita Papa, RN BSN;Joseph Lou Miner, [place] Exercise Physiologist;Kelly Amedeo Plenty, [place], ACSM CEP, Exercise Physiologist    Virtual Visit No    Medication changes reported     No    Fall or balance concerns reported    No    Warm-up and Cool-down Performed on first and last piece of equipment    Resistance Training Performed Yes    VAD Patient? No    PAD/SET Patient? No      Pain Assessment   Currently in Pain? No/denies              Social History   Tobacco Use  Smoking Status Former Smoker  . Packs/day: 0.50  . Years: 10.00  . Pack years: 5.00  . Types: Cigarettes  Smokeless Tobacco Former Systems developer  . Quit date: 01/24/1986  Tobacco Comment   1989 quit     Goals Met:  Independence with exercise equipment Exercise tolerated well No report of cardiac concerns or symptoms Strength training completed today  Goals Unmet:  Not Applicable  Comments: Pt able to follow exercise prescription today without complaint.  Will continue to monitor for progression.    Dr. Emily Filbert is Medical Director for [place] and [facility].

## 2020-07-06 NOTE — Progress Notes (Signed)
Poor phone connection, rescheduled appointment.

## 2020-07-07 ENCOUNTER — Encounter: Payer: Medicare Other | Admitting: *Deleted

## 2020-07-07 ENCOUNTER — Other Ambulatory Visit: Payer: Self-pay

## 2020-07-07 DIAGNOSIS — Z955 Presence of coronary angioplasty implant and graft: Secondary | ICD-10-CM

## 2020-07-07 NOTE — Progress Notes (Signed)
Daily Session Note  Patient Details  Name: [name] [name] MRN: [id number] Date of Birth: 1951/03/22 Referring Provider:     Cardiac Rehab from 06/29/2020 in [facility] Cardiac and Pulmonary Rehab  Referring Provider End      Encounter Date: 07/07/2020  Check In:  Session Check In - 07/07/20 1151      Check-In   Supervising physician immediately available to respond to emergencies See telemetry face sheet for immediately available ER MD    Location [facility]-Cardiac & Pulmonary Rehab    Staff Present Hope Budds RDN, LDN;Joseph Hood RCP,RRT,BSRT;Jenson Beedle Hollister, RN Geralyn Corwin, RN Margurite Auerbach, MS Exercise Physiologist;Jessica Leedey, MA, RCEP, CCRP, CCET    Virtual Visit No    Medication changes reported     No    Fall or balance concerns reported    No    Warm-up and Cool-down Performed on first and last piece of equipment    Resistance Training Performed Yes    VAD Patient? No    PAD/SET Patient? No      Pain Assessment   Currently in Pain? No/denies              Social History   Tobacco Use  Smoking Status Former Smoker  . Packs/day: 0.50  . Years: 10.00  . Pack years: 5.00  . Types: Cigarettes  Smokeless Tobacco Former Systems developer  . Quit date: 01/24/1986  Tobacco Comment   1989 quit     Goals Met:  Independence with exercise equipment Exercise tolerated well No report of cardiac concerns or symptoms Strength training completed today  Goals Unmet:  Not Applicable  Comments: Pt able to follow exercise prescription today without complaint.  Will continue to monitor for progression.    Dr. Emily Filbert is Medical Director for [place] and [facility].

## 2020-07-14 ENCOUNTER — Encounter: Payer: Medicare Other | Attending: Internal Medicine

## 2020-07-14 ENCOUNTER — Encounter: Payer: Self-pay | Admitting: *Deleted

## 2020-07-14 DIAGNOSIS — Z955 Presence of coronary angioplasty implant and graft: Secondary | ICD-10-CM | POA: Insufficient documentation

## 2020-07-14 NOTE — Progress Notes (Signed)
Cardiac Individual Treatment Plan  Patient Details  Name: [name] [name] MRN: [id number] Date of Birth: April 08, 1951 Referring Provider:     Cardiac Rehab from 06/29/2020 in [facility] Cardiac and Pulmonary Rehab  Referring Provider End      Initial Encounter Date:    Cardiac Rehab from 06/29/2020 in [facility] Cardiac and Pulmonary Rehab  Date 06/29/20      Visit Diagnosis: Status post coronary artery stent placement  Patient's Home Medications on Admission:  Current Outpatient Medications:  .  aspirin EC 81 MG tablet, Take 81 mg by mouth daily., Disp: , Rfl:  .  Calcium Carbonate-Vitamin D (CALCIUM 600+D) 600-400 MG-UNIT tablet, Take 1 tablet by mouth 2 (two) times daily., Disp: , Rfl:  .  carvedilol (COREG) 6.25 MG tablet, TAKE 1.5 TABLETS (9.375 MG TOTAL) BY MOUTH 2 (TWO) TIMES DAILY WITH A MEAL., Disp: 270 tablet, Rfl: 0 .  cetirizine (ZYRTEC) 10 MG tablet, Take 10 mg by mouth at bedtime. , Disp: , Rfl:  .  clopidogrel (PLAVIX) 75 MG tablet, Take 1 tablet (75 mg total) by mouth daily., Disp: 30 tablet, Rfl: 6 .  dapagliflozin propanediol (FARXIGA) 10 MG TABS tablet, Take 1 tablet (10 mg total) by mouth daily., Disp: 90 tablet, Rfl: 3 .  ENTRESTO 49-51 MG, TAKE 1 TABLET BY MOUTH TWICE A DAY, Disp: 60 tablet, Rfl: 5 .  FLUoxetine (PROZAC) 20 MG capsule, Take 1 capsule by mouth once daily, Disp: 90 capsule, Rfl: 0 .  fluticasone (FLONASE) 50 MCG/ACT nasal spray, Place 1-2 sprays into both nostrils daily as needed for rhinitis., Disp: 16 g, Rfl: 3 .  furosemide (LASIX) 40 MG tablet, TAKE 1 TABLET BY MOUTH EVERY DAY, Disp: 90 tablet, Rfl: 1 .  GRAPE SEED EXTRACT PO, Take 1 capsule by mouth 2 (two) times daily. , Disp: , Rfl:  .  Krill Oil 1000 MG CAPS, Take 1,000 mg by mouth at bedtime., Disp: , Rfl:  .  meclizine (ANTIVERT) 25 MG tablet, Take 0.5-1 tablets (12.5-25 mg total) by mouth 3 (three) times daily as needed for dizziness (sedation caution)., Disp: 30 tablet, Rfl: 0 .  Misc Natural  Products (OSTEO BI-FLEX ADV JOINT SHIELD) TABS, Take 1 tablet by mouth 2 (two) times daily., Disp: , Rfl:  .  Multiple Vitamin (MULTIVITAMIN WITH MINERALS) TABS tablet, Take 1 tablet by mouth daily., Disp: , Rfl:  .  Multiple Vitamins-Minerals (PRESERVISION AREDS 2+MULTI VIT) CAPS, Take 1 capsule by mouth daily., Disp: , Rfl:  .  nystatin cream (MYCOSTATIN), Apply 1 application topically 2 (two) times daily., Disp: 30 g, Rfl: 2 .  rosuvastatin (CRESTOR) 5 MG tablet, Take 2 tablets (10 mg total) by mouth daily., Disp: 90 tablet, Rfl: 3 .  spironolactone (ALDACTONE) 25 MG tablet, TAKE 1 TABLET BY MOUTH EVERY DAY, Disp: 90 tablet, Rfl: 0  Past Medical History: Past Medical History:  Diagnosis Date  . Allergic rhinitis   . Avulsion fracture of distal fibula 3/10   and sprain; Left  . Breast cancer [facility]) 2004   Left Breast Cancer  . CHF (congestive heart failure) ([place])   . Coronary artery disease    a. 10/2012 ETT: nl; b. 12/2012 Cath: LM nl, LAD min irregs, D1 min irregs, LCX 30p, RCA 48m RPDA 40p, EF 50%.  . Diastolic dysfunction    a. 12/2012 Echo: EF 50-55%. No rwma. Gr1 DD. Nl RV fxn. Nl PASP.  .[name] KitchenHistory of hepatitis B    ?--tested + by [facility] .  HLD (hyperlipidemia)   . HTN (hypertension)   . Osteoarthritis   . Osteopenia     Tobacco Use: Social History   Tobacco Use  Smoking Status Former Smoker  . Packs/day: 0.50  . Years: 10.00  . Pack years: 5.00  . Types: Cigarettes  Smokeless Tobacco Former Systems developer  . Quit date: 01/24/1986  Tobacco Comment   1989 quit     Labs: Recent Review Flowsheet Data    Labs for ITP Cardiac and Pulmonary Rehab Latest Ref Rng & Units 12/06/2017 12/19/2017 04/23/2019 11/29/2019 05/04/2020   Cholestrol 0 - 200 mg/dL 252(H) - 330(H) 155 100   LDLCALC 0 - 99 mg/dL - - - 87 48   LDLDIRECT mg/dL 60.0 - 76.0 - -   HDL >40 mg/dL 38.60(L) - 41.20 35(L) 39(L)   Trlycerides <150 mg/dL 399.0(H) - 439.0 Triglyceride is over 400; calculations on Lipids are  invalid.(H) 165(H) 64   Hemoglobin A1c 4.6 - 6.5 % - 5.9 5.7 - -       Exercise Target Goals: Exercise Program Goal: Individual exercise prescription set using results from initial 6 min walk test and THRR while considering  patient's activity barriers and safety.   Exercise Prescription Goal: Initial exercise prescription builds to 30-45 minutes a day of aerobic activity, 2-3 days per week.  Home exercise guidelines will be given to patient during program as part of exercise prescription that the participant will acknowledge.   Education: Aerobic Exercise & Resistance Training: - Gives group verbal and written instruction on the various components of exercise. Focuses on aerobic and resistive training programs and the benefits of this training and how to safely progress through these programs..   Cardiac Rehab from 07/07/2020 in [facility] Cardiac and Pulmonary Rehab  Date 07/07/20  [facility] 07/07/20,  Aerobic 06/30/20]  Educator [facility]  Instruction Review Code 1- Verbalizes Understanding      Education: Exercise & Equipment Safety: - Individual verbal instruction and demonstration of equipment use and safety with use of the equipment.   Cardiac Rehab from 07/07/2020 in [facility] Cardiac and Pulmonary Rehab  Date 06/29/20  Educator AS  Instruction Review Code 1- Verbalizes Understanding      Education: Exercise Physiology & General Exercise Guidelines: - Group verbal and written instruction with models to review the exercise physiology of the cardiovascular system and associated critical values. Provides general exercise guidelines with specific guidelines to those with heart or lung disease.    Education: Flexibility, Balance, Mind/Body Relaxation: Provides group verbal/written instruction on the benefits of flexibility and balance training, including mind/body exercise modes such as yoga, pilates and tai chi.  Demonstration and skill practice provided.   Activity Barriers & Risk  Stratification:  Activity Barriers & Cardiac Risk Stratification - 06/25/20 1304      Activity Barriers & Cardiac Risk Stratification   Activity Barriers Right Hip Replacement;Shortness of Breath    Cardiac Risk Stratification High           6 Minute Walk:  6 Minute Walk    Row Name 06/29/20 1443         6 Minute Walk   Phase Initial     Distance 700 feet     Walk Time 4.5 minutes     # of Rest Breaks 3     MPH 1.76     METS 1.92     RPE 15     Perceived Dyspnea  3     VO2 Peak 6.7  Symptoms Yes (comment)     Comments SOB     Resting HR 82 bpm     Resting BP 102/64     Resting Oxygen Saturation  94 %     Exercise Oxygen Saturation  during 6 min walk 94 %     Max Ex. HR 111 bpm     Max Ex. BP 152/74     2 Minute Post BP 104/68            Oxygen Initial Assessment:   Oxygen Re-Evaluation:   Oxygen Discharge (Final Oxygen Re-Evaluation):   Initial Exercise Prescription:  Initial Exercise Prescription - 06/29/20 1400      Date of Initial Exercise RX and Referring Provider   Date 06/29/20    Referring Provider End      Treadmill   MPH 1.2    Grade 0    Minutes 15    METs 1.9      Recumbant Bike   Level 1    RPM 60    Minutes 15    METs 1.9      NuStep   Level 1    SPM 80    Minutes 15    METs 1.9      REL-XR   Level 1    Speed 50    Minutes 15    METs 1.9      T5 Nustep   Level 1    SPM 80    Minutes 15    METs 1.9      Prescription Details   Frequency (times per week) 3    Duration Progress to 30 minutes of continuous aerobic without signs/symptoms of physical distress      Intensity   THRR 40-80% of Max Heartrate 110-137    Ratings of Perceived Exertion 11-15    Perceived Dyspnea 0-4           Perform Capillary Blood Glucose checks as needed.  Exercise Prescription Changes:  Exercise Prescription Changes    Row Name 06/29/20 1400 07/07/20 1700           Response to Exercise   Blood Pressure (Admit) 102/64  124/80      Blood Pressure (Exercise) 152/74 158/108      Blood Pressure (Exit) 104/68 122/76      Heart Rate (Admit) 82 bpm 104 bpm      Heart Rate (Exercise) 111 bpm 133 bpm      Heart Rate (Exit) 84 bpm 103 bpm      Oxygen Saturation (Admit) 94 % --      Oxygen Saturation (Exercise) 94 % --      Rating of Perceived Exertion (Exercise) 15 15      Perceived Dyspnea (Exercise) 3 --      Symptoms SOB none      Duration -- Progress to 30 minutes of  aerobic without signs/symptoms of physical distress      Intensity -- THRR unchanged        Progression   Progression -- Continue to progress workloads to maintain intensity without signs/symptoms of physical distress.      Average METs -- 1.92        Resistance Training   Weight -- 3      Reps -- 10-15        Interval Training   Interval Training -- No        Treadmill   MPH -- 1.2      Grade --  0      Minutes -- 15      METs -- 1.9        REL-XR   Level -- 1      Speed -- 50      Minutes -- 15             Exercise Comments:   Exercise Goals and Review:  Exercise Goals    Row Name 06/29/20 1459             Exercise Goals   Increase Physical Activity Yes       Intervention Provide advice, education, support and counseling about physical activity/exercise needs.;Develop an individualized exercise prescription for aerobic and resistive training based on initial evaluation findings, risk stratification, comorbidities and participant's personal goals.       Expected Outcomes Short Term: Attend rehab on a regular basis to increase amount of physical activity.;Long Term: Add in home exercise to make exercise part of routine and to increase amount of physical activity.;Long Term: Exercising regularly at least 3-5 days a week.       Increase Strength and Stamina Yes       Intervention Provide advice, education, support and counseling about physical activity/exercise needs.;Develop an individualized exercise prescription for  aerobic and resistive training based on initial evaluation findings, risk stratification, comorbidities and participant's personal goals.       Expected Outcomes Short Term: Increase workloads from initial exercise prescription for resistance, speed, and METs.;Short Term: Perform resistance training exercises routinely during rehab and add in resistance training at home;Long Term: Improve cardiorespiratory fitness, muscular endurance and strength as measured by increased METs and functional capacity (6MWT)       Able to understand and use rate of perceived exertion (RPE) scale Yes       Intervention Provide education and explanation on how to use RPE scale       Expected Outcomes Short Term: Able to use RPE daily in rehab to express subjective intensity level;Long Term:  Able to use RPE to guide intensity level when exercising independently       Able to understand and use Dyspnea scale Yes       Intervention Provide education and explanation on how to use Dyspnea scale       Expected Outcomes Short Term: Able to use Dyspnea scale daily in rehab to express subjective sense of shortness of breath during exertion;Long Term: Able to use Dyspnea scale to guide intensity level when exercising independently       Knowledge and understanding of Target Heart Rate Range (THRR) Yes       Intervention Provide education and explanation of THRR including how the numbers were predicted and where they are located for reference       Expected Outcomes Short Term: Able to state/look up THRR;Short Term: Able to use daily as guideline for intensity in rehab;Long Term: Able to use THRR to govern intensity when exercising independently       Able to check pulse independently Yes       Intervention Provide education and demonstration on how to check pulse in carotid and radial arteries.;Review the importance of being able to check your own pulse for safety during independent exercise       Expected Outcomes Short Term: Able  to explain why pulse checking is important during independent exercise;Long Term: Able to check pulse independently and accurately       Understanding of Exercise Prescription Yes  Intervention Provide education, explanation, and written materials on patient's individual exercise prescription       Expected Outcomes Short Term: Able to explain program exercise prescription;Long Term: Able to explain home exercise prescription to exercise independently              Exercise Goals Re-Evaluation :  Exercise Goals Re-Evaluation    Row Name 06/30/20 1133 07/07/20 1715           Exercise Goal Re-Evaluation   Exercise Goals Review Increase Physical Activity;Able to understand and use rate of perceived exertion (RPE) scale;Knowledge and understanding of Target Heart Rate Range (THRR);Understanding of Exercise Prescription;Increase Strength and Stamina;Able to check pulse independently Increase Physical Activity;Increase Strength and Stamina      Comments Reviewed RPE and dyspnea scales, THR and program prescription with pt today.  Pt voiced understanding and was given a copy of goals to take home. Debbie tolerates exercise well.  She works in [employer] range and RPE 15.  Staff will monitor progress.      Expected Outcomes Short: Use RPE daily to regulate intensity. Long: Follow program prescription in THR. Short:  attend consistently Long:  increase overall MET level             Discharge Exercise Prescription (Final Exercise Prescription Changes):  Exercise Prescription Changes - 07/07/20 1700      Response to Exercise   Blood Pressure (Admit) 124/80    Blood Pressure (Exercise) 158/108    Blood Pressure (Exit) 122/76    Heart Rate (Admit) 104 bpm    Heart Rate (Exercise) 133 bpm    Heart Rate (Exit) 103 bpm    Rating of Perceived Exertion (Exercise) 15    Symptoms none    Duration Progress to 30 minutes of  aerobic without signs/symptoms of physical distress    Intensity THRR unchanged       Progression   Progression Continue to progress workloads to maintain intensity without signs/symptoms of physical distress.    Average METs 1.92      Resistance Training   Weight 3    Reps 10-15      Interval Training   Interval Training No      Treadmill   MPH 1.2    Grade 0    Minutes 15    METs 1.9      REL-XR   Level 1    Speed 50    Minutes 15           Nutrition:  Target Goals: Understanding of nutrition guidelines, daily intake of sodium <1541m, cholesterol <2041m calories 30% from fat and 7% or less from saturated fats, daily to have 5 or more servings of fruits and vegetables.  Education: Controlling Sodium/Reading Food Labels -Group verbal and written material supporting the discussion of sodium use in heart healthy nutrition. Review and explanation with models, verbal and written materials for utilization of the food label.   Education: General Nutrition Guidelines/Fats and Fiber: -Group instruction provided by verbal, written material, models and posters to present the general guidelines for heart healthy nutrition. Gives an explanation and review of dietary fats and fiber.   Biometrics:  Pre Biometrics - 06/29/20 1500      Pre Biometrics   Height 5' 5"  (1.651 m)    Weight 190 lb 11.2 oz (86.5 kg)    BMI (Calculated) 31.73    Single Leg Stand 6.87 seconds            Nutrition Therapy Plan and  Nutrition Goals:  Nutrition Therapy & Goals - 07/06/20 1018      Nutrition Therapy   Fiber 25 grams    Whole Grain Foods 3 servings    Saturated Fats 12 max. grams    Fruits and Vegetables 8 servings/day    Sodium 1.5 grams      Personal Nutrition Goals   Nutrition Goal LT:    Comments Poor phone connection, rescheduled appointment.           Nutrition Assessments:  Nutrition Assessments - 06/29/20 1502      MEDFICTS Scores   Pre Score 27           MEDIFICTS Score Key:          ?70 Need to make dietary changes          40-70  Heart Healthy Diet         ? 40 Therapeutic Level Cholesterol Diet  Nutrition Goals Re-Evaluation:   Nutrition Goals Discharge (Final Nutrition Goals Re-Evaluation):   Psychosocial: Target Goals: Acknowledge presence or absence of significant depression and/or stress, maximize coping skills, provide positive support system. Participant is able to verbalize types and ability to use techniques and skills needed for reducing stress and depression.   Education: Depression - Provides group verbal and written instruction on the correlation between heart/lung disease and depressed mood, treatment options, and the stigmas associated with seeking treatment.   Education: Sleep Hygiene -Provides group verbal and written instruction about how sleep can affect your health.  Define sleep hygiene, discuss sleep cycles and impact of sleep habits. Review good sleep hygiene tips.     Education: Stress and Anxiety: - Provides group verbal and written instruction about the health risks of elevated stress and causes of high stress.  Discuss the correlation between heart/lung disease and anxiety and treatment options. Review healthy ways to manage with stress and anxiety.    Initial Review & Psychosocial Screening:  Initial Psych Review & Screening - 06/25/20 1307      Initial Review   Current issues with None Identified      Family Dynamics   Good Support System? Yes      Barriers   Psychosocial barriers to participate in program There are no identifiable barriers or psychosocial needs.;The patient should benefit from training in stress management and relaxation.      Screening Interventions   Interventions Encouraged to exercise;Provide feedback about the scores to participant;To provide support and resources with identified psychosocial needs    Expected Outcomes Short Term goal: Utilizing psychosocial counselor, staff and physician to assist with identification of specific Stressors or current  issues interfering with healing process. Setting desired goal for each stressor or current issue identified.;Long Term Goal: Stressors or current issues are controlled or eliminated.;Short Term goal: Identification and review with participant of any Quality of Life or Depression concerns found by scoring the questionnaire.;Long Term goal: The participant improves quality of Life and PHQ9 Scores as seen by post scores and/or verbalization of changes           Quality of Life Scores:   Quality of Life - 06/29/20 1502      Quality of Life   Select Quality of Life      Quality of Life Scores   Health/Function Pre 18.88 %    Socioeconomic Pre 24.58 %    Psych/Spiritual Pre 24 %    Family Pre 19.67 %    GLOBAL Pre 21.38 %  Scores of 19 and below usually indicate a poorer quality of life in these areas.  A difference of  2-3 points is a clinically meaningful difference.  A difference of 2-3 points in the total score of the Quality of Life Index has been associated with significant improvement in overall quality of life, self-image, physical symptoms, and general health in studies assessing change in quality of life.  PHQ-9: Recent Review Flowsheet Data    Depression screen [facility] 2/9 06/29/2020 08/19/2019 12/06/2017   Decreased Interest 1 1 1    Down, Depressed, Hopeless 1 1 1    PHQ - 2 Score 2 2 2    Altered sleeping 0 1 1   Tired, decreased energy 3  1 1    Change in appetite 1 1 1    Feeling bad or failure about yourself  1 0 0   Trouble concentrating 0 0 0   Moving slowly or fidgety/restless 0 0 0   Suicidal thoughts 0 0 0   PHQ-9 Score 7 5 5    Difficult doing work/chores - Not difficult at all Not difficult at all     Interpretation of Total Score  Total Score Depression Severity:  1-4 = Minimal depression, 5-9 = Mild depression, 10-14 = Moderate depression, 15-19 = Moderately severe depression, 20-27 = Severe depression   Psychosocial Evaluation and Intervention:   Psychosocial Evaluation - 06/25/20 1312      Psychosocial Evaluation & Interventions   Interventions Encouraged to exercise with the program and follow exercise prescription    Comments Jackelyn Poling reports doing okay post stent. She is really struggling with her shortness of breath and her stamina. She hopes to improve with coming to rehab so she can walk her dog and around the store. She states she is a pretty easy going person with little stress. A family that needed a place to stay currently is staying with her and keeps her company.    Expected Outcomes Short: attend cardiac rehab for education and exercise. Long: develop positive self care habits.    Continue Psychosocial Services  Follow up required by staff           Psychosocial Re-Evaluation:   Psychosocial Discharge (Final Psychosocial Re-Evaluation):   Vocational Rehabilitation: Provide vocational rehab assistance to qualifying candidates.   Vocational Rehab Evaluation & Intervention:  Vocational Rehab - 06/25/20 1306      Initial Vocational Rehab Evaluation & Intervention   Assessment shows need for Vocational Rehabilitation No           Education: Education Goals: Education classes will be provided on a variety of topics geared toward better understanding of heart health and risk factor modification. Participant will state understanding/return demonstration of topics presented as noted by education test scores.  Learning Barriers/Preferences:  Learning Barriers/Preferences - 06/25/20 1306      Learning Barriers/Preferences   Learning Barriers None    Learning Preferences None           General Cardiac Education Topics:  AED/CPR: - Group verbal and written instruction with the use of models to demonstrate the basic use of the AED with the basic ABC's of resuscitation.   Anatomy & Physiology of the Heart: - Group verbal and written instruction and models provide basic cardiac anatomy and physiology, with the  coronary electrical and arterial systems. Review of Valvular disease and Heart Failure   Cardiac Rehab from 07/07/2020 in [facility] Cardiac and Pulmonary Rehab  Date 07/07/20  Educator [facility]  Instruction Review Code 1- Verbalizes Understanding  Cardiac Procedures: - Group verbal and written instruction to review commonly prescribed medications for heart disease. Reviews the medication, class of the drug, and side effects. Includes the steps to properly store meds and maintain the prescription regimen. (beta blockers and nitrates)   Cardiac Rehab from 07/07/2020 in [facility] Cardiac and Pulmonary Rehab  Date 07/07/20  Educator [facility]  Instruction Review Code 1- Verbalizes Understanding      Cardiac Medications I: - Group verbal and written instruction to review commonly prescribed medications for heart disease. Reviews the medication, class of the drug, and side effects. Includes the steps to properly store meds and maintain the prescription regimen.   Cardiac Medications II: -Group verbal and written instruction to review commonly prescribed medications for heart disease. Reviews the medication, class of the drug, and side effects. (all other drug classes)    Go Sex-Intimacy & Heart Disease, Get SMART - Goal Setting: - Group verbal and written instruction through game format to discuss heart disease and the return to sexual intimacy. Provides group verbal and written material to discuss and apply goal setting through the application of the S.M.A.R.T. Method.   Cardiac Rehab from 07/07/2020 in [facility] Cardiac and Pulmonary Rehab  Date 07/07/20  Educator [facility]  Instruction Review Code 1- Verbalizes Understanding      Other Matters of the Heart: - Provides group verbal, written materials and models to describe Stable Angina and Peripheral Artery. Includes description of the disease process and treatment options available to the cardiac patient.   Infection Prevention: - Provides verbal and written  material to individual with discussion of infection control including proper hand washing and proper equipment cleaning during exercise session.   Cardiac Rehab from 07/07/2020 in [facility] Cardiac and Pulmonary Rehab  Date 06/29/20  Educator AS  Instruction Review Code 1- Verbalizes Understanding      Falls Prevention: - Provides verbal and written material to individual with discussion of falls prevention and safety.   Cardiac Rehab from 07/07/2020 in [facility] Cardiac and Pulmonary Rehab  Date 06/29/20  Educator AS  Instruction Review Code 1- Verbalizes Understanding      Other: -Provides group and verbal instruction on various topics (see comments)   Knowledge Questionnaire Score:  Knowledge Questionnaire Score - 06/29/20 1501      Knowledge Questionnaire Score   Pre Score 19/26 exercise, nutrition           Core Components/Risk Factors/Patient Goals at Admission:  Personal Goals and Risk Factors at Admission - 06/29/20 1504      Core Components/Risk Factors/Patient Goals on Admission    Weight Management Yes    Intervention Weight Management: Develop a combined nutrition and exercise program designed to reach desired caloric intake, while maintaining appropriate intake of nutrient and fiber, sodium and fats, and appropriate energy expenditure required for the weight goal.;Weight Management: Provide education and appropriate resources to help participant work on and attain dietary goals.    Admit Weight 190 lb 11.2 oz (86.5 kg)    Goal Weight: Short Term 185 lb (83.9 kg)    Goal Weight: Long Term 180 lb (81.6 kg)    Expected Outcomes Short Term: Continue to assess and modify interventions until short term weight is achieved;Long Term: Adherence to nutrition and physical activity/exercise program aimed toward attainment of established weight goal;Weight Maintenance: Understanding of the daily nutrition guidelines, which includes 25-35% calories from fat, 7% or less cal from saturated  fats, less than 264m cholesterol, less than 1.5gm of sodium, & 5 or  more servings of fruits and vegetables daily;Understanding recommendations for meals to include 15-35% energy as protein, 25-35% energy from fat, 35-60% energy from carbohydrates, less than 227m of dietary cholesterol, 20-35 gm of total fiber daily;Understanding of distribution of calorie intake throughout the day with the consumption of 4-5 meals/snacks;Weight Gain: Understanding of general recommendations for a high calorie, high protein meal plan that promotes weight gain by distributing calorie intake throughout the day with the consumption for 4-5 meals, snacks, and/or supplements    Heart Failure Yes    Intervention Provide a combined exercise and nutrition program that is supplemented with education, support and counseling about heart failure. Directed toward relieving symptoms such as shortness of breath, decreased exercise tolerance, and extremity edema.    Expected Outcomes Improve functional capacity of life;Short term: Attendance in program 2-3 days a week with increased exercise capacity. Reported lower sodium intake. Reported increased fruit and vegetable intake. Reports medication compliance.;Short term: Daily weights obtained and reported for increase. Utilizing diuretic protocols set by physician.;Long term: Adoption of self-care skills and reduction of barriers for early signs and symptoms recognition and intervention leading to self-care maintenance.    Hypertension Yes    Intervention Provide education on lifestyle modifcations including regular physical activity/exercise, weight management, moderate sodium restriction and increased consumption of fresh fruit, vegetables, and low fat dairy, alcohol moderation, and smoking cessation.;Monitor prescription use compliance.    Expected Outcomes Short Term: Continued assessment and intervention until BP is < 140/931mHG in hypertensive participants. < 130/8058mG in  hypertensive participants with diabetes, heart failure or chronic kidney disease.;Long Term: Maintenance of blood pressure at goal levels.    Lipids Yes    Intervention Provide education and support for participant on nutrition & aerobic/resistive exercise along with prescribed medications to achieve LDL <65m67mDL >40mg72m Expected Outcomes Short Term: Participant states understanding of desired cholesterol values and is compliant with medications prescribed. Participant is following exercise prescription and nutrition guidelines.;Long Term: Cholesterol controlled with medications as prescribed, with individualized exercise RX and with personalized nutrition plan. Value goals: LDL < 65mg,65m > 40 mg.           Education:Diabetes - Individual verbal and written instruction to review signs/symptoms of diabetes, desired ranges of glucose level fasting, after meals and with exercise. Acknowledge that pre and post exercise glucose checks will be done for 3 sessions at entry of program.   Education: Know Your Numbers and Risk Factors: -Group verbal and written instruction about important numbers in your health.  Discussion of what are risk factors and how they play a role in the disease process.  Review of Cholesterol, Blood Pressure, Diabetes, and BMI and the role they play in your overall health.   Core Components/Risk Factors/Patient Goals Review:    Core Components/Risk Factors/Patient Goals at Discharge (Final Review):    ITP Comments:  ITP Comments    Row Name 06/25/20 1301 06/29/20 1514 06/30/20 1133 07/14/20 0642     ITP Comments Initial telephone encounter completed. Diagnosis can be found CHL 8/23. EP orientation scheduled for 10/19 at 1:30 Completed 6MWT and gym orientation. Initial ITP created and sent for review to Dr. Mark MEmily Filbertcal Director. First full day of exercise!  Patient was oriented to gym and equipment including functions, settings, policies, and procedures.   Patient's individual exercise prescription and treatment plan were reviewed.  All starting workloads were established based on the results of the 6 minute walk test done at initial orientation  visit.  The plan for exercise progression was also introduced and progression will be customized based on patient's performance and goals 30 Day review completed. Medical Director ITP review done, changes made as directed, and signed approval by Medical Director.           Comments:

## 2020-07-15 ENCOUNTER — Emergency Department: Payer: Medicare Other

## 2020-07-15 ENCOUNTER — Encounter: Payer: Self-pay | Admitting: Internal Medicine

## 2020-07-15 ENCOUNTER — Other Ambulatory Visit: Payer: Self-pay

## 2020-07-15 ENCOUNTER — Observation Stay: Payer: Medicare Other

## 2020-07-15 ENCOUNTER — Inpatient Hospital Stay
Admission: EM | Admit: 2020-07-15 | Discharge: 2020-07-17 | DRG: 186 | Disposition: A | Discharge status: Home / Self Care | Payer: Medicare Other | Attending: Internal Medicine | Admitting: Internal Medicine

## 2020-07-15 ENCOUNTER — Telehealth: Payer: Self-pay | Admitting: Family Medicine

## 2020-07-15 ENCOUNTER — Ambulatory Visit (INDEPENDENT_AMBULATORY_CARE_PROVIDER_SITE_OTHER): Payer: Medicare Other | Admitting: Internal Medicine

## 2020-07-15 VITALS — BP 132/92 | HR 103 | Ht 64.0 in | Wt 187.0 lb

## 2020-07-15 DIAGNOSIS — Z23 Encounter for immunization: Secondary | ICD-10-CM

## 2020-07-15 DIAGNOSIS — J90 Pleural effusion, not elsewhere classified: Secondary | ICD-10-CM | POA: Diagnosis not present

## 2020-07-15 DIAGNOSIS — M1712 Unilateral primary osteoarthritis, left knee: Secondary | ICD-10-CM | POA: Diagnosis present

## 2020-07-15 DIAGNOSIS — E222 Syndrome of inappropriate secretion of antidiuretic hormone: Secondary | ICD-10-CM | POA: Diagnosis present

## 2020-07-15 DIAGNOSIS — I10 Essential (primary) hypertension: Secondary | ICD-10-CM

## 2020-07-15 DIAGNOSIS — I255 Ischemic cardiomyopathy: Secondary | ICD-10-CM | POA: Diagnosis present

## 2020-07-15 DIAGNOSIS — Z885 Allergy status to narcotic agent status: Secondary | ICD-10-CM

## 2020-07-15 DIAGNOSIS — Z90710 Acquired absence of both cervix and uterus: Secondary | ICD-10-CM

## 2020-07-15 DIAGNOSIS — J309 Allergic rhinitis, unspecified: Secondary | ICD-10-CM | POA: Diagnosis present

## 2020-07-15 DIAGNOSIS — J9600 Acute respiratory failure, unspecified whether with hypoxia or hypercapnia: Secondary | ICD-10-CM

## 2020-07-15 DIAGNOSIS — Z9012 Acquired absence of left breast and nipple: Secondary | ICD-10-CM

## 2020-07-15 DIAGNOSIS — Z20822 Contact with and (suspected) exposure to covid-19: Secondary | ICD-10-CM | POA: Diagnosis not present

## 2020-07-15 DIAGNOSIS — Z7982 Long term (current) use of aspirin: Secondary | ICD-10-CM

## 2020-07-15 DIAGNOSIS — M8580 Other specified disorders of bone density and structure, unspecified site: Secondary | ICD-10-CM | POA: Diagnosis present

## 2020-07-15 DIAGNOSIS — I2510 Atherosclerotic heart disease of native coronary artery without angina pectoris: Secondary | ICD-10-CM | POA: Diagnosis present

## 2020-07-15 DIAGNOSIS — Z86000 Personal history of in-situ neoplasm of breast: Secondary | ICD-10-CM

## 2020-07-15 DIAGNOSIS — I5043 Acute on chronic combined systolic (congestive) and diastolic (congestive) heart failure: Secondary | ICD-10-CM | POA: Diagnosis not present

## 2020-07-15 DIAGNOSIS — R0902 Hypoxemia: Secondary | ICD-10-CM | POA: Diagnosis present

## 2020-07-15 DIAGNOSIS — Z955 Presence of coronary angioplasty implant and graft: Secondary | ICD-10-CM | POA: Diagnosis not present

## 2020-07-15 DIAGNOSIS — Z882 Allergy status to sulfonamides status: Secondary | ICD-10-CM

## 2020-07-15 DIAGNOSIS — J811 Chronic pulmonary edema: Secondary | ICD-10-CM | POA: Diagnosis not present

## 2020-07-15 DIAGNOSIS — J9811 Atelectasis: Secondary | ICD-10-CM | POA: Diagnosis not present

## 2020-07-15 DIAGNOSIS — R0602 Shortness of breath: Secondary | ICD-10-CM

## 2020-07-15 DIAGNOSIS — Z853 Personal history of malignant neoplasm of breast: Secondary | ICD-10-CM

## 2020-07-15 DIAGNOSIS — Z9882 Breast implant status: Secondary | ICD-10-CM

## 2020-07-15 DIAGNOSIS — Z79899 Other long term (current) drug therapy: Secondary | ICD-10-CM

## 2020-07-15 DIAGNOSIS — I110 Hypertensive heart disease with heart failure: Secondary | ICD-10-CM | POA: Diagnosis present

## 2020-07-15 DIAGNOSIS — I5023 Acute on chronic systolic (congestive) heart failure: Secondary | ICD-10-CM | POA: Diagnosis present

## 2020-07-15 DIAGNOSIS — D75838 Other thrombocytosis: Secondary | ICD-10-CM | POA: Diagnosis present

## 2020-07-15 DIAGNOSIS — E871 Hypo-osmolality and hyponatremia: Secondary | ICD-10-CM

## 2020-07-15 DIAGNOSIS — I5022 Chronic systolic (congestive) heart failure: Secondary | ICD-10-CM

## 2020-07-15 DIAGNOSIS — E785 Hyperlipidemia, unspecified: Secondary | ICD-10-CM | POA: Diagnosis present

## 2020-07-15 DIAGNOSIS — Z803 Family history of malignant neoplasm of breast: Secondary | ICD-10-CM

## 2020-07-15 DIAGNOSIS — Z7902 Long term (current) use of antithrombotics/antiplatelets: Secondary | ICD-10-CM

## 2020-07-15 DIAGNOSIS — I712 Thoracic aortic aneurysm, without rupture, unspecified: Secondary | ICD-10-CM

## 2020-07-15 DIAGNOSIS — Z9889 Other specified postprocedural states: Secondary | ICD-10-CM

## 2020-07-15 DIAGNOSIS — Z87891 Personal history of nicotine dependence: Secondary | ICD-10-CM

## 2020-07-15 LAB — BASIC METABOLIC PANEL
Anion gap: 12 (ref 5–15)
BUN: 13 mg/dL (ref 8–23)
CO2: 27 mmol/L (ref 22–32)
Calcium: 9.6 mg/dL (ref 8.9–10.3)
Chloride: 95 mmol/L — ABNORMAL LOW (ref 98–111)
Creatinine, Ser: 0.7 mg/dL (ref 0.44–1.00)
GFR, Estimated: >60 mL/min (ref >60)
Glucose, Bld: 122 mg/dL — ABNORMAL HIGH (ref 70–99)
Potassium: 4.1 mmol/L (ref 3.5–5.1)
Sodium: 134 mmol/L — ABNORMAL LOW (ref 135–145)

## 2020-07-15 LAB — CBC
HCT: 47.1 % — ABNORMAL HIGH (ref 36.0–46.0)
Hemoglobin: 15.1 g/dL — ABNORMAL HIGH (ref 12.0–15.0)
MCH: 28.2 pg (ref 26.0–34.0)
MCHC: 32.1 g/dL (ref 30.0–36.0)
MCV: 88 fL (ref 80.0–100.0)
Platelets: 451 10*3/uL — ABNORMAL HIGH (ref 150–400)
RBC: 5.35 MIL/uL — ABNORMAL HIGH (ref 3.87–5.11)
RDW: 14.5 % (ref 11.5–15.5)
WBC: 15.8 10*3/uL — ABNORMAL HIGH (ref 4.0–10.5)
nRBC: 0 % (ref 0.0–0.2)

## 2020-07-15 LAB — GRAM STAIN

## 2020-07-15 LAB — TROPONIN I (HIGH SENSITIVITY)
Troponin I (High Sensitivity): 11 ng/L (ref <18)
Troponin I (High Sensitivity): 10 ng/L (ref <18)

## 2020-07-15 LAB — LACTATE DEHYDROGENASE, PLEURAL OR PERITONEAL FLUID: LD, Fluid: 113 U/L — ABNORMAL HIGH (ref 3–23)

## 2020-07-15 LAB — BODY FLUID CELL COUNT WITH DIFFERENTIAL
Eos, Fluid: 1 %
Lymphs, Fluid: 18 %
Monocyte-Macrophage-Serous Fluid: 9 %
Neutrophil Count, Fluid: 72 %
Total Nucleated Cell Count, Fluid: 154 cu mm

## 2020-07-15 LAB — ALBUMIN, PLEURAL OR PERITONEAL FLUID: Albumin, Fluid: 3.5 g/dL

## 2020-07-15 LAB — PROTEIN, PLEURAL OR PERITONEAL FLUID: Total protein, fluid: 5.4 g/dL

## 2020-07-15 LAB — GLUCOSE, PLEURAL OR PERITONEAL FLUID: Glucose, Fluid: 111 mg/dL

## 2020-07-15 LAB — TSH: TSH: 2.873 u[IU]/mL (ref 0.350–4.500)

## 2020-07-15 LAB — RESPIRATORY PANEL BY RT PCR (FLU A&B, COVID)
Influenza A by PCR: NEGATIVE
Influenza B by PCR: NEGATIVE
SARS Coronavirus 2 by RT PCR: NEGATIVE

## 2020-07-15 MED ORDER — SACUBITRIL-VALSARTAN 49-51 MG PO TABS
1.0000 | ORAL_TABLET | Freq: Two times a day (BID) | ORAL | Status: DC
  Administered 2020-07-15 – 2020-07-17 (×4): 1 via ORAL
  Filled 2020-07-15 (×5): qty 1

## 2020-07-15 MED ORDER — FUROSEMIDE 40 MG PO TABS
40.0000 mg | ORAL_TABLET | Freq: Every day | ORAL | Status: DC
  Administered 2020-07-15 – 2020-07-17 (×3): 40 mg via ORAL
  Filled 2020-07-15 (×3): qty 1

## 2020-07-15 MED ORDER — CLOPIDOGREL BISULFATE 75 MG PO TABS
75.0000 mg | ORAL_TABLET | Freq: Every day | ORAL | Status: DC
  Administered 2020-07-15 – 2020-07-17 (×3): 75 mg via ORAL
  Filled 2020-07-15 (×3): qty 1

## 2020-07-15 MED ORDER — IOHEXOL 300 MG/ML  SOLN
75.0000 mL | Freq: Once | INTRAMUSCULAR | Status: AC | PRN
  Administered 2020-07-15: 75 mL via INTRAVENOUS

## 2020-07-15 MED ORDER — SPIRONOLACTONE 25 MG PO TABS
25.0000 mg | ORAL_TABLET | Freq: Every day | ORAL | Status: DC
  Administered 2020-07-15 – 2020-07-17 (×3): 25 mg via ORAL
  Filled 2020-07-15 (×4): qty 1

## 2020-07-15 MED ORDER — FLUOXETINE HCL 20 MG PO CAPS
20.0000 mg | ORAL_CAPSULE | Freq: Every day | ORAL | Status: DC
  Administered 2020-07-15 – 2020-07-17 (×3): 20 mg via ORAL
  Filled 2020-07-15 (×3): qty 1

## 2020-07-15 MED ORDER — ROSUVASTATIN CALCIUM 10 MG PO TABS
10.0000 mg | ORAL_TABLET | Freq: Every day | ORAL | Status: DC
  Administered 2020-07-15 – 2020-07-17 (×3): 10 mg via ORAL
  Filled 2020-07-15 (×4): qty 1

## 2020-07-15 MED ORDER — CARVEDILOL 3.125 MG PO TABS
9.3750 mg | ORAL_TABLET | Freq: Two times a day (BID) | ORAL | Status: DC
  Administered 2020-07-15 – 2020-07-17 (×4): 9.375 mg via ORAL
  Filled 2020-07-15: qty 3
  Filled 2020-07-15: qty 2
  Filled 2020-07-15 (×2): qty 3

## 2020-07-15 MED ORDER — DAPAGLIFLOZIN PROPANEDIOL 5 MG PO TABS
10.0000 mg | ORAL_TABLET | Freq: Every day | ORAL | Status: DC
  Administered 2020-07-15 – 2020-07-17 (×3): 10 mg via ORAL
  Filled 2020-07-15 (×3): qty 2

## 2020-07-15 NOTE — Procedures (Signed)
Pre procedural Dx: Symptomatic pleural effusion Post procedural Dx: Same  Successful US guided left sided thoracentesis yielding 1.4 L of serous pleural fluid.   Samples sent to lab for analysis.  EBL: None Complications: None immediate.  Ronny Bacon, MD Pager #: [phone]

## 2020-07-15 NOTE — ED Notes (Signed)
Patient eating dinner tray at this time. 

## 2020-07-15 NOTE — Progress Notes (Signed)
Follow-up Outpatient Visit Date: 07/15/2020  Primary Care Provider: Owens Loffler, MD [address] [place] [address]  Chief Complaint: Shortness of breath  HPI:  [name] [name] is a 69 y.o. 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  She was last seen in our office in early September by Laurann Montana, NP.  She continued to complain of exertional dyspnea and at times felt like she was unable to "get a good breath."  She reported NYHA class II symptoms when seen about 6 weeks ago by Darylene Price, NP, in the heart failure clinic.  Today, [name] [name] reports that she has experienced marked worsening of her shortness of breath.  Even walking from her bed to the bathroom or from the wheelchair in the office to the exam table today makes her very short of breath.  She has to rest for about 5 minutes before she feels back to baseline.  She has not had any edema and has actually lost weight.  She also denies orthopnea.  She reports a few random twinges of nonexertional chest pain but no other discomfort in her chest.  Of note, she was seen in the emergency department in mid September after a drive home from [facility].  She had developed severe pain in her shoulders and arms, which was ultimately attributed to muscle spasm.  Work-up in the ED at that time, was notable for persistent leukocytosis as well as mild thrombocytosis.  High-sensitivity troponin I was negative x1.  EKG was not performed.  She notes occasional chills but no fevers or cough.  --------------------------------------------------------------------------------------------------  Past Medical History:  Diagnosis Date  . Allergic rhinitis   . Avulsion fracture of distal fibula 3/10   and sprain; Left  .  Breast cancer [facility]) 2004   Left Breast Cancer  . CHF (congestive heart failure) ([place])   . Coronary artery disease    a. 10/2012 ETT: nl; b. 12/2012 Cath: LM nl, LAD min irregs, D1 min irregs, LCX 30p, RCA 69m, RPDA 40p, EF 50%.  . Diastolic dysfunction    a. 12/2012 Echo: EF 50-55%. No rwma. Gr1 DD. Nl RV fxn. Nl PASP.  [name] Kitchen History of hepatitis B    ?--tested + by [employer]  . HLD (hyperlipidemia)   . HTN (hypertension)   . Osteoarthritis   . Osteopenia    Past Surgical History:  Procedure Laterality Date  . ABDOMINAL HYSTERECTOMY  1985   endometriosis; anatomy  . BREAST SURGERY  10/2002  . CARDIAC CATHETERIZATION  12/19/12   [facility]; EF 50%  . COLONOSCOPY  8/09   Hyperplastic polyp; repeat in 10 years  . CORONARY STENT INTERVENTION N/A 05/03/2020   Procedure: CORONARY STENT INTERVENTION;  Surgeon: [name], MD;  Location: [facility];  Service: Cardiovascular;  Laterality: N/A;  . HIP FRACTURE SURGERY  06/14/2011   ball replaced (Dr. Earvin Hansen)  . INTRAVASCULAR ULTRASOUND/IVUS N/A 05/03/2020   Procedure: Intravascular Ultrasound/IVUS;  Surgeon: [name], MD;  Location: [place] [facility] [facility];  Service: Cardiovascular;  Laterality: N/A;  . LEFT HEART CATH N/A 05/03/2020   Procedure: Left Heart Cath;  Surgeon: [name], MD;  Location: [facility];  Service: Cardiovascular;  Laterality: N/A;  . MASTECTOMY Left 2004  . RIGHT/LEFT HEART CATH AND CORONARY ANGIOGRAPHY N/A 12/02/2019  Procedure: RIGHT/LEFT HEART CATH AND CORONARY ANGIOGRAPHY;  Surgeon: [name], MD;  Location: [place] [facility];  Service: Cardiovascular;  Laterality: N/A;  . TONSILLECTOMY  1958    Current Meds  Medication Sig  . aspirin EC 81 MG tablet Take 81 mg by mouth daily.  . Calcium Carbonate-Vitamin D (CALCIUM 600+D) 600-400 MG-UNIT tablet Take 1 tablet by mouth 2 (two) times daily.  . carvedilol (COREG) 6.25 MG tablet TAKE 1.5 TABLETS (9.375 MG TOTAL) BY MOUTH 2 (TWO) TIMES  DAILY WITH A MEAL.  . cetirizine (ZYRTEC) 10 MG tablet Take 10 mg by mouth at bedtime.   . clopidogrel (PLAVIX) 75 MG tablet Take 1 tablet (75 mg total) by mouth daily.  . dapagliflozin propanediol (FARXIGA) 10 MG TABS tablet Take 1 tablet (10 mg total) by mouth daily.  [name] Kitchen ENTRESTO 49-51 MG TAKE 1 TABLET BY MOUTH TWICE A DAY  . FLUoxetine (PROZAC) 20 MG capsule Take 1 capsule by mouth once daily  . fluticasone (FLONASE) 50 MCG/ACT nasal spray Place 1-2 sprays into both nostrils daily as needed for rhinitis.  . furosemide (LASIX) 40 MG tablet TAKE 1 TABLET BY MOUTH EVERY DAY  . GRAPE SEED EXTRACT PO Take 1 capsule by mouth 2 (two) times daily.   Javier Docker Oil 1000 MG CAPS Take 1,000 mg by mouth at bedtime.  . meclizine (ANTIVERT) 25 MG tablet Take 0.5-1 tablets (12.5-25 mg total) by mouth 3 (three) times daily as needed for dizziness (sedation caution).  . Misc Natural Products (OSTEO BI-FLEX ADV JOINT SHIELD) TABS Take 1 tablet by mouth 2 (two) times daily.  . Multiple Vitamin (MULTIVITAMIN WITH MINERALS) TABS tablet Take 1 tablet by mouth daily.  [name] Kitchen nystatin cream (MYCOSTATIN) Apply 1 application topically 2 (two) times daily.  . rosuvastatin (CRESTOR) 5 MG tablet Take 2 tablets (10 mg total) by mouth daily.  [name] Kitchen spironolactone (ALDACTONE) 25 MG tablet TAKE 1 TABLET BY MOUTH EVERY DAY    Allergies: Sulfur, Ace inhibitors, Codeine, Morphine, and Sulfonamide derivatives  Social History   Tobacco Use  . Smoking status: Former Smoker    Packs/day: 0.50    Years: 10.00    Pack years: 5.00    Types: Cigarettes  . Smokeless tobacco: Former Systems developer    Quit date: 01/24/1986  . Tobacco comment: 1989 quit   Vaping Use  . Vaping Use: Never used  Substance Use Topics  . Alcohol use: No  . Drug use: No    Family History  Problem Relation Age of Onset  . Heart attack Father 53  . Heart disease Father   . Sarcoidosis Mother   . Ovarian cancer Other        Aunt  . Diabetes Other        GP  .  Breast cancer Sister 15  . Cancer Sister        breast  . Cancer Paternal Aunt        colon/ovarian    Review of Systems: A 12-system review of systems was performed and was negative except as noted in the HPI.  --------------------------------------------------------------------------------------------------  Physical Exam: BP (!) 132/92 (BP Location: Right Arm, Patient Position: Sitting, Cuff Size: Normal)   Pulse (!) 103   Ht 5\' 4"  (1.626 m)   Wt 187 lb (84.8 kg)   SpO2 94%   BMI 32.10 kg/m   General: Chronically ill-appearing woman, seated in a wheelchair. Neck: No JVD or HJR. Lungs: Near absent breath sounds in the left lung.  Right lung is clear. Heart: Regular rate and rhythm without murmurs, rubs, or gallops. Abdomen: Soft, nontender, nondistended. Extremities: Trace nonpitting edema in both legs.  EKG: Sinus tachycardia with PVCs, LVH, poor R wave progression, and nonspecific ST/T changes.  Lab Results  Component Value Date   WBC 19.1 (H) 05/23/2020   HGB 14.5 05/23/2020   HCT 46.6 (H) 05/23/2020   MCV 92.1 05/23/2020   PLT 571 (H) 05/23/2020    Lab Results  Component Value Date   NA 137 05/23/2020   K 4.2 05/23/2020   CL 99 05/23/2020   CO2 25 05/23/2020   BUN 10 05/23/2020   CREATININE 0.62 05/23/2020   GLUCOSE 141 (H) 05/23/2020   ALT 20 12/26/2019    Lab Results  Component Value Date   CHOL 100 05/04/2020   HDL 39 (L) 05/04/2020   LDLCALC 48 05/04/2020   LDLDIRECT 76.0 04/23/2019   TRIG 64 05/04/2020   CHOLHDL 2.6 05/04/2020    --------------------------------------------------------------------------------------------------  ASSESSMENT AND PLAN: Acute respiratory failure and pleural effusion: [name] [name] reports progressive shortness of breath over the last 4 to 6 weeks and now has difficulty moving at all without getting extremely short of breath.  I have performed a bedside echocardiogram which is notable for severely reduced LVEF,  similar to prior echocardiograms.  There is evidence of a large left pleural effusion, which I suspect is primarily driving her shortness of breath.  While this may be related to her heart failure, she otherwise appears euvolemic on exam.  I am concerned about a potential infectious or malignant process given worsening leukocytosis over the last few months based on CBCs as recently as 05/23/2020.  Given her tenuous state, I have recommended that she be taken to the emergency department for further evaluation and admission.  She will need a formal echocardiogram.  Pulmonary embolism and underlying infection need to be excluded.  Ultimately, I think she would benefit from thoracentesis with interventional radiology prior to discharge for diagnostic and therapeutic purposes.  If work-up of the pleural effusion remains unrevealing, I would favor proceeding with right and left heart catheterization to reevaluate her coronary arteries as well as to assess her right and left heart filling pressures.  I have spoken with the ED charge nurse regarding [name] [name]'s condition and my recommendations.  Coronary artery disease: No angina reported, though shortness of breath could be an anginal equivalent.  Echocardiogram at the bedside today shows no significant improvement in severely reduced LVEF following PCI to the LAD in August.  Formal echocardiogram will be obtained.  Dual antiplatelet therapy with aspirin and clopidogrel should be continued.  We may ultimately need to consider repeat catheterization at some point if symptoms persist and cause of pleural effusion remains unclear.  I worry that shoulder and arm pain leading to ED visit in September could have been an anginal equivalent.  EKG at that time did not show any acute changes.  High-sensitivity troponin was normal x1.  Chronic HFrEF: Worsening shortness of breath likely due to pleural effusion.  This could be due to underlying heart failure, though [name] [name]  otherwise does not have evidence of worsening fluid retention.  Further work-up of pleural effusion to be pursued, as outlined above.  For now, we will plan to continue current doses of carvedilol, Entresto, and spironolactone.  We may need to consider repeat cardiac catheterization, as outlined above.  Long-term, [name] [name] may also benefit from referral to the [facility] in [place].  Follow-up: To be determined based on ED evaluation/hospital course.  [name], MD 07/15/2020 10:02 AM

## 2020-07-15 NOTE — ED Notes (Signed)
Message sent to pharmacy regarding unverified meds.

## 2020-07-15 NOTE — ED Notes (Addendum)
Assisted patient to bathroom. Provided warm blankets and pillow.

## 2020-07-15 NOTE — ED Notes (Signed)
Hospitalist at bedside 

## 2020-07-15 NOTE — Patient Instructions (Signed)
Dr End recommends we take you to the Emergency Room for further evaluation.

## 2020-07-15 NOTE — Telephone Encounter (Signed)
Called to r/s appt with [facility] on 08/19/20, but patient was in the ER. Will call back to r/s schedule.

## 2020-07-15 NOTE — ED Provider Notes (Signed)
[facility] Emergency Department Provider Note    First MD Initiated Contact with Patient 07/15/20 1212     (approximate)  I have reviewed the triage vital signs and the nursing notes.   HISTORY  Chief Complaint Shortness of Breath    HPI [name] [name] is a 69 y.o. female who presents to the ER for several weeks of worsening shortness of breath and exertional dyspnea.  Has not been having any measured fevers or temperature.  No recent chest pain.  Was recently diagnosed with CHF and has been compliant with her medications.  She was in cardiology clinic for follow-up today and was noted to have large left-sided pleural effusion which is new.  She is remote history of smoking.  No known lung disease.  Does have a history of breast cancer.    Past Medical History:  Diagnosis Date  . Allergic rhinitis   . Avulsion fracture of distal fibula 3/10   and sprain; Left  . Breast cancer [facility]) 2004   Left Breast Cancer  . CHF (congestive heart failure) ([place])   . Coronary artery disease    a. 10/2012 ETT: nl; b. 12/2012 Cath: LM nl, LAD min irregs, D1 min irregs, LCX 30p, RCA 47m, RPDA 40p, EF 50%.  . Diastolic dysfunction    a. 12/2012 Echo: EF 50-55%. No rwma. Gr1 DD. Nl RV fxn. Nl PASP.  [name] Kitchen History of hepatitis B    ?--tested + by [employer]  . HLD (hyperlipidemia)   . HTN (hypertension)   . Osteoarthritis   . Osteopenia    Family History  Problem Relation Age of Onset  . Heart attack Father 25  . Heart disease Father   . Sarcoidosis Mother   . Ovarian cancer Other        Aunt  . Diabetes Other        GP  . Breast cancer Sister 12  . Cancer Sister        breast  . Cancer Paternal Aunt        colon/ovarian   Past Surgical History:  Procedure Laterality Date  . ABDOMINAL HYSTERECTOMY  1985   endometriosis; anatomy  . BREAST SURGERY  10/2002  . CARDIAC CATHETERIZATION  12/19/12   [facility]; EF 50%  . COLONOSCOPY  8/09   Hyperplastic polyp; repeat in 10  years  . CORONARY STENT INTERVENTION N/A 05/03/2020   Procedure: CORONARY STENT INTERVENTION;  Surgeon: [name], MD;  Location: [place] CV LAB;  Service: Cardiovascular;  Laterality: N/A;  . HIP FRACTURE SURGERY  06/14/2011   ball replaced (Dr. Earvin Hansen)  . INTRAVASCULAR ULTRASOUND/IVUS N/A 05/03/2020   Procedure: Intravascular Ultrasound/IVUS;  Surgeon: [name], MD;  Location: [place] [facility];  Service: Cardiovascular;  Laterality: N/A;  . LEFT HEART CATH N/A 05/03/2020   Procedure: Left Heart Cath;  Surgeon: [name], MD;  Location: [place] [facility];  Service: Cardiovascular;  Laterality: N/A;  . MASTECTOMY Left 2004  . RIGHT/LEFT HEART CATH AND CORONARY ANGIOGRAPHY N/A 12/02/2019   Procedure: RIGHT/LEFT HEART CATH AND CORONARY ANGIOGRAPHY;  Surgeon: [name], MD;  Location: [place] [facility];  Service: Cardiovascular;  Laterality: N/A;  . TONSILLECTOMY  1958   Patient Active Problem List   Diagnosis Date Noted  . Pleural effusion 07/15/2020  . Pleural effusion on left 07/15/2020  . Chronic HFrEF (heart failure with reduced ejection fraction) ([place]) 05/03/2020  . HFrEF (heart failure with reduced ejection fraction) ([place]) 03/10/2020  .  Ischemic cardiomyopathy 03/10/2020  . Acute systolic congestive heart failure ([place])   . Depression 11/28/2019  . Acute respiratory failure ([place]) 11/28/2019  . CHF (congestive heart failure), NYHA class I, acute on chronic, diastolic ([place]) 41/74/0814  . Coronary artery disease involving native coronary artery of native heart without angina pectoris   . Mixed hyperlipidemia 09/28/2008  . Essential hypertension 09/28/2008  . ALLERGIC RHINITIS 09/28/2008  . DCIS (ductal carcinoma in situ), LEFT, remission 09/28/2008      Prior to Admission medications   Medication Sig Start Date End Date Taking? Authorizing Provider  aspirin EC 81 MG tablet Take 81 mg by mouth daily.    [provider]  Calcium  Carbonate-Vitamin D (CALCIUM 600+D) 600-400 MG-UNIT tablet Take 1 tablet by mouth 2 (two) times daily.    [provider]  carvedilol (COREG) 6.25 MG tablet TAKE 1.5 TABLETS (9.375 MG TOTAL) BY MOUTH 2 (TWO) TIMES DAILY WITH A MEAL. 06/02/20 07/15/20  End, Harrell Gave, MD  cetirizine (ZYRTEC) 10 MG tablet Take 10 mg by mouth at bedtime.     [provider]  clopidogrel (PLAVIX) 75 MG tablet Take 1 tablet (75 mg total) by mouth daily. 04/20/20   End, Harrell Gave, MD  dapagliflozin propanediol (FARXIGA) 10 MG TABS tablet Take 1 tablet (10 mg total) by mouth daily. 05/04/20   Duke, Tami Lin, PA  ENTRESTO 49-51 MG TAKE 1 TABLET BY MOUTH TWICE A DAY 06/11/20   End, Harrell Gave, MD  FLUoxetine (PROZAC) 20 MG capsule Take 1 capsule by mouth once daily Patient taking differently: Take 20 mg by mouth daily.  07/05/20   Copland, Frederico Hamman, MD  fluticasone (FLONASE) 50 MCG/ACT nasal spray Place 1-2 sprays into both nostrils daily as needed for rhinitis. 01/10/18   Tower, Wynelle Fanny, MD  furosemide (LASIX) 40 MG tablet TAKE 1 TABLET BY MOUTH EVERY DAY Patient taking differently: Take 40 mg by mouth daily.  06/26/20   Copland, Frederico Hamman, MD  GRAPE SEED EXTRACT PO Take 1 capsule by mouth 2 (two) times daily.     [provider]  Javier Docker Oil 1000 MG CAPS Take 1,000 mg by mouth at bedtime.    [provider]  meclizine (ANTIVERT) 25 MG tablet Take 0.5-1 tablets (12.5-25 mg total) by mouth 3 (three) times daily as needed for dizziness (sedation caution). 03/15/18   Tonia Ghent, MD  Misc Natural Products (OSTEO BI-FLEX ADV JOINT SHIELD) TABS Take 1 tablet by mouth 2 (two) times daily.    [provider]  Multiple Vitamin (MULTIVITAMIN WITH MINERALS) TABS tablet Take 1 tablet by mouth daily.    [provider]  nystatin cream (MYCOSTATIN) Apply 1 application topically 2 (two) times daily. 03/18/18   Copland, Frederico Hamman, MD  rosuvastatin (CRESTOR) 5 MG tablet Take 2 tablets (10  mg total) by mouth daily. 05/04/20   Duke, Tami Lin, PA  spironolactone (ALDACTONE) 25 MG tablet TAKE 1 TABLET BY MOUTH EVERY DAY Patient taking differently: Take 25 mg by mouth daily.  06/24/20   Copland, Frederico Hamman, MD    Allergies Sulfur, Ace inhibitors, Codeine, Morphine, and Sulfonamide derivatives    Social History Social History   Tobacco Use  . Smoking status: Former Smoker    Packs/day: 0.50    Years: 10.00    Pack years: 5.00    Types: Cigarettes  . Smokeless tobacco: Former Systems developer    Quit date: 01/24/1986  . Tobacco comment: 1989 quit   Vaping Use  . Vaping Use: Never used  Substance Use Topics  . Alcohol use: No  . Drug use: No    Review of Systems Patient denies headaches, rhinorrhea, blurry vision, numbness, shortness of breath, chest pain, edema, cough, abdominal pain, nausea, vomiting, diarrhea, dysuria, fevers, rashes or hallucinations unless otherwise stated above in HPI. ____________________________________________   PHYSICAL EXAM:  VITAL SIGNS: Vitals:   07/15/20 1228 07/15/20 1441  BP: (!) 158/94 129/71  Pulse: 90 88  Resp: (!) 22   Temp:    SpO2: 94% 96%    Constitutional: Alert and oriented.  Eyes: Conjunctivae are normal.  Head: Atraumatic. Nose: No congestion/rhinnorhea. Mouth/Throat: Mucous membranes are moist.   Neck: No stridor. Painless ROM.  Cardiovascular: Normal rate, regular rhythm. Grossly normal heart sounds.  Good peripheral circulation. Respiratory: Normal respiratory effort.  No retractions. Lungs  Diminished in left hemithorax Gastrointestinal: Soft and nontender. No distention. No abdominal bruits. No CVA tenderness. Genitourinary:  Musculoskeletal: No lower extremity tenderness nor edema.  No joint effusions. Neurologic:  Normal speech and language. No gross focal neurologic deficits are appreciated. No facial droop Skin:  Skin is warm, dry and intact. No rash noted. Psychiatric: Mood and affect are normal. Speech and  behavior are normal.  ____________________________________________   LABS (all labs ordered are listed, but only abnormal results are displayed)  Results for orders placed or performed during the hospital encounter of 07/15/20 (from the past 24 hour(s))  Basic metabolic panel     Status: Abnormal   Collection Time: 07/15/20 11:08 AM  Result Value Ref Range   Sodium 134 (L) 135 - 145 mmol/L   Potassium 4.1 3.5 - 5.1 mmol/L   Chloride 95 (L) 98 - 111 mmol/L   CO2 27 22 - 32 mmol/L   Glucose, Bld 122 (H) 70 - 99 mg/dL   BUN 13 8 - 23 mg/dL   Creatinine, Ser 0.70 0.44 - 1.00 mg/dL   Calcium 9.6 8.9 - 10.3 mg/dL   GFR, Estimated >60 >60 mL/min   Anion gap 12 5 - 15  CBC     Status: Abnormal   Collection Time: 07/15/20 11:08 AM  Result Value Ref Range   WBC 15.8 (H) 4.0 - 10.5 K/uL   RBC 5.35 (H) 3.87 - 5.11 MIL/uL   Hemoglobin 15.1 (H) 12.0 - 15.0 g/dL   HCT 47.1 (H) 36 - 46 %   MCV 88.0 80.0 - 100.0 fL   MCH 28.2 26.0 - 34.0 pg   MCHC 32.1 30.0 - 36.0 g/dL   RDW 14.5 11.5 - 15.5 %   Platelets 451 (H) 150 - 400 K/uL   nRBC 0.0 0.0 - 0.2 %  Troponin I (High Sensitivity)     Status: None   Collection Time: 07/15/20 11:08 AM  Result Value Ref Range   Troponin I (High Sensitivity) 11 <18 ng/L  Respiratory Panel by RT PCR (Flu A&B, Covid) - Nasopharyngeal Swab     Status: None   Collection Time: 07/15/20 12:28 PM   Specimen: Nasopharyngeal Swab  Result Value Ref Range   SARS Coronavirus 2 by RT PCR NEGATIVE NEGATIVE   Influenza A by PCR NEGATIVE NEGATIVE   Influenza B by PCR NEGATIVE NEGATIVE  Troponin I (High Sensitivity)     Status: None   Collection Time: 07/15/20  1:24 PM  Result Value Ref Range   Troponin I (High Sensitivity) 10 <18 ng/L   ____________________________________________  EKG My review and personal interpretation at Time: 11:10   Indication: sob  Rate: 95  Rhythm:  sinus Axis: normal Other: normal intervals, no  stemi ____________________________________________  RADIOLOGY  I personally reviewed all radiographic images ordered to evaluate for the above acute complaints and reviewed radiology reports and findings.  These findings were personally discussed with the patient.  Please see medical record for radiology report.  ____________________________________________   PROCEDURES  Procedure(s) performed:  Procedures    Critical Care performed: no ____________________________________________   INITIAL IMPRESSION / ASSESSMENT AND PLAN / ED COURSE  Pertinent labs & imaging results that were available during my care of the patient were reviewed by me and considered in my medical decision making (see chart for details).   DDX: Asthma, copd, CHF, pna, ptx, malignancy, Pe, anemia   [name] [name] is a 69 y.o. who presents to the ED with symptoms as described above.  Patient is tachypneic but satting well while at rest.  Chest x-ray with evidence of large pleural effusion.  Radiology called and recommended CT chest with contrast which I ordered.  CT chest shows evidence of pleural effusion with compressive atelectasis.  Have ordered thoracentesis.  Patient require admission for further medical management.     The patient was evaluated in Emergency Department today for the symptoms described in the history of present illness. He/she was evaluated in the context of the global COVID-19 pandemic, which necessitated consideration that the patient might be at risk for infection with the SARS-CoV-2 virus that causes COVID-19. Institutional protocols and algorithms that pertain to the evaluation of patients at risk for COVID-19 are in a state of rapid change based on information released by regulatory bodies including the CDC and federal and state organizations. These policies and algorithms were followed during the patient's care in the ED.  As part of my medical decision making, I reviewed the following  data within the [place] notes reviewed and incorporated, Labs reviewed, notes from prior ED visits and [place] Controlled Substance Database   ____________________________________________   FINAL CLINICAL IMPRESSION(S) / ED DIAGNOSES  Final diagnoses:  Pleural effusion  SOB (shortness of breath)      NEW MEDICATIONS STARTED DURING THIS VISIT:  New Prescriptions   No medications on file     Note:  This document was prepared using Dragon voice recognition software and may include unintentional dictation errors.    Merlyn Lot, MD 07/15/20 [phone]

## 2020-07-15 NOTE — ED Notes (Signed)
Attempted to call report x 1  

## 2020-07-15 NOTE — ED Notes (Signed)
Patient 86% on room air while sleeping. Patient placed on 2L [place] with O2 stats now at 93%.

## 2020-07-15 NOTE — ED Triage Notes (Signed)
Pt sent over from Dr. Marisue Humble office with c/o of increased SOB over the past 2-=3 weeks and there is concern for pleural effusion. Pt is in NAD on arrival..

## 2020-07-15 NOTE — H&P (Signed)
History and Physical   [name] [name] JYN:[id number] DOB: [birth year] DOA: 07/15/2020  PCP: Owens Loffler, MD  Patient coming from: Cardiology clinic  I have personally briefly reviewed patient's old medical records in [place].  Chief Concern: Worsening shortness of breath  HPI: [name] [name] is a 68 y.o. female with medical history significant for chronic heart failure reduced EF due to ischemic cardiomyopathy, hypertension, hyperlipidemia, arthritis, left DCIS breast cancer status postmastectomy, coronary artery disease status post DES PCI to the LAD in 04/2020, presented at the recommendation of her cardiologist.  She reports that she has been short of breath x3 weeks with exertion.  Better with rest.  And that has been worsening the last few days.  Her visit to the cardiologist was to address the shortness of breath.  She reports shortness of breath with exertion and dizziness with getting up for the last 2 days.  She denies palpitations.  She denies chest pain.  Remainder review systems negative for headache, vision changes, nausea, vomiting, fever, cough, chills, diaphoresis, abdominal pain, urinary complaints, and diarrhea.  Social history: She lives at home by herself but has a helper, denies tobacco use, denies alcohol use, denies recreational drug use  ED Course: Discussed with ED provider.  Admit patient for left thoracentesis.  Review of Systems: As per HPI otherwise 10 point review of systems negative.  Past Medical History:  Diagnosis Date  . Allergic rhinitis   . Avulsion fracture of distal fibula 3/10   and sprain; Left  . Breast cancer [facility]) 2004   Left Breast Cancer  . CHF (congestive heart failure) ([place])   . Coronary artery disease    a. 10/2012 ETT: nl; b. 12/2012 Cath: LM nl, LAD min irregs, D1 min irregs, LCX 30p, RCA 85m, RPDA 40p, EF 50%.  . Diastolic dysfunction    a. 12/2012 Echo: EF 50-55%. No rwma. Gr1 DD. Nl RV fxn. Nl PASP.  [name] Kitchen History of  hepatitis B    ?--tested + by [employer]  . HLD (hyperlipidemia)   . HTN (hypertension)   . Osteoarthritis   . Osteopenia    Past Surgical History:  Procedure Laterality Date  . ABDOMINAL HYSTERECTOMY  1985   endometriosis; anatomy  . BREAST SURGERY  10/2002  . CARDIAC CATHETERIZATION  12/19/12   [facility]; EF 50%  . COLONOSCOPY  8/09   Hyperplastic polyp; repeat in 10 years  . CORONARY STENT INTERVENTION N/A 05/03/2020   Procedure: CORONARY STENT INTERVENTION;  Surgeon: [name], MD;  Location: [place] [facility];  Service: Cardiovascular;  Laterality: N/A;  . HIP FRACTURE SURGERY  06/14/2011   ball replaced (Dr. Earvin Hansen)  . INTRAVASCULAR ULTRASOUND/IVUS N/A 05/03/2020   Procedure: Intravascular Ultrasound/IVUS;  Surgeon: [name], MD;  Location: [place] [facility];  Service: Cardiovascular;  Laterality: N/A;  . LEFT HEART CATH N/A 05/03/2020   Procedure: Left Heart Cath;  Surgeon: [name], MD;  Location: [place] [facility];  Service: Cardiovascular;  Laterality: N/A;  . MASTECTOMY Left 2004  . RIGHT/LEFT HEART CATH AND CORONARY ANGIOGRAPHY N/A 12/02/2019   Procedure: RIGHT/LEFT HEART CATH AND CORONARY ANGIOGRAPHY;  Surgeon: [name], MD;  Location: [place] [facility];  Service: Cardiovascular;  Laterality: N/A;  . TONSILLECTOMY  1958   Social History:  reports that she has quit smoking. Her smoking use included cigarettes. She has a 5.00 pack-year smoking history. She quit smokeless tobacco use about 34 years ago. She reports that she does not drink  alcohol and does not use drugs.  Allergies  Allergen Reactions  . Sulfur Rash  . Ace Inhibitors Cough  . Codeine Other (See Comments)    Reation:  Hallucinations   . Morphine Other (See Comments)    Reaction:  Hallucinations   . Sulfonamide Derivatives Rash   Family History  Problem Relation Age of Onset  . Heart attack Father 41  . Heart disease Father   . Sarcoidosis Mother   . Ovarian cancer Other         Aunt  . Diabetes Other        GP  . Breast cancer Sister 75  . Cancer Sister        breast  . Cancer Paternal Aunt        colon/ovarian   Family history: Family history reviewed and not pertinent heart attack in father at age 13.  Prior to Admission medications   Medication Sig Start Date End Date Taking? Authorizing Provider  aspirin EC 81 MG tablet Take 81 mg by mouth daily.    [provider]  Calcium Carbonate-Vitamin D (CALCIUM 600+D) 600-400 MG-UNIT tablet Take 1 tablet by mouth 2 (two) times daily.    [provider]  carvedilol (COREG) 6.25 MG tablet TAKE 1.5 TABLETS (9.375 MG TOTAL) BY MOUTH 2 (TWO) TIMES DAILY WITH A MEAL. 06/02/20 07/15/20  End, [place] Gave, MD  cetirizine (ZYRTEC) 10 MG tablet Take 10 mg by mouth at bedtime.     [provider]  clopidogrel (PLAVIX) 75 MG tablet Take 1 tablet (75 mg total) by mouth daily. 04/20/20   End, [place] Gave, MD  dapagliflozin propanediol (FARXIGA) 10 MG TABS tablet Take 1 tablet (10 mg total) by mouth daily. 05/04/20   [facility], Tami Lin, PA  ENTRESTO 49-51 MG TAKE 1 TABLET BY MOUTH TWICE A DAY 06/11/20   End, [place] Gave, MD  FLUoxetine (PROZAC) 20 MG capsule Take 1 capsule by mouth once daily 07/05/20   [place], Frederico Hamman, MD  fluticasone (FLONASE) 50 MCG/ACT nasal spray Place 1-2 sprays into both nostrils daily as needed for rhinitis. 01/10/18   Tower, Wynelle Fanny, MD  furosemide (LASIX) 40 MG tablet TAKE 1 TABLET BY MOUTH EVERY DAY 06/26/20   [place], Frederico Hamman, MD  GRAPE SEED EXTRACT PO Take 1 capsule by mouth 2 (two) times daily.     [provider]  Javier Docker Oil 1000 MG CAPS Take 1,000 mg by mouth at bedtime.    [provider]  meclizine (ANTIVERT) 25 MG tablet Take 0.5-1 tablets (12.5-25 mg total) by mouth 3 (three) times daily as needed for dizziness (sedation caution). 03/15/18   Tonia Ghent, MD  Misc Natural Products (OSTEO BI-FLEX ADV JOINT SHIELD) TABS Take 1 tablet by mouth 2 (two)  times daily.    [provider]  Multiple Vitamin (MULTIVITAMIN WITH MINERALS) TABS tablet Take 1 tablet by mouth daily.    [provider]  nystatin cream (MYCOSTATIN) Apply 1 application topically 2 (two) times daily. 03/18/18   Copland, Frederico Hamman, MD  rosuvastatin (CRESTOR) 5 MG tablet Take 2 tablets (10 mg total) by mouth daily. 05/04/20   Ledora Bottcher, PA  spironolactone (ALDACTONE) 25 MG tablet TAKE 1 TABLET BY MOUTH EVERY DAY 06/24/20   [place] Loffler, MD   Physical Exam: Vitals:   07/15/20 1101 07/15/20 1104 07/15/20 1215 07/15/20 1228  BP:   (!) 158/94 (!) 158/94  Pulse:   86 90  Resp:    (!) 22  Temp:  98 F (  36.7 C)    TempSrc:      SpO2:    94%  Weight: 84.8 kg     Height: 5\' 4"  (1.626 m)      Constitutional: NAD, calm, comfortable Eyes: PERRL, lids and conjunctivae normal ENMT: Mucous membranes are moist. Posterior pharynx clear of any exudate or lesions.Normal dentition.  Neck: normal, supple, no masses, no thyromegaly Respiratory: clear to auscultation bilaterally, no wheezing, no crackles. Normal respiratory effort. No accessory muscle use.  Cardiovascular: Regular rate and rhythm, no murmurs / rubs / gallops.  1+ pitting lower extremity edema. 2+ pedal pulses. No carotid bruits.  Abdomen: no tenderness, no masses palpated. No hepatosplenomegaly. Bowel sounds positive.  Musculoskeletal: Left mid back pain, no clubbing / cyanosis. No joint deformity upper and lower extremities. Good ROM, no contractures. Normal muscle tone.  Skin: no rashes, lesions, ulcers. No induration Neurologic: CN 2-12 grossly intact. Sensation intact. Strength 5/5 in all 4.  Psychiatric: Normal judgment and insight. Alert and oriented x 3. Normal mood.   Labs on Admission: I have personally reviewed following labs and imaging studies  CBC: Recent Labs  Lab 07/15/20 1108  WBC 15.8*  HGB 15.1*  HCT 47.1*  MCV 88.0  PLT 834*   Basic Metabolic Panel: Recent Labs    Lab 07/15/20 1108  NA 134*  K 4.1  CL 95*  CO2 27  GLUCOSE 122*  BUN 13  CREATININE 0.70  CALCIUM 9.6   Urine analysis:    Component Value Date/Time   COLORURINE AMBER (A) 05/03/2020 2011   APPEARANCEUR HAZY (A) 05/03/2020 2011   LABSPEC >1.046 (H) 05/03/2020 2011   PHURINE 5.0 05/03/2020 2011   GLUCOSEU NEGATIVE 05/03/2020 2011   HGBUR NEGATIVE 05/03/2020 2011   [place] NEGATIVE 05/03/2020 2011   [place] 05/03/2020 2011   PROTEINUR 30 (A) 05/03/2020 2011   UROBILINOGEN 1.0 06/18/2011 1244   NITRITE NEGATIVE 05/03/2020 2011   LEUKOCYTESUR SMALL (A) 05/03/2020 2011   Radiological Exams on Admission: Personally reviewed and I agree with radiologist reading as below.  DG Chest 2 View  Result Date: 07/15/2020 CLINICAL DATA:  Shortness of breath. Concern for possible pleural effusion. EXAM: CHEST - 2 VIEW COMPARISON:  Chest radiograph 05/03/2020. FINDINGS: Large left pleural effusion. Possible slight shift of the mediastinum to the right, although the patient is rotated. No focal consolidation. Possible 1.1 cm pulmonary nodule in the right midlung. No visible pneumothorax. Left axillary clips. No acute osseous abnormality. IMPRESSION: 1. Large left pleural effusion. 2. Possible 1.1 cm pulmonary nodule in the right midlung. Recommend CT of the chest with contrast to further evaluate. Findings and recommendations discussed with Dr. Quentin Cornwall at 11:53 a.m. via telephone. Electronically Signed   By: Margaretha Sheffield MD   On: 07/15/2020 11:56   CT Chest W Contrast  Result Date: 07/15/2020 CLINICAL DATA:  Increased shortness of breath over the past 2-3 weeks. Large left pleural effusion with slight shift of the mediastinum to the right on chest radiographs earlier today. There is also a possible 1.1 cm nodule in the right mid lung zone on the radiographs. EXAM: CT CHEST WITH CONTRAST TECHNIQUE: Multidetector CT imaging of the chest was performed during intravenous contrast  administration. CONTRAST:  91mL OMNIPAQUE IOHEXOL 300 MG/ML  SOLN COMPARISON:  10/05/2016. Chest radiographs obtained earlier today. FINDINGS: Cardiovascular: Atheromatous calcifications, including the coronary arteries and aorta. Stable enlarged heart. Interval small pericardial effusion with a maximum thickness of 5 mm. Enlarged central pulmonary arteries with a main pulmonary  artery transverse diameter of 3.5 cm. Minimally enlarged ascending thoracic aorta with a maximum diameter of 4.1 cm, unchanged. Mediastinum/Nodes: Increased mediastinal shift to the right since 10/05/2016, with some shift to the right present at that time. No enlarged mediastinal, hilar, or axillary lymph nodes. Thyroid gland, trachea, and esophagus demonstrate no significant findings. Lungs/Pleura: Moderate to large-sized left pleural effusion with a significant increase in amount. Associated compressive atelectasis of the left lung. Clear right lung. No pulmonary nodule in the right mid lung zone. No pleural fluid on the right. Upper Abdomen: Large gallstone in the gallbladder measuring 3.3 cm in maximum diameter. No gallbladder wall thickening or pericholecystic fluid. Unremarkable included portion of the liver. Musculoskeletal: Postmastectomy changes on the left. Thoracic spine degenerative changes. Mild chronic compression deformities of 4 thoracic vertebral bodies, without significant change. IMPRESSION: 1. Moderate to large-sized left pleural effusion with a significant increase in amount. 2. Associated compressive atelectasis of the left lung. 3. Interval small pericardial effusion. 4. Stable cardiomegaly. 5. Enlarged central pulmonary arteries, compatible with pulmonary arterial hypertension. 6. Stable minimally enlarged ascending thoracic aorta with a maximum diameter of 4.1 cm. Recommend annual imaging followup by CTA or MRA. This recommendation follows 2010 ACCF/AHA/AATS/ACR/ASA/SCA/SCAI/SIR/STS/SVM Guidelines for the Diagnosis  and Management of Patients with Thoracic Aortic Disease. Circulation. 2010; 121: Q229-N989. Aortic aneurysm NOS (ICD10-I71.9) 7. Cholelithiasis. 8. Calcific coronary artery and aortic atherosclerosis. Aortic Atherosclerosis (ICD10-I70.0). Electronically Signed   By: Claudie Revering M.D.   On: 07/15/2020 13:10   EKG: Independently reviewed, showing sinus rhythm with a rate of 93, LVH  Assessment/Plan  Active Problems:   Pleural effusion on left   Left pleural effusion -Insetting of history of breast cancer of the left breast, IR consulted for thoracentesis -Labs were obtained with cell counts, LDH, Gram stain, albumin, protein, glucose, HFrEF - Echo, EF 25-30%, G1DD - Entresto (transitioned from losartan to entresto in March 2021), farxiga, spironolactone, lasix, coreg, lipitor, asa - DAPT with asa and plavix for 6 months  -No CT evidence of heart strain -No elevated troponin -1.4 L serous fluid removed and sent for lab for analysis -Repeat chest x-ray was ordered after thoracentesis and negative for pneumothorax  CAD s/p DES PCI to mid/distal LAD-dual antiplatelet therapy with aspirin and Plavix  Essential hypertension-Entresto, Aldactone, Lasix, Coreg  HLD-rosuvastatin  B/L knee arthritis  H/o Left DCIS of the breast in 2006/2007 s/p left total mastectomy  - Breast prosthesis   H/o subungual hematoma of the right toe in 2015   DVT prophylaxis: Holding due to procedure, and on dual antiplatelet, resume tomorrow with heparin Code Status: Full code Diet: Heart healthy/carb modified Family Communication: Discussed with sister at bedside Disposition Plan: Pending clinical course Consults called: Interventional radiology Admission status: Observation with telemetry  Shenoa Hattabaugh N Tyqwan Pink D.O. [facility]  If 7AM-7PM, please contact day-coverage provider [email or web address]  07/15/2020, 2:37 PM

## 2020-07-16 ENCOUNTER — Observation Stay (HOSPITAL_COMMUNITY)
Admit: 2020-07-16 | Discharge: 2020-07-16 | Disposition: A | Discharge status: Home / Self Care | Payer: Medicare Other | Attending: Internal Medicine | Admitting: Internal Medicine

## 2020-07-16 DIAGNOSIS — Z853 Personal history of malignant neoplasm of breast: Secondary | ICD-10-CM | POA: Diagnosis not present

## 2020-07-16 DIAGNOSIS — E871 Hypo-osmolality and hyponatremia: Secondary | ICD-10-CM | POA: Diagnosis not present

## 2020-07-16 DIAGNOSIS — I712 Thoracic aortic aneurysm, without rupture, unspecified: Secondary | ICD-10-CM

## 2020-07-16 DIAGNOSIS — Z7982 Long term (current) use of aspirin: Secondary | ICD-10-CM | POA: Diagnosis not present

## 2020-07-16 DIAGNOSIS — I255 Ischemic cardiomyopathy: Secondary | ICD-10-CM | POA: Diagnosis not present

## 2020-07-16 DIAGNOSIS — Z803 Family history of malignant neoplasm of breast: Secondary | ICD-10-CM | POA: Diagnosis not present

## 2020-07-16 DIAGNOSIS — M1712 Unilateral primary osteoarthritis, left knee: Secondary | ICD-10-CM | POA: Diagnosis present

## 2020-07-16 DIAGNOSIS — I2510 Atherosclerotic heart disease of native coronary artery without angina pectoris: Secondary | ICD-10-CM | POA: Diagnosis present

## 2020-07-16 DIAGNOSIS — Z20822 Contact with and (suspected) exposure to covid-19: Secondary | ICD-10-CM | POA: Diagnosis present

## 2020-07-16 DIAGNOSIS — J90 Pleural effusion, not elsewhere classified: Secondary | ICD-10-CM | POA: Diagnosis not present

## 2020-07-16 DIAGNOSIS — I5043 Acute on chronic combined systolic (congestive) and diastolic (congestive) heart failure: Secondary | ICD-10-CM | POA: Diagnosis present

## 2020-07-16 DIAGNOSIS — Z882 Allergy status to sulfonamides status: Secondary | ICD-10-CM | POA: Diagnosis not present

## 2020-07-16 DIAGNOSIS — Z955 Presence of coronary angioplasty implant and graft: Secondary | ICD-10-CM | POA: Diagnosis not present

## 2020-07-16 DIAGNOSIS — I5023 Acute on chronic systolic (congestive) heart failure: Secondary | ICD-10-CM | POA: Diagnosis not present

## 2020-07-16 DIAGNOSIS — Z7902 Long term (current) use of antithrombotics/antiplatelets: Secondary | ICD-10-CM | POA: Diagnosis not present

## 2020-07-16 DIAGNOSIS — J9601 Acute respiratory failure with hypoxia: Secondary | ICD-10-CM

## 2020-07-16 DIAGNOSIS — E785 Hyperlipidemia, unspecified: Secondary | ICD-10-CM | POA: Diagnosis present

## 2020-07-16 DIAGNOSIS — J309 Allergic rhinitis, unspecified: Secondary | ICD-10-CM | POA: Diagnosis present

## 2020-07-16 DIAGNOSIS — Z90710 Acquired absence of both cervix and uterus: Secondary | ICD-10-CM | POA: Diagnosis not present

## 2020-07-16 DIAGNOSIS — Z9012 Acquired absence of left breast and nipple: Secondary | ICD-10-CM | POA: Diagnosis not present

## 2020-07-16 DIAGNOSIS — Z23 Encounter for immunization: Secondary | ICD-10-CM | POA: Diagnosis not present

## 2020-07-16 DIAGNOSIS — R0902 Hypoxemia: Secondary | ICD-10-CM | POA: Diagnosis present

## 2020-07-16 DIAGNOSIS — M8580 Other specified disorders of bone density and structure, unspecified site: Secondary | ICD-10-CM | POA: Diagnosis present

## 2020-07-16 DIAGNOSIS — D75838 Other thrombocytosis: Secondary | ICD-10-CM | POA: Diagnosis not present

## 2020-07-16 DIAGNOSIS — E222 Syndrome of inappropriate secretion of antidiuretic hormone: Secondary | ICD-10-CM | POA: Diagnosis present

## 2020-07-16 DIAGNOSIS — R0602 Shortness of breath: Secondary | ICD-10-CM | POA: Diagnosis not present

## 2020-07-16 DIAGNOSIS — Z79899 Other long term (current) drug therapy: Secondary | ICD-10-CM | POA: Diagnosis not present

## 2020-07-16 DIAGNOSIS — I110 Hypertensive heart disease with heart failure: Secondary | ICD-10-CM | POA: Diagnosis present

## 2020-07-16 LAB — CBC
HCT: 44.4 % (ref 36.0–46.0)
Hemoglobin: 14 g/dL (ref 12.0–15.0)
MCH: 27.9 pg (ref 26.0–34.0)
MCHC: 31.5 g/dL (ref 30.0–36.0)
MCV: 88.4 fL (ref 80.0–100.0)
Platelets: 407 10*3/uL — ABNORMAL HIGH (ref 150–400)
RBC: 5.02 MIL/uL (ref 3.87–5.11)
RDW: 14.5 % (ref 11.5–15.5)
WBC: 15.7 10*3/uL — ABNORMAL HIGH (ref 4.0–10.5)
nRBC: 0 % (ref 0.0–0.2)

## 2020-07-16 LAB — LACTATE DEHYDROGENASE: LDH: 127 U/L (ref 98–192)

## 2020-07-16 LAB — ECHOCARDIOGRAM COMPLETE
AR max vel: 1.68 cm2
AV Area VTI: 1.6 cm2
AV Area mean vel: 1.6 cm2
AV Mean grad: 6 mmHg
AV Peak grad: 10.5 mmHg
Ao pk vel: 1.62 m/s
Area-P 1/2: 4.15 cm2
Height: 64 in
S' Lateral: 4.34 cm
Weight: 2966.51 oz

## 2020-07-16 LAB — BASIC METABOLIC PANEL
Anion gap: 13 (ref 5–15)
BUN: 16 mg/dL (ref 8–23)
CO2: 26 mmol/L (ref 22–32)
Calcium: 9.2 mg/dL (ref 8.9–10.3)
Chloride: 94 mmol/L — ABNORMAL LOW (ref 98–111)
Creatinine, Ser: 0.78 mg/dL (ref 0.44–1.00)
GFR, Estimated: >60 mL/min (ref >60)
Glucose, Bld: 104 mg/dL — ABNORMAL HIGH (ref 70–99)
Potassium: 3.6 mmol/L (ref 3.5–5.1)
Sodium: 133 mmol/L — ABNORMAL LOW (ref 135–145)

## 2020-07-16 LAB — PROCALCITONIN: Procalcitonin: <0.1 ng/mL

## 2020-07-16 LAB — ACID FAST SMEAR (AFB, MYCOBACTERIA): Acid Fast Smear: NEGATIVE

## 2020-07-16 LAB — PATHOLOGIST SMEAR REVIEW

## 2020-07-16 MED ORDER — ASPIRIN EC 325 MG PO TBEC
325.0000 mg | DELAYED_RELEASE_TABLET | Freq: Every day | ORAL | Status: DC
  Administered 2020-07-16 – 2020-07-17 (×2): 325 mg via ORAL
  Filled 2020-07-16 (×2): qty 1

## 2020-07-16 MED ORDER — ACETAMINOPHEN 500 MG PO TABS
1000.0000 mg | ORAL_TABLET | Freq: Four times a day (QID) | ORAL | Status: DC | PRN
  Administered 2020-07-16 (×2): 1000 mg via ORAL
  Filled 2020-07-16 (×3): qty 2

## 2020-07-16 MED ORDER — HEPARIN SODIUM (PORCINE) 5000 UNIT/ML IJ SOLN
5000.0000 [IU] | Freq: Three times a day (TID) | INTRAMUSCULAR | Status: DC
  Administered 2020-07-17: 5000 [IU] via SUBCUTANEOUS
  Filled 2020-07-16: qty 1

## 2020-07-16 MED ORDER — FUROSEMIDE 10 MG/ML IJ SOLN
40.0000 mg | Freq: Once | INTRAMUSCULAR | Status: AC
  Administered 2020-07-16: 40 mg via INTRAVENOUS
  Filled 2020-07-16: qty 4

## 2020-07-16 MED ORDER — PERFLUTREN LIPID MICROSPHERE
1.0000 mL | INTRAVENOUS | Status: AC | PRN
  Administered 2020-07-16: 2 mL via INTRAVENOUS
  Filled 2020-07-16: qty 10

## 2020-07-16 MED ORDER — INFLUENZA VAC A&B SA ADJ QUAD 0.5 ML IM PRSY
0.5000 mL | PREFILLED_SYRINGE | INTRAMUSCULAR | Status: AC
  Administered 2020-07-17: 0.5 mL via INTRAMUSCULAR
  Filled 2020-07-16: qty 0.5

## 2020-07-16 MED ORDER — ACETAMINOPHEN 500 MG PO TABS
ORAL_TABLET | ORAL | Status: AC
  Administered 2020-07-16: 01:00:00 1000 mg via ORAL
  Filled 2020-07-16: qty 2

## 2020-07-16 NOTE — Progress Notes (Signed)
*  PRELIMINARY RESULTS* Echocardiogram 2D Echocardiogram has been performed.  [name] [name] 07/16/2020, 12:44 PM

## 2020-07-16 NOTE — Consult Note (Signed)
Cardiology Consultation:   Patient ID: [name] [name]; [id number]; [birth year]   Admit date: 07/15/2020 Date of Consult: 07/16/2020  Primary Care Provider: Owens Loffler, MD Primary Cardiologist: End Primary Electrophysiologist:  None   Patient Profile:   [name] [name] is a 69 y.o. female with a hx of CAD status post PCI to the LAD in 04/2020, HFrEF secondary to ICM, breast cancer status post surgery without chemoradiation in 2004, HTN, and HLD who is being seen today for the evaluation of left pleural effusion at the request of Dr. Tobie [name].  History of Present Illness:   [name] [name] was admitted to the hospital in 11/2019 with exertional dyspnea and newly noted cardiomyopathy with an LVEF of 25 to 30% by echo.  Following diuresis and GDMT she underwent diagnostic cardiac cath on 12/02/2019 which showed significant two-vessel CAD with diffuse LAD disease up to 70 to 80% in the mid/distal vessel and chronic subtotal occlusion of the mid RCA with left-to-right collaterals.  There were moderately elevated left heart, right heart, and pulmonary artery pressures with low normal cardiac output/index.  Medical management of her CAD was recommended secondary to small vessel size and diffuse disease involving the LAD and RCA making PCI and surgical revascularization suboptimal.  Follow-up echo on GDMT on 03/01/2020 showed persistent cardiomyopathy with an EF of 25 to 30% and grade 2 diastolic dysfunction.  Subsequent cardiac MRI on 04/13/2020 showed viability in the LAD territory with an LVEF of 33%.  In this setting she underwent repeat cardiac cath on 05/03/2020 which showed severe two-vessel CAD with diffuse LAD disease up to 7080% that was significant by IVUS as well as CTO of the RCA.  She underwent successful IVUS guided PCI to the mid/distal LAD using overlapping DES.  Following this she was seen in the ED in 05/2020 after developing severe pain in her shoulders and arms which was ultimately felt to be  related to muscle spasm.  Work-up in the ED at that time was notable for persistent leukocytosis as well as mild thrombocytosis.  High-sensitivity troponin was negative x1.  EKG was not performed.  Following this, she has continued to note of exertional dyspnea.  She was seen in our office on 07/15/2020 noting marked worsening of her shortness of breath without edema or weight gain.  There was no orthopnea.  Bedside echo was obtained which showed a severely reduced LVEF similar to prior studies.  There was also evidence of a large left pleural effusion which was suspected to be the primary driving force of her dyspnea.  There was concern for potential malignant process or infection given her worsening leukocytosis over the past several months.  In this setting, she was sent to the ED for further evaluation.    EKG showed NSR, 93 bpm, prior anterior infarct, and nonspecific ST-T changes.  Chest x-ray obtained in the ED showed a large left pleural effusion with a possible 1.1 cm pulmonary nodule in the right midlung.  CT chest without contrast (non-CTA) showed a moderate to large-sized left pleural effusion, interval small pericardial effusion, stable cardiomegaly, enlarged central pulmonary arteries compatible with pulmonary arterial hypertension, stable minimally enlarged a sending thoracic aorta with a maximal diameter 4.1 cm.  She underwent successful ultrasound-guided thoracentesis with 1.4 L of serous fluid removed.  Follow-up chest x-ray demonstrated interval reduction to near resolution of left-sided pleural effusion with no evidence of pneumothorax.  Labs showed high-sensitivity troponin negative x2, Covid negative, persistent leukocytosis with a WBC count  of 15.8, Hgb 15.1, PLT 451, potassium 4.1, BUN 13, serum creatinine 0.7, TSH normal.  Pleural fluid studies: No organisms seen on Gram stain, albumin 3.5, total protein 5.4, glucose 111, LDH 113, and pathology review pending.  Light's criteria met  concerning for exudative effusion.  This morning since undergoing thoracentesis she reports her breathing is back to baseline.  She does remain on supplemental oxygen via nasal cannula at 2 L.  Outside of some left-sided flank/chest pain she is without complaints.  She attributes this to having to lean over a metal table as this has happened to her in the past.  She denies any lower extremity swelling, abdominal distention, orthopnea, PND, or early satiety.  She does feel like her appetite has decreased somewhat though.    Past Medical History:  Diagnosis Date  . Allergic rhinitis   . Avulsion fracture of distal fibula 3/10   and sprain; Left  . Breast cancer [facility]) 2004   Left Breast Cancer  . CHF (congestive heart failure) ([place])   . Coronary artery disease    a. 10/2012 ETT: nl; b. 12/2012 Cath: LM nl, LAD min irregs, D1 min irregs, LCX 30p, RCA 51m RPDA 40p, EF 50%.  . Diastolic dysfunction    a. 12/2012 Echo: EF 50-55%. No rwma. Gr1 DD. Nl RV fxn. Nl PASP.  .[name] KitchenHistory of hepatitis B    ?--tested + by [facility] . HLD (hyperlipidemia)   . HTN (hypertension)   . Osteoarthritis   . Osteopenia     Past Surgical History:  Procedure Laterality Date  . ABDOMINAL HYSTERECTOMY  1985   endometriosis; anatomy  . BREAST SURGERY  10/2002  . CARDIAC CATHETERIZATION  12/19/12   [facility]; EF 50%  . COLONOSCOPY  8/09   Hyperplastic polyp; repeat in 10 years  . CORONARY STENT INTERVENTION N/A 05/03/2020   Procedure: CORONARY STENT INTERVENTION;  Surgeon: ENelva Bush MD;  [facility];  Service: Cardiovascular;  Laterality: N/A;  . HIP FRACTURE SURGERY  06/14/2011   ball replaced (Dr. REarvin Hansen  . INTRAVASCULAR ULTRASOUND/IVUS N/A 05/03/2020   Procedure: Intravascular Ultrasound/IVUS;  Surgeon: ENelva Bush MD;  [facility];  Service: Cardiovascular;  Laterality: N/A;  . LEFT HEART CATH N/A 05/03/2020   Procedure: Left Heart Cath;  Surgeon: ENelva Bush  MD;  [facility];  Service: Cardiovascular;  Laterality: N/A;  . MASTECTOMY Left 2004  . RIGHT/LEFT HEART CATH AND CORONARY ANGIOGRAPHY N/A 12/02/2019   Procedure: RIGHT/LEFT HEART CATH AND CORONARY ANGIOGRAPHY;  Surgeon: ENelva Bush MD;  [place];  Service: Cardiovascular;  Laterality: N/A;  . TONSILLECTOMY  1958     Home Meds: Prior to Admission medications   Medication Sig Start Date End Date Taking? Authorizing Provider  aspirin EC 81 MG tablet Take 81 mg by mouth daily.   Yes [provider]  Calcium Carbonate-Vitamin D (CALCIUM 600+D) 600-400 MG-UNIT tablet Take 1 tablet by mouth 2 (two) times daily.   Yes [provider]  carvedilol (COREG) 6.25 MG tablet TAKE 1.5 TABLETS (9.375 MG TOTAL) BY MOUTH 2 (TWO) TIMES DAILY WITH A MEAL. 06/02/20 07/15/20 Yes End, CHarrell Gave MD  cetirizine (ZYRTEC) 10 MG tablet Take 10 mg by mouth at bedtime.    Yes [provider]  clopidogrel (PLAVIX) 75 MG tablet Take 1 tablet (75 mg total) by mouth daily. 04/20/20  Yes End, CHarrell Gave MD  dapagliflozin propanediol (FARXIGA) 10 MG TABS tablet Take 1  tablet (10 mg total) by mouth daily. 05/04/20  Yes Duke, Tami Lin, PA  ENTRESTO 49-51 MG TAKE 1 TABLET BY MOUTH TWICE A DAY 06/11/20  Yes End, Harrell Gave, MD  FLUoxetine (PROZAC) 20 MG capsule Take 1 capsule by mouth once daily Patient taking differently: Take 20 mg by mouth daily.  07/05/20  Yes Copland, Frederico Hamman, MD  furosemide (LASIX) 40 MG tablet TAKE 1 TABLET BY MOUTH EVERY DAY Patient taking differently: Take 40 mg by mouth daily.  06/26/20  Yes Copland, Frederico Hamman, MD  GRAPE SEED EXTRACT PO Take 1 capsule by mouth 2 (two) times daily.    Yes [provider]  Misc Natural Products (OSTEO BI-FLEX ADV JOINT SHIELD) TABS Take 1 tablet by mouth 2 (two) times daily.   Yes [provider]  Multiple Vitamin (MULTIVITAMIN WITH MINERALS) TABS tablet Take 1 tablet by mouth  daily.   Yes [provider]  rosuvastatin (CRESTOR) 5 MG tablet Take 2 tablets (10 mg total) by mouth daily. 05/04/20  Yes Duke, Tami Lin, PA  spironolactone (ALDACTONE) 25 MG tablet TAKE 1 TABLET BY MOUTH EVERY DAY Patient taking differently: Take 25 mg by mouth daily.  06/24/20  Yes Copland, Frederico Hamman, MD  fluticasone (FLONASE) 50 MCG/ACT nasal spray Place 1-2 sprays into both nostrils daily as needed for rhinitis. Patient not taking: Reported on 07/15/2020 01/10/18   Tower, Wynelle Fanny, MD  Krill Oil 1000 MG CAPS Take 1,000 mg by mouth at bedtime. Patient not taking: Reported on 07/15/2020    [provider]  meclizine (ANTIVERT) 25 MG tablet Take 0.5-1 tablets (12.5-25 mg total) by mouth 3 (three) times daily as needed for dizziness (sedation caution). Patient not taking: Reported on 07/15/2020 03/15/18   Tonia Ghent, MD  nystatin cream (MYCOSTATIN) Apply 1 application topically 2 (two) times daily. Patient not taking: Reported on 07/15/2020 03/18/18   [name] Loffler, MD    Inpatient Medications: Scheduled Meds: . aspirin EC  325 mg Oral Daily  . carvedilol  9.375 mg Oral BID WC  . clopidogrel  75 mg Oral Daily  . dapagliflozin propanediol  10 mg Oral Daily  . FLUoxetine  20 mg Oral Daily  . furosemide  40 mg Oral Daily  . [START ON 07/17/2020] heparin injection (subcutaneous)  5,000 Units Subcutaneous Q8H  . [START ON 07/17/2020] influenza vaccine adjuvanted  0.5 mL Intramuscular Tomorrow-1000  . rosuvastatin  10 mg Oral Daily  . sacubitril-valsartan  1 tablet Oral BID  . spironolactone  25 mg Oral Daily   Continuous Infusions:  PRN Meds: acetaminophen  Allergies:   Allergies  Allergen Reactions  . Codeine Other (See Comments)    Reation:  Hallucinations   . Morphine Other (See Comments)    Reaction:  Hallucinations   . Sulfur Rash  . Ace Inhibitors Cough  . Sulfonamide Derivatives Rash    Social History:   Social History   Socioeconomic History  .  Marital status: Widowed    Spouse name: (Widow) to [place]  . Number of children: Not on file  . Years of education: Not on file  . Highest education level: Not on file  Occupational History  . Not on file  Tobacco Use  . Smoking status: Former Smoker    Packs/day: 0.50    Years: 10.00    Pack years: 5.00    Types: Cigarettes  . Smokeless tobacco: Former Systems developer    Quit date: 01/24/1986  . Tobacco comment: 1989 quit   Vaping Use  .  Vaping Use: Never used  Substance and Sexual Activity  . Alcohol use: No  . Drug use: No  . Sexual activity: Not on file  Other Topics Concern  . Not on file  Social History Narrative   Widowed to husband Renato Gails      No regular exercise   Lives locally w/ tenants that help her w/ chores around the house.   Social Determinants of Health   Financial Resource Strain:   . Difficulty of Paying Living Expenses: Not on file  Food Insecurity:   . Worried About Charity fundraiser in the Last Year: Not on file  . Ran Out of Food in the Last Year: Not on file  Transportation Needs:   . Lack of Transportation (Medical): Not on file  . Lack of Transportation (Non-Medical): Not on file  Physical Activity:   . Days of Exercise per Week: Not on file  . Minutes of Exercise per Session: Not on file  Stress:   . Feeling of Stress : Not on file  Social Connections:   . Frequency of Communication with Friends and Family: Not on file  . Frequency of Social Gatherings with Friends and Family: Not on file  . Attends Religious Services: Not on file  . Active Member of Clubs or Organizations: Not on file  . Attends Archivist Meetings: Not on file  . Marital Status: Not on file  Intimate Partner Violence:   . Fear of Current or Ex-Partner: Not on file  . Emotionally Abused: Not on file  . Physically Abused: Not on file  . Sexually Abused: Not on file     Family History:   Family History  Problem Relation Age of Onset  . Heart attack  Father 29  . Heart disease Father   . Sarcoidosis Mother   . Ovarian cancer Other        Aunt  . Diabetes Other        GP  . Breast cancer Sister 83  . Cancer Sister        breast  . Cancer Paternal Aunt        colon/ovarian    ROS:  Review of Systems  Constitutional: Positive for malaise/fatigue and weight loss. Negative for chills, diaphoresis and fever.  HENT: Negative for congestion.   Eyes: Negative for discharge and redness.  Respiratory: Positive for shortness of breath. Negative for cough, sputum production and wheezing.   Cardiovascular: Negative for chest pain, palpitations, orthopnea, claudication, leg swelling and PND.  Gastrointestinal: Negative for abdominal pain, blood in stool, heartburn, melena, nausea and vomiting.  Musculoskeletal: Negative for falls and myalgias.  Skin: Negative for rash.  Neurological: Positive for weakness. Negative for dizziness, tingling, tremors, sensory change, speech change, focal weakness and loss of consciousness.  Endo/Heme/Allergies: Does not bruise/bleed easily.  Psychiatric/Behavioral: Negative for substance abuse. The patient is not nervous/anxious.   All other systems reviewed and are negative.     Physical Exam/Data:   Vitals:   07/15/20 2330 07/16/20 0031 07/16/20 0412 07/16/20 0731  BP: (!) 132/59 123/63 112/63 124/61  Pulse: 94 90 71 72  Resp: (!) _0 Temp:  98.4 F (36.9 C) 98.6 F (37 C) 97.8 F (36.6 C)  TempSrc:  Oral Oral Oral  SpO2: 91% 92% 100% 100%  Weight:  84.1 kg    Height:  _1  (1.626 m)     No intake or output data in the  24 hours ending 07/16/20 0735 Filed Weights   07/15/20 1101 07/16/20 0031  Weight: 84.8 kg 84.1 kg   Body mass index is 31.83 kg/m.   Physical Exam: General: Well developed, well nourished, in no acute distress. Head: Normocephalic, atraumatic, sclera non-icteric, no xanthomas, nares without discharge.  Neck: Negative for carotid bruits. JVD not elevated. Lungs:  Clear bilaterally to auscultation without wheezes, rales, or rhonchi. Breathing is unlabored.  Supplemental oxygen noted at 2 L via nasal cannula. Heart: RRR with S1 S2. No murmurs, rubs, or gallops appreciated. Abdomen: Soft, non-tender, non-distended with normoactive bowel sounds. No hepatomegaly. No rebound/guarding. No obvious abdominal masses. Msk:  Strength and tone appear normal for age. Extremities: No clubbing or cyanosis.  Trace bilateral pretibial edema. Distal pedal pulses are 2+ and equal bilaterally. Neuro: Alert and oriented X 3. No facial asymmetry. No focal deficit. Moves all extremities spontaneously. Psych:  Responds to questions appropriately with a normal affect.   EKG:  The EKG was personally reviewed and demonstrates: NSR, 93 bpm, prior anterior infarct, and nonspecific ST-T changes Telemetry:  Telemetry was personally reviewed and demonstrates: SR  Weights: Filed Weights   07/15/20 1101 07/16/20 0031  Weight: 84.8 kg 84.1 kg    Relevant CV Studies:  2D echo pending __________  [facility] 05/03/2020: Conclusions: 1. Severe two-vessel coronary artery disease with diffuse LAD disease of up to 70-80% that is significant by IVUS, as well as CTO of RCA. 2. Upper normal LVEDP. 3. Successful IVUS-guided PCI to mid/distal LAD using overlapping Resolute Onyx 2.5 x 38 mm and 2.25 x 34 mm drug-eluting stents with 0% residual stenosis and TIMI-3 flow.  Recommendations: 1. Overnight extended recovery given multivessel CAD and LVEF < 35%. 2. Obtain CXR and UA/urine culture for further evaluation of leukocytosis. 3. Dual antiplatelet therapy with ASA and clopidogrel for at lest 6 months, ideally longer. 4. Recheck lipid panel in AM and escalate statin therapy, as tolerated, if LDL > 70. __________  Cardiac MRI 04/17/2020: 1. Severe left ventricular enlargement with severely reduced LV function, LVEF 33%. Akinesis of inferior and inferolateral walls.  2. Post-contrast delayed  myocardial enhancement in the inferior and inferolateral walls, with greater than 50% of the myocardial thickness involved suggesting no viability in the RCA territory.  3.  Normal right ventricular size and systolic function. __________  2D echo 03/01/2020: 1. Left ventricular ejection fraction, by estimation, is 25 to 30%. The  left ventricle has severely decreased function. The left ventricle  demonstrates global hypokinesis. The left ventricular internal cavity size  was mildly dilated. Left ventricular  diastolic parameters are consistent with Grade II diastolic dysfunction  (pseudonormalization).  2. Right ventricular systolic function is normal. The right ventricular  size is normal. Tricuspid regurgitation signal is inadequate for assessing  PA pressure.  3. Left atrial size was mildly dilated.  4. The mitral valve is normal in structure. Mild mitral valve  regurgitation. No evidence of mitral stenosis.  5. The aortic valve is normal in structure. Aortic valve regurgitation is  not visualized. Mild to moderate aortic valve sclerosis/calcification is  present, without any evidence of aortic stenosis. __________  [facility] 12/02/2019: Conclusions: 1. Significant two-vessel coronary artery disease with diffuse LAD disease of up to 70-80% in the mid/distal vessel and chronic subtotal occlusion of the mid RCA with left-to-right collaterals. 2. Moderately elevated left heart, right heart, and pulmonary artery pressures. 3. Low normal Fick cardiac output/index.  Recommendations: 1. Escalate diuresis and optimize evidence-based heart failure therapy. 2.  Medical management of coronary artery disease. Small vessel size and diffuse disease involving LAD and RCA make percutaneous and surgical revascularization suboptimal. __________  2D echo 11/29/2019: 1. Left ventricular ejection fraction, by estimation, is 25 to 30%. The  left ventricle has severely decreased function. The left  ventricle  demonstrates global hypokinesis. The left ventricular internal cavity size  was mildly dilated. There is mild left  ventricular hypertrophy. Left ventricular diastolic parameters are  consistent with Grade I diastolic dysfunction (impaired relaxation).  2. Right ventricular systolic function is normal. The right ventricular  size is normal. Tricuspid regurgitation signal is inadequate for assessing  PA pressure.  3. Left atrial size was mildly dilated.  4. The inferior vena cava is normal in size with greater than 50%  respiratory variability, suggesting right atrial pressure of 3 mmHg.   Laboratory Data:  Chemistry Recent Labs  Lab 07/15/20 1108 07/16/20 0438  NA 134* 133*  K 4.1 3.6  CL 95* 94*  CO2 27 26  GLUCOSE 122* 104*  BUN 13 16  CREATININE 0.70 0.78  CALCIUM 9.6 9.2  GFRNONAA >60 >60  ANIONGAP 12 13    No results for input(s): PROT, ALBUMIN, AST, ALT, ALKPHOS, BILITOT in the last 168 hours. Hematology Recent Labs  Lab 07/15/20 1108 07/16/20 0438  WBC 15.8* 15.7*  RBC 5.35* 5.02  HGB 15.1* 14.0  HCT 47.1* 44.4  MCV 88.0 88.4  MCH 28.2 27.9  MCHC 32.1 31.5  RDW 14.5 14.5  PLT 451* 407*   Cardiac EnzymesNo results for input(s): TROPONINI in the last 168 hours. No results for input(s): TROPIPOC in the last 168 hours.  BNPNo results for input(s): BNP, PROBNP in the last 168 hours.  DDimer No results for input(s): DDIMER in the last 168 hours.  Radiology/Studies:  DG Chest 2 View  Result Date: 07/15/2020 IMPRESSION: 1. Large left pleural effusion. 2. Possible 1.1 cm pulmonary nodule in the right midlung. Recommend CT of the chest with contrast to further evaluate. Findings and recommendations discussed with Dr. Quentin Cornwall at 11:53 a.m. via telephone. Electronically Signed   By: Margaretha Sheffield MD   On: 07/15/2020 11:56   CT Chest W Contrast  Result Date: 07/15/2020 IMPRESSION: 1. Moderate to large-sized left pleural effusion with a  significant increase in amount. 2. Associated compressive atelectasis of the left lung. 3. Interval small pericardial effusion. 4. Stable cardiomegaly. 5. Enlarged central pulmonary arteries, compatible with pulmonary arterial hypertension. 6. Stable minimally enlarged ascending thoracic aorta with a maximum diameter of 4.1 cm. Recommend annual imaging followup by CTA or MRA. This recommendation follows 2010 ACCF/AHA/AATS/ACR/ASA/SCA/SCAI/SIR/STS/SVM Guidelines for the Diagnosis and Management of Patients with Thoracic Aortic Disease. Circulation. 2010; 121: P295-J884. Aortic aneurysm NOS (ICD10-I71.9) 7. Cholelithiasis. 8. Calcific coronary artery and aortic atherosclerosis. Aortic Atherosclerosis (ICD10-I70.0). Electronically Signed   By: Claudie Revering M.D.   On: 07/15/2020 13:10   DG Chest Port 1 View  Result Date: 07/15/2020 IMPRESSION: 1. Interval reduction/near resolution of left-sided pleural effusion post thoracentesis. No pneumothorax. 2. Pulmonary venous congestion without frank evidence of edema. Electronically Signed   By: Sandi Mariscal M.D.   On: 07/15/2020 16:15   US THORACENTESIS ASP PLEURAL SPACE W/IMG GUIDE  Result Date: 07/15/2020 IMPRESSION: Successful ultrasound-guided left sided thoracentesis yielding 1.4 liters of pleural fluid. Electronically Signed   By: Sandi Mariscal M.D.   On: 07/15/2020 16:16    Assessment and Plan:   1.  Acute hypoxic respiratory distress with large left pleural effusion: -Symptoms much  improved status post ultrasound-guided thoracentesis of 1.4 L of pleural fluid removed with Light's criteria met concerning for exudative effusion -Cultures pending -Await cytology -Wean oxygen as tolerated -Await echo -She has already received contrast for CT chest in the ED, defer CTA chest to internal medicine  2.  CAD involving the native coronary arteries: -No symptoms of angina with dyspnea resolved following thoracentesis -High-sensitivity troponin negative  x2 -Echo pending -Continue current medical therapy including dual antiplatelet therapy with aspirin and Plavix without interruption as well as carvedilol and Crestor -At this time, given the above likely exudative effusion and in the context of negative troponins no plans for inpatient ischemic evaluation  3.  HFrEF secondary to ICM: -Overall she appears grossly euvolemic and well compensated -Suspect her progressive dyspnea was in the context of worsening left-sided pleural effusion -We will give one-time dose of IV Lasix 40 mg this afternoon -Continue GDMT including carvedilol, Entresto, spironolactone, and Farxiga -Daily weights -Strict I's and O's -Echo pending -As an outpatient, consider referral to advanced heart failure clinic in [place]  4.  Leukocytosis/thrombocytosis: -Management per internal medicine  5. HTN: -Blood pressure well controlled -Continue current medical therapy as outlined above  6. HLD:  -LDL of 48 from 04/2020 -Crestor   For questions or updates, please contact [place] Please consult [email or web address] for contact info under Cardiology/STEMI.   Signed, Christell Faith, PA-C [facility] Pager: [phone] 07/16/2020, 7:35 AM

## 2020-07-16 NOTE — Progress Notes (Signed)
PROGRESS NOTE    [name] [name]  FXT:[id number] DOB: 1950/09/19 DOA: 07/15/2020 PCP: Owens Loffler, MD   Chief complaint.  Shortness of breath Brief Narrative:  [name] [name] is a 69 y.o. female with medical history significant for chronic heart failure reduced EF due to ischemic cardiomyopathy, hypertension, hyperlipidemia, arthritis, left DCIS breast cancer status postmastectomy, coronary artery disease status post DES PCI to the LAD in 04/2020, presented at the recommendation of her cardiologist.  Shortness of breath started about 3 weeks ago, worse with exertion.  CT scan showed fairly large left-sided pleural effusion, thoracentesis was performed on 11/4.  Removed 1.4 L of fluid.  Initial study suggested exudates, cultures pending.  Procalcitonin level negative. Patient was also given IV Lasix by cardiology for exacerbation congestive heart failure.   Assessment & Plan:   Active Problems:   Pleural effusion on left   Acute on chronic systolic (congestive) heart failure (HCC)  #1.  Left-sided pleural effusion. Status post thoracentesis.  Initial lab work showed exudates.  Pending culture results.  However, procalcitonin level negative.  I will hold off antibiotics until culture results available.  Most likely is not infectious.  2.  Acute on chronic systolic congestive heart failure with ejection fraction 25 to 30%. Patient was given IV Lasix by cardiology today. Patient is on maximal treatment with Entresto, Aldactone, Coreg and farxiga. Monitor renal function and electrolytes.  3.  Essential hypertension. Continue current treatment.  4.  Dyspnea with exertion. Secondary to pleural effusion and acute on chronic systolic congestive heart failure.  Patient short of breath much improved after thoracentesis.  I do not have suspicion for PE.  She already had a CT scan with contrast yesterday, I would not recommend another CT angiogram of the chest.  5.  Aortic  aneurysm. Follow-up with PCP and cardiology as outpatient.  6.  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 Recheck a BMP tomorrow after Lasix per  7.  Leukocytosis and thrombocytosis. No clear infection.  Follow.       DVT prophylaxis: SQ heparin Code Status: Full Family Communication: None Disposition Plan:  .   Status is: Inpatient  Remains inpatient appropriate because:Inpatient level of care appropriate due to severity of illness   Dispo: The patient is from: Home              Anticipated d/c is to: Home              Anticipated d/c date is: 1 day              Patient currently is not medically stable to d/c.        No intake/output data recorded. Total I/O In: 480 [P.O.:480] Out: -      Consultants:   Cardiology  Procedures: Thoracentesis  Antimicrobials: None  Subjective: Patient feels short of breath much better after thoracentesis.  No cough. Denies any abdominal pain or nausea vomiting. No fever or chills. No dysuria hematuria. No headache or dizziness.   Objective: Vitals:   07/15/20 2330 07/16/20 0031 07/16/20 0412 07/16/20 0731  BP: (!) 132/59 123/63 112/63 124/61  Pulse: 94 90 71 72  Resp: (!) 22 20 16 17   Temp:  98.4 F (36.9 C) 98.6 F (37 C) 97.8 F (36.6 C)  TempSrc:  Oral Oral Oral  SpO2: 91% 92% 100% 100%  Weight:  84.1 kg    Height:  5\' 4"  (1.626 m)      Intake/Output Summary (Last 24 hours) at 07/16/2020 1347  Last data filed at 07/16/2020 1042 Gross per 24 hour  Intake 480 ml  Output --  Net 480 ml   Filed Weights   07/15/20 1101 07/16/20 0031  Weight: 84.8 kg 84.1 kg    Examination:  General exam: Appears calm and comfortable  Respiratory system: Clear to auscultation. Respiratory effort normal. Cardiovascular system: S1 & S2 heard, RRR. No JVD, murmurs, rubs, gallops or clicks. Trace pedal edema. Gastrointestinal system: Abdomen is nondistended, soft and nontender. No organomegaly or masses felt. Normal bowel  sounds heard. Central nervous system: Alert and oriented. No focal neurological deficits. Extremities: Symmetric. Skin: No rashes, lesions or ulcers Psychiatry: Judgement and insight appear normal. Mood & affect appropriate.     Data Reviewed: I have personally reviewed following labs and imaging studies  CBC: Recent Labs  Lab 07/15/20 1108 07/16/20 0438  WBC 15.8* 15.7*  HGB 15.1* 14.0  HCT 47.1* 44.4  MCV 88.0 88.4  PLT 451* 505*   Basic Metabolic Panel: Recent Labs  Lab 07/15/20 1108 07/16/20 0438  NA 134* 133*  K 4.1 3.6  CL 95* 94*  CO2 27 26  GLUCOSE 122* 104*  BUN 13 16  CREATININE 0.70 0.78  CALCIUM 9.6 9.2   GFR: Estimated Creatinine Clearance: 69.7 mL/min (by C-G formula based on SCr of 0.78 mg/dL). Liver Function Tests: No results for input(s): AST, ALT, ALKPHOS, BILITOT, PROT, ALBUMIN in the last 168 hours. No results for input(s): LIPASE, AMYLASE in the last 168 hours. No results for input(s): AMMONIA in the last 168 hours. Coagulation Profile: No results for input(s): INR, PROTIME in the last 168 hours. Cardiac Enzymes: No results for input(s): CKTOTAL, CKMB, CKMBINDEX, TROPONINI in the last 168 hours. BNP (last 3 results) No results for input(s): PROBNP in the last 8760 hours. HbA1C: No results for input(s): HGBA1C in the last 72 hours. CBG: No results for input(s): GLUCAP in the last 168 hours. Lipid Profile: No results for input(s): CHOL, HDL, LDLCALC, TRIG, CHOLHDL, LDLDIRECT in the last 72 hours. Thyroid Function Tests: Recent Labs    07/15/20 1655  TSH 2.873   Anemia Panel: No results for input(s): VITAMINB12, FOLATE, FERRITIN, TIBC, IRON, RETICCTPCT in the last 72 hours. Sepsis Labs: Recent Labs  Lab 07/16/20 0438  PROCALCITON <0.10    Recent Results (from the past 240 hour(s))  Respiratory Panel by RT PCR (Flu A&B, Covid) - Nasopharyngeal Swab     Status: None   Collection Time: 07/15/20 12:28 PM   Specimen: Nasopharyngeal  Swab  Result Value Ref Range Status   SARS Coronavirus 2 by RT PCR NEGATIVE NEGATIVE Final    Comment: (NOTE) SARS-CoV-2 target nucleic acids are NOT DETECTED.  The SARS-CoV-2 RNA is generally detectable in upper respiratoy specimens during the acute phase of infection. The lowest concentration of SARS-CoV-2 viral copies this assay can detect is 131 copies/mL. A negative result does not preclude SARS-Cov-2 infection and should not be used as the sole basis for treatment or other patient management decisions. A negative result may occur with  improper specimen collection/handling, submission of specimen other than nasopharyngeal swab, presence of viral mutation(s) within the areas targeted by this assay, and inadequate number of viral copies (<131 copies/mL). A negative result must be combined with clinical observations, patient history, and epidemiological information. The expected result is Negative.  Fact Sheet for Patients:  PinkCheek.be  Fact Sheet for Healthcare Providers:  GravelBags.it  This test is no t yet approved or cleared by the [place] FDA  and  has been authorized for detection and/or diagnosis of SARS-CoV-2 by FDA under an Emergency Use Authorization (EUA). This EUA will remain  in effect (meaning this test can be used) for the duration of the COVID-19 declaration under Section 564(b)(1) of the Act, 21 U.S.C. section 360bbb-3(b)(1), unless the authorization is terminated or revoked sooner.     Influenza A by PCR NEGATIVE NEGATIVE Final   Influenza B by PCR NEGATIVE NEGATIVE Final    Comment: (NOTE) The Xpert Xpress SARS-CoV-2/FLU/RSV assay is intended as an aid in  the diagnosis of influenza from Nasopharyngeal swab specimens and  should not be used as a sole basis for treatment. Nasal washings and  aspirates are unacceptable for Xpert Xpress SARS-CoV-2/FLU/RSV  testing.  Fact Sheet for  Patients: PinkCheek.be  Fact Sheet for Healthcare Providers: GravelBags.it  This test is not yet approved or cleared by the [place] FDA and  has been authorized for detection and/or diagnosis of SARS-CoV-2 by  FDA under an Emergency Use Authorization (EUA). This EUA will remain  in effect (meaning this test can be used) for the duration of the  Covid-19 declaration under Section 564(b)(1) of the Act, 21  U.S.C. section 360bbb-3(b)(1), unless the authorization is  terminated or revoked. Performed at [facility], [address] [place], [place] [address]   Gram stain     Status: None   Collection Time: 07/15/20  3:39 PM   Specimen: PATH Cytology Pleural fluid  Result Value Ref Range Status   Specimen Description PLEURAL  Final   Special Requests PLEURAL  Final   Gram Stain   Final    CYTOSPIN SLIDE WBC PRESENT, PREDOMINANTLY MONONUCLEAR NO ORGANISMS SEEN Performed at [facility], [address] [place], [place] [address]    Report Status 07/15/2020 FINAL  Final         Radiology Studies: DG Chest 2 View  Result Date: 07/15/2020 CLINICAL DATA:  Shortness of breath. Concern for possible pleural effusion. EXAM: CHEST - 2 VIEW COMPARISON:  Chest radiograph 05/03/2020. FINDINGS: Large left pleural effusion. Possible slight shift of the mediastinum to the right, although the patient is rotated. No focal consolidation. Possible 1.1 cm pulmonary nodule in the right midlung. No visible pneumothorax. Left axillary clips. No acute osseous abnormality. IMPRESSION: 1. Large left pleural effusion. 2. Possible 1.1 cm pulmonary nodule in the right midlung. Recommend CT of the chest with contrast to further evaluate. Findings and recommendations discussed with Dr. Quentin Cornwall at 11:53 a.m. via telephone. Electronically Signed   By: Margaretha Sheffield MD   On: 07/15/2020 11:56   CT Chest W Contrast  Result Date:  07/15/2020 CLINICAL DATA:  Increased shortness of breath over the past 2-3 weeks. Large left pleural effusion with slight shift of the mediastinum to the right on chest radiographs earlier today. There is also a possible 1.1 cm nodule in the right mid lung zone on the radiographs. EXAM: CT CHEST WITH CONTRAST TECHNIQUE: Multidetector CT imaging of the chest was performed during intravenous contrast administration. CONTRAST:  57mL OMNIPAQUE IOHEXOL 300 MG/ML  SOLN COMPARISON:  10/05/2016. Chest radiographs obtained earlier today. FINDINGS: Cardiovascular: Atheromatous calcifications, including the coronary arteries and aorta. Stable enlarged heart. Interval small pericardial effusion with a maximum thickness of 5 mm. Enlarged central pulmonary arteries with a main pulmonary artery transverse diameter of 3.5 cm. Minimally enlarged ascending thoracic aorta with a maximum diameter of 4.1 cm, unchanged. Mediastinum/Nodes: Increased mediastinal shift to the right since 10/05/2016, with some shift  to the right present at that time. No enlarged mediastinal, hilar, or axillary lymph nodes. Thyroid gland, trachea, and esophagus demonstrate no significant findings. Lungs/Pleura: Moderate to large-sized left pleural effusion with a significant increase in amount. Associated compressive atelectasis of the left lung. Clear right lung. No pulmonary nodule in the right mid lung zone. No pleural fluid on the right. Upper Abdomen: Large gallstone in the gallbladder measuring 3.3 cm in maximum diameter. No gallbladder wall thickening or pericholecystic fluid. Unremarkable included portion of the liver. Musculoskeletal: Postmastectomy changes on the left. Thoracic spine degenerative changes. Mild chronic compression deformities of 4 thoracic vertebral bodies, without significant change. IMPRESSION: 1. Moderate to large-sized left pleural effusion with a significant increase in amount. 2. Associated compressive atelectasis of the left  lung. 3. Interval small pericardial effusion. 4. Stable cardiomegaly. 5. Enlarged central pulmonary arteries, compatible with pulmonary arterial hypertension. 6. Stable minimally enlarged ascending thoracic aorta with a maximum diameter of 4.1 cm. Recommend annual imaging followup by CTA or MRA. This recommendation follows 2010 ACCF/AHA/AATS/ACR/ASA/SCA/SCAI/SIR/STS/SVM Guidelines for the Diagnosis and Management of Patients with Thoracic Aortic Disease. Circulation. 2010; 121: A250-N397. Aortic aneurysm NOS (ICD10-I71.9) 7. Cholelithiasis. 8. Calcific coronary artery and aortic atherosclerosis. Aortic Atherosclerosis (ICD10-I70.0). Electronically Signed   By: Claudie Revering M.D.   On: 07/15/2020 13:10   DG Chest Port 1 View  Result Date: 07/15/2020 CLINICAL DATA:  Post large volume left-sided thoracentesis. EXAM: PORTABLE CHEST 1 VIEW COMPARISON:  Earlier same day; 05/03/2020 11/28/2019; chest CT-earlier same day; 06/26/2005 FINDINGS: Grossly unchanged enlarged cardiac silhouette and mediastinal contours with atherosclerotic plaque within the thoracic aorta. Interval reduction/near resolution of left-sided pleural effusion post thoracentesis. No pneumothorax. Improved aeration of left lung base with persistent left basilar opacities, likely atelectasis. Mild pulmonary venous congestion without frank evidence of edema. No right sided pleural effusion or pneumothorax. No acute osseous abnormalities. Degenerative change the bilateral acromioclavicular joints is suspected though incompletely evaluated. Chondroid lesion is seen within the left humeral head similar to remote chest CT performed in 2006 IMPRESSION: 1. Interval reduction/near resolution of left-sided pleural effusion post thoracentesis. No pneumothorax. 2. Pulmonary venous congestion without frank evidence of edema. Electronically Signed   By: Sandi Mariscal M.D.   On: 07/15/2020 16:15   US THORACENTESIS ASP PLEURAL SPACE W/IMG GUIDE  Result Date:  07/15/2020 INDICATION: Symptomatic left-sided pleural effusion. Please from ultrasound-guided paracentesis for diagnostic and therapeutic purposes. EXAM: US THORACENTESIS ASP PLEURAL SPACE W/IMG GUIDE COMPARISON:  Chest radiograph-earlier same day; chest CT-earlier same day MEDICATIONS: None. COMPLICATIONS: None immediate. TECHNIQUE: Informed written consent was obtained from the patient after a discussion of the risks, benefits and alternatives to treatment. A timeout was performed prior to the initiation of the procedure. Initial ultrasound scanning demonstrates a large anechoic left-sided pleural effusion. The lower chest was prepped and draped in the usual sterile fashion. 1% lidocaine was used for local anesthesia. An ultrasound image was saved for documentation purposes. An 8 Fr Safe-T-Centesis catheter was introduced. The thoracentesis was performed. The catheter was removed and a dressing was applied. The patient tolerated the procedure well without immediate post procedural complication. The patient was escorted to have an upright chest radiograph. FINDINGS: A total of approximately 1.4 liters of serous fluid was removed. Requested samples were sent to the laboratory. IMPRESSION: Successful ultrasound-guided left sided thoracentesis yielding 1.4 liters of pleural fluid. Electronically Signed   By: Sandi Mariscal M.D.   On: 07/15/2020 16:16        Scheduled Meds: . aspirin  EC  325 mg Oral Daily  . carvedilol  9.375 mg Oral BID WC  . clopidogrel  75 mg Oral Daily  . dapagliflozin propanediol  10 mg Oral Daily  . FLUoxetine  20 mg Oral Daily  . furosemide  40 mg Intravenous Once  . furosemide  40 mg Oral Daily  . [START ON 07/17/2020] heparin injection (subcutaneous)  5,000 Units Subcutaneous Q8H  . [START ON 07/17/2020] influenza vaccine adjuvanted  0.5 mL Intramuscular Tomorrow-1000  . rosuvastatin  10 mg Oral Daily  . sacubitril-valsartan  1 tablet Oral BID  . spironolactone  25 mg Oral Daily    Continuous Infusions:   LOS: 0 days    Time spent: 26 minutes    Sharen Hones, MD [facility]   To contact the attending provider between 7A-7P or the covering provider during after hours 7P-7A, please log into the web site [email or web address] and access using universal [place] password for that web site. If you do not have the password, please call the hospital operator.  07/16/2020, 1:47 PM

## 2020-07-16 NOTE — Care Management Obs Status (Signed)
[facility] NOTIFICATION   Patient Details  Name: [name] [name] MRN: [id number] Date of Birth: Jun 27, 1951   Medicare Observation Status Notification Given:  Yes    Shelbie Hutching, RN 07/16/2020, 1:15 PM

## 2020-07-17 DIAGNOSIS — I5023 Acute on chronic systolic (congestive) heart failure: Secondary | ICD-10-CM | POA: Diagnosis not present

## 2020-07-17 DIAGNOSIS — J90 Pleural effusion, not elsewhere classified: Secondary | ICD-10-CM | POA: Diagnosis not present

## 2020-07-17 LAB — CBC WITH DIFFERENTIAL/PLATELET
Abs Immature Granulocytes: 0.07 10*3/uL (ref 0.00–0.07)
Basophils Absolute: 0 10*3/uL (ref 0.0–0.1)
Basophils Relative: 0 %
Eosinophils Absolute: 0.4 10*3/uL (ref 0.0–0.5)
Eosinophils Relative: 3 %
HCT: 43.5 % (ref 36.0–46.0)
Hemoglobin: 13.9 g/dL (ref 12.0–15.0)
Immature Granulocytes: 1 %
Lymphocytes Relative: 13 %
Lymphs Abs: 2 10*3/uL (ref 0.7–4.0)
MCH: 27.7 pg (ref 26.0–34.0)
MCHC: 32 g/dL (ref 30.0–36.0)
MCV: 86.7 fL (ref 80.0–100.0)
Monocytes Absolute: 1.1 10*3/uL — ABNORMAL HIGH (ref 0.1–1.0)
Monocytes Relative: 7 %
Neutro Abs: 11.3 10*3/uL — ABNORMAL HIGH (ref 1.7–7.7)
Neutrophils Relative %: 76 %
Platelets: 430 10*3/uL — ABNORMAL HIGH (ref 150–400)
RBC: 5.02 MIL/uL (ref 3.87–5.11)
RDW: 14.4 % (ref 11.5–15.5)
WBC: 14.9 10*3/uL — ABNORMAL HIGH (ref 4.0–10.5)
nRBC: 0 % (ref 0.0–0.2)

## 2020-07-17 LAB — BASIC METABOLIC PANEL
Anion gap: 12 (ref 5–15)
BUN: 19 mg/dL (ref 8–23)
CO2: 27 mmol/L (ref 22–32)
Calcium: 8.9 mg/dL (ref 8.9–10.3)
Chloride: 94 mmol/L — ABNORMAL LOW (ref 98–111)
Creatinine, Ser: 0.56 mg/dL (ref 0.44–1.00)
GFR, Estimated: >60 mL/min (ref >60)
Glucose, Bld: 111 mg/dL — ABNORMAL HIGH (ref 70–99)
Potassium: 3.8 mmol/L (ref 3.5–5.1)
Sodium: 133 mmol/L — ABNORMAL LOW (ref 135–145)

## 2020-07-17 LAB — MAGNESIUM: Magnesium: 2.4 mg/dL (ref 1.7–2.4)

## 2020-07-17 NOTE — Discharge Summary (Addendum)
Physician Discharge Summary  Patient ID: [name] [name] MRN: [id number] DOB/AGE: 12-26-1950 69 y.o.  Admit date: 07/15/2020 Discharge date: 07/17/2020  Admission Diagnoses:  Discharge Diagnoses:  Active Problems:   Pleural effusion on left   Acute on chronic systolic (congestive) heart failure (HCC)   Thoracic aortic aneurysm (HCC)   Hyponatremia   Reactive thrombocytosis   Discharged Condition: good  Hospital Course:  Jadis Pitter Johnsonis a 69 y.o.femalewith medical history significant forchronic heart failure reduced EF due to ischemic cardiomyopathy, hypertension, hyperlipidemia, arthritis, left DCIS breast cancer status postmastectomy, coronary artery disease status post DES PCI to the LAD in 04/2020, presented at the recommendation of her cardiologist.  Shortness of breath started about 3 weeks ago, worse with exertion.  CT scan showed fairly large left-sided pleural effusion, thoracentesis was performed on 11/4.  Removed 1.4 L of fluid.  Initial study suggested exudates, cultures pending.  Procalcitonin level negative. Patient was also given IV Lasix by cardiology for exacerbation congestive heart failure.  #1.  Left-sided pleural effusion. Status post thoracentesis.  Initial lab work showed exudates.    Gram stain negative. procalcitonin level negative.    Not infectious. Cytology did not show any malignancy.  2.  Acute on chronic systolic congestive heart failure with ejection fraction 25 to 30%. Patient was given IV Lasix by cardiology. Patient is on maximal treatment with Entresto, Aldactone, Coreg and farxiga.   3.  Essential hypertension. Continue current treatment.  4.  Dyspnea with exertion. Secondary to pleural effusion and acute on chronic systolic congestive heart failure.  Patient short of breath much improved after thoracentesis.  I do not have suspicion for PE.  She already had a CT scan with contrast yesterday, I would not recommend another CT angiogram of  the chest. Patient symptom was much improved after thoracentesis, indicating pleural effusion play a big role in her symptoms.  5.  Aortic aneurysm. Follow-up with PCP and cardiology as outpatient.  6.  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   7.  Leukocytosis and thrombocytosis. No clear infection.  Follow with PCP.    Consults: cardiology  Significant Diagnostic Studies:  US THORACENTESIS ASP PLEURAL SPACE W/IMG GUIDE  COMPARISON:  Chest radiograph-earlier same day; chest CT-earlier same day  MEDICATIONS: None.  COMPLICATIONS: None immediate.  TECHNIQUE: Informed written consent was obtained from the patient after a discussion of the risks, benefits and alternatives to treatment. A timeout was performed prior to the initiation of the procedure.  Initial ultrasound scanning demonstrates a large anechoic left-sided pleural effusion. The lower chest was prepped and draped in the usual sterile fashion. 1% lidocaine was used for local anesthesia. An ultrasound image was saved for documentation purposes. An 8 Fr Safe-T-Centesis catheter was introduced. The thoracentesis was performed. The catheter was removed and a dressing was applied. The patient tolerated the procedure well without immediate post procedural complication. The patient was escorted to have an upright chest radiograph.  FINDINGS: A total of approximately 1.4 liters of serous fluid was removed. Requested samples were sent to the laboratory.  IMPRESSION: Successful ultrasound-guided left sided thoracentesis yielding 1.4 liters of pleural fluid.   Electronically Signed   By: Sandi Mariscal M.D.   On: 07/15/2020 16:16  CT CHEST WITH CONTRAST  TECHNIQUE: Multidetector CT imaging of the chest was performed during intravenous contrast administration.  CONTRAST:  36mL OMNIPAQUE IOHEXOL 300 MG/ML  SOLN  COMPARISON:  10/05/2016. Chest radiographs obtained earlier  today.  FINDINGS: Cardiovascular: Atheromatous calcifications, including the coronary arteries and  aorta. Stable enlarged heart. Interval small pericardial effusion with a maximum thickness of 5 mm. Enlarged central pulmonary arteries with a main pulmonary artery transverse diameter of 3.5 cm. Minimally enlarged ascending thoracic aorta with a maximum diameter of 4.1 cm, unchanged.  Mediastinum/Nodes: Increased mediastinal shift to the right since 10/05/2016, with some shift to the right present at that time. No enlarged mediastinal, hilar, or axillary lymph nodes. Thyroid gland, trachea, and esophagus demonstrate no significant findings.  Lungs/Pleura: Moderate to large-sized left pleural effusion with a significant increase in amount. Associated compressive atelectasis of the left lung. Clear right lung. No pulmonary nodule in the right mid lung zone. No pleural fluid on the right.  Upper Abdomen: Large gallstone in the gallbladder measuring 3.3 cm in maximum diameter. No gallbladder wall thickening or pericholecystic fluid. Unremarkable included portion of the liver.  Musculoskeletal: Postmastectomy changes on the left. Thoracic spine degenerative changes. Mild chronic compression deformities of 4 thoracic vertebral bodies, without significant change.  IMPRESSION: 1. Moderate to large-sized left pleural effusion with a significant increase in amount. 2. Associated compressive atelectasis of the left lung. 3. Interval small pericardial effusion. 4. Stable cardiomegaly. 5. Enlarged central pulmonary arteries, compatible with pulmonary arterial hypertension. 6. Stable minimally enlarged ascending thoracic aorta with a maximum diameter of 4.1 cm. Recommend annual imaging followup by CTA or MRA. This recommendation follows 2010 ACCF/AHA/AATS/ACR/ASA/SCA/SCAI/SIR/STS/SVM Guidelines for the Diagnosis and Management of Patients with Thoracic Aortic Disease. Circulation.  2010; 121: Z308-M578. Aortic aneurysm NOS (ICD10-I71.9) 7. Cholelithiasis. 8. Calcific coronary artery and aortic atherosclerosis.  Aortic Atherosclerosis (ICD10-I70.0).   Electronically Signed   By: Claudie Revering M.D.   On: 07/15/2020 13:10  Echo: 1. Left ventricular ejection fraction, by estimation, is 25 to 30%. The left ventricle has severely decreased function. The left ventricle demonstrates global hypokinesis with severe hypokinesis of the anterospetal wall and basal anterior wall. The left ventricular internal cavity size was mildly dilated. 2. Right ventricular systolic function is normal. The right ventricular size is normal. Tricuspid regurgitation signal is inadequate for assessing PA pressure.   Treatments: Thoracentesis and IV Lasix.  Discharge Exam: Blood pressure 118/70, pulse 93, temperature 98.1 F (36.7 C), temperature source Oral, resp. rate 16, height 5\' 4"  (1.626 m), weight 84.1 kg, SpO2 95 %. General appearance: alert and cooperative Resp: clear to auscultation bilaterally Cardio: regular rate and rhythm, S1, S2 normal, no murmur, click, rub or gallop GI: soft, non-tender; bowel sounds normal; no masses,  no organomegaly Extremities: extremities normal, atraumatic, no cyanosis or edema  Disposition: Discharge disposition: 01-Home or Self Care       Discharge Instructions    Diet - low sodium heart healthy   Complete by: As directed    Fluid restriction 1576ml/day   Increase activity slowly   Complete by: As directed      Allergies as of 07/17/2020      Reactions   Codeine Other (See Comments)   Reation:  Hallucinations    Morphine Other (See Comments)   Reaction:  Hallucinations    Sulfur Rash   Ace Inhibitors Cough   Sulfonamide Derivatives Rash      Medication List    TAKE these medications   aspirin EC 81 MG tablet Take 81 mg by mouth daily.   Calcium 600+D 600-400 MG-UNIT tablet Generic drug: Calcium Carbonate-Vitamin  D Take 1 tablet by mouth 2 (two) times daily.   carvedilol 6.25 MG tablet Commonly known as: COREG TAKE 1.5 TABLETS (9.375 MG TOTAL) BY  MOUTH 2 (TWO) TIMES DAILY WITH A MEAL.   cetirizine 10 MG tablet Commonly known as: ZYRTEC Take 10 mg by mouth at bedtime.   clopidogrel 75 MG tablet Commonly known as: PLAVIX Take 1 tablet (75 mg total) by mouth daily.   dapagliflozin propanediol 10 MG Tabs tablet Commonly known as: FARXIGA Take 1 tablet (10 mg total) by mouth daily.   Entresto 49-51 MG Generic drug: sacubitril-valsartan TAKE 1 TABLET BY MOUTH TWICE A DAY   FLUoxetine 20 MG capsule Commonly known as: PROZAC Take 1 capsule by mouth once daily   fluticasone 50 MCG/ACT nasal spray Commonly known as: FLONASE Place 1-2 sprays into both nostrils daily as needed for rhinitis.   furosemide 40 MG tablet Commonly known as: LASIX TAKE 1 TABLET BY MOUTH EVERY DAY   GRAPE SEED EXTRACT PO Take 1 capsule by mouth 2 (two) times daily.   Krill Oil 1000 MG Caps Take 1,000 mg by mouth at bedtime.   meclizine 25 MG tablet Commonly known as: ANTIVERT Take 0.5-1 tablets (12.5-25 mg total) by mouth 3 (three) times daily as needed for dizziness (sedation caution).   multivitamin with minerals Tabs tablet Take 1 tablet by mouth daily.   nystatin cream Commonly known as: MYCOSTATIN Apply 1 application topically 2 (two) times daily.   Osteo Bi-Flex Adv Joint Shield Tabs Take 1 tablet by mouth 2 (two) times daily.   rosuvastatin 5 MG tablet Commonly known as: CRESTOR Take 2 tablets (10 mg total) by mouth daily.   spironolactone 25 MG tablet Commonly known as: ALDACTONE TAKE 1 TABLET BY MOUTH EVERY DAY       Follow-up Information    [facility] Follow up on 08/03/2020.   Specialty: Cardiology Why: at 10:30am. Enter through the [place] entrance Contact information: [place]  [place]       Owens Loffler, MD Follow up in 1 week(s).   Specialties: Family Medicine, Sports Medicine Contact information: [place] [address] [phone]        [name], MD .   Specialty: Cardiology Contact information: [place] [address] [phone]               Signed: Sharen Hones 07/17/2020, 9:25 AM

## 2020-07-17 NOTE — Progress Notes (Signed)
Progress Note  Patient Name: [name] [name] Date of Encounter: 07/17/2020  Primary Cardiologist: End  Subjective   SOB improved. Sore from the bed and leaning over the table. Did not have much UOP with IV Lasix yesterday. Cytology negative for malignancy. Pleural effusion consistent with exudate. Renal function stable. Vitals stable. No documented UOP. No weight this morning. Planning for discharge today.   Inpatient Medications    Scheduled Meds: . aspirin EC  325 mg Oral Daily  . carvedilol  9.375 mg Oral BID WC  . clopidogrel  75 mg Oral Daily  . dapagliflozin propanediol  10 mg Oral Daily  . FLUoxetine  20 mg Oral Daily  . furosemide  40 mg Oral Daily  . heparin injection (subcutaneous)  5,000 Units Subcutaneous Q8H  . influenza vaccine adjuvanted  0.5 mL Intramuscular Tomorrow-1000  . rosuvastatin  10 mg Oral Daily  . sacubitril-valsartan  1 tablet Oral BID  . spironolactone  25 mg Oral Daily   Continuous Infusions:  PRN Meds: acetaminophen   Vital Signs    Vitals:   07/16/20 2013 07/17/20 0057 07/17/20 0446 07/17/20 0853  BP: 136/65 (!) 107/59 122/67 118/70  Pulse: 80 69 72 93  Resp: 19  16 16   Temp: 98.3 F (36.8 C) 97.8 F (36.6 C) 98.2 F (36.8 C) 98.1 F (36.7 C)  TempSrc:  Oral Oral Oral  SpO2: 94%  94% 95%  Weight:      Height:        Intake/Output Summary (Last 24 hours) at 07/17/2020 0855 Last data filed at 07/16/2020 1424 Gross per 24 hour  Intake 600 ml  Output --  Net 600 ml   Filed Weights   07/15/20 1101 07/16/20 0031  Weight: 84.8 kg 84.1 kg    Telemetry    SR with PVCs - Personally Reviewed  ECG    No new tracings - Personally Reviewed  Physical Exam   GEN: No acute distress.   Neck: No JVD. Cardiac: RRR, no murmurs, rubs, or gallops.  Respiratory: Clear to auscultation bilaterally.  GI: Soft, nontender, non-distended.   MS: No edema; No deformity. Neuro:  Alert and oriented x 3; Nonfocal.  Psych: Normal  affect.  Labs    Chemistry Recent Labs  Lab 07/15/20 1108 07/16/20 0438 07/17/20 0516  NA 134* 133* 133*  K 4.1 3.6 3.8  CL 95* 94* 94*  CO2 27 26 27   GLUCOSE 122* 104* 111*  BUN 13 16 19   CREATININE 0.70 0.78 0.56  CALCIUM 9.6 9.2 8.9  GFRNONAA >60 >60 >60  ANIONGAP 12 13 12      Hematology Recent Labs  Lab 07/15/20 1108 07/16/20 0438 07/17/20 0516  WBC 15.8* 15.7* 14.9*  RBC 5.35* 5.02 5.02  HGB 15.1* 14.0 13.9  HCT 47.1* 44.4 43.5  MCV 88.0 88.4 86.7  MCH 28.2 27.9 27.7  MCHC 32.1 31.5 32.0  RDW 14.5 14.5 14.4  PLT 451* 407* 430*    Cardiac EnzymesNo results for input(s): TROPONINI in the last 168 hours. No results for input(s): TROPIPOC in the last 168 hours.   BNPNo results for input(s): BNP, PROBNP in the last 168 hours.   DDimer No results for input(s): DDIMER in the last 168 hours.   Radiology    DG Chest 2 View  Result Date: 07/15/2020 IMPRESSION: 1. Large left pleural effusion. 2. Possible 1.1 cm pulmonary nodule in the right midlung. Recommend CT of the chest with contrast to further evaluate. Findings and recommendations  discussed with Dr. Quentin Cornwall at 11:53 a.m. via telephone. Electronically Signed   By: Margaretha Sheffield MD   On: 07/15/2020 11:56   CT Chest W Contrast  Result Date: 07/15/2020 IMPRESSION: 1. Moderate to large-sized left pleural effusion with a significant increase in amount. 2. Associated compressive atelectasis of the left lung. 3. Interval small pericardial effusion. 4. Stable cardiomegaly. 5. Enlarged central pulmonary arteries, compatible with pulmonary arterial hypertension. 6. Stable minimally enlarged ascending thoracic aorta with a maximum diameter of 4.1 cm. Recommend annual imaging followup by CTA or MRA. This recommendation follows 2010 ACCF/AHA/AATS/ACR/ASA/SCA/SCAI/SIR/STS/SVM Guidelines for the Diagnosis and Management of Patients with Thoracic Aortic Disease. Circulation. 2010; 121: [id number]. Aortic aneurysm NOS  (ICD10-I71.9) 7. Cholelithiasis. 8. Calcific coronary artery and aortic atherosclerosis. Aortic Atherosclerosis (ICD10-I70.0). Electronically Signed   By: Claudie Revering M.D.   On: 07/15/2020 13:10   DG Chest Port 1 View  Result Date: 07/15/2020 IMPRESSION: 1. Interval reduction/near resolution of left-sided pleural effusion post thoracentesis. No pneumothorax. 2. Pulmonary venous congestion without frank evidence of edema. Electronically Signed   By: Sandi Mariscal M.D.   On: 07/15/2020 16:15   US THORACENTESIS ASP PLEURAL SPACE W/IMG GUIDE  Result Date: 07/15/2020 IMPRESSION: Successful ultrasound-guided left sided thoracentesis yielding 1.4 liters of pleural fluid. Electronically Signed   By: Sandi Mariscal M.D.   On: 07/15/2020 16:16    Cardiac Studies   2D echo 07/16/2020: 1. Left ventricular ejection fraction, by estimation, is 25 to 30%. The  left ventricle has severely decreased function. The left ventricle  demonstrates global hypokinesis with severe hypokinesis of the  anterospetal wall and basal anterior wall. The left  ventricular internal cavity size was mildly dilated.  2. Right ventricular systolic function is normal. The right ventricular  size is normal. Tricuspid regurgitation signal is inadequate for assessing  PA pressure.  __________  [facility] 05/03/2020: Conclusions: 1. Severe two-vessel coronary artery disease with diffuse LAD disease of up to 70-80% that is significant by IVUS, as well as CTO of RCA. 2. Upper normal LVEDP. 3. Successful IVUS-guided PCI to mid/distal LAD using overlapping Resolute Onyx 2.5 x 38 mm and 2.25 x 34 mm drug-eluting stents with 0% residual stenosis and TIMI-3 flow.  Recommendations: 1. Overnight extended recovery given multivessel CAD and LVEF < 35%. 2. Obtain CXR and UA/urine culture for further evaluation of leukocytosis. 3. Dual antiplatelet therapy with ASA and clopidogrel for at lest 6 months, ideally longer. 4. Recheck lipid panel in AM  and escalate statin therapy, as tolerated, if LDL > 70. __________  Cardiac MRI 04/17/2020: 1. Severe left ventricular enlargement with severely reduced LV function, LVEF 33%. Akinesis of inferior and inferolateral walls.  2. Post-contrast delayed myocardial enhancement in the inferior and inferolateral walls, with greater than 50% of the myocardial thickness involved suggesting no viability in the RCA territory.  3. Normal right ventricular size and systolic function. __________  2D echo 03/01/2020: 1. Left ventricular ejection fraction, by estimation, is 25 to 30%. The  left ventricle has severely decreased function. The left ventricle  demonstrates global hypokinesis. The left ventricular internal cavity size  was mildly dilated. Left ventricular  diastolic parameters are consistent with Grade II diastolic dysfunction  (pseudonormalization).  2. Right ventricular systolic function is normal. The right ventricular  size is normal. Tricuspid regurgitation signal is inadequate for assessing  PA pressure.  3. Left atrial size was mildly dilated.  4. The mitral valve is normal in structure. Mild mitral valve  regurgitation. No evidence  of mitral stenosis.  5. The aortic valve is normal in structure. Aortic valve regurgitation is  not visualized. Mild to moderate aortic valve sclerosis/calcification is  present, without any evidence of aortic stenosis. __________  [facility] 12/02/2019: Conclusions: 1. Significant two-vessel coronary artery disease with diffuse LAD disease of up to 70-80% in the mid/distal vessel and chronic subtotal occlusion of the mid RCA with left-to-right collaterals. 2. Moderately elevated left heart, right heart, and pulmonary artery pressures. 3. Low normal Fick cardiac output/index.  Recommendations: 1. Escalate diuresis and optimize evidence-based heart failure therapy. 2. Medical management of coronary artery disease. Small vessel size and diffuse  disease involving LAD and RCA make percutaneous and surgical revascularization suboptimal. __________  2D echo 11/29/2019: 1. Left ventricular ejection fraction, by estimation, is 25 to 30%. The  left ventricle has severely decreased function. The left ventricle  demonstrates global hypokinesis. The left ventricular internal cavity size  was mildly dilated. There is mild left  ventricular hypertrophy. Left ventricular diastolic parameters are  consistent with Grade I diastolic dysfunction (impaired relaxation).  2. Right ventricular systolic function is normal. The right ventricular  size is normal. Tricuspid regurgitation signal is inadequate for assessing  PA pressure.  3. Left atrial size was mildly dilated.  4. The inferior vena cava is normal in size with greater than 50%  respiratory variability, suggesting right atrial pressure of 3 mmHg.  Patient Profile     69 y.o. female with history of CAD status post PCI to the LAD in 04/2020, HFrEF secondary to ICM, breast cancer status post surgery without chemoradiation in 2004, HTN, and HLD who is being seen today for the evaluation of left pleural effusion at the request of Dr. Tobie Poet.  Assessment & Plan    1. Acute hypoxic respiratory distress with large left pleural effusion: -Symptoms much improved status post ultrasound-guided thoracentesis of 1.4 L of pleural fluid removed with Light's criteria met concerning for exudative effusion -Cultures remain negative -Cytology negative for malignancy  -Weaned to room air -Echo as above -Given significant improvement in symptoms and in the setting of CT chest with contrast in the ED, CTA has been deferred   2.  CAD involving the native coronary arteries: -No symptoms of angina with dyspnea resolved following thoracentesis -High-sensitivity troponin negative x2 -Echo as above -Continue current medical therapy including dual antiplatelet therapy with aspirin and Plavix without  interruption as well as carvedilol and Crestor -At this time, given the above exudative effusion, improvement in symptoms, and in the context of negative troponins no plans for inpatient ischemic evaluation  3.  HFrEF secondary to ICM: -Overall she appears grossly euvolemic and well compensated -Suspect her progressive dyspnea was in the context of worsening left-sided pleural effusion -Continue GDMT including carvedilol, Entresto, spironolactone, Lasix, and Farxiga -Daily weights -Strict I's and O's -Echo as above -As an outpatient, consider referral to advanced heart failure clinic in [place]  4.  Leukocytosis/thrombocytosis: -Uncertain etiology  -Management per internal medicine  5. HTN: -Blood pressure well controlled -Continue current medical therapy as outlined above  6. HLD:  -LDL of 48 from 04/2020 -Crestor  Dispo: -Ok for discharge from cardiac perspective on current medications, please wait until staffed by Dr. Garen Lah to discharge. I will arrange follow up in our office.    For questions or updates, please contact [place] Please consult [email or web address] for contact info under Cardiology/STEMI.    Signed, Christell Faith, PA-C [place] Pager: [phone] 07/17/2020, 8:55 AM

## 2020-07-17 NOTE — Plan of Care (Signed)
Pt AAox4, VS stable, No complaints today, Plan to discharge home with self care. D/C instructions reviewed with patient. No questions or concerns. IV removed

## 2020-07-19 ENCOUNTER — Telehealth: Payer: Self-pay

## 2020-07-19 NOTE — Telephone Encounter (Signed)
Transition Care Management Follow-up Telephone Call  Date of discharge and from where: 07/20/2020, [facility]  How have you been since you were released from the hospital? Patient states that she is feeling so much better.   Any questions or concerns? No  Items Reviewed:  Did the pt receive and understand the discharge instructions provided? Yes   Medications obtained and verified? Yes   Other? No   Any new allergies since your discharge? No   Dietary orders reviewed? Yes  Do you have support at home? Yes   Home Care and Equipment/Supplies: Were home health services ordered? not applicable If so, what is the name of the agency? N/A  Has the agency set up a time to come to the patient's home? not applicable Were any new equipment or medical supplies ordered?  No What is the name of the medical supply agency? N/A Were you able to get the supplies/equipment? not applicable Do you have any questions related to the use of the equipment or supplies? No  Functional Questionnaire: (I = Independent and D = Dependent) ADLs: I  Bathing/Dressing- I  Meal Prep- I  Eating- I  Maintaining continence- I  Transferring/Ambulation- I  Managing Meds- I  Follow up appointments reviewed:   PCP Hospital f/u appt confirmed? No  Patient states that she feels fine and does not need a follow up visit right now.  [facility] f/u appt confirmed? follow up with cardiology   Are transportation arrangements needed? No   If their condition worsens, is the pt aware to call PCP or go to the Emergency Dept.? Yes  Was the patient provided with contact information for the PCP's office or ED? Yes  Was to pt encouraged to call back with questions or concerns? Yes

## 2020-07-20 ENCOUNTER — Telehealth: Payer: Self-pay | Admitting: Internal Medicine

## 2020-07-20 NOTE — Telephone Encounter (Signed)
-----   Message from Rise Mu, PA-C sent at 07/17/2020  9:09 AM EDT ----- Please schedule TCM follow up with an APP or Dr. Saunders Revel in 7-14 days. Thanks!

## 2020-07-20 NOTE — Telephone Encounter (Signed)
  TCM....  Patient is being discharged   They saw End  They are scheduled to see Gilford Rile on 11/15  They were seen for sob  They need to be seen within 2 weeks  Pt not placed wait list   Please call

## 2020-07-20 NOTE — Telephone Encounter (Signed)
Patient contacted regarding discharge from [facility] on 07/17/20.  Patient understands to follow up with provider Gilford Rile, NP on 07/26/20 at 10 am at [facility]. Patient understands discharge instructions? yes Patient understands medications and regiment? yes Patient understands to bring all medications to this visit? yes  Ask patient:  Are you enrolled in My Chart - yes

## 2020-07-21 ENCOUNTER — Encounter: Payer: Self-pay | Admitting: *Deleted

## 2020-07-21 DIAGNOSIS — Z955 Presence of coronary angioplasty implant and graft: Secondary | ICD-10-CM

## 2020-07-26 ENCOUNTER — Other Ambulatory Visit: Payer: Self-pay

## 2020-07-26 ENCOUNTER — Ambulatory Visit (INDEPENDENT_AMBULATORY_CARE_PROVIDER_SITE_OTHER): Payer: Medicare Other | Admitting: Family

## 2020-07-26 ENCOUNTER — Encounter: Payer: Self-pay | Admitting: Family

## 2020-07-26 VITALS — BP 128/88 | HR 72 | Ht 64.0 in | Wt 188.0 lb

## 2020-07-26 DIAGNOSIS — I25118 Atherosclerotic heart disease of native coronary artery with other forms of angina pectoris: Secondary | ICD-10-CM

## 2020-07-26 DIAGNOSIS — I10 Essential (primary) hypertension: Secondary | ICD-10-CM

## 2020-07-26 DIAGNOSIS — J90 Pleural effusion, not elsewhere classified: Secondary | ICD-10-CM

## 2020-07-26 DIAGNOSIS — I255 Ischemic cardiomyopathy: Secondary | ICD-10-CM | POA: Diagnosis not present

## 2020-07-26 DIAGNOSIS — E785 Hyperlipidemia, unspecified: Secondary | ICD-10-CM | POA: Diagnosis not present

## 2020-07-26 DIAGNOSIS — I5022 Chronic systolic (congestive) heart failure: Secondary | ICD-10-CM

## 2020-07-26 NOTE — Progress Notes (Signed)
Office Visit    Patient Name: [name] [name] Date of Encounter: 07/26/2020  Primary Care Provider:  Owens Loffler, MD Primary Cardiologist:  [name], MD Electrophysiologist:  None   Chief Complaint    [name] [name] is a 69 y.o. female with a hx of multivessel CAD, HFrEF, HTN, HLD, arthritis, breast cancer presents today for hospital follow-up  Past Medical History    Past Medical History:  Diagnosis Date   Allergic rhinitis    Avulsion fracture of distal fibula 3/10   and sprain; Left   Breast cancer ([place]) 2004   Left Breast Cancer   CHF (congestive heart failure) (HCC)    Coronary artery disease    a. 10/2012 ETT: nl; b. 12/2012 Cath: LM nl, LAD min irregs, D1 min irregs, LCX 30p, RCA 60m, RPDA 40p, EF 50%.   Diastolic dysfunction    a. 12/2012 Echo: EF 50-55%. No rwma. Gr1 DD. Nl RV fxn. Nl PASP.   History of hepatitis B    ?--tested + by [facility]   HLD (hyperlipidemia)    HTN (hypertension)    Osteoarthritis    Osteopenia    Past Surgical History:  Procedure Laterality Date   ABDOMINAL HYSTERECTOMY  1985   endometriosis; anatomy   BREAST SURGERY  10/2002   CARDIAC CATHETERIZATION  12/19/12   [place]; EF 50%   COLONOSCOPY  8/09   Hyperplastic polyp; repeat in 10 years   CORONARY STENT INTERVENTION N/A 05/03/2020   Procedure: CORONARY STENT INTERVENTION;  Surgeon: [name], MD;  Location: [place] [facility];  Service: Cardiovascular;  Laterality: N/A;   HIP FRACTURE SURGERY  06/14/2011   ball replaced (Dr. Earvin Hansen)   INTRAVASCULAR ULTRASOUND/IVUS N/A 05/03/2020   Procedure: Intravascular Ultrasound/IVUS;  Surgeon: [name], MD;  Location: [place] [facility];  Service: Cardiovascular;  Laterality: N/A;   LEFT HEART CATH N/A 05/03/2020   Procedure: Left Heart Cath;  Surgeon: [name], MD;  Location: [place] [facility];  Service: Cardiovascular;  Laterality: N/A;   MASTECTOMY Left 2004   RIGHT/LEFT HEART CATH AND  CORONARY ANGIOGRAPHY N/A 12/02/2019   Procedure: RIGHT/LEFT HEART CATH AND CORONARY ANGIOGRAPHY;  Surgeon: [name], MD;  Location: [place] [facility];  Service: Cardiovascular;  Laterality: N/A;   TONSILLECTOMY  1958    Allergies  Allergies  Allergen Reactions   Codeine Other (See Comments)    Reation:  Hallucinations    Morphine Other (See Comments)    Reaction:  Hallucinations    Sulfur Rash   Ace Inhibitors Cough   Sulfonamide Derivatives Rash    History of Present Illness    [name] [name] is a 69 y.o. female with a hx of multivessel CAD, HFrEF, HTN, HLD, arthritis, breast cancer last seen while hospitalized.  Hospitalized March 2021 with exertional dyspnea and noted newly reduced LVEF 25-30% by echo. Treated medically with diuresis and GDMT.  Cardiac catheterization with significant two-vessel CAD with diffuse LAD stenosis of 70-80% in the mid to distal vessel and chronic subtotal occlusion of the mid RCA with left-to-right collaterals.  Left and right heart filling pressures remained moderately elevated in setting of reduced cardiac output.  Medical therapy was recommended as small vessel size and diffuse disease limited revascularization options.  Discharged on atorvastatin, aspirin, carvedilol, fenofibrate, furosemide, Entresto, spironolactone.  Follow-up 12/26/2019 she noted myalgias and her fenofibrate and statin were discontinued.  She was transitioned from losartan to Entresto approximately 12/09/2019. She did have mild cough, but was  started on antihistamine for allergies and cough resolved. 01/23/20 she was started on Rosuvastatin and has tolerated without recurrent myalgias.   Repeat echo 03/01/20 with continued LVEF 56-43%, grade 2 diastolic dysfunction.  When seen in clinic 03/10/20 cardiac MRI was ordered for viability study. Her Coreg was up-titrated for further optimization of GDMT. Cardiac MRI performed 04/13/20 showed viability in LAD territory and LVEF  33%.  Underwent subsequent coronary stent intervention 05/03/2020 with cardiac catheterization showing severe two-vessel coronary disease with diffuse LAD disease up to 70-80% significant by IVUS and CTO of RCA.  LVEDP was upper normal.  She underwent successful PCI to mid/distal LAD using 2 overlapping stents.  She was kept overnight for extended recovery and was recommended for DAPT for at least 6 months, ideally longer.  While hospitalized she was started on dapagliflozin, Lasix, spironolactone.  She was seen by Dr. Saunders Revel 07/15/2020 and noted marked worsening in her shortness of breath.  A bedside echocardiogram was performed showing evidence of large left pleural effusion.  She was subsequently canceled to the emergency department and admitted.  The etiology of the pleural effusion was unclear as fluid appeared exudative, cytology negative for malignancy.  She had marked improvement in her shortness of breath after 1.4 L of pleural fluid was removed via thoracentesis.  She had an echocardiogram 07/16/2020 with EF 25-30%, LV global hypokinesis with severe hypokinesis of the anterior septal wall and basal anterior wall, LV mildly dilated, RV normal size and function.  Presents today for follow up. Notes marked improvement in shortness of breath since thoracentesis. She has been up walking without difficulty. No chest pain, pressure, tightness. No lower extremity edema, she is wearing her compression stockings. She is excited to return to cardiac rehab.   EKGs/Labs/Other Studies Reviewed:   The following studies were reviewed today: 2D echo 07/16/2020: 1. Left ventricular ejection fraction, by estimation, is 25 to 30%. The  left ventricle has severely decreased function. The left ventricle  demonstrates global hypokinesis with severe hypokinesis of the  anterospetal wall and basal anterior wall. The left   ventricular internal cavity size was mildly dilated.   2. Right ventricular systolic function is  normal. The right ventricular  size is normal. Tricuspid regurgitation signal is inadequate for assessing  PA pressure.   Echo 11/29/19  1. Left ventricular ejection fraction, by estimation, is 25 to 30%. The  left ventricle has severely decreased function. The left ventricle  demonstrates global hypokinesis. The left ventricular internal cavity size  was mildly dilated. There is mild left  ventricular hypertrophy. Left ventricular diastolic parameters are  consistent with Grade I diastolic dysfunction (impaired relaxation).   2. Right ventricular systolic function is normal. The right ventricular  size is normal. Tricuspid regurgitation signal is inadequate for assessing  PA pressure.   3. Left atrial size was mildly dilated.   4. The inferior vena cava is normal in size with greater than 50%  respiratory variability, suggesting right atrial pressure of 3 mmHg.   R/LHC 12/02/19 Conclusions: 1. Significant two-vessel coronary artery disease with diffuse LAD disease of up to 70-80% in the mid/distal vessel and chronic subtotal occlusion of the mid RCA with left-to-right collaterals. 2. Moderately elevated left heart, right heart, and pulmonary artery pressures. 3. Low normal Fick cardiac output/index.   Recommendations: 1. Escalate diuresis and optimize evidence-based heart failure therapy. 2. Medical management of coronary artery disease.  Small vessel size and diffuse disease involving LAD and RCA make percutaneous and surgical revascularization suboptimal.  Coronary stent intervention 05/03/2020 Conclusions: 4. Severe two-vessel coronary artery disease with diffuse LAD disease of up to 70-80% that is significant by IVUS, as well as CTO of RCA. 5. Upper normal LVEDP. 6. Successful IVUS-guided PCI to mid/distal LAD using overlapping Resolute Onyx 2.5 x 38 mm and 2.25 x 34 mm drug-eluting stents with 0% residual stenosis and TIMI-3 flow.   Recommendations: 3. Overnight extended  recovery given multivessel CAD and LVEF < 35%. 4. Obtain CXR and UA/urine culture for further evaluation of leukocytosis. 5. Dual antiplatelet therapy with ASA and clopidogrel for at lest 6 months, ideally longer. 6. Recheck lipid panel in AM and escalate statin therapy, as tolerated, if LDL > 70.   EKG:  No EKG today.   Recent Labs: 11/28/2019: B Natriuretic Peptide 188.0 12/26/2019: ALT 20 07/15/2020: TSH 2.873 07/17/2020: BUN 19; Creatinine, Ser 0.56; Hemoglobin 13.9; Magnesium 2.4; Platelets 430; Potassium 3.8; Sodium 133  Recent Lipid Panel    Component Value Date/Time   CHOL 100 05/04/2020 0812   TRIG 64 05/04/2020 0812   HDL 39 (L) 05/04/2020 0812   CHOLHDL 2.6 05/04/2020 0812   VLDL 13 05/04/2020 0812   LDLCALC 48 05/04/2020 0812   LDLDIRECT 76.0 04/23/2019 1047    Home Medications   Current Meds  Medication Sig   aspirin EC 81 MG tablet Take 81 mg by mouth daily.   Calcium Carbonate-Vitamin D (CALCIUM 600+D) 600-400 MG-UNIT tablet Take 1 tablet by mouth daily.    carvedilol (COREG) 6.25 MG tablet TAKE 1.5 TABLETS (9.375 MG TOTAL) BY MOUTH 2 (TWO) TIMES DAILY WITH A MEAL.   cetirizine (ZYRTEC) 10 MG tablet Take 10 mg by mouth at bedtime.    clopidogrel (PLAVIX) 75 MG tablet Take 1 tablet (75 mg total) by mouth daily.   dapagliflozin propanediol (FARXIGA) 10 MG TABS tablet Take 1 tablet (10 mg total) by mouth daily.   ENTRESTO 49-51 MG TAKE 1 TABLET BY MOUTH TWICE A DAY   FLUoxetine (PROZAC) 20 MG capsule Take 1 capsule by mouth once daily   furosemide (LASIX) 40 MG tablet TAKE 1 TABLET BY MOUTH EVERY DAY   GRAPE SEED EXTRACT PO Take 1 capsule by mouth 2 (two) times daily.    Misc Natural Products (OSTEO BI-FLEX ADV JOINT SHIELD) TABS Take 1 tablet by mouth 2 (two) times daily.   Multiple Vitamin (MULTIVITAMIN WITH MINERALS) TABS tablet Take 1 tablet by mouth daily.   rosuvastatin (CRESTOR) 5 MG tablet Take 2 tablets (10 mg total) by mouth daily.    spironolactone (ALDACTONE) 25 MG tablet TAKE 1 TABLET BY MOUTH EVERY DAY    Review of Systems   All other systems reviewed and are otherwise negative except as noted above.  Physical Exam    VS:  BP 128/88 (BP Location: Left Arm, Patient Position: Sitting, Cuff Size: Normal)    Pulse 72    Ht 5\' 4"  (1.626 m)    Wt 188 lb (85.3 kg)    SpO2 95%    BMI 32.27 kg/m  , BMI Body mass index is 32.27 kg/m. GEN: Well nourished, overweight, well developed, in no acute distress. HEENT: normal. Neck: Supple, no JVD, carotid bruits, or masses. Cardiac: RRR, no murmurs, rubs, or gallops. No clubbing, cyanosis, edema.  Radials/DP/PT 2+ and equal bilaterally.  Respiratory:  Respirations regular and unlabored, clear to auscultation bilaterally. GI: Soft, nontender, nondistended, MS: No deformity or atrophy. Skin: Warm and dry, no rash.   Neuro:  Strength and sensation are intact.  Psych: Normal affect.  Assessment & Plan   1. Chronic HFrEF due to ICM-Echo 02/2020 with continued EF 25-30% and gr2DD. Underwent DESx2 to LAD 05/03/20.  Echo 07/15/20 EF 25-30%. NYHA I-II symptoms of dyspnea.  Present GDMT includes Coreg, Wilder Glade, Entresto, Lasix, Spironolactone. Provided her a note to allow her to resume her cardiac rehab.    2. CAD s/p 2 overlapping DES to LAD 05/03/2020- Reports no anginal symptoms. As her DOE has resolved with thoracentesis, no indication for ischemia evaluation. GDMT includes Aspirin, Coreg, Plavix, rosuvastatin every night  3. HLD -05/04/2020 LDL of 48.  Continue Rosuvastatin 5mg  daily due to myalgias with multiple statins in the past.   4. HTN - BP well controlled. Continue current antihypertensive regimen.   5. Large left pleural effusion s/p thoracentesis - Etiology unclear, cytology negative. Lung sounds clear on exam. Her breathing has significantly improved since her thoracentesis. No signs of recurrent pleural effusion  6. Leukocytosis / Thrombocytosis - Continue to follow with  PCP  Disposition: Follow up in 2 months with Darylene Price, NP at [facility] HF clinic and in 4 months with Dr. Saunders Revel.   Loel Dubonnet, NP 07/26/2020, 10:22 AM

## 2020-07-26 NOTE — Patient Instructions (Addendum)
Medication Instructions:  No medication changes today.   *If you need a refill on your cardiac medications before your next appointment, please call your pharmacy*  Lab Work: No lab work today.   Testing/Procedures: None ordered today.   Follow-Up: At [facility], you and your health needs are our priority.  As part of our continuing mission to provide you with exceptional heart care, we have created designated Provider Care Teams.  These Care Teams include your primary Cardiologist (physician) and Advanced Practice Providers (APPs -  Physician Assistants and Nurse Practitioners) who all work together to provide you with the care you need, when you need it.  We recommend signing up for the patient portal called "MyChart".  Sign up information is provided on this After Visit Summary.  MyChart is used to connect with patients for Virtual Visits (Telemedicine).  Patients are able to view lab/test results, encounter notes, upcoming appointments, etc.  Non-urgent messages can be sent to your provider as well.   To learn more about what you can do with MyChart, go to [email or web address].    Your next appointment:   In 2 months with Darylene Price, NP in the [facility]  In 4  month(s) in-person with Dr. Saunders Revel or Advanced Practice Provider on your designated Care Team:     Murray Hodgkins, NP  Christell Faith, PA-C  Marrianne Mood, PA-C  Cadence Kathlen Mody, [place]  Laurann Montana, NP  Other Instructions   Continue low salt, heart healthy diet.   Keep up the good work with cardiac rehab!  Continue to drink less than 2 liters of fluid per day.

## 2020-07-29 ENCOUNTER — Telehealth: Payer: Self-pay

## 2020-07-29 NOTE — Telephone Encounter (Signed)
[name] [name] plans to return to class tomorrow

## 2020-08-02 ENCOUNTER — Encounter: Payer: Medicare Other | Admitting: *Deleted

## 2020-08-02 ENCOUNTER — Other Ambulatory Visit: Payer: Self-pay

## 2020-08-02 DIAGNOSIS — Z955 Presence of coronary angioplasty implant and graft: Secondary | ICD-10-CM

## 2020-08-02 NOTE — Progress Notes (Signed)
Daily Session Note  Patient Details  Name: [name] [name] MRN: [id number] Date of Birth: May 28, 1951 Referring Provider:     Cardiac Rehab from 06/29/2020 in [facility] Cardiac [facility] Pulmonary Re[facility]  Referring Provider End      Encounter Date: 08/02/2020  Check In:  Session Check In - 08/02/20 1110      Check-In   Supervising physician immediately available to respond to emergencies See telemetry face sheet for immediately available ER MD    Location [facility]-Cardiac & Pulmonary Rehab    Staff Present Renita Papa, RN BSN;Joseph [address] [place], [place], ACSM CEP, Exercise Physiologist;Kara Eliezer Bottom, MS Exercise Physiologist    Virtual Visit No    Medication changes reported     No    Fall or balance concerns reported    No    Warm-up and Cool-down Performed on first and last piece of equipment    Resistance Training Performed Yes    VAD Patient? No    PAD/SET Patient? No      Pain Assessment   Currently in Pain? No/denies              Social History   Tobacco Use  Smoking Status Former Smoker  . Packs/day: 0.50  . Years: 10.00  . Pack years: 5.00  . Types: Cigarettes  Smokeless Tobacco Former Systems developer  . Quit date: 01/24/1986  Tobacco Comment   1989 quit     Goals Met:  Independence with exercise equipment Exercise tolerated well No report of cardiac concerns or symptoms Strength training completed today  Goals Unmet:  Not Applicable  Comments: Pt able to follow exercise prescription today without complaint.  Will continue to monitor for progression.    Dr. Emily Filbert is Medical Director for [facility] and [facility].

## 2020-08-03 ENCOUNTER — Ambulatory Visit: Payer: Medicare Other | Admitting: Family

## 2020-08-09 ENCOUNTER — Other Ambulatory Visit: Payer: Self-pay

## 2020-08-09 DIAGNOSIS — Z955 Presence of coronary angioplasty implant and graft: Secondary | ICD-10-CM | POA: Diagnosis not present

## 2020-08-09 NOTE — Progress Notes (Signed)
Daily Session Note  Patient Details  Name: [name] [name] MRN: [id number] Date of Birth: Oct 23, 1950 Referring Provider:     Cardiac Rehab from 06/29/2020 in [facility] Cardiac and Pulmonary Rehab  Referring Provider End      Encounter Date: 08/09/2020  Check In:  Session Check In - 08/09/20 1138      Check-In   Supervising physician immediately available to respond to emergencies See telemetry face sheet for immediately available ER MD    Location [facility]-Cardiac & Pulmonary Rehab    Staff Present Birdie Sons, MPA, Mauricia Area, BS, ACSM CEP, Exercise Physiologist;Joseph Alcus Dad, RN BSN    Virtual Visit No    Medication changes reported     No    Fall or balance concerns reported    No    Warm-up and Cool-down Performed on first and last piece of equipment    Resistance Training Performed Yes    VAD Patient? No    PAD/SET Patient? No      Pain Assessment   Currently in Pain? No/denies              Social History   Tobacco Use  Smoking Status Former Smoker  . Packs/day: 0.50  . Years: 10.00  . Pack years: 5.00  . Types: Cigarettes  Smokeless Tobacco Former Systems developer  . Quit date: 01/24/1986  Tobacco Comment   1989 quit     Goals Met:  Independence with exercise equipment Exercise tolerated well No report of cardiac concerns or symptoms Strength training completed today  Goals Unmet:  Not Applicable  Comments: Pt able to follow exercise prescription today without complaint.  Will continue to monitor for progression.    Dr. Emily Filbert is Medical Director for [facility] and [facility].

## 2020-08-11 ENCOUNTER — Encounter: Payer: Self-pay | Admitting: *Deleted

## 2020-08-11 ENCOUNTER — Encounter: Payer: Medicare Other | Attending: Internal Medicine

## 2020-08-11 DIAGNOSIS — Z955 Presence of coronary angioplasty implant and graft: Secondary | ICD-10-CM

## 2020-08-11 NOTE — Progress Notes (Signed)
Cardiac Individual Treatment Plan  Patient Details  Name: [name] [name] MRN: [id number] Date of Birth: 27-Feb-1951 Referring Provider:     Cardiac Rehab from 06/29/2020 in [facility] Cardiac and Pulmonary Rehab  Referring Provider End      Initial Encounter Date:    Cardiac Rehab from 06/29/2020 in [facility] Cardiac and Pulmonary Rehab  Date 06/29/20      Visit Diagnosis: Status post coronary artery stent placement  Patient's Home Medications on Admission:  Current Outpatient Medications:  .  aspirin EC 81 MG tablet, Take 81 mg by mouth daily., Disp: , Rfl:  .  Calcium Carbonate-Vitamin D (CALCIUM 600+D) 600-400 MG-UNIT tablet, Take 1 tablet by mouth daily. , Disp: , Rfl:  .  carvedilol (COREG) 6.25 MG tablet, TAKE 1.5 TABLETS (9.375 MG TOTAL) BY MOUTH 2 (TWO) TIMES DAILY WITH A MEAL., Disp: 270 tablet, Rfl: 0 .  cetirizine (ZYRTEC) 10 MG tablet, Take 10 mg by mouth at bedtime. , Disp: , Rfl:  .  clopidogrel (PLAVIX) 75 MG tablet, Take 1 tablet (75 mg total) by mouth daily., Disp: 30 tablet, Rfl: 6 .  dapagliflozin propanediol (FARXIGA) 10 MG TABS tablet, Take 1 tablet (10 mg total) by mouth daily., Disp: 90 tablet, Rfl: 3 .  ENTRESTO 49-51 MG, TAKE 1 TABLET BY MOUTH TWICE A DAY, Disp: 60 tablet, Rfl: 5 .  FLUoxetine (PROZAC) 20 MG capsule, Take 1 capsule by mouth once daily, Disp: 90 capsule, Rfl: 0 .  furosemide (LASIX) 40 MG tablet, TAKE 1 TABLET BY MOUTH EVERY DAY, Disp: 90 tablet, Rfl: 1 .  GRAPE SEED EXTRACT PO, Take 1 capsule by mouth 2 (two) times daily. , Disp: , Rfl:  .  Misc Natural Products (OSTEO BI-FLEX ADV JOINT SHIELD) TABS, Take 1 tablet by mouth 2 (two) times daily., Disp: , Rfl:  .  Multiple Vitamin (MULTIVITAMIN WITH MINERALS) TABS tablet, Take 1 tablet by mouth daily., Disp: , Rfl:  .  rosuvastatin (CRESTOR) 5 MG tablet, Take 2 tablets (10 mg total) by mouth daily., Disp: 90 tablet, Rfl: 3 .  spironolactone (ALDACTONE) 25 MG tablet, TAKE 1 TABLET BY MOUTH EVERY DAY,  Disp: 90 tablet, Rfl: 0  Past Medical History: Past Medical History:  Diagnosis Date  . Allergic rhinitis   . Avulsion fracture of distal fibula 3/10   and sprain; Left  . Breast cancer [facility]) 2004   Left Breast Cancer  . CHF (congestive heart failure) ([place])   . Coronary artery disease    a. 10/2012 ETT: nl; b. 12/2012 Cath: LM nl, LAD min irregs, D1 min irregs, LCX 30p, RCA 2m RPDA 40p, EF 50%.  . Diastolic dysfunction    a. 12/2012 Echo: EF 50-55%. No rwma. Gr1 DD. Nl RV fxn. Nl PASP.  .[name] KitchenHistory of hepatitis B    ?--tested + by [facility] . HLD (hyperlipidemia)   . HTN (hypertension)   . Osteoarthritis   . Osteopenia     Tobacco Use: Social History   Tobacco Use  Smoking Status Former Smoker  . Packs/day: 0.50  . Years: 10.00  . Pack years: 5.00  . 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 Quit date: 01/24/1986  Tobacco Comment   1989 quit     Labs: Recent Review Flowsheet Data    Labs for ITP Cardiac and Pulmonary Rehab Latest Ref Rng & Units 12/06/2017 12/19/2017 04/23/2019 11/29/2019 05/04/2020   Cholestrol 0 - 200 mg/dL 252(H) - 330(H) 155 100   LDLCALC 0 -  99 mg/dL - - - 87 48   LDLDIRECT mg/dL 60.0 - 76.0 - -   HDL >40 mg/dL 38.60(L) - 41.20 35(L) 39(L)   Trlycerides <150 mg/dL 399.0(H) - 439.0 Triglyceride is over 400; calculations on Lipids are invalid.(H) 165(H) 64   Hemoglobin A1c 4.6 - 6.5 % - 5.9 5.7 - -       Exercise Target Goals: Exercise Program Goal: Individual exercise prescription set using results from initial 6 min walk test and THRR while considering  patient's activity barriers and safety.   Exercise Prescription Goal: Initial exercise prescription builds to 30-45 minutes a day of aerobic activity, 2-3 days per week.  Home exercise guidelines will be given to patient during program as part of exercise prescription that the participant will acknowledge.   Education: Aerobic Exercise: - Group verbal and visual presentation on the  components of exercise prescription. Introduces F.I.T.T principle from ACSM for exercise prescriptions.  Reviews F.I.T.T. principles of aerobic exercise including progression. Written material given at graduation.   Cardiac Rehab from 07/07/2020 in [facility] Cardiac and Pulmonary Rehab  Date 07/07/20  [facility] 07/07/20,  Aerobic 06/30/20]  Educator [facility]  Instruction Review Code 1- Verbalizes Understanding      Education: Resistance Exercise: - Group verbal and visual presentation on the components of exercise prescription. Introduces F.I.T.T principle from ACSM for exercise prescriptions  Reviews F.I.T.T. principles of resistance exercise including progression. Written material given at graduation.    Education: Exercise & Equipment Safety: - Individual verbal instruction and demonstration of equipment use and safety with use of the equipment.   Cardiac Rehab from 07/07/2020 in [facility] Cardiac and Pulmonary Rehab  Date 06/29/20  Educator AS  Instruction Review Code 1- Verbalizes Understanding      Education: Exercise Physiology & General Exercise Guidelines: - Group verbal and written instruction with models to review the exercise physiology of the cardiovascular system and associated critical values. Provides general exercise guidelines with specific guidelines to those with heart or lung disease.    Education: Flexibility, Balance, Mind/Body Relaxation: - Group verbal and visual presentation with interactive activity on the components of exercise prescription. Introduces F.I.T.T principle from ACSM for exercise prescriptions. Reviews F.I.T.T. principles of flexibility and balance exercise training including progression. Also discusses the mind body connection.  Reviews various relaxation techniques to help reduce and manage stress (i.e. Deep breathing, progressive muscle relaxation, and visualization). Balance handout provided to take home. Written material given at graduation.   Activity  Barriers & Risk Stratification:  Activity Barriers & Cardiac Risk Stratification - 06/25/20 1304      Activity Barriers & Cardiac Risk Stratification   Activity Barriers Right Hip Replacement;Shortness of Breath    Cardiac Risk Stratification High           6 Minute Walk:  6 Minute Walk    Row Name 06/29/20 1443         6 Minute Walk   Phase Initial     Distance 700 feet     Walk Time 4.5 minutes     # of Rest Breaks 3     MPH 1.76     METS 1.92     RPE 15     Perceived Dyspnea  3     VO2 Peak 6.7     Symptoms Yes (comment)     Comments SOB     Resting HR 82 bpm     Resting BP 102/64     Resting Oxygen Saturation  94 %  Exercise Oxygen Saturation  during 6 min walk 94 %     Max Ex. HR 111 bpm     Max Ex. BP 152/74     2 Minute Post BP 104/68            Oxygen Initial Assessment:   Oxygen Re-Evaluation:   Oxygen Discharge (Final Oxygen Re-Evaluation):   Initial Exercise Prescription:  Initial Exercise Prescription - 06/29/20 1400      Date of Initial Exercise RX and Referring Provider   Date 06/29/20    Referring Provider End      Treadmill   MPH 1.2    Grade 0    Minutes 15    METs 1.9      Recumbant Bike   Level 1    RPM 60    Minutes 15    METs 1.9      NuStep   Level 1    SPM 80    Minutes 15    METs 1.9      REL-XR   Level 1    Speed 50    Minutes 15    METs 1.9      T5 Nustep   Level 1    SPM 80    Minutes 15    METs 1.9      Prescription Details   Frequency (times per week) 3    Duration Progress to 30 minutes of continuous aerobic without signs/symptoms of physical distress      Intensity   THRR 40-80% of Max Heartrate 110-137    Ratings of Perceived Exertion 11-15    Perceived Dyspnea 0-4           Perform Capillary Blood Glucose checks as needed.  Exercise Prescription Changes:  Exercise Prescription Changes    Row Name 06/29/20 1400 07/07/20 1700 08/02/20 1700         Response to Exercise   Blood  Pressure (Admit) 102/64 124/80 98/64     Blood Pressure (Exercise) 152/74 158/108 114/80     Blood Pressure (Exit) 104/68 122/76 100/58     Heart Rate (Admit) 82 bpm 104 bpm 80 bpm     Heart Rate (Exercise) 111 bpm 133 bpm 115 bpm     Heart Rate (Exit) 84 bpm 103 bpm 88 bpm     Oxygen Saturation (Admit) 94 % -- --     Oxygen Saturation (Exercise) 94 % -- --     Rating of Perceived Exertion (Exercise) 15 15 13      Perceived Dyspnea (Exercise) 3 -- --     Symptoms SOB none none     Duration -- Progress to 30 minutes of  aerobic without signs/symptoms of physical distress Progress to 30 minutes of  aerobic without signs/symptoms of physical distress     Intensity -- THRR unchanged THRR unchanged       Progression   Progression -- Continue to progress workloads to maintain intensity without signs/symptoms of physical distress. Continue to progress workloads to maintain intensity without signs/symptoms of physical distress.     Average METs -- 1.92 2.46       Resistance Training   Weight -- 3 3     Reps -- 10-15 10-15       Interval Training   Interval Training -- No No       Treadmill   MPH -- 1.2 1.3     Grade -- 0 0     Minutes -- 15 15  METs -- 1.9 1.92       REL-XR   Level -- 1 1     Speed -- 50 50     Minutes -- 15 15     METs -- -- 3            Exercise Comments:   Exercise Goals and Review:  Exercise Goals    Row Name 06/29/20 1459             Exercise Goals   Increase Physical Activity Yes       Intervention Provide advice, education, support and counseling about physical activity/exercise needs.;Develop an individualized exercise prescription for aerobic and resistive training based on initial evaluation findings, risk stratification, comorbidities and participant's personal goals.       Expected Outcomes Short Term: Attend rehab on a regular basis to increase amount of physical activity.;Long Term: Add in home exercise to make exercise part of routine  and to increase amount of physical activity.;Long Term: Exercising regularly at least 3-5 days a week.       Increase Strength and Stamina Yes       Intervention Provide advice, education, support and counseling about physical activity/exercise needs.;Develop an individualized exercise prescription for aerobic and resistive training based on initial evaluation findings, risk stratification, comorbidities and participant's personal goals.       Expected Outcomes Short Term: Increase workloads from initial exercise prescription for resistance, speed, and METs.;Short Term: Perform resistance training exercises routinely during rehab and add in resistance training at home;Long Term: Improve cardiorespiratory fitness, muscular endurance and strength as measured by increased METs and functional capacity (6MWT)       Able to understand and use rate of perceived exertion (RPE) scale Yes       Intervention Provide education and explanation on how to use RPE scale       Expected Outcomes Short Term: Able to use RPE daily in rehab to express subjective intensity level;Long Term:  Able to use RPE to guide intensity level when exercising independently       Able to understand and use Dyspnea scale Yes       Intervention Provide education and explanation on how to use Dyspnea scale       Expected Outcomes Short Term: Able to use Dyspnea scale daily in rehab to express subjective sense of shortness of breath during exertion;Long Term: Able to use Dyspnea scale to guide intensity level when exercising independently       Knowledge and understanding of Target Heart Rate Range (THRR) Yes       Intervention Provide education and explanation of THRR including how the numbers were predicted and where they are located for reference       Expected Outcomes Short Term: Able to state/look up THRR;Short Term: Able to use daily as guideline for intensity in rehab;Long Term: Able to use THRR to govern intensity when exercising  independently       Able to check pulse independently Yes       Intervention Provide education and demonstration on how to check pulse in carotid and radial arteries.;Review the importance of being able to check your own pulse for safety during independent exercise       Expected Outcomes Short Term: Able to explain why pulse checking is important during independent exercise;Long Term: Able to check pulse independently and accurately       Understanding of Exercise Prescription Yes       Intervention Provide education, explanation,  and written materials on patient's individual exercise prescription       Expected Outcomes Short Term: Able to explain program exercise prescription;Long Term: Able to explain home exercise prescription to exercise independently              Exercise Goals Re-Evaluation :  Exercise Goals Re-Evaluation    Row Name 06/30/20 1133 07/07/20 1715 07/21/20 1438 08/02/20 1118       Exercise Goal Re-Evaluation   Exercise Goals Review Increase Physical Activity;Able to understand and use rate of perceived exertion (RPE) scale;Knowledge and understanding of Target Heart Rate Range (THRR);Understanding of Exercise Prescription;Increase Strength and Stamina;Able to check pulse independently Increase Physical Activity;Increase Strength and Stamina -- Increase Physical Activity;Increase Strength and Stamina    Comments Reviewed RPE and dyspnea scales, THR and program prescription with pt today.  Pt voiced understanding and was given a copy of goals to take home. Debbie tolerates exercise well.  She works in [employer] range and RPE 15.  Staff will monitor progress. Out since last review (hospitalized) Patient was recently disharged from the hospital as she had a thoracentesis completed due to her significant SOB. An Exercise Physiologist will review home exercise with patient when ready. Will provide patient time to recover from hospital until review. She did walk to rehab today and felt  good doing it. She also walked over the weekend while shopping and did not have to pause or take breaks. Jackelyn Poling is pleased. She does have an elliptical at home that she will plan to use for home exercise.    Expected Outcomes Short: Use RPE daily to regulate intensity. Long: Follow program prescription in THR. Short:  attend consistently Long:  increase overall MET level -- Short: Review home exercise Long: Be able to exercise independently at home with no complications           Discharge Exercise Prescription (Final Exercise Prescription Changes):  Exercise Prescription Changes - 08/02/20 1700      Response to Exercise   Blood Pressure (Admit) 98/64    Blood Pressure (Exercise) 114/80    Blood Pressure (Exit) 100/58    Heart Rate (Admit) 80 bpm    Heart Rate (Exercise) 115 bpm    Heart Rate (Exit) 88 bpm    Rating of Perceived Exertion (Exercise) 13    Symptoms none    Duration Progress to 30 minutes of  aerobic without signs/symptoms of physical distress    Intensity THRR unchanged      Progression   Progression Continue to progress workloads to maintain intensity without signs/symptoms of physical distress.    Average METs 2.46      Resistance Training   Weight 3    Reps 10-15      Interval Training   Interval Training No      Treadmill   MPH 1.3    Grade 0    Minutes 15    METs 1.92      REL-XR   Level 1    Speed 50    Minutes 15    METs 3           Nutrition:  Target Goals: Understanding of nutrition guidelines, daily intake of sodium <1525m, cholesterol <2050m calories 30% from fat and 7% or less from saturated fats, daily to have 5 or more servings of fruits and vegetables.  Education: All About Nutrition: -Group instruction provided by verbal, written material, interactive activities, discussions, models, and posters to present general guidelines for heart healthy nutrition  including fat, fiber, MyPlate, the role of sodium in heart healthy nutrition,  utilization of the nutrition label, and utilization of this knowledge for meal planning. Follow up email sent as well. Written material given at graduation.   Biometrics:  Pre Biometrics - 06/29/20 1500      Pre Biometrics   Height 5' 5"  (1.651 m)    Weight 190 lb 11.2 oz (86.5 kg)    BMI (Calculated) 31.73    Single Leg Stand 6.87 seconds            Nutrition Therapy Plan and Nutrition Goals:  Nutrition Therapy & Goals - 07/06/20 1018      Nutrition Therapy   Fiber 25 grams    Whole Grain Foods 3 servings    Saturated Fats 12 max. grams    Fruits and Vegetables 8 servings/day    Sodium 1.5 grams      Personal Nutrition Goals   Nutrition Goal LT:    Comments Poor phone connection, rescheduled appointment.           Nutrition Assessments:  Nutrition Assessments - 06/29/20 1502      MEDFICTS Scores   Pre Score 27          MEDIFICTS Score Key:  ?70 Need to make dietary changes   40-70 Heart Healthy Diet  ? 40 Therapeutic Level Cholesterol Diet   Picture Your Plate Scores:  <07 Unhealthy dietary pattern with much room for improvement.  41-50 Dietary pattern unlikely to meet recommendations for good health and room for improvement.  51-60 More healthful dietary pattern, with some room for improvement.   >60 Healthy dietary pattern, although there may be some specific behaviors that could be improved.    Nutrition Goals Re-Evaluation:  Nutrition Goals Re-Evaluation    [place] Name 08/02/20 1315             Goals   Comment Jackelyn Poling has not completed her initial assessment with the RD yet, but will get an appointment with her soon as she has been in the hospital.       Expected Outcome Short: Meet with RD for initial evaluation Long: Achieve goals provided by RD              Nutrition Goals Discharge (Final Nutrition Goals Re-Evaluation):  Nutrition Goals Re-Evaluation - 08/02/20 1315      Goals   Comment Debbie has not completed her initial  assessment with the RD yet, but will get an appointment with her soon as she has been in the hospital.    Expected Outcome Short: Meet with RD for initial evaluation Long: Achieve goals provided by RD           Psychosocial: Target Goals: Acknowledge presence or absence of significant depression and/or stress, maximize coping skills, provide positive support system. Participant is able to verbalize types and ability to use techniques and skills needed for reducing stress and depression.   Education: Stress, Anxiety, and Depression - Group verbal and visual presentation to define topics covered.  Reviews how body is impacted by stress, anxiety, and depression.  Also discusses healthy ways to reduce stress and to treat/manage anxiety and depression.  Written material given at graduation.   Education: Sleep Hygiene -Provides group verbal and written instruction about how sleep can affect your health.  Define sleep hygiene, discuss sleep cycles and impact of sleep habits. Review good sleep hygiene tips.    Initial Review & Psychosocial Screening:  Initial Psych Review & Screening -  06/25/20 1307      Initial Review   Current issues with None Identified      Family Dynamics   Good Support System? Yes      Barriers   Psychosocial barriers to participate in program There are no identifiable barriers or psychosocial needs.;The patient should benefit from training in stress management and relaxation.      Screening Interventions   Interventions Encouraged to exercise;Provide feedback about the scores to participant;To provide support and resources with identified psychosocial needs    Expected Outcomes Short Term goal: Utilizing psychosocial counselor, staff and physician to assist with identification of specific Stressors or current issues interfering with healing process. Setting desired goal for each stressor or current issue identified.;Long Term Goal: Stressors or current issues are  controlled or eliminated.;Short Term goal: Identification and review with participant of any Quality of Life or Depression concerns found by scoring the questionnaire.;Long Term goal: The participant improves quality of Life and PHQ9 Scores as seen by post scores and/or verbalization of changes           Quality of Life Scores:   Quality of Life - 06/29/20 1502      Quality of Life   Select Quality of Life      Quality of Life Scores   Health/Function Pre 18.88 %    Socioeconomic Pre 24.58 %    Psych/Spiritual Pre 24 %    Family Pre 19.67 %    GLOBAL Pre 21.38 %          Scores of 19 and below usually indicate a poorer quality of life in these areas.  A difference of  2-3 points is a clinically meaningful difference.  A difference of 2-3 points in the total score of the Quality of Life Index has been associated with significant improvement in overall quality of life, self-image, physical symptoms, and general health in studies assessing change in quality of life.  PHQ-9: Recent Review Flowsheet Data    Depression screen [facility] 2/9 08/02/2020 06/29/2020 08/19/2019 12/06/2017   Decreased Interest 0 1 1 1    Down, Depressed, Hopeless 0 1 1 1    PHQ - 2 Score 0 2 2 2    Altered sleeping 0 0 1 1   Tired, decreased energy 1 3  1 1    Change in appetite 1 1 1 1    Feeling bad or failure about yourself  0 1 0 0   Trouble concentrating 1 0 0 0   Moving slowly or fidgety/restless 0 0 0 0   Suicidal thoughts 0 0 0 0   PHQ-9 Score 3 7 5 5    Difficult doing work/chores Somewhat difficult - Not difficult at all Not difficult at all     Interpretation of Total Score  Total Score Depression Severity:  1-4 = Minimal depression, 5-9 = Mild depression, 10-14 = Moderate depression, 15-19 = Moderately severe depression, 20-27 = Severe depression   Psychosocial Evaluation and Intervention:  Psychosocial Evaluation - 06/25/20 1312      Psychosocial Evaluation & Interventions   Interventions  Encouraged to exercise with the program and follow exercise prescription    Comments Jackelyn Poling reports doing okay post stent. She is really struggling with her shortness of breath and her stamina. She hopes to improve with coming to rehab so she can walk her dog and around the store. She states she is a pretty easy going person with little stress. A family that needed a place to stay currently is staying  with her and keeps her company.    Expected Outcomes Short: attend cardiac rehab for education and exercise. Long: develop positive self care habits.    Continue Psychosocial Services  Follow up required by staff           Psychosocial Re-Evaluation:  Psychosocial Re-Evaluation    [place] Name 08/02/20 1135             Psychosocial Re-Evaluation   Current issues with None Identified       Comments Jackelyn Poling denies any concerns at this time. She reports she sleeps well and is currently taking  Fluoxetine, more for hot flashses, but stays compliant and feels it helps her. She enjoys exercising and can already tell her stamina has increased. Her biggest stressor was her SOB, however, she recently had a thoracentsis completed and reports feeling better and being able to complete her tasks at home. Her PHQ improved from a 7 to a 3!       Expected Outcomes Short: Continue attending rehab Long: Maintain positive attitude and utilize exercise for stress management       Interventions Encouraged to attend Cardiac Rehabilitation for the exercise       Continue Psychosocial Services  Follow up required by staff              Psychosocial Discharge (Final Psychosocial Re-Evaluation):  Psychosocial Re-Evaluation - 08/02/20 1135      Psychosocial Re-Evaluation   Current issues with None Identified    Comments Jackelyn Poling denies any concerns at this time. She reports she sleeps well and is currently taking  Fluoxetine, more for hot flashses, but stays compliant and feels it helps her. She enjoys exercising and can  already tell her stamina has increased. Her biggest stressor was her SOB, however, she recently had a thoracentsis completed and reports feeling better and being able to complete her tasks at home. Her PHQ improved from a 7 to a 3!    Expected Outcomes Short: Continue attending rehab Long: Maintain positive attitude and utilize exercise for stress management    Interventions Encouraged to attend Cardiac Rehabilitation for the exercise    Continue Psychosocial Services  Follow up required by staff           Vocational Rehabilitation: Provide vocational rehab assistance to qualifying candidates.   Vocational Rehab Evaluation & Intervention:  Vocational Rehab - 06/25/20 1306      Initial Vocational Rehab Evaluation & Intervention   Assessment shows need for Vocational Rehabilitation No           Education: Education Goals: Education classes will be provided on a variety of topics geared toward better understanding of heart health and risk factor modification. Participant will state understanding/return demonstration of topics presented as noted by education test scores.  Learning Barriers/Preferences:  Learning Barriers/Preferences - 06/25/20 1306      Learning Barriers/Preferences   Learning Barriers None    Learning Preferences None           General Cardiac Education Topics:  AED/CPR: - Group verbal and written instruction with the use of models to demonstrate the basic use of the AED with the basic ABC's of resuscitation.   Anatomy and Cardiac Procedures: - Group verbal and visual presentation and models provide information about basic cardiac anatomy and function. Reviews the testing methods done to diagnose heart disease and the outcomes of the test results. Describes the treatment choices: Medical Management, Angioplasty, or Coronary Bypass Surgery for treating various heart conditions including  Myocardial Infarction, Angina, Valve Disease, and Cardiac Arrhythmias.   Written material given at graduation.   Cardiac Rehab from 07/07/2020 in [facility] Cardiac and Pulmonary Rehab  Date 07/07/20  Educator [facility]  Instruction Review Code 1- Verbalizes Understanding      Medication Safety: - Group verbal and visual instruction to review commonly prescribed medications for heart and lung disease. Reviews the medication, class of the drug, and side effects. Includes the steps to properly store meds and maintain the prescription regimen.  Written material given at graduation.   Intimacy: - Group verbal instruction through game format to discuss how heart and lung disease can affect sexual intimacy. Written material given at graduation..   Cardiac Rehab from 07/07/2020 in [facility] Cardiac and Pulmonary Rehab  Date 06/30/20  Educator [facility]  Instruction Review Code 1- Verbalizes Understanding      Know Your Numbers and Heart Failure: - Group verbal and visual instruction to discuss disease risk factors for cardiac and pulmonary disease and treatment options.  Reviews associated critical values for Overweight/Obesity, Hypertension, Cholesterol, and Diabetes.  Discusses basics of heart failure: signs/symptoms and treatments.  Introduces Heart Failure Zone chart for action plan for heart failure.  Written material given at graduation.   Infection Prevention: - Provides verbal and written material to individual with discussion of infection control including proper hand washing and proper equipment cleaning during exercise session.   Cardiac Rehab from 07/07/2020 in [facility] Cardiac and Pulmonary Rehab  Date 06/29/20  Educator AS  Instruction Review Code 1- Verbalizes Understanding      Falls Prevention: - Provides verbal and written material to individual with discussion of falls prevention and safety.   Cardiac Rehab from 07/07/2020 in [facility] Cardiac and Pulmonary Rehab  Date 06/29/20  Educator AS  Instruction Review Code 1- Verbalizes Understanding      Other: -Provides  group and verbal instruction on various topics (see comments)   Knowledge Questionnaire Score:  Knowledge Questionnaire Score - 06/29/20 1501      Knowledge Questionnaire Score   Pre Score 19/26 exercise, nutrition           Core Components/Risk Factors/Patient Goals at Admission:  Personal Goals and Risk Factors at Admission - 06/29/20 1504      Core Components/Risk Factors/Patient Goals on Admission    Weight Management Yes    Intervention Weight Management: Develop a combined nutrition and exercise program designed to reach desired caloric intake, while maintaining appropriate intake of nutrient and fiber, sodium and fats, and appropriate energy expenditure required for the weight goal.;Weight Management: Provide education and appropriate resources to help participant work on and attain dietary goals.    Admit Weight 190 lb 11.2 oz (86.5 kg)    Goal Weight: Short Term 185 lb (83.9 kg)    Goal Weight: Long Term 180 lb (81.6 kg)    Expected Outcomes Short Term: Continue to assess and modify interventions until short term weight is achieved;Long Term: Adherence to nutrition and physical activity/exercise program aimed toward attainment of established weight goal;Weight Maintenance: Understanding of the daily nutrition guidelines, which includes 25-35% calories from fat, 7% or less cal from saturated fats, less than 219m cholesterol, less than 1.5gm of sodium, & 5 or more servings of fruits and vegetables daily;Understanding recommendations for meals to include 15-35% energy as protein, 25-35% energy from fat, 35-60% energy from carbohydrates, less than 20110mof dietary cholesterol, 20-35 gm of total fiber daily;Understanding of distribution of calorie intake throughout the day with the consumption of 4-5  meals/snacks;Weight Gain: Understanding of general recommendations for a high calorie, high protein meal plan that promotes weight gain by distributing calorie intake throughout the day with  the consumption for 4-5 meals, snacks, and/or supplements    Heart Failure Yes    Intervention Provide a combined exercise and nutrition program that is supplemented with education, support and counseling about heart failure. Directed toward relieving symptoms such as shortness of breath, decreased exercise tolerance, and extremity edema.    Expected Outcomes Improve functional capacity of life;Short term: Attendance in program 2-3 days a week with increased exercise capacity. Reported lower sodium intake. Reported increased fruit and vegetable intake. Reports medication compliance.;Short term: Daily weights obtained and reported for increase. Utilizing diuretic protocols set by physician.;Long term: Adoption of self-care skills and reduction of barriers for early signs and symptoms recognition and intervention leading to self-care maintenance.    Hypertension Yes    Intervention Provide education on lifestyle modifcations including regular physical activity/exercise, weight management, moderate sodium restriction and increased consumption of fresh fruit, vegetables, and low fat dairy, alcohol moderation, and smoking cessation.;Monitor prescription use compliance.    Expected Outcomes Short Term: Continued assessment and intervention until BP is < 140/80m HG in hypertensive participants. < 130/860mHG in hypertensive participants with diabetes, heart failure or chronic kidney disease.;Long Term: Maintenance of blood pressure at goal levels.    Lipids Yes    Intervention Provide education and support for participant on nutrition & aerobic/resistive exercise along with prescribed medications to achieve LDL <7082mHDL >40m20m  Expected Outcomes Short Term: Participant states understanding of desired cholesterol values and is compliant with medications prescribed. Participant is following exercise prescription and nutrition guidelines.;Long Term: Cholesterol controlled with medications as prescribed, with  individualized exercise RX and with personalized nutrition plan. Value goals: LDL < 70mg6mL > 40 mg.           Education:Diabetes - Individual verbal and written instruction to review signs/symptoms of diabetes, desired ranges of glucose level fasting, after meals and with exercise. Acknowledge that pre and post exercise glucose checks will be done for 3 sessions at entry of program.   Core Components/Risk Factors/Patient Goals Review:   Goals and Risk Factor Review    Row Name 08/02/20 1122             Core Components/Risk Factors/Patient Goals Review   Personal Goals Review Heart Failure;Hypertension;Lipids;Weight Management/Obesity       Review DebbiJackelyn Polingntly was discharged from the hospital as she had 1.4 L of fluid removed. She reports she feels much better and was able to walk while shopping over the weekend with no SOB. Denies symptoms with Heart Failure. Weight has been stable at maintained around 188 lbs. She does weigh herself every morning and knows to monitor significant weight gain. She does not take her BP at home but does have a cuff to use which she will start to monitor. At doctor appointments and rehab, her BP has been stable. Stays compliant with all her medications.       Expected Outcomes Short: Starting taking BP at home/ keep a log Long: Continue to manage lifestyle factors              Core Components/Risk Factors/Patient Goals at Discharge (Final Review):   Goals and Risk Factor Review - 08/02/20 1122      Core Components/Risk Factors/Patient Goals Review   Personal Goals Review Heart Failure;Hypertension;Lipids;Weight Management/Obesity    Review DebbiJackelyn Polingntly was discharged from the  hospital as she had 1.4 L of fluid removed. She reports she feels much better and was able to walk while shopping over the weekend with no SOB. Denies symptoms with Heart Failure. Weight has been stable at maintained around 188 lbs. She does weigh herself every morning and  knows to monitor significant weight gain. She does not take her BP at home but does have a cuff to use which she will start to monitor. At doctor appointments and rehab, her BP has been stable. Stays compliant with all her medications.    Expected Outcomes Short: Starting taking BP at home/ keep a log Long: Continue to manage lifestyle factors           ITP Comments:  ITP Comments    Row Name 06/25/20 1301 06/29/20 1514 06/30/20 1133 07/14/20 0642 07/21/20 1437   ITP Comments Initial telephone encounter completed. Diagnosis can be found CHL 8/23. EP orientation scheduled for 10/19 at 1:30 Completed 6MWT and gym orientation. Initial ITP created and sent for review to Dr. Emily Filbert, Medical Director. First full day of exercise!  Patient was oriented to gym and equipment including functions, settings, policies, and procedures.  Patient's individual exercise prescription and treatment plan were reviewed.  All starting workloads were established based on the results of the 6 minute walk test done at initial orientation visit.  The plan for exercise progression was also introduced and progression will be customized based on patient's performance and goals 30 Day review completed. Medical Director ITP review done, changes made as directed, and signed approval by Medical Director. Has been out with hospitalization.  Has follow up appt on 11/15.   [place] Name 08/11/20 0946           ITP Comments 30 Day review completed. Medical Director ITP review done, changes made as directed, and signed approval by Medical Director.              Comments:

## 2020-08-19 ENCOUNTER — Other Ambulatory Visit: Payer: PRIVATE HEALTH INSURANCE

## 2020-08-19 ENCOUNTER — Ambulatory Visit: Payer: PRIVATE HEALTH INSURANCE

## 2020-08-23 ENCOUNTER — Other Ambulatory Visit: Payer: Self-pay

## 2020-08-23 ENCOUNTER — Encounter: Payer: Medicare Other | Admitting: *Deleted

## 2020-08-23 DIAGNOSIS — Z955 Presence of coronary angioplasty implant and graft: Secondary | ICD-10-CM

## 2020-08-23 NOTE — Progress Notes (Signed)
Daily Session Note  Patient Details  Name: AILA TERRA MRN: [id number] Date of Birth: April 10, 1951 Referring Provider:   Flowsheet Row Cardiac Rehab from 06/29/2020 in [facility] Cardiac and Pulmonary Rehab  Referring Provider End      Encounter Date: 08/23/2020  Check In:  Session Check In - 08/23/20 1113      Check-In   Supervising physician immediately available to respond to emergencies See telemetry face sheet for immediately available ER MD    Location [facility]-Cardiac & Pulmonary Rehab    Staff Present Renita Papa, RN BSN;Joseph [address] [place], [place], ACSM CEP, Exercise Physiologist;Kara Eliezer Bottom, MS Exercise Physiologist    Virtual Visit No    Medication changes reported     No    Fall or balance concerns reported    No    Warm-up and Cool-down Performed on first and last piece of equipment    Resistance Training Performed Yes    VAD Patient? No      Pain Assessment   Currently in Pain? No/denies              Social History   Tobacco Use  Smoking Status Former Smoker   Packs/day: 0.50   Years: 10.00   Pack years: 5.00   Types: Cigarettes  Smokeless Tobacco Former Systems developer   Quit date: 01/24/1986  Tobacco Comment   1989 quit     Goals Met:  Independence with exercise equipment Changing diet to healthy choices, watching portion sizes No report of cardiac concerns or symptoms Strength training completed today  Goals Unmet:  Not Applicable  Comments: Pt able to follow exercise prescription today without complaint.  Will continue to monitor for progression.    Dr. Emily Filbert is Medical Director for [place] [facility].

## 2020-08-24 ENCOUNTER — Other Ambulatory Visit: Payer: Self-pay | Admitting: Internal Medicine

## 2020-08-24 NOTE — Telephone Encounter (Signed)
Rx request sent to pharmacy.  

## 2020-08-25 ENCOUNTER — Other Ambulatory Visit: Payer: Self-pay

## 2020-08-25 ENCOUNTER — Encounter: Payer: Medicare Other | Admitting: *Deleted

## 2020-08-25 ENCOUNTER — Other Ambulatory Visit: Payer: Self-pay | Admitting: Internal Medicine

## 2020-08-25 DIAGNOSIS — Z955 Presence of coronary angioplasty implant and graft: Secondary | ICD-10-CM

## 2020-08-25 MED ORDER — ROSUVASTATIN CALCIUM 5 MG PO TABS
10.0000 mg | ORAL_TABLET | Freq: Every day | ORAL | 3 refills | Status: DC
Start: 2020-08-25 — End: 2020-08-26

## 2020-08-25 NOTE — Progress Notes (Signed)
Daily Session Note  Patient Details  Name: [name] [name] MRN: [id number] Date of Birth: [birth year] Referring Provider:   Flowsheet Row Cardiac Rehab from 06/29/2020 in [facility] Cardiac and Pulmonary Rehab  Referring Provider End      Encounter Date: 08/25/2020  Check In:  Session Check In - 08/25/20 1143      Check-In   Supervising physician immediately available to respond to emergencies See telemetry face sheet for immediately available ER MD    Location [facility]-Cardiac & Pulmonary Rehab    Staff Present Nada Maclachlan, BA, ACSM CEP, Exercise Physiologist;Melissa Caiola RDN, LDN;Joseph  [place];Heath Lark, RN, BSN, CCRP    Virtual Visit No    Medication changes reported     No    Fall or balance concerns reported    No    Warm-up and Cool-down Performed on first and last piece of equipment    Resistance Training Performed Yes    VAD Patient? No    PAD/SET Patient? No      VAD patient   Has back up controller? No      Pain Assessment   Currently in Pain? No/denies              Social History   Tobacco Use  Smoking Status Former Smoker  . Packs/day: 0.50  . Years: 10.00  . Pack years: 5.00  . Types: Cigarettes  Smokeless Tobacco Former Systems developer  . Quit date: 01/24/1986  Tobacco Comment   1989 quit     Goals Met:  Independence with exercise equipment Exercise tolerated well No report of cardiac concerns or symptoms  Goals Unmet:  Not Applicable  Comments: Pt able to follow exercise prescription today without complaint.  Will continue to monitor for progression.    Dr. Emily Filbert is Medical Director for [place] [facility].

## 2020-08-26 ENCOUNTER — Ambulatory Visit (INDEPENDENT_AMBULATORY_CARE_PROVIDER_SITE_OTHER): Payer: Medicare Other | Admitting: Family Medicine

## 2020-08-26 ENCOUNTER — Encounter: Payer: Self-pay | Admitting: Family Medicine

## 2020-08-26 ENCOUNTER — Other Ambulatory Visit: Payer: Self-pay

## 2020-08-26 VITALS — BP 110/70 | HR 68 | Temp 97.5°F | Ht 64.17 in | Wt 193.0 lb

## 2020-08-26 DIAGNOSIS — E782 Mixed hyperlipidemia: Secondary | ICD-10-CM

## 2020-08-26 DIAGNOSIS — R5383 Other fatigue: Secondary | ICD-10-CM

## 2020-08-26 DIAGNOSIS — Z0000 Encounter for general adult medical examination without abnormal findings: Secondary | ICD-10-CM

## 2020-08-26 DIAGNOSIS — Z79899 Other long term (current) drug therapy: Secondary | ICD-10-CM | POA: Diagnosis not present

## 2020-08-26 DIAGNOSIS — I2510 Atherosclerotic heart disease of native coronary artery without angina pectoris: Secondary | ICD-10-CM | POA: Diagnosis not present

## 2020-08-26 DIAGNOSIS — E559 Vitamin D deficiency, unspecified: Secondary | ICD-10-CM

## 2020-08-26 NOTE — Telephone Encounter (Signed)
It is fine to change rosuvastatin to 10 mg tablets to be taken once daily.  Thanks.

## 2020-08-26 NOTE — Progress Notes (Signed)
[name] Laverne T. Atanacio Melnyk, MD, [facility] [place], [address]  Phone: [phone]   FAX: [place] - 69 y.o. female   MRN [id number]   Date of Birth: [birth year]  Date: 08/26/2020   PCP: Owens Loffler, MD   Referral: Owens Loffler, MD  Chief Complaint  Patient presents with   Annual Exam    This visit occurred during the SARS-CoV-2 public health emergency.  Safety protocols were in place, including screening questions prior to the visit, additional usage of staff PPE, and extensive cleaning of exam room while observing appropriate contact time as indicated for disinfecting solutions.   Patient Care Team: Owens Loffler, MD as PCP - General End, Harrell Gave, MD as PCP - Cardiology (Cardiology) Subjective:   [name] [name] is a 69 y.o. pleasant patient who presents for a medicare wellness examination:  Health Maintenance Summary Reviewed and updated, unless pt declines services.  Tobacco History Reviewed. Non-smoker Alcohol: No concerns, no excessive use Exercise Habits: Some activity, rec at least 30 mins 5 times a week STD concerns: none Drug Use: None Birth control method: n/a Menses regular: n/a  Covid #3, she understands that this is needed, and she is scheduling this appointment. Mammo - knows, and she is going to call when she goes home today.  Walking and moving well. Therapy 3 days a week.   She was recently discharged from the hospital on July 17, 2020 with a very large pleural effusion that required thoracentesis.  She does have a medical history from chronic heart failure secondary to ischemic cardiomyopathy.  The actually drew off 1.4 L.  She has now in cardiopulmonary rehab, and she is doing better.  Health Maintenance  Topic Date Due   MAMMOGRAM  01/16/2020   COVID-19 Vaccine (3 - Pfizer risk 4-dose series)  02/24/2020   TETANUS/TDAP  08/31/2020   COLONOSCOPY  02/01/2028   INFLUENZA VACCINE  Completed   DEXA SCAN  Completed   Hepatitis C Screening  Completed   PNA vac Low Risk Adult  Completed   Immunization History  Administered Date(s) Administered   Fluad Quad(high Dose 65+) 08/19/2019, 07/17/2020   Influenza Split 07/10/2011   Influenza Whole 08/11/2008, 10/11/2009, 08/31/2010   Influenza, High Dose Seasonal PF 07/30/2018   Influenza,inj,Quad PF,6+ Mos 05/21/2013, 06/23/2014, 07/12/2015, 12/06/2017   PFIZER SARS-COV-2 Vaccination 01/05/2020, 01/27/2020   Pneumococcal Conjugate-13 12/06/2017   Pneumococcal Polysaccharide-23 08/19/2019   Td 08/31/2010    Patient Active Problem List   Diagnosis Date Noted   Chronic HFrEF (heart failure with reduced ejection fraction) ([place]) 05/03/2020    Priority: High   Acute systolic congestive heart failure ([place])     Priority: High   Coronary artery disease involving native coronary artery of native heart without angina pectoris     Priority: High   DCIS (ductal carcinoma in situ), LEFT, remission 09/28/2008    Priority: High   Depression 11/28/2019    Priority: Medium   Mixed hyperlipidemia 09/28/2008    Priority: Medium   Essential hypertension 09/28/2008    Priority: Medium   Thoracic aortic aneurysm ([place]) 07/16/2020   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 10/2012 ETT: nl; b. 12/2012 Cath: LM nl, LAD min irregs, D1 min irregs, LCX 30p, RCA 53m, RPDA 40p, EF 50%.   Diastolic dysfunction    a. 12/2012 Echo: EF 50-55%. No rwma. Gr1 DD. Nl RV fxn. Nl PASP.   History of hepatitis B    ?--tested + by [facility]   HLD  (hyperlipidemia)    HTN (hypertension)    Osteoarthritis    Osteopenia     Past Surgical History:  Procedure Laterality Date   ABDOMINAL HYSTERECTOMY  1985   endometriosis; anatomy   BREAST SURGERY  10/2002   CARDIAC CATHETERIZATION  12/19/12   [place]; EF 50%   COLONOSCOPY  8/09   Hyperplastic polyp; repeat in 10 years   CORONARY STENT INTERVENTION N/A 05/03/2020   Procedure: CORONARY STENT INTERVENTION;  Surgeon: [name], MD;  Location: [facility];  Service: Cardiovascular;  Laterality: N/A;   HIP FRACTURE SURGERY  06/14/2011   ball replaced (Dr. Earvin Hansen)   INTRAVASCULAR ULTRASOUND/IVUS N/A 05/03/2020   Procedure: Intravascular Ultrasound/IVUS;  Surgeon: [name], MD;  Location: [facility];  Service: Cardiovascular;  Laterality: N/A;   LEFT HEART CATH N/A 05/03/2020   Procedure: Left Heart Cath;  Surgeon: [name], MD;  Location: [facility];  Service: Cardiovascular;  Laterality: N/A;   MASTECTOMY Left 2004   RIGHT/LEFT HEART CATH AND CORONARY ANGIOGRAPHY N/A 12/02/2019   Procedure: RIGHT/LEFT HEART CATH AND CORONARY ANGIOGRAPHY;  Surgeon: [name], MD;  Location: [facility];  Service: Cardiovascular;  Laterality: N/A;   TONSILLECTOMY  1958    Family History  Problem Relation Age of Onset   Heart attack Father 50   Heart disease Father    Sarcoidosis Mother    Ovarian cancer Other        Aunt   Diabetes Other        GP   Breast cancer Sister 73   Cancer Sister        breast   Cancer Paternal Aunt        colon/ovarian    Past Medical History, Surgical History, Social History, Family History, Problem List, Medications, and Allergies have been reviewed and updated if relevant.  Review of Systems: Pertinent positives are listed above.  Otherwise, a full 14 point review of systems has been done in full and it is negative except where it is noted positive.  Objective:   BP 110/70 (BP  Location: Right Arm, Patient Position: Sitting)    Pulse 68    Temp (!) 97.5 F (36.4 C) (Temporal)    Ht 5' 4.17" (1.63 m)    Wt 193 lb (87.5 kg)    SpO2 94%    BMI 32.95 kg/m  Fall Risk  06/25/2020 12/09/2019 08/19/2019 08/06/2019 12/06/2017  Falls in the past year? 1 0 1 1 No  Comment - - - [facility]i Telephone Survey: data to providers prior to load -  Number falls in past yr: 0 0 0 1 -  Comment - - - Emmi Telephone Survey Actual Response = 9 -  Injury with Fall? - 0 0 1 -  Follow up - Falls evaluation completed - - -   Ideal Body Weight: Weight in (lb) to have BMI = 25: 146.1  Hearing Screening   125Hz  250Hz  500Hz  1000Hz  2000Hz  3000Hz  4000Hz  6000Hz  8000Hz   Right ear:  Left ear:           Comments: Has hearing aides  Vision Screening Comments: Has appointment with eye doctor on Tuesday (08/31/2020) Depression screen [facility] 2/9 08/02/2020 06/29/2020 08/19/2019 12/06/2017  Decreased Interest 0 1 1 1   Down, Depressed, Hopeless 0 1 1 1   PHQ - 2 Score 0 2 2 2   Altered sleeping 0 0 1 1  Tired, decreased energy 1 3 1 1   Change in appetite 1 1 1 1   Feeling bad or failure about yourself  0 1 0 0  Trouble concentrating 1 0 0 0  Moving slowly or fidgety/restless 0 0 0 0  Suicidal thoughts 0 0 0 0  PHQ-9 Score 3 7 5 5   Difficult doing work/chores Somewhat difficult - Not difficult at all Not difficult at all     GEN: well developed, well nourished, no acute distress Eyes: conjunctiva and lids normal, PERRLA, EOMI ENT: TM clear, nares clear, oral exam WNL Neck: supple, no lymphadenopathy, no thyromegaly, no JVD Pulm: clear to auscultation and percussion, respiratory effort normal CV: regular rate and rhythm, S1-S2, no murmur, rub or gallop, no bruits Chest: no scars, masses, no lumps BREAST: no lumps, no axillary LAD, no nipple discharge GI: soft, non-tender; no hepatosplenomegaly, masses; active bowel sounds all quadrants GU: Normal external female genitalia. Cervix appears intact  without lesions or irritation. Vaginal canal normal without ulceration or lesion. Cervix NT to exam. Ovaries neither enlarged nor tender. (Chaperoned examination by female staff) Lymph: no cervical, axillary or inguinal adenopathy MSK: gait normal, muscle tone and strength WNL, no joint swelling, effusions, discoloration, crepitus  SKIN: clear, good turgor, color WNL, no rashes, lesions, or ulcerations Neuro: normal mental status, normal strength, sensation, and motion Psych: alert; oriented to person, place and time, normally interactive and not anxious or depressed in appearance.  All labs reviewed with patient.  Results for orders placed or performed during the hospital encounter of 07/15/20  Respiratory Panel by RT PCR (Flu A&B, Covid) - Nasopharyngeal Swab   Specimen: Nasopharyngeal Swab  Result Value Ref Range   SARS Coronavirus 2 by RT PCR NEGATIVE NEGATIVE   Influenza A by PCR NEGATIVE NEGATIVE   Influenza B by PCR NEGATIVE NEGATIVE  Gram stain   Specimen: PATH Cytology Pleural fluid  Result Value Ref Range   Specimen Description PLEURAL    Special Requests PLEURAL    Gram Stain      CYTOSPIN SLIDE WBC PRESENT, PREDOMINANTLY MONONUCLEAR NO ORGANISMS SEEN Performed at [facility], [address] [place], [place] [address]    Report Status 07/15/2020 FINAL   Acid Fast Smear (AFB)   Specimen: PATH Cytology Pleural fluid  Result Value Ref Range   AFB Specimen Processing Concentration    Acid Fast Smear Negative    Source (AFB) PLEURAL   Basic metabolic panel  Result Value Ref Range   Sodium 134 (L) 135 - 145 mmol/L   Potassium 4.1 3.5 - 5.1 mmol/L   Chloride 95 (L) 98 - 111 mmol/L   CO2 27 22 - 32 mmol/L   Glucose, Bld 122 (H) 70 - 99 mg/dL   BUN 13 8 - 23 mg/dL   Creatinine, Ser 0.70 0.44 - 1.00 mg/dL   Calcium 9.6 8.9 - 10.3 mg/dL   GFR, Estimated >60 >60 mL/min   Anion gap 12 5 - 15  CBC  Result Value Ref Range   WBC 15.8 (H) 4.0 - 10.5 K/uL   RBC  5.35 (H)  3.87 - 5.11 MIL/uL   Hemoglobin 15.1 (H) 12.0 - 15.0 g/dL   HCT 47.1 (H) 36.0 - 46.0 %   MCV 88.0 80.0 - 100.0 fL   MCH 28.2 26.0 - 34.0 pg   MCHC 32.1 30.0 - 36.0 g/dL   RDW 14.5 11.5 - 15.5 %   Platelets 451 (H) 150 - 400 K/uL   nRBC 0.0 0.0 - 0.2 %  TSH  Result Value Ref Range   TSH 2.873 0.350 - 4.500 uIU/mL  Lactate dehydrogenase (pleural or peritoneal fluid)  Result Value Ref Range   LD, Fluid 113 (H) 3 - 23 U/L   Fluid Type-FLDH CYTO PLEU   Body fluid cell count with differential  Result Value Ref Range   Fluid Type-FCT CYTO PLEU    Color, Fluid YELLOW (A) YELLOW   Appearance, Fluid CLEAR CLEAR   Total Nucleated Cell Count, Fluid 154 cu mm   Neutrophil Count, Fluid 72 %   Lymphs, Fluid 18 %   Monocyte-Macrophage-Serous Fluid 9 %   Eos, Fluid 1 %  Albumin, pleural or peritoneal fluid  Result Value Ref Range   Albumin, Fluid 3.5 g/dL   Fluid Type-FALB CYTO PLEU   Protein, pleural or peritoneal fluid  Result Value Ref Range   Total protein, fluid 5.4 g/dL   Fluid Type-FTP CYTO PLEU   Glucose, pleural or peritoneal fluid  Result Value Ref Range   Glucose, Fluid 111 mg/dL   Fluid Type-FGLU CYTO PLEU   Basic metabolic panel  Result Value Ref Range   Sodium 133 (L) 135 - 145 mmol/L   Potassium 3.6 3.5 - 5.1 mmol/L   Chloride 94 (L) 98 - 111 mmol/L   CO2 26 22 - 32 mmol/L   Glucose, Bld 104 (H) 70 - 99 mg/dL   BUN 16 8 - 23 mg/dL   Creatinine, Ser 0.78 0.44 - 1.00 mg/dL   Calcium 9.2 8.9 - 10.3 mg/dL   GFR, Estimated >60 >60 mL/min   Anion gap 13 5 - 15  CBC  Result Value Ref Range   WBC 15.7 (H) 4.0 - 10.5 K/uL   RBC 5.02 3.87 - 5.11 MIL/uL   Hemoglobin 14.0 12.0 - 15.0 g/dL   HCT 44.4 36.0 - 46.0 %   MCV 88.4 80.0 - 100.0 fL   MCH 27.9 26.0 - 34.0 pg   MCHC 31.5 30.0 - 36.0 g/dL   RDW 14.5 11.5 - 15.5 %   Platelets 407 (H) 150 - 400 K/uL   nRBC 0.0 0.0 - 0.2 %  Pathologist smear review  Result Value Ref Range   Path Review Cytospin of pleural  fluid is reviewed.   Procalcitonin - Baseline  Result Value Ref Range   Procalcitonin <0.10 ng/mL  Lactate dehydrogenase  Result Value Ref Range   LDH 127 98 - 192 U/L  CBC with Differential/Platelet  Result Value Ref Range   WBC 14.9 (H) 4.0 - 10.5 K/uL   RBC 5.02 3.87 - 5.11 MIL/uL   Hemoglobin 13.9 12.0 - 15.0 g/dL   HCT 43.5 36.0 - 46.0 %   MCV 86.7 80.0 - 100.0 fL   MCH 27.7 26.0 - 34.0 pg   MCHC 32.0 30.0 - 36.0 g/dL   RDW 14.4 11.5 - 15.5 %   Platelets 430 (H) 150 - 400 K/uL   nRBC 0.0 0.0 - 0.2 %   Neutrophils Relative % 76 %   Neutro Abs 11.3 (H) 1.7 - 7.7 K/uL   Lymphocytes Relative  13 %   Lymphs Abs 2.0 0.7 - 4.0 K/uL   Monocytes Relative 7 %   Monocytes Absolute 1.1 (H) 0.1 - 1.0 K/uL   Eosinophils Relative 3 %   Eosinophils Absolute 0.4 0.0 - 0.5 K/uL   Basophils Relative 0 %   Basophils Absolute 0.0 0.0 - 0.1 K/uL   Immature Granulocytes 1 %   Abs Immature Granulocytes 0.07 0.00 - 0.07 K/uL  Basic metabolic panel  Result Value Ref Range   Sodium 133 (L) 135 - 145 mmol/L   Potassium 3.8 3.5 - 5.1 mmol/L   Chloride 94 (L) 98 - 111 mmol/L   CO2 27 22 - 32 mmol/L   Glucose, Bld 111 (H) 70 - 99 mg/dL   BUN 19 8 - 23 mg/dL   Creatinine, Ser 0.56 0.44 - 1.00 mg/dL   Calcium 8.9 8.9 - 10.3 mg/dL   GFR, Estimated >60 >60 mL/min   Anion gap 12 5 - 15  Magnesium  Result Value Ref Range   Magnesium 2.4 1.7 - 2.4 mg/dL  ECHOCARDIOGRAM COMPLETE  Result Value Ref Range   Weight 2,966.51 oz   Height 64 in   BP 124/61 mmHg   Ao pk vel 1.62 m/s   AV Area VTI 1.60 cm2   AR max vel 1.68 cm2   AV Mean grad 6.0 mmHg   AV Peak grad 10.5 mmHg   S' Lateral 4.34 cm   AV Area mean vel 1.60 cm2   Area-P 1/2 4.15 cm2  Troponin I (High Sensitivity)  Result Value Ref Range   Troponin I (High Sensitivity) 11 <18 ng/L  Troponin I (High Sensitivity)  Result Value Ref Range   Troponin I (High Sensitivity) 10 <18 ng/L    No results found.  Assessment and Plan:      ICD-10-CM   1. Healthcare maintenance  Z00.00   2. Coronary artery disease involving native coronary artery of native heart without angina pectoris  I25.10   3. Mixed hyperlipidemia  E78.2 Lipid panel  4. Encounter for long-term (current) use of medications  T41.962 Basic metabolic panel    CBC with Differential/Platelet    Hepatic function panel  5. Vitamin D deficiency  E55.9 VITAMIN D 25 Hydroxy (Vit-D Deficiency, Fractures)  6. Other fatigue  R53.83 TSH   Despite having recently had 1.4 L of fluid drawn out about thoracentesis, she is doing well now.  She is doing much better and working hard at cardiopulmonary rehab.  She is going 3 times a week and [facility] with [facility]hab.  Follow-up on multiple labs that were abnormal in the hospital.  She is quite compliant.  She is going to schedule her own mammogram as well as her Covid vaccine #3  Health Maintenance Exam: The patient's preventative maintenance and recommended screening tests for an annual wellness exam were reviewed in full today. Brought up to date unless services declined.  Counselled on the importance of diet, exercise, and its role in overall health and mortality. The patient's FH and SH was reviewed, including their home life, tobacco status, and drug and alcohol status.  Follow-up in 1 year for physical exam or additional follow-up below.  I have personally reviewed the Medicare Annual Wellness questionnaire and have noted 1. The patient's medical and social history 2. Their use of alcohol, tobacco or illicit drugs 3. Their current medications and supplements 4. The patient's functional ability including ADL's, fall  risks, home safety risks and hearing or visual             impairment. 5. Diet and physical activities 6. Evidence for depression or mood disorders 7. Reviewed Updated provider list, see scanned forms and [facility] Snapshot.  8. Reviewed whether or not the  patient has HCPOA or living will, and discussed what this means with the patient.  Recommended she bring in a copy for his chart in [facility].  The patients weight, height, BMI and visual acuity have been recorded in the chart I have made referrals, counseling and provided education to the patient based review of the above and I have provided the pt with a written personalized care plan for preventive services.  I have provided the patient with a copy of your personalized plan for preventive services. Instructed to take the time to review along with their updated medication list.  Follow-up: No follow-ups on file.   No orders of the defined types were placed in this encounter.  There are no discontinued medications. Orders Placed This Encounter  Procedures   Basic metabolic panel   CBC with Differential/Platelet   Hepatic function panel   Lipid panel   VITAMIN D 25 Hydroxy (Vit-D Deficiency, Fractures)   TSH    Signed,  Ruthel Martine T. Demetra Moya, MD   Allergies as of 08/26/2020      Reactions   Codeine Other (See Comments)   Reation:  Hallucinations    Morphine Other (See Comments)   Reaction:  Hallucinations    Sulfur Rash   Ace Inhibitors Cough   Sulfonamide Derivatives Rash      Medication List       Accurate as of August 26, 2020  3:54 PM. If you have any questions, ask your nurse or doctor.        aspirin EC 81 MG tablet Take 81 mg by mouth daily.   Calcium Carbonate-Vitamin D 600-400 MG-UNIT tablet Take 1 tablet by mouth daily.   carvedilol 6.25 MG tablet Commonly known as: COREG TAKE 1 TABLET (6.25 MG TOTAL) BY MOUTH 2 (TWO) TIMES DAILY WITH A MEAL.   cetirizine 10 MG tablet Commonly known as: ZYRTEC Take 10 mg by mouth at bedtime.   clopidogrel 75 MG tablet Commonly known as: PLAVIX Take 1 tablet (75 mg total) by mouth daily.   dapagliflozin propanediol 10 MG Tabs tablet Commonly known as: FARXIGA Take 1 tablet (10 mg total) by mouth daily.    Entresto 49-51 MG Generic drug: sacubitril-valsartan TAKE 1 TABLET BY MOUTH TWICE A DAY   FLUoxetine 20 MG capsule Commonly known as: PROZAC Take 1 capsule by mouth once daily   furosemide 40 MG tablet Commonly known as: LASIX TAKE 1 TABLET BY MOUTH EVERY DAY   GRAPE SEED EXTRACT PO Take 1 capsule by mouth 2 (two) times daily.   multivitamin with minerals Tabs tablet Take 1 tablet by mouth daily.   Osteo Bi-Flex Adv Joint Shield Tabs Take 1 tablet by mouth 2 (two) times daily.   rosuvastatin 10 MG tablet Commonly known as: CRESTOR Take 1 tablet (10 mg total) by mouth daily.   spironolactone 25 MG tablet Commonly known as: ALDACTONE TAKE 1 TABLET BY MOUTH EVERY DAY

## 2020-08-26 NOTE — Telephone Encounter (Signed)
Rx request sent to pharmacy.  

## 2020-08-26 NOTE — Telephone Encounter (Signed)
Ok to change

## 2020-08-27 ENCOUNTER — Encounter: Payer: Medicare Other | Admitting: *Deleted

## 2020-08-27 DIAGNOSIS — Z955 Presence of coronary angioplasty implant and graft: Secondary | ICD-10-CM | POA: Diagnosis not present

## 2020-08-27 LAB — HEPATIC FUNCTION PANEL
ALT: 17 U/L (ref 0–35)
AST: 18 U/L (ref 0–37)
Albumin: 4.5 g/dL (ref 3.5–5.2)
Alkaline Phosphatase: 61 U/L (ref 39–117)
Bilirubin, Direct: 0.1 mg/dL (ref 0.0–0.3)
Total Bilirubin: 0.4 mg/dL (ref 0.2–1.2)
Total Protein: 7.2 g/dL (ref 6.0–8.3)

## 2020-08-27 LAB — BASIC METABOLIC PANEL WITH GFR
BUN: 11 mg/dL (ref 6–23)
CO2: 32 meq/L (ref 19–32)
Calcium: 10.1 mg/dL (ref 8.4–10.5)
Chloride: 100 meq/L (ref 96–112)
Creatinine, Ser: 0.65 mg/dL (ref 0.40–1.20)
GFR: 90 mL/min
Glucose, Bld: 99 mg/dL (ref 70–99)
Potassium: 4.8 meq/L (ref 3.5–5.1)
Sodium: 138 meq/L (ref 135–145)

## 2020-08-27 LAB — LIPID PANEL
Cholesterol: 187 mg/dL (ref 0–200)
HDL: 46.6 mg/dL (ref >39.00)
NonHDL: 140.18
Total CHOL/HDL Ratio: 4
Triglycerides: 262 mg/dL — ABNORMAL HIGH (ref 0.0–149.0)
VLDL: 52.4 mg/dL — ABNORMAL HIGH (ref 0.0–40.0)

## 2020-08-27 LAB — CBC WITH DIFFERENTIAL/PLATELET
Basophils Absolute: 0.1 10*3/uL (ref 0.0–0.1)
Basophils Relative: 0.6 % (ref 0.0–3.0)
Eosinophils Absolute: 0.3 10*3/uL (ref 0.0–0.7)
Eosinophils Relative: 3 % (ref 0.0–5.0)
HCT: 45.5 % (ref 36.0–46.0)
Hemoglobin: 14.8 g/dL (ref 12.0–15.0)
Lymphocytes Relative: 27.3 % (ref 12.0–46.0)
Lymphs Abs: 2.8 10*3/uL (ref 0.7–4.0)
MCHC: 32.4 g/dL (ref 30.0–36.0)
MCV: 86.5 fl (ref 78.0–100.0)
Monocytes Absolute: 1 10*3/uL (ref 0.1–1.0)
Monocytes Relative: 10.2 % (ref 3.0–12.0)
Neutro Abs: 6 10*3/uL (ref 1.4–7.7)
Neutrophils Relative %: 58.9 % (ref 43.0–77.0)
Platelets: 345 10*3/uL (ref 150.0–400.0)
RBC: 5.26 Mil/uL — ABNORMAL HIGH (ref 3.87–5.11)
RDW: 16.8 % — ABNORMAL HIGH (ref 11.5–15.5)
WBC: 10.1 10*3/uL (ref 4.0–10.5)

## 2020-08-27 LAB — LDL CHOLESTEROL, DIRECT: Direct LDL: 50 mg/dL

## 2020-08-27 LAB — TSH: TSH: 1.79 u[IU]/mL (ref 0.35–4.50)

## 2020-08-27 LAB — VITAMIN D 25 HYDROXY (VIT D DEFICIENCY, FRACTURES): VITD: 25.11 ng/mL — ABNORMAL LOW (ref 30.00–100.00)

## 2020-08-27 NOTE — Progress Notes (Signed)
Daily Session Note  Patient Details  Name: JAE BRUCK MRN: [id number] Date of Birth: 10/20/1950 Referring Provider:   Flowsheet Row Cardiac Rehab from 06/29/2020 in [facility]  Referring Provider End      Encounter Date: 08/27/2020  Check In:  Session Check In - 08/27/20 1119      Check-In   Supervising physician immediately available to respond to emergencies See telemetry face sheet for immediately available ER MD    Location [facility]    Staff Present Renita Papa, RN BSN;Joseph [address] [place], [place], RCEP, CCRP, CCET;Melissa T[place] RDN, [place]    Virtual Visit No    Medication changes reported     No    Fall or balance concerns reported    No    Warm-up and Cool-down Performed on first and last piece of equipment    Resistance Training Performed Yes    VAD Patient? No    PAD/SET Patient? No      Pain Assessment   Currently in Pain? No/denies              Social History   Tobacco Use  Smoking Status Former Smoker  . Packs/day: 0.50  . Years: 10.00  . Pack years: 5.00  . Types: Cigarettes  Smokeless Tobacco Former Systems developer  . Quit date: 01/24/1986  Tobacco Comment   1989 quit     Goals Met:  Independence with exercise equipment Exercise tolerated well No report of cardiac concerns or symptoms Strength training completed today  Goals Unmet:  Not Applicable  Comments: Pt able to follow exercise prescription today without complaint.  Will continue to monitor for progression.    Dr. Emily Filbert is Medical Director for [facility] and [facility].

## 2020-08-28 LAB — ACID FAST CULTURE WITH REFLEXED SENSITIVITIES (MYCOBACTERIA): Acid Fast Culture: NEGATIVE

## 2020-08-31 ENCOUNTER — Encounter: Payer: Self-pay | Admitting: Family Medicine

## 2020-08-31 DIAGNOSIS — H353132 Nonexudative age-related macular degeneration, bilateral, intermediate dry stage: Secondary | ICD-10-CM | POA: Diagnosis not present

## 2020-08-31 DIAGNOSIS — H2513 Age-related nuclear cataract, bilateral: Secondary | ICD-10-CM | POA: Diagnosis not present

## 2020-08-31 DIAGNOSIS — H25013 Cortical age-related cataract, bilateral: Secondary | ICD-10-CM | POA: Diagnosis not present

## 2020-08-31 DIAGNOSIS — H40013 Open angle with borderline findings, low risk, bilateral: Secondary | ICD-10-CM | POA: Diagnosis not present

## 2020-09-08 ENCOUNTER — Encounter: Payer: Self-pay | Admitting: *Deleted

## 2020-09-08 DIAGNOSIS — Z955 Presence of coronary angioplasty implant and graft: Secondary | ICD-10-CM

## 2020-09-08 NOTE — Progress Notes (Signed)
Cardiac Individual Treatment Plan  Patient Details  Name: ALLURA DOEPKE MRN: [id number] Date of Birth: Jan 20, 1951 Referring Provider:   Flowsheet Row Cardiac Rehab from 06/29/2020 in [facility] Cardiac and Pulmonary Rehab  Referring Provider End      Initial Encounter Date:  Flowsheet Row Cardiac Rehab from 06/29/2020 in [facility] Cardiac and Pulmonary Rehab  Date 06/29/20      Visit Diagnosis: Status post coronary artery stent placement  Patient's Home Medications on Admission:  Current Outpatient Medications:  .  aspirin EC 81 MG tablet, Take 81 mg by mouth daily., Disp: , Rfl:  .  Calcium Carbonate-Vitamin D 600-400 MG-UNIT tablet, Take 1 tablet by mouth daily. , Disp: , Rfl:  .  carvedilol (COREG) 6.25 MG tablet, TAKE 1 TABLET (6.25 MG TOTAL) BY MOUTH 2 (TWO) TIMES DAILY WITH A MEAL., Disp: 180 tablet, Rfl: 1 .  cetirizine (ZYRTEC) 10 MG tablet, Take 10 mg by mouth at bedtime. , Disp: , Rfl:  .  clopidogrel (PLAVIX) 75 MG tablet, Take 1 tablet (75 mg total) by mouth daily., Disp: 30 tablet, Rfl: 6 .  dapagliflozin propanediol (FARXIGA) 10 MG TABS tablet, Take 1 tablet (10 mg total) by mouth daily., Disp: 90 tablet, Rfl: 3 .  ENTRESTO 49-51 MG, TAKE 1 TABLET BY MOUTH TWICE A DAY, Disp: 60 tablet, Rfl: 5 .  FLUoxetine (PROZAC) 20 MG capsule, Take 1 capsule by mouth once daily, Disp: 90 capsule, Rfl: 0 .  furosemide (LASIX) 40 MG tablet, TAKE 1 TABLET BY MOUTH EVERY DAY, Disp: 90 tablet, Rfl: 1 .  GRAPE SEED EXTRACT PO, Take 1 capsule by mouth 2 (two) times daily. , Disp: , Rfl:  .  Misc Natural Products (OSTEO BI-FLEX ADV JOINT SHIELD) TABS, Take 1 tablet by mouth 2 (two) times daily., Disp: , Rfl:  .  Multiple Vitamin (MULTIVITAMIN WITH MINERALS) TABS tablet, Take 1 tablet by mouth daily., Disp: , Rfl:  .  rosuvastatin (CRESTOR) 10 MG tablet, Take 1 tablet (10 mg total) by mouth daily., Disp: 1 tablet, Rfl: 0 .  spironolactone (ALDACTONE) 25 MG tablet, TAKE 1 TABLET BY MOUTH EVERY DAY,  Disp: 90 tablet, Rfl: 0  Past Medical History: Past Medical History:  Diagnosis Date  . Allergic rhinitis   . Breast cancer [facility]) 2004   Left Breast Cancer  . CHF (congestive heart failure) ([place])   . Coronary artery disease    a. 10/2012 ETT: nl; b. 12/2012 Cath: LM nl, LAD min irregs, D1 min irregs, LCX 30p, RCA 30m RPDA 40p, EF 50%.  . Diastolic dysfunction    a. 12/2012 Echo: EF 50-55%. No rwma. Gr1 DD. Nl RV fxn. Nl PASP.  .[name] KitchenHistory of hepatitis B    ?--tested + by [employer] . HLD (hyperlipidemia)   . HTN (hypertension)   . Osteoarthritis   . Osteopenia     Tobacco Use: Social History   Tobacco Use  Smoking Status Former Smoker  . Packs/day: 0.50  . Years: 10.00  . Pack years: 5.00  . 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 Quit date: 01/24/1986  Tobacco Comment   1989 quit     Labs: Recent Review Flowsheet Data    Labs for ITP Cardiac and Pulmonary Rehab Latest Ref Rng & Units 12/19/2017 04/23/2019 11/29/2019 05/04/2020 08/26/2020   Cholestrol 0 - 200 mg/dL - 330(H) 155 100 187   LDLCALC 0 - 99 mg/dL - - 87 48 -   LDLDIRECT mg/dL - 76.0 - -  50.0   HDL >39.00 mg/dL - 41.20 35(L) 39(L) 46.60   Trlycerides 0.0 - 149.0 mg/dL - 439.0 Triglyceride is over 400; calculations on Lipids are invalid.(H) 165(H) 64 262.0(H)   Hemoglobin A1c 4.6 - 6.5 % 5.9 5.7 - - -       Exercise Target Goals: Exercise Program Goal: Individual exercise prescription set using results from initial 6 min walk test and THRR while considering  patient's activity barriers and safety.   Exercise Prescription Goal: Initial exercise prescription builds to 30-45 minutes a day of aerobic activity, 2-3 days per week.  Home exercise guidelines will be given to patient during program as part of exercise prescription that the participant will acknowledge.   Education: Aerobic Exercise: - Group verbal and visual presentation on the components of exercise prescription. Introduces F.I.T.T  principle from ACSM for exercise prescriptions.  Reviews F.I.T.T. principles of aerobic exercise including progression. Written material given at graduation. Flowsheet Row Cardiac Rehab from 07/07/2020 in [facility] Cardiac and Pulmonary Rehab  Date 07/07/20  [facility] 07/07/20,  Aerobic 06/30/20]  Educator [facility]  Instruction Review Code 1- Verbalizes Understanding      Education: Resistance Exercise: - Group verbal and visual presentation on the components of exercise prescription. Introduces F.I.T.T principle from ACSM for exercise prescriptions  Reviews F.I.T.T. principles of resistance exercise including progression. Written material given at graduation.    Education: Exercise & Equipment Safety: - Individual verbal instruction and demonstration of equipment use and safety with use of the equipment. Flowsheet Row Cardiac Rehab from 07/07/2020 in [facility] Cardiac and Pulmonary Rehab  Date 06/29/20  Educator AS  Instruction Review Code 1- Verbalizes Understanding      Education: Exercise Physiology & General Exercise Guidelines: - Group verbal and written instruction with models to review the exercise physiology of the cardiovascular system and associated critical values. Provides general exercise guidelines with specific guidelines to those with heart or lung disease.    Education: Flexibility, Balance, Mind/Body Relaxation: - Group verbal and visual presentation with interactive activity on the components of exercise prescription. Introduces F.I.T.T principle from ACSM for exercise prescriptions. Reviews F.I.T.T. principles of flexibility and balance exercise training including progression. Also discusses the mind body connection.  Reviews various relaxation techniques to help reduce and manage stress (i.e. Deep breathing, progressive muscle relaxation, and visualization). Balance handout provided to take home. Written material given at graduation.   Activity Barriers & Risk Stratification:   Activity Barriers & Cardiac Risk Stratification - 06/25/20 1304      Activity Barriers & Cardiac Risk Stratification   Activity Barriers Right Hip Replacement;Shortness of Breath    Cardiac Risk Stratification High           6 Minute Walk:  6 Minute Walk    Row Name 06/29/20 1443         6 Minute Walk   Phase Initial     Distance 700 feet     Walk Time 4.5 minutes     # of Rest Breaks 3     MPH 1.76     METS 1.92     RPE 15     Perceived Dyspnea  3     VO2 Peak 6.7     Symptoms Yes (comment)     Comments SOB     Resting HR 82 bpm     Resting BP 102/64     Resting Oxygen Saturation  94 %     Exercise Oxygen Saturation  during 6 min walk 94 %  Max Ex. HR 111 bpm     Max Ex. BP 152/74     2 Minute Post BP 104/68            Oxygen Initial Assessment:   Oxygen Re-Evaluation:   Oxygen Discharge (Final Oxygen Re-Evaluation):   Initial Exercise Prescription:  Initial Exercise Prescription - 06/29/20 1400      Date of Initial Exercise RX and Referring Provider   Date 06/29/20    Referring Provider End      Treadmill   MPH 1.2    Grade 0    Minutes 15    METs 1.9      Recumbant Bike   Level 1    RPM 60    Minutes 15    METs 1.9      NuStep   Level 1    SPM 80    Minutes 15    METs 1.9      REL-XR   Level 1    Speed 50    Minutes 15    METs 1.9      T5 Nustep   Level 1    SPM 80    Minutes 15    METs 1.9      Prescription Details   Frequency (times per week) 3    Duration Progress to 30 minutes of continuous aerobic without signs/symptoms of physical distress      Intensity   THRR 40-80% of Max Heartrate 110-137    Ratings of Perceived Exertion 11-15    Perceived Dyspnea 0-4           Perform Capillary Blood Glucose checks as needed.  Exercise Prescription Changes:  Exercise Prescription Changes    Row Name 06/29/20 1400 07/07/20 1700 08/02/20 1700 08/18/20 0900 08/31/20 0900     Response to Exercise   Blood Pressure  (Admit) 102/64 124/80 98/64 128/70 102/70   Blood Pressure (Exercise) 152/74 158/108 114/80 152/68 100/64   Blood Pressure (Exit) 104/68 122/76 100/58 118/60 120/60   Heart Rate (Admit) 82 bpm 104 bpm 80 bpm 70 bpm 86 bpm   Heart Rate (Exercise) 111 bpm 133 bpm 115 bpm 108 bpm 112 bpm   Heart Rate (Exit) 84 bpm 103 bpm 88 bpm 88 bpm 75 bpm   Oxygen Saturation (Admit) 94 % -- -- -- --   Oxygen Saturation (Exercise) 94 % -- -- -- --   Rating of Perceived Exertion (Exercise) 15 15 13 13 13    Perceived Dyspnea (Exercise) 3 -- -- -- --   Symptoms SOB none none none none   Duration -- Progress to 30 minutes of  aerobic without signs/symptoms of physical distress Progress to 30 minutes of  aerobic without signs/symptoms of physical distress Continue with 30 min of aerobic exercise without signs/symptoms of physical distress. Continue with 30 min of aerobic exercise without signs/symptoms of physical distress.   Intensity -- THRR unchanged THRR unchanged THRR unchanged THRR unchanged     Progression   Progression -- Continue to progress workloads to maintain intensity without signs/symptoms of physical distress. Continue to progress workloads to maintain intensity without signs/symptoms of physical distress. Continue to progress workloads to maintain intensity without signs/symptoms of physical distress. Continue to progress workloads to maintain intensity without signs/symptoms of physical distress.   Average METs -- 1.92 2.46 --  no METs to report 2.1     Resistance Training   Weight -- 3 3 3 3    Reps -- 10-15 10-15 10-15 10-15  Interval Training   Interval Training -- No No No No     Treadmill   MPH -- 1.2 1.3 -- --   Grade -- 0 0 -- --   Minutes -- 15 15 -- --   METs -- 1.9 1.92 -- --     Recumbant Bike   Level -- -- -- 2 1   Minutes -- -- -- 15 15   METs -- -- -- -- 1.8     NuStep   Level -- -- -- -- 3   SPM -- -- -- -- 80   Minutes -- -- -- -- 15   METs -- -- -- -- 2.4      Arm Ergometer   Level -- -- -- 1 --   Minutes -- -- -- 15 --     REL-XR   Level -- 1 1 -- --   Speed -- 50 50 -- --   Minutes -- 15 15 -- --   METs -- -- 3 -- --          Exercise Comments:   Exercise Goals and Review:  Exercise Goals    Row Name 06/29/20 1459             Exercise Goals   Increase Physical Activity Yes       Intervention Provide advice, education, support and counseling about physical activity/exercise needs.;Develop an individualized exercise prescription for aerobic and resistive training based on initial evaluation findings, risk stratification, comorbidities and participant's personal goals.       Expected Outcomes Short Term: Attend rehab on a regular basis to increase amount of physical activity.;Long Term: Add in home exercise to make exercise part of routine and to increase amount of physical activity.;Long Term: Exercising regularly at least 3-5 days a week.       Increase Strength and Stamina Yes       Intervention Provide advice, education, support and counseling about physical activity/exercise needs.;Develop an individualized exercise prescription for aerobic and resistive training based on initial evaluation findings, risk stratification, comorbidities and participant's personal goals.       Expected Outcomes Short Term: Increase workloads from initial exercise prescription for resistance, speed, and METs.;Short Term: Perform resistance training exercises routinely during rehab and add in resistance training at home;Long Term: Improve cardiorespiratory fitness, muscular endurance and strength as measured by increased METs and functional capacity (6MWT)       Able to understand and use rate of perceived exertion (RPE) scale Yes       Intervention Provide education and explanation on how to use RPE scale       Expected Outcomes Short Term: Able to use RPE daily in rehab to express subjective intensity level;Long Term:  Able to use RPE to guide intensity  level when exercising independently       Able to understand and use Dyspnea scale Yes       Intervention Provide education and explanation on how to use Dyspnea scale       Expected Outcomes Short Term: Able to use Dyspnea scale daily in rehab to express subjective sense of shortness of breath during exertion;Long Term: Able to use Dyspnea scale to guide intensity level when exercising independently       Knowledge and understanding of Target Heart Rate Range (THRR) Yes       Intervention Provide education and explanation of THRR including how the numbers were predicted and where they are located for reference  Expected Outcomes Short Term: Able to state/look up THRR;Short Term: Able to use daily as guideline for intensity in rehab;Long Term: Able to use THRR to govern intensity when exercising independently       Able to check pulse independently Yes       Intervention Provide education and demonstration on how to check pulse in carotid and radial arteries.;Review the importance of being able to check your own pulse for safety during independent exercise       Expected Outcomes Short Term: Able to explain why pulse checking is important during independent exercise;Long Term: Able to check pulse independently and accurately       Understanding of Exercise Prescription Yes       Intervention Provide education, explanation, and written materials on patient's individual exercise prescription       Expected Outcomes Short Term: Able to explain program exercise prescription;Long Term: Able to explain home exercise prescription to exercise independently              Exercise Goals Re-Evaluation :  Exercise Goals Re-Evaluation    Row Name 06/30/20 1133 07/07/20 1715 07/21/20 1438 08/02/20 1118 08/18/20 0916     Exercise Goal Re-Evaluation   Exercise Goals Review Increase Physical Activity;Able to understand and use rate of perceived exertion (RPE) scale;Knowledge and understanding of Target  Heart Rate Range (THRR);Understanding of Exercise Prescription;Increase Strength and Stamina;Able to check pulse independently Increase Physical Activity;Increase Strength and Stamina -- Increase Physical Activity;Increase Strength and Stamina Increase Physical Activity;Increase Strength and Stamina;Understanding of Exercise Prescription   Comments Reviewed RPE and dyspnea scales, THR and program prescription with pt today.  Pt voiced understanding and was given a copy of goals to take home. Debbie tolerates exercise well.  She works in [employer] range and RPE 15.  Staff will monitor progress. Out since last review (hospitalized) Patient was recently disharged from the hospital as she had a thoracentesis completed due to her significant SOB. An Exercise Physiologist will review home exercise with patient when ready. Will provide patient time to recover from hospital until review. She did walk to rehab today and felt good doing it. She also walked over the weekend while shopping and did not have to pause or take breaks. Jackelyn Poling is pleased. She does have an elliptical at home that she will plan to use for home exercise. Jackelyn Poling is only on session 8 despite starting in October.  She has had a hospital admission and several transportation issues.  She would benefit more with improved attendance and hopes to return on Friday.  We will continue to monitor her progress.   Expected Outcomes Short: Use RPE daily to regulate intensity. Long: Follow program prescription in THR. Short:  attend consistently Long:  increase overall MET level -- Short: Review home exercise Long: Be able to exercise independently at home with no complications Short: Return to regulary attendance in rehab  Long:  Improve overall stamina.   [place] Name 08/27/20 1133 08/31/20 0925           Exercise Goal Re-Evaluation   Exercise Goals Review Increase Physical Activity;Increase Strength and Stamina;Understanding of Exercise Prescription Increase Physical  Activity;Increase Strength and Stamina      Comments EP to go over home exercise with Debbie. She walks at home (walking her dog) - 3 minutes several times per day (road not flat). She reports after rehab going to the gym abover her apartment. Jackelyn Poling has attended 3 times this month.  She works in lower end of  THR range when she attends.  More consistent attendance will increase progress.      Expected Outcomes ST: continue attending rehab regularly, go over home exercise with EP LT: 150 minutes of moderate activity and weights 2x/week. Short: get back to consistent attendance Long:  improve overall stamina             Discharge Exercise Prescription (Final Exercise Prescription Changes):  Exercise Prescription Changes - 08/31/20 0900      Response to Exercise   Blood Pressure (Admit) 102/70    Blood Pressure (Exercise) 100/64    Blood Pressure (Exit) 120/60    Heart Rate (Admit) 86 bpm    Heart Rate (Exercise) 112 bpm    Heart Rate (Exit) 75 bpm    Rating of Perceived Exertion (Exercise) 13    Symptoms none    Duration Continue with 30 min of aerobic exercise without signs/symptoms of physical distress.    Intensity THRR unchanged      Progression   Progression Continue to progress workloads to maintain intensity without signs/symptoms of physical distress.    Average METs 2.1      Resistance Training   Weight 3    Reps 10-15      Interval Training   Interval Training No      Recumbant Bike   Level 1    Minutes 15    METs 1.8      NuStep   Level 3    SPM 80    Minutes 15    METs 2.4           Nutrition:  Target Goals: Understanding of nutrition guidelines, daily intake of sodium <1552m, cholesterol <2033m calories 30% from fat and 7% or less from saturated fats, daily to have 5 or more servings of fruits and vegetables.  Education: All About Nutrition: -Group instruction provided by verbal, written material, interactive activities, discussions, models, and  posters to present general guidelines for heart healthy nutrition including fat, fiber, MyPlate, the role of sodium in heart healthy nutrition, utilization of the nutrition label, and utilization of this knowledge for meal planning. Follow up email sent as well. Written material given at graduation.   Biometrics:  Pre Biometrics - 06/29/20 1500      Pre Biometrics   Height 5' 5"  (1.651 m)    Weight 190 lb 11.2 oz (86.5 kg)    BMI (Calculated) 31.73    Single Leg Stand 6.87 seconds            Nutrition Therapy Plan and Nutrition Goals:  Nutrition Therapy & Goals - 08/27/20 1112      Nutrition Therapy   Diet Heart healthy, low Na    Protein (specify units) 85g      Personal Nutrition Goals   Nutrition Goal ST: add protein to lunch or have protein source at snack: boiled egg, cottage cheese, beans in soup, nuts or peanut butter, greek yogurt, leftovers from dinner LT: gain muscle    Comments Her appetite is increasing, but it is poor; she believes the increase in exercise and improved breathing has helped. Weight has been stable and she is no longer losing weight (30lbs since march lost unintentionally) - she can tell she lost muscle mass during this time. B: sandwich (chicken salad), boiled eggs, cereal or oatmeal, or biscuit L: pack of snack crackers, can of soup  D: oven chicken, (2 vegetables and meat), can be corn and pasta if her roommate is cooking because that is  all her daughter will eat. her roommate will cook a couple of times per week. She reports liking a lot of different fruits and vegetables. She is on a budget. Discussed meeting protein and calorie needs and heart healthy eating. Discussed additional protein sources for lunch or a snack that has protein.      Intervention Plan   Intervention Prescribe, educate and counsel regarding individualized specific dietary modifications aiming towards targeted core components such as weight, hypertension, lipid management, diabetes,  heart failure and other comorbidities.;Nutrition handout(s) given to patient.    Expected Outcomes Short Term Goal: Understand basic principles of dietary content, such as calories, fat, sodium, cholesterol and nutrients.;Short Term Goal: A plan has been developed with personal nutrition goals set during dietitian appointment.;Long Term Goal: Adherence to prescribed nutrition plan.           Nutrition Assessments:  Nutrition Assessments - 06/29/20 1502      MEDFICTS Scores   Pre Score 27          MEDIFICTS Score Key:  ?70 Need to make dietary changes   40-70 Heart Healthy Diet  ? 40 Therapeutic Level Cholesterol Diet   Picture Your Plate Scores:  <50 Unhealthy dietary pattern with much room for improvement.  41-50 Dietary pattern unlikely to meet recommendations for good health and room for improvement.  51-60 More healthful dietary pattern, with some room for improvement.   >60 Healthy dietary pattern, although there may be some specific behaviors that could be improved.    Nutrition Goals Re-Evaluation:  Nutrition Goals Re-Evaluation    [place] Name 08/02/20 1315             Goals   Comment Jackelyn Poling has not completed her initial assessment with the RD yet, but will get an appointment with her soon as she has been in the hospital.       Expected Outcome Short: Meet with RD for initial evaluation Long: Achieve goals provided by RD              Nutrition Goals Discharge (Final Nutrition Goals Re-Evaluation):  Nutrition Goals Re-Evaluation - 08/02/20 1315      Goals   Comment Debbie has not completed her initial assessment with the RD yet, but will get an appointment with her soon as she has been in the hospital.    Expected Outcome Short: Meet with RD for initial evaluation Long: Achieve goals provided by RD           Psychosocial: Target Goals: Acknowledge presence or absence of significant depression and/or stress, maximize coping skills, provide positive  support system. Participant is able to verbalize types and ability to use techniques and skills needed for reducing stress and depression.   Education: Stress, Anxiety, and Depression - Group verbal and visual presentation to define topics covered.  Reviews how body is impacted by stress, anxiety, and depression.  Also discusses healthy ways to reduce stress and to treat/manage anxiety and depression.  Written material given at graduation.   Education: Sleep Hygiene -Provides group verbal and written instruction about how sleep can affect your health.  Define sleep hygiene, discuss sleep cycles and impact of sleep habits. Review good sleep hygiene tips.    Initial Review & Psychosocial Screening:  Initial Psych Review & Screening - 06/25/20 1307      Initial Review   Current issues with None Identified      Family Dynamics   Good Support System? Yes      Barriers  Psychosocial barriers to participate in program There are no identifiable barriers or psychosocial needs.;The patient should benefit from training in stress management and relaxation.      Screening Interventions   Interventions Encouraged to exercise;Provide feedback about the scores to participant;To provide support and resources with identified psychosocial needs    Expected Outcomes Short Term goal: Utilizing psychosocial counselor, staff and physician to assist with identification of specific Stressors or current issues interfering with healing process. Setting desired goal for each stressor or current issue identified.;Long Term Goal: Stressors or current issues are controlled or eliminated.;Short Term goal: Identification and review with participant of any Quality of Life or Depression concerns found by scoring the questionnaire.;Long Term goal: The participant improves quality of Life and PHQ9 Scores as seen by post scores and/or verbalization of changes           Quality of Life Scores:   Quality of Life - 06/29/20  1502      Quality of Life   Select Quality of Life      Quality of Life Scores   Health/Function Pre 18.88 %    Socioeconomic Pre 24.58 %    Psych/Spiritual Pre 24 %    Family Pre 19.67 %    GLOBAL Pre 21.38 %          Scores of 19 and below usually indicate a poorer quality of life in these areas.  A difference of  2-3 points is a clinically meaningful difference.  A difference of 2-3 points in the total score of the Quality of Life Index has been associated with significant improvement in overall quality of life, self-image, physical symptoms, and general health in studies assessing change in quality of life.  PHQ-9: Recent Review Flowsheet Data    Depression screen [facility] 2/9 08/02/2020 06/29/2020 08/19/2019 12/06/2017   Decreased Interest 0 1 1 1    Down, Depressed, Hopeless 0 1 1 1    PHQ - 2 Score 0 2 2 2    Altered sleeping 0 0 1 1   Tired, decreased energy 1 3  1 1    Change in appetite 1 1 1 1    Feeling bad or failure about yourself  0 1 0 0   Trouble concentrating 1 0 0 0   Moving slowly or fidgety/restless 0 0 0 0   Suicidal thoughts 0 0 0 0   PHQ-9 Score 3 7 5 5    Difficult doing work/chores Somewhat difficult - Not difficult at all Not difficult at all     Interpretation of Total Score  Total Score Depression Severity:  1-4 = Minimal depression, 5-9 = Mild depression, 10-14 = Moderate depression, 15-19 = Moderately severe depression, 20-27 = Severe depression   Psychosocial Evaluation and Intervention:  Psychosocial Evaluation - 06/25/20 1312      Psychosocial Evaluation & Interventions   Interventions Encouraged to exercise with the program and follow exercise prescription    Comments Jackelyn Poling reports doing okay post stent. She is really struggling with her shortness of breath and her stamina. She hopes to improve with coming to rehab so she can walk her dog and around the store. She states she is a pretty easy going person with little stress. A family that needed a  place to stay currently is staying with her and keeps her company.    Expected Outcomes Short: attend cardiac rehab for education and exercise. Long: develop positive self care habits.    Continue Psychosocial Services  Follow up required by staff  Psychosocial Re-Evaluation:  Psychosocial Re-Evaluation    [place] Name 08/02/20 1135 08/27/20 1137           Psychosocial Re-Evaluation   Current issues with None Identified --      Comments Jackelyn Poling denies any concerns at this time. She reports she sleeps well and is currently taking  Fluoxetine, more for hot flashses, but stays compliant and feels it helps her. She enjoys exercising and can already tell her stamina has increased. Her biggest stressor was her SOB, however, she recently had a thoracentsis completed and reports feeling better and being able to complete her tasks at home. Her PHQ improved from a 7 to a 3! Debbie reports no mental health concerns at this time. She reports still sleeping (7-8 hours per night). She is still taking her fluoxetine. She can tell the exercise has been helping her appetite and breathing. She will meditate and take deep breaths when she is stressed. She quilts to reudce stress. She reports her family (sister is down the road) and her roommate help support her (she has no children and her husband passed away)      Expected Outcomes Short: Continue attending rehab Long: Maintain positive attitude and utilize exercise for stress management Short: Continue attending rehab Long: Maintain positive attitude and utilize exercise for stress management      Interventions Encouraged to attend Cardiac Rehabilitation for the exercise Encouraged to attend Cardiac Rehabilitation for the exercise      Continue Psychosocial Services  Follow up required by staff Follow up required by staff             Psychosocial Discharge (Final Psychosocial Re-Evaluation):  Psychosocial Re-Evaluation - 08/27/20 1137       Psychosocial Re-Evaluation   Comments Jackelyn Poling reports no mental health concerns at this time. She reports still sleeping (7-8 hours per night). She is still taking her fluoxetine. She can tell the exercise has been helping her appetite and breathing. She will meditate and take deep breaths when she is stressed. She quilts to reudce stress. She reports her family (sister is down the road) and her roommate help support her (she has no children and her husband passed away)    Expected Outcomes Short: Continue attending rehab Long: Maintain positive attitude and utilize exercise for stress management    Interventions Encouraged to attend Cardiac Rehabilitation for the exercise    Continue Psychosocial Services  Follow up required by staff           Vocational Rehabilitation: Provide vocational rehab assistance to qualifying candidates.   Vocational Rehab Evaluation & Intervention:  Vocational Rehab - 06/25/20 1306      Initial Vocational Rehab Evaluation & Intervention   Assessment shows need for Vocational Rehabilitation No           Education: Education Goals: Education classes will be provided on a variety of topics geared toward better understanding of heart health and risk factor modification. Participant will state understanding/return demonstration of topics presented as noted by education test scores.  Learning Barriers/Preferences:  Learning Barriers/Preferences - 06/25/20 1306      Learning Barriers/Preferences   Learning Barriers None    Learning Preferences None           General Cardiac Education Topics:  AED/CPR: - Group verbal and written instruction with the use of models to demonstrate the basic use of the AED with the basic ABC's of resuscitation.   Anatomy and Cardiac Procedures: - Group verbal and visual presentation and  models provide information about basic cardiac anatomy and function. Reviews the testing methods done to diagnose heart disease and the  outcomes of the test results. Describes the treatment choices: Medical Management, Angioplasty, or Coronary Bypass Surgery for treating various heart conditions including Myocardial Infarction, Angina, Valve Disease, and Cardiac Arrhythmias.  Written material given at graduation. Flowsheet Row Cardiac Rehab from 07/07/2020 in [facility] Cardiac and Pulmonary Rehab  Date 07/07/20  Educator [facility]  Instruction Review Code 1- Verbalizes Understanding      Medication Safety: - Group verbal and visual instruction to review commonly prescribed medications for heart and lung disease. Reviews the medication, class of the drug, and side effects. Includes the steps to properly store meds and maintain the prescription regimen.  Written material given at graduation.   Intimacy: - Group verbal instruction through game format to discuss how heart and lung disease can affect sexual intimacy. Written material given at graduation.. Flowsheet Row Cardiac Rehab from 07/07/2020 in [facility] Cardiac and Pulmonary Rehab  Date 06/30/20  Educator [facility]  Instruction Review Code 1- Verbalizes Understanding      Know Your Numbers and Heart Failure: - Group verbal and visual instruction to discuss disease risk factors for cardiac and pulmonary disease and treatment options.  Reviews associated critical values for Overweight/Obesity, Hypertension, Cholesterol, and Diabetes.  Discusses basics of heart failure: signs/symptoms and treatments.  Introduces Heart Failure Zone chart for action plan for heart failure.  Written material given at graduation.   Infection Prevention: - Provides verbal and written material to individual with discussion of infection control including proper hand washing and proper equipment cleaning during exercise session. Flowsheet Row Cardiac Rehab from 07/07/2020 in [facility] Cardiac and Pulmonary Rehab  Date 06/29/20  Educator AS  Instruction Review Code 1- Verbalizes Understanding      Falls Prevention: -  Provides verbal and written material to individual with discussion of falls prevention and safety. Flowsheet Row Cardiac Rehab from 07/07/2020 in [facility] Cardiac and Pulmonary Rehab  Date 06/29/20  Educator AS  Instruction Review Code 1- Verbalizes Understanding      Other: -Provides group and verbal instruction on various topics (see comments)   Knowledge Questionnaire Score:  Knowledge Questionnaire Score - 06/29/20 1501      Knowledge Questionnaire Score   Pre Score 19/26 exercise, nutrition           Core Components/Risk Factors/Patient Goals at Admission:  Personal Goals and Risk Factors at Admission - 06/29/20 1504      Core Components/Risk Factors/Patient Goals on Admission    Weight Management Yes    Intervention Weight Management: Develop a combined nutrition and exercise program designed to reach desired caloric intake, while maintaining appropriate intake of nutrient and fiber, sodium and fats, and appropriate energy expenditure required for the weight goal.;Weight Management: Provide education and appropriate resources to help participant work on and attain dietary goals.    Admit Weight 190 lb 11.2 oz (86.5 kg)    Goal Weight: Short Term 185 lb (83.9 kg)    Goal Weight: Long Term 180 lb (81.6 kg)    Expected Outcomes Short Term: Continue to assess and modify interventions until short term weight is achieved;Long Term: Adherence to nutrition and physical activity/exercise program aimed toward attainment of established weight goal;Weight Maintenance: Understanding of the daily nutrition guidelines, which includes 25-35% calories from fat, 7% or less cal from saturated fats, less than 230m cholesterol, less than 1.5gm of sodium, & 5 or more servings of fruits and vegetables daily;Understanding  recommendations for meals to include 15-35% energy as protein, 25-35% energy from fat, 35-60% energy from carbohydrates, less than 224m of dietary cholesterol, 20-35 gm of total fiber  daily;Understanding of distribution of calorie intake throughout the day with the consumption of 4-5 meals/snacks;Weight Gain: Understanding of general recommendations for a high calorie, high protein meal plan that promotes weight gain by distributing calorie intake throughout the day with the consumption for 4-5 meals, snacks, and/or supplements    Heart Failure Yes    Intervention Provide a combined exercise and nutrition program that is supplemented with education, support and counseling about heart failure. Directed toward relieving symptoms such as shortness of breath, decreased exercise tolerance, and extremity edema.    Expected Outcomes Improve functional capacity of life;Short term: Attendance in program 2-3 days a week with increased exercise capacity. Reported lower sodium intake. Reported increased fruit and vegetable intake. Reports medication compliance.;Short term: Daily weights obtained and reported for increase. Utilizing diuretic protocols set by physician.;Long term: Adoption of self-care skills and reduction of barriers for early signs and symptoms recognition and intervention leading to self-care maintenance.    Hypertension Yes    Intervention Provide education on lifestyle modifcations including regular physical activity/exercise, weight management, moderate sodium restriction and increased consumption of fresh fruit, vegetables, and low fat dairy, alcohol moderation, and smoking cessation.;Monitor prescription use compliance.    Expected Outcomes Short Term: Continued assessment and intervention until BP is < 140/939mHG in hypertensive participants. < 130/8014mG in hypertensive participants with diabetes, heart failure or chronic kidney disease.;Long Term: Maintenance of blood pressure at goal levels.    Lipids Yes    Intervention Provide education and support for participant on nutrition & aerobic/resistive exercise along with prescribed medications to achieve LDL <65m41mDL  >40mg36m Expected Outcomes Short Term: Participant states understanding of desired cholesterol values and is compliant with medications prescribed. Participant is following exercise prescription and nutrition guidelines.;Long Term: Cholesterol controlled with medications as prescribed, with individualized exercise RX and with personalized nutrition plan. Value goals: LDL < 65mg,50m > 40 mg.           Education:Diabetes - Individual verbal and written instruction to review signs/symptoms of diabetes, desired ranges of glucose level fasting, after meals and with exercise. Acknowledge that pre and post exercise glucose checks will be done for 3 sessions at entry of program.   Core Components/Risk Factors/Patient Goals Review:   Goals and Risk Factor Review    Row Name 08/02/20 1122 08/27/20 1141           Core Components/Risk Factors/Patient Goals Review   Personal Goals Review Heart Failure;Hypertension;Lipids;Weight Management/Obesity Heart Failure;Hypertension;Lipids;Weight Management/Obesity;Improve shortness of breath with ADL's      Review DebbieJackelyn Polingtly was discharged from the hospital as she had 1.4 L of fluid removed. She reports she feels much better and was able to walk while shopping over the weekend with no SOB. Denies symptoms with Heart Failure. Weight has been stable at maintained around 188 lbs. She does weigh herself every morning and knows to monitor significant weight gain. She does not take her BP at home but does have a cuff to use which she will start to monitor. At doctor appointments and rehab, her BP has been stable. Stays compliant with all her medications. She reports not holding any fluid since her hospital admission - she is still taking her lasix as prescribed. She reports her SOB is better with her ADLs - she can now walk  around the food store, she practices PLB. She is still weighing herself every morning. She is now maintaining weight - working with RD to make  sure she will gain muscle tissue back after unexplained weight loss due to poor appetite). Discussed heart healthy eating to help manage lipid levels with RD during intial appointment today. She has a BP cuff, but doesn't check at home - encouraged her to do so. She is taking all of her medication as directed.      Expected Outcomes Short: Starting taking BP at home/ keep a log Long: Continue to manage lifestyle factors Short: Starting taking BP at home/ keep a log when not in rehab Long: Continue to manage lifestyle factors             Core Components/Risk Factors/Patient Goals at Discharge (Final Review):   Goals and Risk Factor Review - 08/27/20 1141      Core Components/Risk Factors/Patient Goals Review   Personal Goals Review Heart Failure;Hypertension;Lipids;Weight Management/Obesity;Improve shortness of breath with ADL's    Review She reports not holding any fluid since her hospital admission - she is still taking her lasix as prescribed. She reports her SOB is better with her ADLs - she can now walk around the food store, she practices PLB. She is still weighing herself every morning. She is now maintaining weight - working with RD to make sure she will gain muscle tissue back after unexplained weight loss due to poor appetite). Discussed heart healthy eating to help manage lipid levels with RD during intial appointment today. She has a BP cuff, but doesn't check at home - encouraged her to do so. She is taking all of her medication as directed.    Expected Outcomes Short: Starting taking BP at home/ keep a log when not in rehab Long: Continue to manage lifestyle factors           ITP Comments:  ITP Comments    Row Name 06/25/20 1301 06/29/20 1514 06/30/20 1133 07/14/20 0642 07/21/20 1437   ITP Comments Initial telephone encounter completed. Diagnosis can be found CHL 8/23. EP orientation scheduled for 10/19 at 1:30 Completed 6MWT and gym orientation. Initial ITP created and sent for  review to Dr. Emily Filbert, Medical Director. First full day of exercise!  Patient was oriented to gym and equipment including functions, settings, policies, and procedures.  Patient's individual exercise prescription and treatment plan were reviewed.  All starting workloads were established based on the results of the 6 minute walk test done at initial orientation visit.  The plan for exercise progression was also introduced and progression will be customized based on patient's performance and goals 30 Day review completed. Medical Director ITP review done, changes made as directed, and signed approval by Medical Director. Has been out with hospitalization.  Has follow up appt on 11/15.   [place] Name 08/11/20 0946 08/18/20 0915 09/08/20 0557       ITP Comments 30 Day review completed. Medical Director ITP review done, changes made as directed, and signed approval by Medical Director. Brakes out on car, hopes to return on Friday. 30 Day review completed. Medical Director ITP review done, changes made as directed, and signed approval by Medical Director.            Comments:

## 2020-09-10 ENCOUNTER — Other Ambulatory Visit: Payer: Self-pay | Admitting: Internal Medicine

## 2020-09-13 ENCOUNTER — Encounter: Payer: Medicare Other | Attending: Internal Medicine

## 2020-09-13 DIAGNOSIS — Z955 Presence of coronary angioplasty implant and graft: Secondary | ICD-10-CM | POA: Insufficient documentation

## 2020-09-14 ENCOUNTER — Encounter: Payer: Self-pay | Admitting: *Deleted

## 2020-09-14 ENCOUNTER — Ambulatory Visit: Payer: Medicare Other

## 2020-09-14 ENCOUNTER — Other Ambulatory Visit: Payer: Medicare Other

## 2020-09-14 DIAGNOSIS — Z955 Presence of coronary angioplasty implant and graft: Secondary | ICD-10-CM

## 2020-09-15 ENCOUNTER — Other Ambulatory Visit: Payer: Self-pay | Admitting: Family Medicine

## 2020-09-21 ENCOUNTER — Telehealth: Payer: Self-pay | Admitting: *Deleted

## 2020-09-21 ENCOUNTER — Encounter: Payer: Self-pay | Admitting: *Deleted

## 2020-09-21 DIAGNOSIS — Z955 Presence of coronary angioplasty implant and graft: Secondary | ICD-10-CM

## 2020-09-21 NOTE — Progress Notes (Signed)
Cardiac Individual Treatment Plan  Patient Details  Name: [name] [name] MRN: [id number] Date of Birth: [birth year] Referring Provider:   Flowsheet Row Cardiac Rehab from 06/29/2020 in [facility] Cardiac and Pulmonary Rehab  Referring Provider End      Initial Encounter Date:  Flowsheet Row Cardiac Rehab from 06/29/2020 in [facility] Cardiac and Pulmonary Rehab  Date 06/29/20      Visit Diagnosis: Status post coronary artery stent placement  Patient's Home Medications on Admission:  Current Outpatient Medications:  .  aspirin EC 81 MG tablet, Take 81 mg by mouth daily., Disp: , Rfl:  .  Calcium Carbonate-Vitamin D 600-400 MG-UNIT tablet, Take 1 tablet by mouth daily. , Disp: , Rfl:  .  carvedilol (COREG) 6.25 MG tablet, TAKE 1 TABLET (6.25 MG TOTAL) BY MOUTH 2 (TWO) TIMES DAILY WITH A MEAL., Disp: 180 tablet, Rfl: 1 .  cetirizine (ZYRTEC) 10 MG tablet, Take 10 mg by mouth at bedtime. , Disp: , Rfl:  .  clopidogrel (PLAVIX) 75 MG tablet, Take 1 tablet (75 mg total) by mouth daily., Disp: 30 tablet, Rfl: 6 .  dapagliflozin propanediol (FARXIGA) 10 MG TABS tablet, Take 1 tablet (10 mg total) by mouth daily., Disp: 90 tablet, Rfl: 3 .  ENTRESTO 49-51 MG, TAKE 1 TABLET BY MOUTH TWICE A DAY, Disp: 60 tablet, Rfl: 5 .  FLUoxetine (PROZAC) 20 MG capsule, Take 1 capsule by mouth once daily, Disp: 90 capsule, Rfl: 0 .  furosemide (LASIX) 40 MG tablet, TAKE 1 TABLET BY MOUTH EVERY DAY, Disp: 90 tablet, Rfl: 1 .  GRAPE SEED EXTRACT PO, Take 1 capsule by mouth 2 (two) times daily. , Disp: , Rfl:  .  Misc Natural Products (OSTEO BI-FLEX ADV JOINT SHIELD) TABS, Take 1 tablet by mouth 2 (two) times daily., Disp: , Rfl:  .  Multiple Vitamin (MULTIVITAMIN WITH MINERALS) TABS tablet, Take 1 tablet by mouth daily., Disp: , Rfl:  .  rosuvastatin (CRESTOR) 10 MG tablet, TAKE 1 TABLET BY MOUTH EVERY DAY, Disp: 90 tablet, Rfl: 1 .  spironolactone (ALDACTONE) 25 MG tablet, TAKE 1 TABLET BY MOUTH EVERY DAY, Disp: 90  tablet, Rfl: 3  Past Medical History: Past Medical History:  Diagnosis Date  . Allergic rhinitis   . Breast cancer [facility]) 2004   Left Breast Cancer  . CHF (congestive heart failure) ([place])   . Coronary artery disease    a. 10/2012 ETT: nl; b. 12/2012 Cath: LM nl, LAD min irregs, D1 min irregs, LCX 30p, RCA 72m RPDA 40p, EF 50%.  . Diastolic dysfunction    a. 12/2012 Echo: EF 50-55%. No rwma. Gr1 DD. Nl RV fxn. Nl PASP.  .[name] KitchenHistory of hepatitis B    ?--tested + by [employer] . HLD (hyperlipidemia)   . HTN (hypertension)   . Osteoarthritis   . Osteopenia     Tobacco Use: Social History   Tobacco Use  Smoking Status Former Smoker  . Packs/day: 0.50  . Years: 10.00  . Pack years: 5.00  . 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 Quit date: 01/24/1986  Tobacco Comment   1989 quit     Labs: Recent Review Flowsheet Data    Labs for ITP Cardiac and Pulmonary Rehab Latest Ref Rng & Units 12/19/2017 04/23/2019 11/29/2019 05/04/2020 08/26/2020   Cholestrol 0 - 200 mg/dL - 330(H) 155 100 187   LDLCALC 0 - 99 mg/dL - - 87 48 -   LDLDIRECT mg/dL - 76.0 - - 50.0  HDL >39.00 mg/dL - 41.20 35(L) 39(L) 46.60   Trlycerides 0.0 - 149.0 mg/dL - 439.0 Triglyceride is over 400; calculations on Lipids are invalid.(H) 165(H) 64 262.0(H)   Hemoglobin A1c 4.6 - 6.5 % 5.9 5.7 - - -       Exercise Target Goals: Exercise Program Goal: Individual exercise prescription set using results from initial 6 min walk test and THRR while considering  patient's activity barriers and safety.   Exercise Prescription Goal: Initial exercise prescription builds to 30-45 minutes a day of aerobic activity, 2-3 days per week.  Home exercise guidelines will be given to patient during program as part of exercise prescription that the participant will acknowledge.   Education: Aerobic Exercise: - Group verbal and visual presentation on the components of exercise prescription. Introduces F.I.T.T principle from  ACSM for exercise prescriptions.  Reviews F.I.T.T. principles of aerobic exercise including progression. Written material given at graduation. Flowsheet Row Cardiac Rehab from 07/07/2020 in [facility] Cardiac and Pulmonary Rehab  Date 07/07/20  [facility] 07/07/20,  Aerobic 06/30/20]  Educator [facility]  Instruction Review Code 1- Verbalizes Understanding      Education: Resistance Exercise: - Group verbal and visual presentation on the components of exercise prescription. Introduces F.I.T.T principle from ACSM for exercise prescriptions  Reviews F.I.T.T. principles of resistance exercise including progression. Written material given at graduation.    Education: Exercise & Equipment Safety: - Individual verbal instruction and demonstration of equipment use and safety with use of the equipment. Flowsheet Row Cardiac Rehab from 07/07/2020 in [facility] Cardiac and Pulmonary Rehab  Date 06/29/20  Educator AS  Instruction Review Code 1- Verbalizes Understanding      Education: Exercise Physiology & General Exercise Guidelines: - Group verbal and written instruction with models to review the exercise physiology of the cardiovascular system and associated critical values. Provides general exercise guidelines with specific guidelines to those with heart or lung disease.    Education: Flexibility, Balance, Mind/Body Relaxation: - Group verbal and visual presentation with interactive activity on the components of exercise prescription. Introduces F.I.T.T principle from ACSM for exercise prescriptions. Reviews F.I.T.T. principles of flexibility and balance exercise training including progression. Also discusses the mind body connection.  Reviews various relaxation techniques to help reduce and manage stress (i.e. Deep breathing, progressive muscle relaxation, and visualization). Balance handout provided to take home. Written material given at graduation.   Activity Barriers & Risk Stratification:  Activity  Barriers & Cardiac Risk Stratification - 06/25/20 1304      Activity Barriers & Cardiac Risk Stratification   Activity Barriers Right Hip Replacement;Shortness of Breath    Cardiac Risk Stratification High           6 Minute Walk:  6 Minute Walk    Row Name 06/29/20 1443         6 Minute Walk   Phase Initial     Distance 700 feet     Walk Time 4.5 minutes     # of Rest Breaks 3     MPH 1.76     METS 1.92     RPE 15     Perceived Dyspnea  3     VO2 Peak 6.7     Symptoms Yes (comment)     Comments SOB     Resting HR 82 bpm     Resting BP 102/64     Resting Oxygen Saturation  94 %     Exercise Oxygen Saturation  during 6 min walk 94 %  Max Ex. HR 111 bpm     Max Ex. BP 152/74     2 Minute Post BP 104/68            Oxygen Initial Assessment:   Oxygen Re-Evaluation:   Oxygen Discharge (Final Oxygen Re-Evaluation):   Initial Exercise Prescription:  Initial Exercise Prescription - 06/29/20 1400      Date of Initial Exercise RX and Referring Provider   Date 06/29/20    Referring Provider End      Treadmill   MPH 1.2    Grade 0    Minutes 15    METs 1.9      Recumbant Bike   Level 1    RPM 60    Minutes 15    METs 1.9      NuStep   Level 1    SPM 80    Minutes 15    METs 1.9      REL-XR   Level 1    Speed 50    Minutes 15    METs 1.9      T5 Nustep   Level 1    SPM 80    Minutes 15    METs 1.9      Prescription Details   Frequency (times per week) 3    Duration Progress to 30 minutes of continuous aerobic without signs/symptoms of physical distress      Intensity   THRR 40-80% of Max Heartrate 110-137    Ratings of Perceived Exertion 11-15    Perceived Dyspnea 0-4           Perform Capillary Blood Glucose checks as needed.  Exercise Prescription Changes:  Exercise Prescription Changes    Row Name 06/29/20 1400 07/07/20 1700 08/02/20 1700 08/18/20 0900 08/31/20 0900     Response to Exercise   Blood Pressure (Admit)  102/64 124/80 98/64 128/70 102/70   Blood Pressure (Exercise) 152/74 158/108 114/80 152/68 100/64   Blood Pressure (Exit) 104/68 122/76 100/58 118/60 120/60   Heart Rate (Admit) 82 bpm 104 bpm 80 bpm 70 bpm 86 bpm   Heart Rate (Exercise) 111 bpm 133 bpm 115 bpm 108 bpm 112 bpm   Heart Rate (Exit) 84 bpm 103 bpm 88 bpm 88 bpm 75 bpm   Oxygen Saturation (Admit) 94 % -- -- -- --   Oxygen Saturation (Exercise) 94 % -- -- -- --   Rating of Perceived Exertion (Exercise) 15 15 13 13 13    Perceived Dyspnea (Exercise) 3 -- -- -- --   Symptoms SOB none none none none   Duration -- Progress to 30 minutes of  aerobic without signs/symptoms of physical distress Progress to 30 minutes of  aerobic without signs/symptoms of physical distress Continue with 30 min of aerobic exercise without signs/symptoms of physical distress. Continue with 30 min of aerobic exercise without signs/symptoms of physical distress.   Intensity -- THRR unchanged THRR unchanged THRR unchanged THRR unchanged     Progression   Progression -- Continue to progress workloads to maintain intensity without signs/symptoms of physical distress. Continue to progress workloads to maintain intensity without signs/symptoms of physical distress. Continue to progress workloads to maintain intensity without signs/symptoms of physical distress. Continue to progress workloads to maintain intensity without signs/symptoms of physical distress.   Average METs -- 1.92 2.46 --  no METs to report 2.1     Resistance Training   Weight -- 3 3 3 3    Reps -- 10-15 10-15 10-15 10-15  Interval Training   Interval Training -- No No No No     Treadmill   MPH -- 1.2 1.3 -- --   Grade -- 0 0 -- --   Minutes -- 15 15 -- --   METs -- 1.9 1.92 -- --     Recumbant Bike   Level -- -- -- 2 1   Minutes -- -- -- 15 15   METs -- -- -- -- 1.8     NuStep   Level -- -- -- -- 3   SPM -- -- -- -- 80   Minutes -- -- -- -- 15   METs -- -- -- -- 2.4     Arm  Ergometer   Level -- -- -- 1 --   Minutes -- -- -- 15 --     REL-XR   Level -- 1 1 -- --   Speed -- 50 50 -- --   Minutes -- 15 15 -- --   METs -- -- 3 -- --          Exercise Comments:   Exercise Goals and Review:  Exercise Goals    Row Name 06/29/20 1459             Exercise Goals   Increase Physical Activity Yes       Intervention Provide advice, education, support and counseling about physical activity/exercise needs.;Develop an individualized exercise prescription for aerobic and resistive training based on initial evaluation findings, risk stratification, comorbidities and participant's personal goals.       Expected Outcomes Short Term: Attend rehab on a regular basis to increase amount of physical activity.;Long Term: Add in home exercise to make exercise part of routine and to increase amount of physical activity.;Long Term: Exercising regularly at least 3-5 days a week.       Increase Strength and Stamina Yes       Intervention Provide advice, education, support and counseling about physical activity/exercise needs.;Develop an individualized exercise prescription for aerobic and resistive training based on initial evaluation findings, risk stratification, comorbidities and participant's personal goals.       Expected Outcomes Short Term: Increase workloads from initial exercise prescription for resistance, speed, and METs.;Short Term: Perform resistance training exercises routinely during rehab and add in resistance training at home;Long Term: Improve cardiorespiratory fitness, muscular endurance and strength as measured by increased METs and functional capacity (6MWT)       Able to understand and use rate of perceived exertion (RPE) scale Yes       Intervention Provide education and explanation on how to use RPE scale       Expected Outcomes Short Term: Able to use RPE daily in rehab to express subjective intensity level;Long Term:  Able to use RPE to guide intensity  level when exercising independently       Able to understand and use Dyspnea scale Yes       Intervention Provide education and explanation on how to use Dyspnea scale       Expected Outcomes Short Term: Able to use Dyspnea scale daily in rehab to express subjective sense of shortness of breath during exertion;Long Term: Able to use Dyspnea scale to guide intensity level when exercising independently       Knowledge and understanding of Target Heart Rate Range (THRR) Yes       Intervention Provide education and explanation of THRR including how the numbers were predicted and where they are located for reference  Expected Outcomes Short Term: Able to state/look up THRR;Short Term: Able to use daily as guideline for intensity in rehab;Long Term: Able to use THRR to govern intensity when exercising independently       Able to check pulse independently Yes       Intervention Provide education and demonstration on how to check pulse in carotid and radial arteries.;Review the importance of being able to check your own pulse for safety during independent exercise       Expected Outcomes Short Term: Able to explain why pulse checking is important during independent exercise;Long Term: Able to check pulse independently and accurately       Understanding of Exercise Prescription Yes       Intervention Provide education, explanation, and written materials on patient's individual exercise prescription       Expected Outcomes Short Term: Able to explain program exercise prescription;Long Term: Able to explain home exercise prescription to exercise independently              Exercise Goals Re-Evaluation :  Exercise Goals Re-Evaluation    Row Name 06/30/20 1133 07/07/20 1715 07/21/20 1438 08/02/20 1118 08/18/20 0916     Exercise Goal Re-Evaluation   Exercise Goals Review Increase Physical Activity;Able to understand and use rate of perceived exertion (RPE) scale;Knowledge and understanding of Target  Heart Rate Range (THRR);Understanding of Exercise Prescription;Increase Strength and Stamina;Able to check pulse independently Increase Physical Activity;Increase Strength and Stamina -- Increase Physical Activity;Increase Strength and Stamina Increase Physical Activity;Increase Strength and Stamina;Understanding of Exercise Prescription   Comments Reviewed RPE and dyspnea scales, THR and program prescription with pt today.  Pt voiced understanding and was given a copy of goals to take home. [name] [name] tolerates exercise well.  She works in [employer] range and RPE 15.  Staff will monitor progress. Out since last review (hospitalized) Patient was recently disharged from the hospital as she had a thoracentesis completed due to her significant SOB. An Exercise Physiologist will review home exercise with patient when ready. Will provide patient time to recover from hospital until review. She did walk to rehab today and felt good doing it. She also walked over the weekend while shopping and did not have to pause or take breaks. Jackelyn Poling is pleased. She does have an elliptical at home that she will plan to use for home exercise. Jackelyn Poling is only on session 8 despite starting in October.  She has had a hospital admission and several transportation issues.  She would benefit more with improved attendance and hopes to return on Friday.  We will continue to monitor her progress.   Expected Outcomes Short: Use RPE daily to regulate intensity. Long: Follow program prescription in THR. Short:  attend consistently Long:  increase overall MET level -- Short: Review home exercise Long: Be able to exercise independently at home with no complications Short: Return to regulary attendance in rehab  Long:  Improve overall stamina.   [place] Name 08/27/20 1133 08/31/20 0925 09/14/20 1601         Exercise Goal Re-Evaluation   Exercise Goals Review Increase Physical Activity;Increase Strength and Stamina;Understanding of Exercise Prescription  Increase Physical Activity;Increase Strength and Stamina --     Comments EP to go over home exercise with [name] [name]. She walks at home (walking her dog) - 3 minutes several times per day (road not flat). She reports after rehab going to the gym abover her apartment. Jackelyn Poling has attended 3 times this month.  She works in lower end of  THR range when she attends.  More consistent attendance will increase progress. Out since last review     Expected Outcomes ST: continue attending rehab regularly, go over home exercise with EP LT: 150 minutes of moderate activity and weights 2x/week. Short: get back to consistent attendance Long:  improve overall stamina --            Discharge Exercise Prescription (Final Exercise Prescription Changes):  Exercise Prescription Changes - 08/31/20 0900      Response to Exercise   Blood Pressure (Admit) 102/70    Blood Pressure (Exercise) 100/64    Blood Pressure (Exit) 120/60    Heart Rate (Admit) 86 bpm    Heart Rate (Exercise) 112 bpm    Heart Rate (Exit) 75 bpm    Rating of Perceived Exertion (Exercise) 13    Symptoms none    Duration Continue with 30 min of aerobic exercise without signs/symptoms of physical distress.    Intensity THRR unchanged      Progression   Progression Continue to progress workloads to maintain intensity without signs/symptoms of physical distress.    Average METs 2.1      Resistance Training   Weight 3    Reps 10-15      Interval Training   Interval Training No      Recumbant Bike   Level 1    Minutes 15    METs 1.8      NuStep   Level 3    SPM 80    Minutes 15    METs 2.4           Nutrition:  Target Goals: Understanding of nutrition guidelines, daily intake of sodium <1510m, cholesterol <2049m calories 30% from fat and 7% or less from saturated fats, daily to have 5 or more servings of fruits and vegetables.  Education: All About Nutrition: -Group instruction provided by verbal, written material, interactive  activities, discussions, models, and posters to present general guidelines for heart healthy nutrition including fat, fiber, MyPlate, the role of sodium in heart healthy nutrition, utilization of the nutrition label, and utilization of this knowledge for meal planning. Follow up email sent as well. Written material given at graduation.   Biometrics:  Pre Biometrics - 06/29/20 1500      Pre Biometrics   Height 5' 5"  (1.651 m)    Weight 190 lb 11.2 oz (86.5 kg)    BMI (Calculated) 31.73    Single Leg Stand 6.87 seconds            Nutrition Therapy Plan and Nutrition Goals:  Nutrition Therapy & Goals - 08/27/20 1112      Nutrition Therapy   Diet Heart healthy, low Na    Protein (specify units) 85g      Personal Nutrition Goals   Nutrition Goal ST: add protein to lunch or have protein source at snack: boiled egg, cottage cheese, beans in soup, nuts or peanut butter, greek yogurt, leftovers from dinner LT: gain muscle    Comments Her appetite is increasing, but it is poor; she believes the increase in exercise and improved breathing has helped. Weight has been stable and she is no longer losing weight (30lbs since march lost unintentionally) - she can tell she lost muscle mass during this time. B: sandwich (chicken salad), boiled eggs, cereal or oatmeal, or biscuit L: pack of snack crackers, can of soup  D: oven chicken, (2 vegetables and meat), can be corn and pasta if her roommate is cooking  because that is all her daughter will eat. her roommate will cook a couple of times per week. She reports liking a lot of different fruits and vegetables. She is on a budget. Discussed meeting protein and calorie needs and heart healthy eating. Discussed additional protein sources for lunch or a snack that has protein.      Intervention Plan   Intervention Prescribe, educate and counsel regarding individualized specific dietary modifications aiming towards targeted core components such as weight,  hypertension, lipid management, diabetes, heart failure and other comorbidities.;Nutrition handout(s) given to patient.    Expected Outcomes Short Term Goal: Understand basic principles of dietary content, such as calories, fat, sodium, cholesterol and nutrients.;Short Term Goal: A plan has been developed with personal nutrition goals set during dietitian appointment.;Long Term Goal: Adherence to prescribed nutrition plan.           Nutrition Assessments:  Nutrition Assessments - 06/29/20 1502      MEDFICTS Scores   Pre Score 27          MEDIFICTS Score Key:  ?70 Need to make dietary changes   40-70 Heart Healthy Diet  ? 40 Therapeutic Level Cholesterol Diet   Picture Your Plate Scores:  <10 Unhealthy dietary pattern with much room for improvement.  41-50 Dietary pattern unlikely to meet recommendations for good health and room for improvement.  51-60 More healthful dietary pattern, with some room for improvement.   >60 Healthy dietary pattern, although there may be some specific behaviors that could be improved.    Nutrition Goals Re-Evaluation:  Nutrition Goals Re-Evaluation    [place] Name 08/02/20 1315             Goals   Comment Jackelyn Poling has not completed her initial assessment with the RD yet, but will get an appointment with her soon as she has been in the hospital.       Expected Outcome Short: Meet with RD for initial evaluation Long: Achieve goals provided by RD              Nutrition Goals Discharge (Final Nutrition Goals Re-Evaluation):  Nutrition Goals Re-Evaluation - 08/02/20 1315      Goals   Comment [name] [name] has not completed her initial assessment with the RD yet, but will get an appointment with her soon as she has been in the hospital.    Expected Outcome Short: Meet with RD for initial evaluation Long: Achieve goals provided by RD           Psychosocial: Target Goals: Acknowledge presence or absence of significant depression and/or stress,  maximize coping skills, provide positive support system. Participant is able to verbalize types and ability to use techniques and skills needed for reducing stress and depression.   Education: Stress, Anxiety, and Depression - Group verbal and visual presentation to define topics covered.  Reviews how body is impacted by stress, anxiety, and depression.  Also discusses healthy ways to reduce stress and to treat/manage anxiety and depression.  Written material given at graduation.   Education: Sleep Hygiene -Provides group verbal and written instruction about how sleep can affect your health.  Define sleep hygiene, discuss sleep cycles and impact of sleep habits. Review good sleep hygiene tips.    Initial Review & Psychosocial Screening:  Initial Psych Review & Screening - 06/25/20 1307      Initial Review   Current issues with None Identified      Family Dynamics   Good Support System? Yes  Barriers   Psychosocial barriers to participate in program There are no identifiable barriers or psychosocial needs.;The patient should benefit from training in stress management and relaxation.      Screening Interventions   Interventions Encouraged to exercise;Provide feedback about the scores to participant;To provide support and resources with identified psychosocial needs    Expected Outcomes Short Term goal: Utilizing psychosocial counselor, staff and physician to assist with identification of specific Stressors or current issues interfering with healing process. Setting desired goal for each stressor or current issue identified.;Long Term Goal: Stressors or current issues are controlled or eliminated.;Short Term goal: Identification and review with participant of any Quality of Life or Depression concerns found by scoring the questionnaire.;Long Term goal: The participant improves quality of Life and PHQ9 Scores as seen by post scores and/or verbalization of changes           Quality of  Life Scores:   Quality of Life - 06/29/20 1502      Quality of Life   Select Quality of Life      Quality of Life Scores   Health/Function Pre 18.88 %    Socioeconomic Pre 24.58 %    Psych/Spiritual Pre 24 %    Family Pre 19.67 %    GLOBAL Pre 21.38 %          Scores of 19 and below usually indicate a poorer quality of life in these areas.  A difference of  2-3 points is a clinically meaningful difference.  A difference of 2-3 points in the total score of the Quality of Life Index has been associated with significant improvement in overall quality of life, self-image, physical symptoms, and general health in studies assessing change in quality of life.  PHQ-9: Recent Review Flowsheet Data    Depression screen [facility] 2/9 08/02/2020 06/29/2020 08/19/2019 12/06/2017   Decreased Interest 0 1 1 1    Down, Depressed, Hopeless 0 1 1 1    PHQ - 2 Score 0 2 2 2    Altered sleeping 0 0 1 1   Tired, decreased energy 1 3  1 1    Change in appetite 1 1 1 1    Feeling bad or failure about yourself  0 1 0 0   Trouble concentrating 1 0 0 0   Moving slowly or fidgety/restless 0 0 0 0   Suicidal thoughts 0 0 0 0   PHQ-9 Score 3 7 5 5    Difficult doing work/chores Somewhat difficult - Not difficult at all Not difficult at all     Interpretation of Total Score  Total Score Depression Severity:  1-4 = Minimal depression, 5-9 = Mild depression, 10-14 = Moderate depression, 15-19 = Moderately severe depression, 20-27 = Severe depression   Psychosocial Evaluation and Intervention:  Psychosocial Evaluation - 06/25/20 1312      Psychosocial Evaluation & Interventions   Interventions Encouraged to exercise with the program and follow exercise prescription    Comments Jackelyn Poling reports doing okay post stent. She is really struggling with her shortness of breath and her stamina. She hopes to improve with coming to rehab so she can walk her dog and around the store. She states she is a pretty easy going person with  little stress. A family that needed a place to stay currently is staying with her and keeps her company.    Expected Outcomes Short: attend cardiac rehab for education and exercise. Long: develop positive self care habits.    Continue Psychosocial Services  Follow up  required by staff           Psychosocial Re-Evaluation:  Psychosocial Re-Evaluation    [place] Name 08/02/20 1135 08/27/20 1137           Psychosocial Re-Evaluation   Current issues with None Identified --      Comments Jackelyn Poling denies any concerns at this time. She reports she sleeps well and is currently taking  Fluoxetine, more for hot flashses, but stays compliant and feels it helps her. She enjoys exercising and can already tell her stamina has increased. Her biggest stressor was her SOB, however, she recently had a thoracentsis completed and reports feeling better and being able to complete her tasks at home. Her PHQ improved from a 7 to a 3! [name] [name] reports no mental health concerns at this time. She reports still sleeping (7-8 hours per night). She is still taking her fluoxetine. She can tell the exercise has been helping her appetite and breathing. She will meditate and take deep breaths when she is stressed. She quilts to reudce stress. She reports her family (sister is down the road) and her roommate help support her (she has no children and her husband passed away)      Expected Outcomes Short: Continue attending rehab Long: Maintain positive attitude and utilize exercise for stress management Short: Continue attending rehab Long: Maintain positive attitude and utilize exercise for stress management      Interventions Encouraged to attend Cardiac Rehabilitation for the exercise Encouraged to attend Cardiac Rehabilitation for the exercise      Continue Psychosocial Services  Follow up required by staff Follow up required by staff             Psychosocial Discharge (Final Psychosocial Re-Evaluation):  Psychosocial  Re-Evaluation - 08/27/20 1137      Psychosocial Re-Evaluation   Comments Jackelyn Poling reports no mental health concerns at this time. She reports still sleeping (7-8 hours per night). She is still taking her fluoxetine. She can tell the exercise has been helping her appetite and breathing. She will meditate and take deep breaths when she is stressed. She quilts to reudce stress. She reports her family (sister is down the road) and her roommate help support her (she has no children and her husband passed away)    Expected Outcomes Short: Continue attending rehab Long: Maintain positive attitude and utilize exercise for stress management    Interventions Encouraged to attend Cardiac Rehabilitation for the exercise    Continue Psychosocial Services  Follow up required by staff           Vocational Rehabilitation: Provide vocational rehab assistance to qualifying candidates.   Vocational Rehab Evaluation & Intervention:  Vocational Rehab - 06/25/20 1306      Initial Vocational Rehab Evaluation & Intervention   Assessment shows need for Vocational Rehabilitation No           Education: Education Goals: Education classes will be provided on a variety of topics geared toward better understanding of heart health and risk factor modification. Participant will state understanding/return demonstration of topics presented as noted by education test scores.  Learning Barriers/Preferences:  Learning Barriers/Preferences - 06/25/20 1306      Learning Barriers/Preferences   Learning Barriers None    Learning Preferences None           General Cardiac Education Topics:  AED/CPR: - Group verbal and written instruction with the use of models to demonstrate the basic use of the AED with the basic ABC's of resuscitation.  Anatomy and Cardiac Procedures: - Group verbal and visual presentation and models provide information about basic cardiac anatomy and function. Reviews the testing methods done  to diagnose heart disease and the outcomes of the test results. Describes the treatment choices: Medical Management, Angioplasty, or Coronary Bypass Surgery for treating various heart conditions including Myocardial Infarction, Angina, Valve Disease, and Cardiac Arrhythmias.  Written material given at graduation. Flowsheet Row Cardiac Rehab from 07/07/2020 in [facility] Cardiac and Pulmonary Rehab  Date 07/07/20  Educator [facility]  Instruction Review Code 1- Verbalizes Understanding      Medication Safety: - Group verbal and visual instruction to review commonly prescribed medications for heart and lung disease. Reviews the medication, class of the drug, and side effects. Includes the steps to properly store meds and maintain the prescription regimen.  Written material given at graduation.   Intimacy: - Group verbal instruction through game format to discuss how heart and lung disease can affect sexual intimacy. Written material given at graduation.. Flowsheet Row Cardiac Rehab from 07/07/2020 in [facility] Cardiac and Pulmonary Rehab  Date 06/30/20  Educator [facility]  Instruction Review Code 1- Verbalizes Understanding      Know Your Numbers and Heart Failure: - Group verbal and visual instruction to discuss disease risk factors for cardiac and pulmonary disease and treatment options.  Reviews associated critical values for Overweight/Obesity, Hypertension, Cholesterol, and Diabetes.  Discusses basics of heart failure: signs/symptoms and treatments.  Introduces Heart Failure Zone chart for action plan for heart failure.  Written material given at graduation.   Infection Prevention: - Provides verbal and written material to individual with discussion of infection control including proper hand washing and proper equipment cleaning during exercise session. Flowsheet Row Cardiac Rehab from 07/07/2020 in [facility] Cardiac and Pulmonary Rehab  Date 06/29/20  Educator AS  Instruction Review Code 1- Verbalizes  Understanding      Falls Prevention: - Provides verbal and written material to individual with discussion of falls prevention and safety. Flowsheet Row Cardiac Rehab from 07/07/2020 in [facility] Cardiac and Pulmonary Rehab  Date 06/29/20  Educator AS  Instruction Review Code 1- Verbalizes Understanding      Other: -Provides group and verbal instruction on various topics (see comments)   Knowledge Questionnaire Score:  Knowledge Questionnaire Score - 06/29/20 1501      Knowledge Questionnaire Score   Pre Score 19/26 exercise, nutrition           Core Components/Risk Factors/Patient Goals at Admission:  Personal Goals and Risk Factors at Admission - 06/29/20 1504      Core Components/Risk Factors/Patient Goals on Admission    Weight Management Yes    Intervention Weight Management: Develop a combined nutrition and exercise program designed to reach desired caloric intake, while maintaining appropriate intake of nutrient and fiber, sodium and fats, and appropriate energy expenditure required for the weight goal.;Weight Management: Provide education and appropriate resources to help participant work on and attain dietary goals.    Admit Weight 190 lb 11.2 oz (86.5 kg)    Goal Weight: Short Term 185 lb (83.9 kg)    Goal Weight: Long Term 180 lb (81.6 kg)    Expected Outcomes Short Term: Continue to assess and modify interventions until short term weight is achieved;Long Term: Adherence to nutrition and physical activity/exercise program aimed toward attainment of established weight goal;Weight Maintenance: Understanding of the daily nutrition guidelines, which includes 25-35% calories from fat, 7% or less cal from saturated fats, less than 227m cholesterol, less than 1.5gm of  sodium, & 5 or more servings of fruits and vegetables daily;Understanding recommendations for meals to include 15-35% energy as protein, 25-35% energy from fat, 35-60% energy from carbohydrates, less than 242m of  dietary cholesterol, 20-35 gm of total fiber daily;Understanding of distribution of calorie intake throughout the day with the consumption of 4-5 meals/snacks;Weight Gain: Understanding of general recommendations for a high calorie, high protein meal plan that promotes weight gain by distributing calorie intake throughout the day with the consumption for 4-5 meals, snacks, and/or supplements    Heart Failure Yes    Intervention Provide a combined exercise and nutrition program that is supplemented with education, support and counseling about heart failure. Directed toward relieving symptoms such as shortness of breath, decreased exercise tolerance, and extremity edema.    Expected Outcomes Improve functional capacity of life;Short term: Attendance in program 2-3 days a week with increased exercise capacity. Reported lower sodium intake. Reported increased fruit and vegetable intake. Reports medication compliance.;Short term: Daily weights obtained and reported for increase. Utilizing diuretic protocols set by physician.;Long term: Adoption of self-care skills and reduction of barriers for early signs and symptoms recognition and intervention leading to self-care maintenance.    Hypertension Yes    Intervention Provide education on lifestyle modifcations including regular physical activity/exercise, weight management, moderate sodium restriction and increased consumption of fresh fruit, vegetables, and low fat dairy, alcohol moderation, and smoking cessation.;Monitor prescription use compliance.    Expected Outcomes Short Term: Continued assessment and intervention until BP is < 140/974mHG in hypertensive participants. < 130/8076mG in hypertensive participants with diabetes, heart failure or chronic kidney disease.;Long Term: Maintenance of blood pressure at goal levels.    Lipids Yes    Intervention Provide education and support for participant on nutrition & aerobic/resistive exercise along with  prescribed medications to achieve LDL <49m25mDL >40mg7m Expected Outcomes Short Term: Participant states understanding of desired cholesterol values and is compliant with medications prescribed. Participant is following exercise prescription and nutrition guidelines.;Long Term: Cholesterol controlled with medications as prescribed, with individualized exercise RX and with personalized nutrition plan. Value goals: LDL < 49mg,68m > 40 mg.           Education:Diabetes - Individual verbal and written instruction to review signs/symptoms of diabetes, desired ranges of glucose level fasting, after meals and with exercise. Acknowledge that pre and post exercise glucose checks will be done for 3 sessions at entry of program.   Core Components/Risk Factors/Patient Goals Review:   Goals and Risk Factor Review    Row Name 08/02/20 1122 08/27/20 1141           Core Components/Risk Factors/Patient Goals Review   Personal Goals Review Heart Failure;Hypertension;Lipids;Weight Management/Obesity Heart Failure;Hypertension;Lipids;Weight Management/Obesity;Improve shortness of breath with ADL's      Review DebbieJackelyn Polingtly was discharged from the hospital as she had 1.4 L of fluid removed. She reports she feels much better and was able to walk while shopping over the weekend with no SOB. Denies symptoms with Heart Failure. Weight has been stable at maintained around 188 lbs. She does weigh herself every morning and knows to monitor significant weight gain. She does not take her BP at home but does have a cuff to use which she will start to monitor. At doctor appointments and rehab, her BP has been stable. Stays compliant with all her medications. She reports not holding any fluid since her hospital admission - she is still taking her lasix as prescribed. She reports her  SOB is better with her ADLs - she can now walk around the food store, she practices PLB. She is still weighing herself every morning. She is  now maintaining weight - working with RD to make sure she will gain muscle tissue back after unexplained weight loss due to poor appetite). Discussed heart healthy eating to help manage lipid levels with RD during intial appointment today. She has a BP cuff, but doesn't check at home - encouraged her to do so. She is taking all of her medication as directed.      Expected Outcomes Short: Starting taking BP at home/ keep a log Long: Continue to manage lifestyle factors Short: Starting taking BP at home/ keep a log when not in rehab Long: Continue to manage lifestyle factors             Core Components/Risk Factors/Patient Goals at Discharge (Final Review):   Goals and Risk Factor Review - 08/27/20 1141      Core Components/Risk Factors/Patient Goals Review   Personal Goals Review Heart Failure;Hypertension;Lipids;Weight Management/Obesity;Improve shortness of breath with ADL's    Review She reports not holding any fluid since her hospital admission - she is still taking her lasix as prescribed. She reports her SOB is better with her ADLs - she can now walk around the food store, she practices PLB. She is still weighing herself every morning. She is now maintaining weight - working with RD to make sure she will gain muscle tissue back after unexplained weight loss due to poor appetite). Discussed heart healthy eating to help manage lipid levels with RD during intial appointment today. She has a BP cuff, but doesn't check at home - encouraged her to do so. She is taking all of her medication as directed.    Expected Outcomes Short: Starting taking BP at home/ keep a log when not in rehab Long: Continue to manage lifestyle factors           ITP Comments:  ITP Comments    Row Name 06/25/20 1301 06/29/20 1514 06/30/20 1133 07/14/20 0642 07/21/20 1437   ITP Comments Initial telephone encounter completed. Diagnosis can be found CHL 8/23. EP orientation scheduled for 10/19 at 1:30 Completed 6MWT and gym  orientation. Initial ITP created and sent for review to Dr. Emily Filbert, Medical Director. First full day of exercise!  Patient was oriented to gym and equipment including functions, settings, policies, and procedures.  Patient's individual exercise prescription and treatment plan were reviewed.  All starting workloads were established based on the results of the 6 minute walk test done at initial orientation visit.  The plan for exercise progression was also introduced and progression will be customized based on patient's performance and goals 30 Day review completed. Medical Director ITP review done, changes made as directed, and signed approval by Medical Director. Has been out with hospitalization.  Has follow up appt on 11/15.   [place] Name 08/11/20 0946 08/18/20 0915 09/08/20 0557 09/14/20 1601 09/21/20 1435   ITP Comments 30 Day review completed. Medical Director ITP review done, changes made as directed, and signed approval by Medical Director. Brakes out on car, hopes to return on Friday. 30 Day review completed. Medical Director ITP review done, changes made as directed, and signed approval by Medical Director. Out with GI bleed Jackelyn Poling is still having some medical issues going on.  She still having some problems with her breathing and coughing. She would like to go ahead and discharge at this time and can  get to the gym closer to home more frequently.  We will take her out at this time.          Comments: Discharge ITP

## 2020-09-21 NOTE — Telephone Encounter (Signed)
[name] [name] is still having some medical issues going on.  [name] [name] still having some problems with her breathing and coughing. [name] [name] would like to go ahead and discharge at this time and can get to the gym closer to home more frequently.  We will take her out at this time.

## 2020-09-21 NOTE — Progress Notes (Signed)
Discharge Progress Report  Patient Details  Name: [name] [name] MRN: [id number] Date of Birth: [birth year] Referring Provider:   Flowsheet Row Cardiac Rehab from 06/29/2020 in [facility] Cardiac and Pulmonary Rehab  Referring Provider End       Number of Visits: 12  Reason for Discharge:  Early Exit:  Personal and Lack of attendance  Smoking History:  Social History   Tobacco Use  Smoking Status Former Smoker  . Packs/day: 0.50  . Years: 10.00  . Pack years: 5.00  . Types: Cigarettes  Smokeless Tobacco Former Systems developer  . Quit date: 01/24/1986  Tobacco Comment   1989 quit     Diagnosis:  Status post coronary artery stent placement  ADL UCSD:   Initial Exercise Prescription:  Initial Exercise Prescription - 06/29/20 1400      Date of Initial Exercise RX and Referring Provider   Date 06/29/20    Referring Provider End      Treadmill   MPH 1.2    Grade 0    Minutes 15    METs 1.9      Recumbant Bike   Level 1    RPM 60    Minutes 15    METs 1.9      NuStep   Level 1    SPM 80    Minutes 15    METs 1.9      REL-XR   Level 1    Speed 50    Minutes 15    METs 1.9      T5 Nustep   Level 1    SPM 80    Minutes 15    METs 1.9      Prescription Details   Frequency (times per week) 3    Duration Progress to 30 minutes of continuous aerobic without signs/symptoms of physical distress      Intensity   THRR 40-80% of Max Heartrate 110-137    Ratings of Perceived Exertion 11-15    Perceived Dyspnea 0-4           Discharge Exercise Prescription (Final Exercise Prescription Changes):  Exercise Prescription Changes - 08/31/20 0900      Response to Exercise   Blood Pressure (Admit) 102/70    Blood Pressure (Exercise) 100/64    Blood Pressure (Exit) 120/60    Heart Rate (Admit) 86 bpm    Heart Rate (Exercise) 112 bpm    Heart Rate (Exit) 75 bpm    Rating of Perceived Exertion (Exercise) 13    Symptoms none    Duration Continue with 30 min of  aerobic exercise without signs/symptoms of physical distress.    Intensity THRR unchanged      Progression   Progression Continue to progress workloads to maintain intensity without signs/symptoms of physical distress.    Average METs 2.1      Resistance Training   Weight 3    Reps 10-15      Interval Training   Interval Training No      Recumbant Bike   Level 1    Minutes 15    METs 1.8      NuStep   Level 3    SPM 80    Minutes 15    METs 2.4           Functional Capacity:  6 Minute Walk    Row Name 06/29/20 1443         6 Minute Walk   Phase Initial  Distance 700 feet     Walk Time 4.5 minutes     # of Rest Breaks 3     MPH 1.76     METS 1.92     RPE 15     Perceived Dyspnea  3     VO2 Peak 6.7     Symptoms Yes (comment)     Comments SOB     Resting HR 82 bpm     Resting BP 102/64     Resting Oxygen Saturation  94 %     Exercise Oxygen Saturation  during 6 min walk 94 %     Max Ex. HR 111 bpm     Max Ex. BP 152/74     2 Minute Post BP 104/68            Psychological, QOL, Others - Outcomes: PHQ 2/9: Depression screen [facility] 2/9 08/02/2020 06/29/2020 08/19/2019 12/06/2017  Decreased Interest 0 1 1 1   Down, Depressed, Hopeless 0 1 1 1   PHQ - 2 Score 0 2 2 2   Altered sleeping 0 0 1 1  Tired, decreased energy 1 3 1 1   Change in appetite 1 1 1 1   Feeling bad or failure about yourself  0 1 0 0  Trouble concentrating 1 0 0 0  Moving slowly or fidgety/restless 0 0 0 0  Suicidal thoughts 0 0 0 0  PHQ-9 Score 3 7 5 5   Difficult doing work/chores Somewhat difficult - Not difficult at all Not difficult at all    Quality of Life:  Quality of Life - 06/29/20 1502      Quality of Life   Select Quality of Life      Quality of Life Scores   Health/Function Pre 18.88 %    Socioeconomic Pre 24.58 %    Psych/Spiritual Pre 24 %    Family Pre 19.67 %    GLOBAL Pre 21.38 %           Nutrition & Weight - Outcomes:  Pre Biometrics - 06/29/20 1500       Pre Biometrics   Height 5\' 5"  (1.651 m)    Weight 190 lb 11.2 oz (86.5 kg)    BMI (Calculated) 31.73    Single Leg Stand 6.87 seconds            Nutrition:  Nutrition Therapy & Goals - 08/27/20 1112      Nutrition Therapy   Diet Heart healthy, low Na    Protein (specify units) 85g      Personal Nutrition Goals   Nutrition Goal ST: add protein to lunch or have protein source at snack: boiled egg, cottage cheese, beans in soup, nuts or peanut butter, greek yogurt, leftovers from dinner LT: gain muscle    Comments Her appetite is increasing, but it is poor; she believes the increase in exercise and improved breathing has helped. Weight has been stable and she is no longer losing weight (30lbs since march lost unintentionally) - she can tell she lost muscle mass during this time. B: sandwich (chicken salad), boiled eggs, cereal or oatmeal, or biscuit L: pack of snack crackers, can of soup  D: oven chicken, (2 vegetables and meat), can be corn and pasta if her roommate is cooking because that is all her daughter will eat. her roommate will cook a couple of times per week. She reports liking a lot of different fruits and vegetables. She is on a budget. Discussed meeting protein and calorie needs  and heart healthy eating. Discussed additional protein sources for lunch or a snack that has protein.      Intervention Plan   Intervention Prescribe, educate and counsel regarding individualized specific dietary modifications aiming towards targeted core components such as weight, hypertension, lipid management, diabetes, heart failure and other comorbidities.;Nutrition handout(s) given to patient.    Expected Outcomes Short Term Goal: Understand basic principles of dietary content, such as calories, fat, sodium, cholesterol and nutrients.;Short Term Goal: A plan has been developed with personal nutrition goals set during dietitian appointment.;Long Term Goal: Adherence to prescribed nutrition plan.            Nutrition Discharge:  Nutrition Assessments - 06/29/20 1502      MEDFICTS Scores   Pre Score 27           Education Questionnaire Score:  Knowledge Questionnaire Score - 06/29/20 1501      Knowledge Questionnaire Score   Pre Score 19/26 exercise, nutrition           Goals reviewed with patient; copy given to patient.

## 2020-09-23 NOTE — Progress Notes (Deleted)
Patient ID: [name] [name], female    DOB: Apr 16, 1951, 70 y.o.   MRN: [id number]  HPI  [name] [name] is a 70 y/o female with a history of CAD, hyperlipidemia, HTN, breast cancer, previous tobacco use and chronic heart failure.   Echo report from 07/16/20 reviewed and showed an EF of 25-30%. Echo report from 03/01/20 reviewed and showed an EF of 25-30% along with mild MR. Echo report from 11/29/19 reviewed and showed an EF of 25-30%.  LHC done 05/03/20 showed: 1. Severe two-vessel coronary artery disease with diffuse LAD disease of up to 70-80% that is significant by IVUS, as well as CTO of RCA. 2. Upper normal LVEDP. 3. Successful IVUS-guided PCI to mid/distal LAD using overlapping Resolute Onyx 2.5 x 38 mm and 2.25 x 34 mm drug-eluting stents with 0% residual stenosis and TIMI-3 flow.  RHC/LHC on 12/02/19 showed: 4. Significant two-vessel coronary artery disease with diffuse LAD disease of up to 70-80% in the mid/distal vessel and chronic subtotal occlusion of the mid RCA with left-to-right collaterals. 5. Moderately elevated left heart, right heart, and pulmonary artery pressures. 6. Low normal Fick cardiac output/index.  Admitted 07/15/20 due to                 Cardiology consult obtained.                   Discharged after 2 days. Was in the ED 05/23/20 due to arm and back pain. EKG normal and troponin negative. Treated and released. Admitted 05/03/20 due to outpatient cath/PCI/stent placement with observation overnight. Admitted 11/28/19 due to acute HF exacerbation. Cardiology consult obtained. Initially needed IV lasix and then transitioned to oral diuretics. Needed oxygen but was then weaned off of it. Elevated troponin thought to be due to demand ischemia. Discharged after 5 days.   She presents today for a follow-up visit with a chief complaint of   Past Medical History:  Diagnosis Date  . Allergic rhinitis   . Breast cancer [facility]) 2004   Left Breast Cancer  . CHF (congestive heart  failure) ([place])   . Coronary artery disease    a. 10/2012 ETT: nl; b. 12/2012 Cath: LM nl, LAD min irregs, D1 min irregs, LCX 30p, RCA 92m, RPDA 40p, EF 50%.  . Diastolic dysfunction    a. 12/2012 Echo: EF 50-55%. No rwma. Gr1 DD. Nl RV fxn. Nl PASP.  [name] Kitchen History of hepatitis B    ?--tested + by [employer]  . HLD (hyperlipidemia)   . HTN (hypertension)   . Osteoarthritis   . Osteopenia    Past Surgical History:  Procedure Laterality Date  . ABDOMINAL HYSTERECTOMY  1985   endometriosis; anatomy  . BREAST SURGERY  10/2002  . CARDIAC CATHETERIZATION  12/19/12   [facility]; EF 50%  . COLONOSCOPY  8/09   Hyperplastic polyp; repeat in 10 years  . CORONARY STENT INTERVENTION N/A 05/03/2020   Procedure: CORONARY STENT INTERVENTION;  Surgeon: [name], MD;  Location: [place] [facility];  Service: Cardiovascular;  Laterality: N/A;  . HIP FRACTURE SURGERY  06/14/2011   ball replaced (Dr. Earvin Hansen)  . INTRAVASCULAR ULTRASOUND/IVUS N/A 05/03/2020   Procedure: Intravascular Ultrasound/IVUS;  Surgeon: [name], MD;  Location: [place] [facility];  Service: Cardiovascular;  Laterality: N/A;  . LEFT HEART CATH N/A 05/03/2020   Procedure: Left Heart Cath;  Surgeon: [name], MD;  Location: [place] [facility];  Service: Cardiovascular;  Laterality: N/A;  . MASTECTOMY Left 2004  .  RIGHT/LEFT HEART CATH AND CORONARY ANGIOGRAPHY N/A 12/02/2019   Procedure: RIGHT/LEFT HEART CATH AND CORONARY ANGIOGRAPHY;  Surgeon: [name], MD;  Location: [place] [facility];  Service: Cardiovascular;  Laterality: N/A;  . TONSILLECTOMY  1958   Family History  Problem Relation Age of Onset  . Heart attack Father 80  . Heart disease Father   . Sarcoidosis Mother   . Ovarian cancer Other        Aunt  . Diabetes Other        GP  . Breast cancer Sister 61  . Cancer Sister        breast  . Cancer Paternal Aunt        colon/ovarian   Social History   Tobacco Use  . Smoking status: Former Smoker     Packs/day: 0.50    Years: 10.00    Pack years: 5.00    Types: Cigarettes  . Smokeless tobacco: Former Systems developer    Quit date: 01/24/1986  . Tobacco comment: 1989 quit   Substance Use Topics  . Alcohol use: No   Allergies  Allergen Reactions  . Codeine Other (See Comments)    Reation:  Hallucinations   . Morphine Other (See Comments)    Reaction:  Hallucinations   . Sulfur Rash  . Ace Inhibitors Cough  . Sulfonamide Derivatives Rash      Review of Systems  Constitutional: Positive for appetite change (decreased). Negative for fatigue.  HENT: Negative for congestion, rhinorrhea and sore throat.   Eyes: Negative.   Respiratory: Positive for shortness of breath (minimal). Negative for cough.   Cardiovascular: Negative for chest pain, palpitations and leg swelling.  Gastrointestinal: Positive for constipation. Negative for abdominal distention and abdominal pain.  Endocrine: Negative.   Genitourinary: Negative.   Musculoskeletal: Positive for arthralgias (right shoulder pain). Negative for back pain.  Skin: Negative.   Allergic/Immunologic: Negative.   Neurological: Positive for dizziness (at times). Negative for light-headedness.  Hematological: Negative for adenopathy. Does not bruise/bleed easily.  Psychiatric/Behavioral: Negative for dysphoric mood and sleep disturbance (sleeping on 1 pillow). The patient is not nervous/anxious.      Physical Exam Vitals and nursing note reviewed.  Constitutional:      Appearance: She is well-developed.  HENT:     Head: Normocephalic and atraumatic.  Neck:     Vascular: No JVD.  Cardiovascular:     Rate and Rhythm: Normal rate and regular rhythm.  Pulmonary:     Effort: Pulmonary effort is normal. No respiratory distress.     Breath sounds: No wheezing or rales.  Abdominal:     Palpations: Abdomen is soft.     Tenderness: There is no abdominal tenderness.  Musculoskeletal:     Cervical back: Normal range of motion and neck supple.      Right lower leg: No tenderness. No edema.     Left lower leg: No tenderness. No edema.  Skin:    General: Skin is warm and dry.  Neurological:     General: No focal deficit present.     Mental Status: She is alert and oriented to person, place, and time.  Psychiatric:        Mood and Affect: Mood normal.        Behavior: Behavior normal.     Assessment & Plan:  1: Chronic heart failure with reduced ejection fraction- - NYHA class II - euvolemic today - weighing daily; reminded to call for an overnight weight gain of >2 pounds  or a weekly weight gain of >5 pounds - weight down 11 pounds from last visit here 6 months ago - not adding salt and has been reading food labels for sodium content; reminded to closely follow a 2000mg  sodium diet  - saw cardiology Gilford Rile) 05/14/20 - depending on BP, consider titrating up carvedilol or entresto at future visits - BNP 11/28/19 was 188.0 - reports receiving both covid vaccines  2: HTN- - BP looks good today & she hasn't taken her medications yet today - saw PCP (Copland) 02/18/20 - BMP 05/23/20 reviewed and showed sodium 137, potassium 4.2, creatinine 0.62 and GFR >60   Patient did not bring her medications nor a list. Each medication was verbally reviewed with the patient and she was encouraged to bring the bottles to every visit to confirm accuracy of list.  Return in 6 months or sooner for any questions/problems before then.

## 2020-09-29 ENCOUNTER — Ambulatory Visit: Payer: Medicare Other | Admitting: Family

## 2020-10-03 ENCOUNTER — Other Ambulatory Visit: Payer: Self-pay | Admitting: Family Medicine

## 2020-10-11 ENCOUNTER — Ambulatory Visit: Payer: Medicare Other | Attending: Family | Admitting: Family

## 2020-10-11 ENCOUNTER — Other Ambulatory Visit: Payer: Self-pay

## 2020-10-11 ENCOUNTER — Encounter: Payer: Self-pay | Admitting: Family

## 2020-10-11 VITALS — BP 110/70 | HR 88 | Resp 18 | Ht 64.0 in | Wt 195.4 lb

## 2020-10-11 DIAGNOSIS — Z7982 Long term (current) use of aspirin: Secondary | ICD-10-CM | POA: Insufficient documentation

## 2020-10-11 DIAGNOSIS — I110 Hypertensive heart disease with heart failure: Secondary | ICD-10-CM | POA: Insufficient documentation

## 2020-10-11 DIAGNOSIS — I2510 Atherosclerotic heart disease of native coronary artery without angina pectoris: Secondary | ICD-10-CM | POA: Insufficient documentation

## 2020-10-11 DIAGNOSIS — Z955 Presence of coronary angioplasty implant and graft: Secondary | ICD-10-CM | POA: Insufficient documentation

## 2020-10-11 DIAGNOSIS — I5022 Chronic systolic (congestive) heart failure: Secondary | ICD-10-CM | POA: Insufficient documentation

## 2020-10-11 DIAGNOSIS — E785 Hyperlipidemia, unspecified: Secondary | ICD-10-CM | POA: Diagnosis not present

## 2020-10-11 DIAGNOSIS — Z87891 Personal history of nicotine dependence: Secondary | ICD-10-CM | POA: Diagnosis not present

## 2020-10-11 DIAGNOSIS — I10 Essential (primary) hypertension: Secondary | ICD-10-CM

## 2020-10-11 NOTE — Progress Notes (Signed)
Patient ID: [name] [name], female    DOB: 03-26-1951, 70 y.o.   MRN: [id number]  HPI  [name] [name] is a 70 y/o female with a history of CAD, hyperlipidemia, HTN, breast cancer, previous tobacco use and chronic heart failure.   Echo report from 07/16/20 reviewed and showed an EF of 25-30%. Echo report from 03/01/20 reviewed and showed an EF of 25-30% along with mild MR. Echo report from 11/29/19 reviewed and showed an EF of 25-30%.  LHC done 05/03/20 showed: 1. Severe two-vessel coronary artery disease with diffuse LAD disease of up to 70-80% that is significant by IVUS, as well as CTO of RCA. 2. Upper normal LVEDP. 3. Successful IVUS-guided PCI to mid/distal LAD using overlapping Resolute Onyx 2.5 x 38 mm and 2.25 x 34 mm drug-eluting stents with 0% residual stenosis and TIMI-3 flow.  RHC/LHC on 12/02/19 showed: 4. Significant two-vessel coronary artery disease with diffuse LAD disease of up to 70-80% in the mid/distal vessel and chronic subtotal occlusion of the mid RCA with left-to-right collaterals. 5. Moderately elevated left heart, right heart, and pulmonary artery pressures. 6. Low normal Fick cardiac output/index.  Admitted 07/15/20 due to shortness of breath. CT scan showed large left-sided pleural effusion with thoracentesis done 07/15/20 with removal of 1.4L of fluid. Cardiology consult obtained. Given IV lasix with transition to oral diuretics. Discharged after 2 days. Was in the ED 05/23/20 due to arm and back pain. EKG normal and troponin negative. Treated and released. Admitted 05/03/20 due to outpatient cath/PCI/stent placement with observation overnight.   She presents today for a follow-up visit with a chief complaint of occasional constipation. She describes this as chronic in nature having been present for several years. She has associated decreased appetite along with this. She denies any difficulty sleeping, dizziness, abdominal distention, palpitations, pedal edema, chest pain,  shortness of breath, cough, fatigue or weight gain.   Had been going to cardiac rehab but then stopped because of a chronic cough. She is planning on using her [place] that she has at home and then going to the gym close to her house once the weather improves.   Past Medical History:  Diagnosis Date  . Allergic rhinitis   . Breast cancer [facility]) 2004   Left Breast Cancer  . CHF (congestive heart failure) ([place])   . Coronary artery disease    a. 10/2012 ETT: nl; b. 12/2012 Cath: LM nl, LAD min irregs, D1 min irregs, LCX 30p, RCA 71m, RPDA 40p, EF 50%.  . Diastolic dysfunction    a. 12/2012 Echo: EF 50-55%. No rwma. Gr1 DD. Nl RV fxn. Nl PASP.  [name] Kitchen History of hepatitis B    ?--tested + by [employer]  . HLD (hyperlipidemia)   . HTN (hypertension)   . Osteoarthritis   . Osteopenia    Past Surgical History:  Procedure Laterality Date  . ABDOMINAL HYSTERECTOMY  1985   endometriosis; anatomy  . BREAST SURGERY  10/2002  . CARDIAC CATHETERIZATION  12/19/12   [facility]; EF 50%  . COLONOSCOPY  8/09   Hyperplastic polyp; repeat in 10 years  . CORONARY STENT INTERVENTION N/A 05/03/2020   Procedure: CORONARY STENT INTERVENTION;  Surgeon: [name], MD;  Location: [place] [facility];  Service: Cardiovascular;  Laterality: N/A;  . HIP FRACTURE SURGERY  06/14/2011   ball replaced (Dr. Earvin Hansen)  . INTRAVASCULAR ULTRASOUND/IVUS N/A 05/03/2020   Procedure: Intravascular Ultrasound/IVUS;  Surgeon: [name], MD;  Location: [place] [facility];  Service: Cardiovascular;  Laterality: N/A;  . LEFT HEART CATH N/A 05/03/2020   Procedure: Left Heart Cath;  Surgeon: [name], MD;  Location: [place] [facility];  Service: Cardiovascular;  Laterality: N/A;  . MASTECTOMY Left 2004  . RIGHT/LEFT HEART CATH AND CORONARY ANGIOGRAPHY N/A 12/02/2019   Procedure: RIGHT/LEFT HEART CATH AND CORONARY ANGIOGRAPHY;  Surgeon: [name], MD;  Location: [place] [facility];  Service: Cardiovascular;  Laterality:  N/A;  . TONSILLECTOMY  1958   Family History  Problem Relation Age of Onset  . Heart attack Father 35  . Heart disease Father   . Sarcoidosis Mother   . Ovarian cancer Other        Aunt  . Diabetes Other        GP  . Breast cancer Sister 74  . Cancer Sister        breast  . Cancer Paternal Aunt        colon/ovarian   Social History   Tobacco Use  . Smoking status: Former Smoker    Packs/day: 0.50    Years: 10.00    Pack years: 5.00    Types: Cigarettes  . Smokeless tobacco: Former Systems developer    Quit date: 01/24/1986  . Tobacco comment: 1989 quit   Substance Use Topics  . Alcohol use: No   Allergies  Allergen Reactions  . Codeine Other (See Comments)    Reation:  Hallucinations   . Elemental Sulfur Rash  . Morphine Other (See Comments)    Reaction:  Hallucinations   . Ace Inhibitors Cough  . Sulfonamide Derivatives Rash   Prior to Admission medications   Medication Sig Start Date End Date Taking? Authorizing Provider  aspirin EC 81 MG tablet Take 81 mg by mouth daily.   Yes [provider]  Calcium Carbonate-Vitamin D 600-400 MG-UNIT tablet Take 1 tablet by mouth daily.    Yes [provider]  carvedilol (COREG) 6.25 MG tablet TAKE 1 TABLET (6.25 MG TOTAL) BY MOUTH 2 (TWO) TIMES DAILY WITH A MEAL. 08/24/20  Yes End, Harrell Gave, MD  cetirizine (ZYRTEC) 10 MG tablet Take 10 mg by mouth at bedtime.    Yes [provider]  clopidogrel (PLAVIX) 75 MG tablet Take 1 tablet (75 mg total) by mouth daily. 04/20/20  Yes End, Harrell Gave, MD  dapagliflozin propanediol (FARXIGA) 10 MG TABS tablet Take 1 tablet (10 mg total) by mouth daily. 05/04/20  Yes Duke, Tami Lin, PA  ENTRESTO 49-51 MG TAKE 1 TABLET BY MOUTH TWICE A DAY 06/11/20  Yes End, Harrell Gave, MD  FLUoxetine (PROZAC) 20 MG capsule Take 1 capsule by mouth once daily 10/04/20  Yes Copland, Frederico Hamman, MD  furosemide (LASIX) 40 MG tablet TAKE 1 TABLET BY MOUTH EVERY DAY 06/26/20  Yes Copland,  Frederico Hamman, MD  GRAPE SEED EXTRACT PO Take 1 capsule by mouth 2 (two) times daily.    Yes [provider]  Misc Natural Products (OSTEO BI-FLEX ADV JOINT SHIELD) TABS Take 1 tablet by mouth 2 (two) times daily.   Yes [provider]  Multiple Vitamin (MULTIVITAMIN WITH MINERALS) TABS tablet Take 1 tablet by mouth daily.   Yes [provider]  rosuvastatin (CRESTOR) 10 MG tablet TAKE 1 TABLET BY MOUTH EVERY DAY 09/13/20  Yes End, Harrell Gave, MD  spironolactone (ALDACTONE) 25 MG tablet TAKE 1 TABLET BY MOUTH EVERY DAY 09/15/20  Yes Copland, Frederico Hamman, MD  VITAMIN D PO Take 600 mg by mouth in the morning and at bedtime.   Yes [provider]   Review of Systems  Constitutional: Positive for appetite change (decreased). Negative for fatigue.  HENT: Negative for congestion, rhinorrhea and sore throat.   Eyes: Negative.   Respiratory: Negative for cough and shortness of breath.   Cardiovascular: Negative for chest pain, palpitations and leg swelling.  Gastrointestinal: Positive for constipation. Negative for abdominal distention and abdominal pain.  Endocrine: Negative.   Genitourinary: Negative.   Musculoskeletal: Negative for arthralgias (right shoulder pain improving) and back pain.  Skin: Negative.   Allergic/Immunologic: Negative.   Neurological: Negative for dizziness and light-headedness.  Hematological: Negative for adenopathy. Does not bruise/bleed easily.  Psychiatric/Behavioral: Negative for dysphoric mood and sleep disturbance (sleeping on 1 pillow). The patient is not nervous/anxious.    Vitals:   10/11/20 1120 10/11/20 1137  BP: (!) 137/98 110/70  Pulse: 88   Resp: 18   SpO2: 97%   Weight: 195 lb 6 oz (88.6 kg)   Height: 5\' 4"  (1.626 m)    Wt Readings from Last 3 Encounters:  10/11/20 195 lb 6 oz (88.6 kg)  08/26/20 193 lb (87.5 kg)  07/26/20 188 lb (85.3 kg)   Lab Results  Component Value Date   CREATININE 0.65 08/26/2020   CREATININE 0.56  07/17/2020   CREATININE 0.78 07/16/2020   Physical Exam Vitals and nursing note reviewed.  Constitutional:      Appearance: She is well-developed.  HENT:     Head: Normocephalic and atraumatic.  Neck:     Vascular: No JVD.  Cardiovascular:     Rate and Rhythm: Normal rate and regular rhythm.  Pulmonary:     Effort: Pulmonary effort is normal. No respiratory distress.     Breath sounds: No wheezing or rales.  Abdominal:     Palpations: Abdomen is soft.     Tenderness: There is no abdominal tenderness.  Musculoskeletal:     Cervical back: Normal range of motion and neck supple.     Right lower leg: No tenderness. No edema.     Left lower leg: No tenderness. No edema.  Skin:    General: Skin is warm and dry.  Neurological:     General: No focal deficit present.     Mental Status: She is alert and oriented to person, place, and time.  Psychiatric:        Mood and Affect: Mood normal.        Behavior: Behavior normal.    Assessment & Plan:  1: Chronic heart failure with reduced ejection fraction- - NYHA class I - euvolemic today - weighing daily; reminded to call for an overnight weight gain of >2 pounds or a weekly weight gain of >5 pounds - weight up 2 pounds from last visit here 4 months ago - not adding salt and has been reading food labels for sodium content; reminded to closely follow a 2000mg  sodium diet  - saw cardiology Gilford Rile) 07/26/20 - encouraged her to resume her exercise at home but if she'd like to go back to cardiac rehab to let me know and I can place another referral - on GDMT of carvedilol, farxiga, entresto and spironolactone; could consider titrating entresto and carvedilol at future visits - BNP 11/28/19 was 188.0 - reports receiving both covid vaccines  2: HTN- - BP mildly elevated initially (137/98) and had improved upon recheck with manual cuff (110/70) - saw PCP (Copland) 08/26/20 - BMP 08/26/20 reviewed and showed sodium 138, potassium 4.8,  creatinine 0.65 and GFR 90   Patient did not  bring her medications nor a list. Each medication was verbally reviewed with the patient and she was encouraged to bring the bottles to every visit to confirm accuracy of list.  Return in 5 months or sooner for any questions/problems before then.

## 2020-10-11 NOTE — Patient Instructions (Signed)
Continue weighing daily and call for an overnight weight gain of > 2 pounds or a weekly weight gain of >5 pounds. 

## 2020-10-16 ENCOUNTER — Other Ambulatory Visit: Payer: Self-pay | Admitting: Internal Medicine

## 2020-11-25 ENCOUNTER — Ambulatory Visit: Payer: Medicare Other | Admitting: Family

## 2020-12-03 ENCOUNTER — Ambulatory Visit (INDEPENDENT_AMBULATORY_CARE_PROVIDER_SITE_OTHER): Payer: Medicare Other | Admitting: Internal Medicine

## 2020-12-03 ENCOUNTER — Other Ambulatory Visit: Payer: Self-pay

## 2020-12-03 ENCOUNTER — Encounter: Payer: Self-pay | Admitting: Internal Medicine

## 2020-12-03 VITALS — BP 110/72 | HR 77 | Ht 64.0 in | Wt 199.5 lb

## 2020-12-03 DIAGNOSIS — I2510 Atherosclerotic heart disease of native coronary artery without angina pectoris: Secondary | ICD-10-CM

## 2020-12-03 DIAGNOSIS — I5022 Chronic systolic (congestive) heart failure: Secondary | ICD-10-CM

## 2020-12-03 DIAGNOSIS — I5020 Unspecified systolic (congestive) heart failure: Secondary | ICD-10-CM | POA: Diagnosis not present

## 2020-12-03 DIAGNOSIS — I255 Ischemic cardiomyopathy: Secondary | ICD-10-CM

## 2020-12-03 DIAGNOSIS — I10 Essential (primary) hypertension: Secondary | ICD-10-CM | POA: Diagnosis not present

## 2020-12-03 DIAGNOSIS — E785 Hyperlipidemia, unspecified: Secondary | ICD-10-CM | POA: Diagnosis not present

## 2020-12-03 MED ORDER — CARVEDILOL 12.5 MG PO TABS
12.5000 mg | ORAL_TABLET | Freq: Two times a day (BID) | ORAL | 1 refills | Status: DC
Start: 2020-12-03 — End: 2021-06-03

## 2020-12-03 MED ORDER — ROSUVASTATIN CALCIUM 10 MG PO TABS
10.0000 mg | ORAL_TABLET | Freq: Every day | ORAL | 3 refills | Status: DC
Start: 2020-12-03 — End: 2021-10-05

## 2020-12-03 NOTE — Patient Instructions (Signed)
Medication Instructions:  Your physician has recommended you make the following change in your medication:   1) STOP Aspirin. Continue Plavix as prescribed.  2) INCREASE Carvedilol (coreg) to 12.5 mg twice daily. An Rx has been sent to your pharmacy.  *If you need a refill on your cardiac medications before your next appointment, please call your pharmacy*   Lab Work: BMP today  If you have labs (blood work) drawn today and your tests are completely normal, you will receive your results only by: [name] Kitchen MyChart Message (if you have MyChart) OR . A paper copy in the mail If you have any lab test that is abnormal or we need to change your treatment, we will call you to review the results.   Testing/Procedures: Your physician has requested that you have an echocardiogram. Echocardiography is a painless test that uses sound waves to create images of your heart. It provides your doctor with information about the size and shape of your heart and how well your heart's chambers and valves are working. This procedure takes approximately one hour. There are no restrictions for this procedure. ( To be scheduled in 4 weeks)   Follow-Up: At [facility], you and your health needs are our priority.  As part of our continuing mission to provide you with exceptional heart care, we have created designated Provider Care Teams.  These Care Teams include your primary Cardiologist (physician) and Advanced Practice Providers (APPs -  Physician Assistants and Nurse Practitioners) who all work together to provide you with the care you need, when you need it.  We recommend signing up for the patient portal called "MyChart".  Sign up information is provided on this After Visit Summary.  MyChart is used to connect with patients for Virtual Visits (Telemedicine).  Patients are able to view lab/test results, encounter notes, upcoming appointments, etc.  Non-urgent messages can be sent to your provider as well.   To learn  more about what you can do with MyChart, go to [email or web address].    Your next appointment:   4 week(s) can be scheduled the same day as the echo or shortly after.  The format for your next appointment:   In Person  Provider:   You may see [name], MD or one of the following Advanced Practice Providers on your designated Care Team:    Murray Hodgkins, NP  Christell Faith, PA-C  Marrianne Mood, PA-C  Cadence Melrose, [place]  Laurann Montana, NP    Other Instructions N/A

## 2020-12-03 NOTE — Progress Notes (Signed)
Follow-up Outpatient Visit Date: 12/03/2020  Primary Care Provider: Owens Loffler, MD [place] [address]  Chief Complaint: Follow-up coronary artery disease and heart failure  HPI:  [name] [name] is a 70 y.o. 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  She was last seen in our office by Laurann Montana, NP, in 07/2020 following admission earlier in the month for heart failure with large left pleural effusion.  She felt significantly better following thoracentesis.  She was still feeling well at her follow-up visit a week later.  She did not endorse any significant dyspnea at her heart failure visit with Darylene Price, NP, in late January.  Today, Ms. Forrey reports that she is feeling well, denying chest pain, shortness of breath, palpitations, edema, orthopnea, and PND.  She notes that her weight is up, which she attributes to eating too much.  She is trying to minimize her sodium intake and would like to begin exercising at the gym again.  She has occasional orthostatic lightheadedness but has not fallen or passed out.  She remains compliant with her medications.  --------------------------------------------------------------------------------------------------  Past Medical History:  Diagnosis Date  . Allergic rhinitis   . Breast cancer [facility]) 2004   Left Breast Cancer  . CHF (congestive heart failure) ([place])   . Coronary artery disease    a. 10/2012 ETT: nl; b. 12/2012 Cath: LM nl, LAD min irregs, D1 min irregs, LCX 30p, RCA 48m, RPDA 40p, EF 50%.  . Diastolic dysfunction    a. 12/2012 Echo: EF 50-55%. No rwma. Gr1 DD. Nl RV fxn. Nl PASP.  [name] Kitchen History of hepatitis B    ?--tested + by [employer]  . HLD (hyperlipidemia)   . HTN (hypertension)   .  Osteoarthritis   . Osteopenia    Past Surgical History:  Procedure Laterality Date  . ABDOMINAL HYSTERECTOMY  1985   endometriosis; anatomy  . BREAST SURGERY  10/2002  . CARDIAC CATHETERIZATION  12/19/12   [facility]; EF 50%  . COLONOSCOPY  8/09   Hyperplastic polyp; repeat in 10 years  . CORONARY STENT INTERVENTION N/A 05/03/2020   Procedure: CORONARY STENT INTERVENTION;  Surgeon: [name], MD;  Location: [place] [facility];  Service: Cardiovascular;  Laterality: N/A;  . HIP FRACTURE SURGERY  06/14/2011   ball replaced (Dr. Earvin Hansen)  . INTRAVASCULAR ULTRASOUND/IVUS N/A 05/03/2020   Procedure: Intravascular Ultrasound/IVUS;  Surgeon: [name], MD;  Location: [place] [facility];  Service: Cardiovascular;  Laterality: N/A;  . LEFT HEART CATH N/A 05/03/2020   Procedure: Left Heart Cath;  Surgeon: [name], MD;  Location: [place] [facility];  Service: Cardiovascular;  Laterality: N/A;  . MASTECTOMY Left 2004  . RIGHT/LEFT HEART CATH AND CORONARY ANGIOGRAPHY N/A 12/02/2019   Procedure: RIGHT/LEFT HEART CATH AND CORONARY ANGIOGRAPHY;  Surgeon: [name], MD;  Location: [place] [facility];  Service: Cardiovascular;  Laterality: N/A;  . TONSILLECTOMY  1958    Current Meds  Medication Sig  . aspirin EC 81 MG tablet Take 81 mg by mouth daily.  . Calcium Carbonate-Vitamin D 600-400 MG-UNIT tablet Take 1 tablet by mouth daily.   . carvedilol (COREG) 6.25 MG tablet TAKE 1 TABLET (6.25 MG TOTAL) BY MOUTH 2 (TWO) TIMES DAILY WITH A MEAL.  . cetirizine (ZYRTEC) 10 MG tablet Take  10 mg by mouth at bedtime.   . clopidogrel (PLAVIX) 75 MG tablet TAKE 1 TABLET BY MOUTH EVERY DAY  . dapagliflozin propanediol (FARXIGA) 10 MG TABS tablet Take 1 tablet (10 mg total) by mouth daily.  [name] Kitchen ENTRESTO 49-51 MG TAKE 1 TABLET BY MOUTH TWICE A DAY  . FLUoxetine (PROZAC) 20 MG capsule Take 1 capsule by mouth once daily  . furosemide (LASIX) 40 MG tablet TAKE 1 TABLET BY MOUTH EVERY DAY  . GRAPE  SEED EXTRACT PO Take 1 capsule by mouth 2 (two) times daily.   . Misc Natural Products (OSTEO BI-FLEX ADV JOINT SHIELD) TABS Take 1 tablet by mouth 2 (two) times daily.  . Multiple Vitamin (MULTIVITAMIN WITH MINERALS) TABS tablet Take 1 tablet by mouth daily.  . rosuvastatin (CRESTOR) 10 MG tablet TAKE 1 TABLET BY MOUTH EVERY DAY  . spironolactone (ALDACTONE) 25 MG tablet TAKE 1 TABLET BY MOUTH EVERY DAY  . VITAMIN D PO Take 600 mg by mouth in the morning and at bedtime.    Allergies: Codeine, Elemental sulfur, Morphine, Ace inhibitors, and Sulfonamide derivatives  Social History   Tobacco Use  . Smoking status: Former Smoker    Packs/day: 0.50    Years: 10.00    Pack years: 5.00    Types: Cigarettes  . Smokeless tobacco: Former Systems developer    Quit date: 01/24/1986  . Tobacco comment: 1989 quit   Vaping Use  . Vaping Use: Never used  Substance Use Topics  . Alcohol use: No  . Drug use: No    Family History  Problem Relation Age of Onset  . Heart attack Father 73  . Heart disease Father   . Sarcoidosis Mother   . Ovarian cancer Other        Aunt  . Diabetes Other        GP  . Breast cancer Sister 34  . Cancer Sister        breast  . Cancer Paternal Aunt        colon/ovarian    Review of Systems: A 12-system review of systems was performed and was negative except as noted in the HPI.  --------------------------------------------------------------------------------------------------  Physical Exam: BP 110/72 (BP Location: Left Arm, Patient Position: Sitting, Cuff Size: Large)   Pulse 77   Ht 5\' 4"  (1.626 m)   Wt 199 lb 8 oz (90.5 kg)   SpO2 98%   BMI 34.24 kg/m   General:  NAD. Neck: No JVD or HJR. Lungs: Clear to auscultation bilaterally without wheezes or crackles. Heart: Regular rate and rhythm without murmurs, rubs, or gallops. Abdomen: Soft, nontender, nondistended. Extremities: No lower extremity edema.  EKG: Normal sinus rhythm, left atrial enlargement,  incomplete left bundle branch block, poor R wave progression, and LVH.  Compared with prior tracing from 07/15/2020, heart rate has decreased and PVCs are no longer present.  Lab Results  Component Value Date   WBC 10.1 08/26/2020   HGB 14.8 08/26/2020   HCT 45.5 08/26/2020   MCV 86.5 08/26/2020   PLT 345.0 08/26/2020    Lab Results  Component Value Date   NA 138 08/26/2020   K 4.8 08/26/2020   CL 100 08/26/2020   CO2 32 08/26/2020   BUN 11 08/26/2020   CREATININE 0.65 08/26/2020   GLUCOSE 99 08/26/2020   ALT 17 08/26/2020    Lab Results  Component Value Date   CHOL 187 08/26/2020   HDL 46.60 08/26/2020   LDLCALC 48 05/04/2020   LDLDIRECT  50.0 08/26/2020   TRIG 262.0 (H) 08/26/2020   CHOLHDL 4 08/26/2020    --------------------------------------------------------------------------------------------------  ASSESSMENT AND PLAN: Coronary artery disease: No angina reported.  Ms. Cormier is now more than 6 months out from her PCI to the LAD.  We have agreed to discontinue aspirin and continue single antiplatelet therapy with clopidogrel 75 mg daily.  Secondary prevention with rosuvastatin and carvedilol will also be continued.  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 Doswell appears euvolemic with NYHA class II symptoms.  She is trying to walk more and would like to begin exercising at the gym, which I think is great.  We will increase carvedilol to 12.5 mg twice daily and continue current doses of furosemide, Farxiga, spironolactone, and Entresto.  Continued sodium restriction was encouraged.  We will plan to repeat a limited echo in about 4 months to reassess LVEF.  If it remains below 35%, referral to EP for consideration of ICD placement will need to be pursued.  Hyperlipidemia: LDL well controlled on most recent check in 08/2020.  Triglycerides were mildly elevated albeit in a nonfasting state.  We will continue current dose of rosuvastatin and work on lifestyle  modifications.  Hypertension: Blood pressure well controlled but not low.  We will use this opportunity to escalate carvedilol, as outlined above, to optimize evidence-based heart failure therapy.  Follow-up: Return to clinic in 1 month with limited echo at or shortly before that visit.  [name], MD 12/03/2020 2:03 PM

## 2020-12-04 ENCOUNTER — Encounter: Payer: Self-pay | Admitting: Internal Medicine

## 2020-12-04 ENCOUNTER — Other Ambulatory Visit: Payer: Self-pay | Admitting: Internal Medicine

## 2020-12-04 DIAGNOSIS — E1169 Type 2 diabetes mellitus with other specified complication: Secondary | ICD-10-CM

## 2020-12-04 DIAGNOSIS — E785 Hyperlipidemia, unspecified: Secondary | ICD-10-CM

## 2020-12-04 HISTORY — DX: Hyperlipidemia, unspecified: E78.5

## 2020-12-04 HISTORY — DX: Type 2 diabetes mellitus with other specified complication: E11.69

## 2020-12-04 LAB — BASIC METABOLIC PANEL
BUN/Creatinine Ratio: 22 (ref 12–28)
BUN: 15 mg/dL (ref 8–27)
CO2: 24 mmol/L (ref 20–29)
Calcium: 10.6 mg/dL — ABNORMAL HIGH (ref 8.7–10.3)
Chloride: 98 mmol/L (ref 96–106)
Creatinine, Ser: 0.68 mg/dL (ref 0.57–1.00)
Glucose: 98 mg/dL (ref 65–99)
Potassium: 4.7 mmol/L (ref 3.5–5.2)
Sodium: 139 mmol/L (ref 134–144)
eGFR: 94 mL/min/{1.73_m2} (ref >59)

## 2020-12-06 NOTE — Telephone Encounter (Signed)
Rx request sent to pharmacy.  

## 2020-12-22 ENCOUNTER — Other Ambulatory Visit: Payer: Self-pay | Admitting: Family Medicine

## 2020-12-31 ENCOUNTER — Other Ambulatory Visit: Payer: Medicare Other

## 2020-12-31 ENCOUNTER — Ambulatory Visit: Payer: Medicare Other | Admitting: Family

## 2021-01-16 ENCOUNTER — Other Ambulatory Visit: Payer: Self-pay | Admitting: Internal Medicine

## 2021-02-01 ENCOUNTER — Ambulatory Visit (INDEPENDENT_AMBULATORY_CARE_PROVIDER_SITE_OTHER): Payer: Medicare Other

## 2021-02-01 ENCOUNTER — Other Ambulatory Visit: Payer: Self-pay

## 2021-02-01 DIAGNOSIS — I5022 Chronic systolic (congestive) heart failure: Secondary | ICD-10-CM | POA: Diagnosis not present

## 2021-02-01 MED ORDER — PERFLUTREN LIPID MICROSPHERE: 1.0000 mL | INTRAVENOUS | Status: AC | PRN

## 2021-02-02 LAB — ECHOCARDIOGRAM LIMITED
Area-P 1/2: 3.37 cm2
S' Lateral: 5.9 cm
Single Plane A4C EF: 35.5 %

## 2021-02-03 ENCOUNTER — Telehealth: Payer: Self-pay

## 2021-02-03 NOTE — Telephone Encounter (Signed)
Able to reach pt regarding her recent echo, Dr. Saunders Revel had a chance to review her results and advised   "Please let [name] [name] know that her echocardiogram shows that her heart remains weakened, similar to prior studies, with an ejection fraction of 30-35%. She should follow-up as scheduled next month to reassess her symptoms and discuss potential referral to electrophysiology for consideration of ICD."   Mrs. Riolo is thankful for the phone call, reports at last visit her and provider spoke of possible EP referral, she advised will keep her upcoming appt in June and will go from there regarding EP. Nothing further at this time.

## 2021-02-04 ENCOUNTER — Ambulatory Visit: Payer: Medicare Other | Admitting: Physician Assistant

## 2021-02-06 ENCOUNTER — Other Ambulatory Visit: Payer: Self-pay | Admitting: Internal Medicine

## 2021-02-07 ENCOUNTER — Other Ambulatory Visit: Payer: Self-pay | Admitting: Internal Medicine

## 2021-02-15 NOTE — Progress Notes (Signed)
Cardiology Office Note    Date:  02/16/2021   ID:  [name] [name], DOB 24-Jun-1951, MRN [id number]  PCP:  Owens Loffler, MD  Cardiologist:  [name], MD  Electrophysiologist:  None   Chief Complaint: Follow-up  History of Present Illness:   [name] [name] is a 70 y.o. 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  Prior LHC in 12/2012 showed nonobstructive disease as outlined below.  Echo at that time showed an EF of 50 to 55%, no regional wall motion abnormalities, grade 1 diastolic dysfunction, normal RV systolic function and PASP.  She was admitted to the hospital in 11/2019 with exertional dyspnea.  High-sensitivity troponin peaked at 415.  Echo demonstrated a new cardiomyopathy with an EF of 25 to 30%, global hypokinesis, mildly dilated LV internal cavity size, mild LVH, grade 1 diastolic dysfunction, normal RV systolic function and ventricular cavity size, mildly dilated left atrium, and an estimated right atrial pressure of 3 mmHg.  R/LHC at that time showed significant two-vessel CAD with diffuse LAD stenosis up to 70 to 80% in the mid/distal vessel and CTO of the mid RCA with left-to-right collaterals.  Moderately elevated left heart, right heart, and pulmonary artery pressures.  Low normal cardiac output/index.  Medical management was advised given small vessel size and diffuse disease involving the LAD and RCA making PCI and surgical revascularization suboptimal.  Repeat echo on GDMT in 02/2020 showed a persistent cardiomyopathy with an EF of 25 to 30%, global hypokinesis, mildly dilated LV internal cavity size, grade 2 diastolic dysfunction, normal RV systolic function and ventricular cavity size, mildly dilated left atrium, mild mitral regurgitation, and mild to moderate aortic valve sclerosis without evidence  of stenosis.  She subsequently underwent repeat LHC in 04/2020 which showed severe two-vessel CAD with diffuse LAD stenosis of up to 70 to 80% that was significant by IVUS, as well as CTO of the RCA.  She underwent successful IVUS guided PCI to the mid/distal LAD using overlapping DES.    She was admitted to the hospital in 07/2020 with acute on chronic combined systolic and diastolic CHF and a large left pleural effusion.  She underwent thoracentesis with 1.4 L of fluid removed with cytology being negative for malignancy.  Echo in 07/2020, again showed a persistent cardiomyopathy with an EF of 25 to 30%, global hypokinesis with severe hypokinesis of the anteroseptal wall and basal anterior wall, mildly dilated LV internal cavity size, normal RV systolic function and ventricular cavity size.  She was last seen in the office in 11/2020 and continued to feel well.  Her weight was up 11 pounds by our scale, which she attributed to caloric intake.  She did note occasional orthostatic lightheadedness.  Carvedilol was titrated to 12.5 mg twice daily.  Otherwise, she was continued on Entresto, Farxiga, and furosemide.  She underwent repeat echo on 02/01/2021, on maximally tolerated GDMT, which showed a slight improvement in her LV SF with an EF of 30 to 35%, global hypokinesis, moderately to severely dilated LV internal cavity size, grade 1 diastolic dysfunction, trivial pericardial effusion, and akinesis of the basal mid inferior wall.  She comes in today feeling about the same as she did last visit with continued exertional fatigue and shortness of breath.  Her weight is up 6 pounds when compared to her last clinic visit though she  feels like this is caloric intake and in the setting of sedentary lifestyle.  She does not feel like she is volume overloaded.  No chest pain, palpitations, presyncope, or syncope.  No lower extremity swelling, abdominal distention, PND, or early satiety.  She is watching her salt intake  and drinking approximately 2 L of fluid per day.  No falls, hematochezia, or melena.  She does report experiencing a split second dizziness when she rolls over at nighttime while sleeping.  She does not feel like the room is spinning around her.  She did note some mild lightheadedness with positional change in the office today with orthostatics positive as outlined below.   Labs independently reviewed: 11/2020 - BUN 15, serum creatinine 0.68, potassium 4.7 08/2020 - direct LDL 50, TC 187, TG 262, HDL 46, TSH normal, albumin 4.5, AST/ALT normal, Hgb 14.8, PLT 345 07/2020 - magnesium 2.4  Past Medical History:  Diagnosis Date  . Allergic rhinitis   . Breast cancer [facility]) 2004   Left Breast Cancer  . CHF (congestive heart failure) ([place])   . Coronary artery disease    a. 10/2012 ETT: nl; b. 12/2012 Cath: LM nl, LAD min irregs, D1 min irregs, LCX 30p, RCA 81m, RPDA 40p, EF 50%.  . Diastolic dysfunction    a. 12/2012 Echo: EF 50-55%. No rwma. Gr1 DD. Nl RV fxn. Nl PASP.  [name] Kitchen History of hepatitis B    ?--tested + by [employer]  . HLD (hyperlipidemia)   . HTN (hypertension)   . Hyperlipidemia associated with type 2 diabetes mellitus ([place]) 12/04/2020  . Osteoarthritis   . Osteopenia     Past Surgical History:  Procedure Laterality Date  . ABDOMINAL HYSTERECTOMY  1985   endometriosis; anatomy  . BREAST SURGERY  10/2002  . CARDIAC CATHETERIZATION  12/19/12   [facility]; EF 50%  . COLONOSCOPY  8/09   Hyperplastic polyp; repeat in 10 years  . CORONARY STENT INTERVENTION N/A 05/03/2020   Procedure: CORONARY STENT INTERVENTION;  Surgeon: [name], MD;  Location: [place] [facility];  Service: Cardiovascular;  Laterality: N/A;  . HIP FRACTURE SURGERY  06/14/2011   ball replaced (Dr. Earvin Hansen)  . INTRAVASCULAR ULTRASOUND/IVUS N/A 05/03/2020   Procedure: Intravascular Ultrasound/IVUS;  Surgeon: [name], MD;  Location: [place] [facility];  Service: Cardiovascular;  Laterality: N/A;  . LEFT HEART  CATH N/A 05/03/2020   Procedure: Left Heart Cath;  Surgeon: [name], MD;  Location: [place] [facility];  Service: Cardiovascular;  Laterality: N/A;  . MASTECTOMY Left 2004  . RIGHT/LEFT HEART CATH AND CORONARY ANGIOGRAPHY N/A 12/02/2019   Procedure: RIGHT/LEFT HEART CATH AND CORONARY ANGIOGRAPHY;  Surgeon: [name], MD;  Location: [place] [facility];  Service: Cardiovascular;  Laterality: N/A;  . TONSILLECTOMY  1958    Current Medications: Current Meds  Medication Sig  . Calcium Carbonate-Vitamin D 600-400 MG-UNIT tablet Take 1 tablet by mouth daily.   . carvedilol (COREG) 12.5 MG tablet Take 1 tablet (12.5 mg total) by mouth 2 (two) times daily with a meal.  . cetirizine (ZYRTEC) 10 MG tablet Take 10 mg by mouth at bedtime.   . clopidogrel (PLAVIX) 75 MG tablet TAKE 1 TABLET BY MOUTH EVERY DAY  . dapagliflozin propanediol (FARXIGA) 10 MG TABS tablet Take 1 tablet (10 mg total) by mouth daily.  [name] Kitchen ENTRESTO 49-51 MG TAKE 1 TABLET BY MOUTH TWICE A DAY  . FLUoxetine (PROZAC) 20 MG capsule Take 1 capsule by mouth once daily  .  GRAPE SEED EXTRACT PO Take 1 capsule by mouth 2 (two) times daily.   . Misc Natural Products (OSTEO BI-FLEX ADV JOINT SHIELD) TABS Take 1 tablet by mouth 2 (two) times daily.  . Multiple Vitamin (MULTIVITAMIN WITH MINERALS) TABS tablet Take 1 tablet by mouth daily.  . rosuvastatin (CRESTOR) 10 MG tablet Take 1 tablet (10 mg total) by mouth daily.  [name] Kitchen spironolactone (ALDACTONE) 25 MG tablet TAKE 1 TABLET BY MOUTH EVERY DAY  . VITAMIN D PO Take 600 mg by mouth in the morning and at bedtime.  . [DISCONTINUED] furosemide (LASIX) 40 MG tablet TAKE 1 TABLET BY MOUTH EVERY DAY    Allergies:   Codeine, Elemental sulfur, Morphine, Ace inhibitors, and Sulfonamide derivatives   Social History   Socioeconomic History  . Marital status: Widowed    Spouse name: (Widow) to [place]  . Number of children: Not on file  . Years of education: Not on file  . Highest  education level: Not on file  Occupational History  . Not on file  Tobacco Use  . Smoking status: Former Smoker    Packs/day: 0.50    Years: 10.00    Pack years: 5.00    Types: Cigarettes  . Smokeless tobacco: Former Systems developer    Quit date: 01/24/1986  . Tobacco comment: 1989 quit   Vaping Use  . Vaping Use: Never used  Substance and Sexual Activity  . Alcohol use: No  . Drug use: No  . Sexual activity: Not on file  Other Topics Concern  . Not on file  Social History Narrative   Widowed to husband Renato Gails      No regular exercise   Lives locally w/ tenants that help her w/ chores around the house.   Social Determinants of Health   Financial Resource Strain: Not on file  Food Insecurity: Not on file  Transportation Needs: Not on file  Physical Activity: Not on file  Stress: Not on file  Social Connections: Not on file     Family History:  The patient's family history includes Breast cancer (age of onset: 40) in her sister; Cancer in her paternal aunt and sister; Diabetes in an other family member; Heart attack (age of onset: 61) in her father; Heart disease in her father; Ovarian cancer in an other family member; Sarcoidosis in her mother.  ROS:   Review of Systems  Constitutional: Positive for malaise/fatigue. Negative for chills, diaphoresis, fever and weight loss.  HENT: Negative for congestion.   Eyes: Negative for discharge and redness.  Respiratory: Positive for shortness of breath. Negative for cough, sputum production and wheezing.   Cardiovascular: Negative for chest pain, palpitations, orthopnea, claudication, leg swelling and PND.  Gastrointestinal: Negative for abdominal pain, blood in stool, heartburn, melena, nausea and vomiting.  Musculoskeletal: Negative for falls and myalgias.  Skin: Negative for rash.  Neurological: Positive for dizziness and weakness. Negative for tingling, tremors, sensory change, speech change, focal weakness and loss of  consciousness.       Mild dizziness noted for a split-second when rotating during sleep along with a mild lightheadedness associated with positional changes during orthostatics today in the office  Endo/Heme/Allergies: Does not bruise/bleed easily.  Psychiatric/Behavioral: Negative for substance abuse. The patient is not nervous/anxious.   All other systems reviewed and are negative.    EKGs/Labs/Other Studies Reviewed:    Studies reviewed were summarized above. The additional studies were reviewed today:  2D echo 02/01/2021: 1. Left ventricular ejection  fraction, by estimation, is 30 to 35%. The  left ventricle has moderately decreased function. The left ventricle  demonstrates global hypokinesis. The left ventricular internal cavity size  was moderately to severely dilated.  Left ventricular diastolic parameters are consistent with Grade I  diastolic dysfunction (impaired relaxation). There is akinesis of the left  ventricular, basal-mid inferior wall.  __________  2D echo 07/16/2020: 1. Left ventricular ejection fraction, by estimation, is 25 to 30%. The  left ventricle has severely decreased function. The left ventricle  demonstrates global hypokinesis with severe hypokinesis of the  anterospetal wall and basal anterior wall. The left  ventricular internal cavity size was mildly dilated.  2. Right ventricular systolic function is normal. The right ventricular  size is normal. Tricuspid regurgitation signal is inadequate for assessing  PA pressure.  __________  LHC 05/03/2020: Conclusions: 1. Severe two-vessel coronary artery disease with diffuse LAD disease of up to 70-80% that is significant by IVUS, as well as CTO of RCA. 2. Upper normal LVEDP. 3. Successful IVUS-guided PCI to mid/distal LAD using overlapping Resolute Onyx 2.5 x 38 mm and 2.25 x 34 mm drug-eluting stents with 0% residual stenosis and TIMI-3 flow.  Recommendations: 1. Overnight extended recovery given  multivessel CAD and LVEF < 35%. 2. Obtain CXR and UA/urine culture for further evaluation of leukocytosis. 3. Dual antiplatelet therapy with ASA and clopidogrel for at lest 6 months, ideally longer. 4. Recheck lipid panel in AM and escalate statin therapy, as tolerated, if LDL > 70. __________  Cardiac MRI 04/17/2020: 1. Severe left ventricular enlargement with severely reduced LV function, LVEF 33%. Akinesis of inferior and inferolateral walls. 2. Post-contrast delayed myocardial enhancement in the inferior and inferolateral walls, with greater than 50% of the myocardial thickness involved suggesting no viability in the RCA territory. 3.  Normal right ventricular size and systolic function. __________  2D echo 03/01/2020: 1. Left ventricular ejection fraction, by estimation, is 25 to 30%. The  left ventricle has severely decreased function. The left ventricle  demonstrates global hypokinesis. The left ventricular internal cavity size  was mildly dilated. Left ventricular  diastolic parameters are consistent with Grade II diastolic dysfunction  (pseudonormalization).  2. Right ventricular systolic function is normal. The right ventricular  size is normal. Tricuspid regurgitation signal is inadequate for assessing  PA pressure.  3. Left atrial size was mildly dilated.  4. The mitral valve is normal in structure. Mild mitral valve  regurgitation. No evidence of mitral stenosis.  5. The aortic valve is normal in structure. Aortic valve regurgitation is  not visualized. Mild to moderate aortic valve sclerosis/calcification is  present, without any evidence of aortic stenosis.   Comparison(s): Previous Echo showed new finding of LV EF 25-30% with  severely decreased function, global HK, and grade I diastolic dysfunction.  __________  [facility] 12/02/2019: Conclusions: 4. Significant two-vessel coronary artery disease with diffuse LAD disease of up to 70-80% in the mid/distal vessel and  chronic subtotal occlusion of the mid RCA with left-to-right collaterals. 5. Moderately elevated left heart, right heart, and pulmonary artery pressures. 6. Low normal Fick cardiac output/index.  Recommendations: 5. Escalate diuresis and optimize evidence-based heart failure therapy. 6. Medical management of coronary artery disease. Small vessel size and diffuse disease involving LAD and RCA make percutaneous and surgical revascularization suboptimal. __________  2D echo 11/29/2019: 1. Left ventricular ejection fraction, by estimation, is 25 to 30%. The  left ventricle has severely decreased function. The left ventricle  demonstrates global hypokinesis. The left  ventricular internal cavity size  was mildly dilated. There is mild left  ventricular hypertrophy. Left ventricular diastolic parameters are  consistent with Grade I diastolic dysfunction (impaired relaxation).  2. Right ventricular systolic function is normal. The right ventricular  size is normal. Tricuspid regurgitation signal is inadequate for assessing  PA pressure.  3. Left atrial size was mildly dilated.  4. The inferior vena cava is normal in size with greater than 50%  respiratory variability, suggesting right atrial pressure of 3 mmHg. __________  [facility] 12/19/2012: Left main normal, LAD minor luminal irregularities, proximal LCx 30% stenosis, mid RCA 50% stenosis, proximal RPDA 40% stenosis, LVEF estimated 50%.  Medical therapy recommended. __________  2D echo 12/10/2012: - Left ventricle: The cavity size was normal. Systolic  function was normal. The estimated ejection fraction was  in the range of 50% to 55%. Wall motion was normal; there  were no regional wall motion abnormalities. Doppler  parameters are consistent with abnormal left ventricular  relaxation (grade 1 diastolic dysfunction).  - Left atrium: The atrium was normal in size.  - Right ventricle: Systolic function was normal.  - Pulmonary  arteries: Systolic pressure was within the  normal range.    EKG:  EKG is ordered today.  The EKG ordered today demonstrates NSR, 78 bpm, LVH, inferior Q waves, and nonspecific ST-T changes  Recent Labs: 07/17/2020: Magnesium 2.4 08/26/2020: ALT 17; Hemoglobin 14.8; Platelets 345.0; TSH 1.79 02/16/2021: BUN 16; Creatinine, Ser 0.74; Potassium 4.0; Sodium 137  Recent Lipid Panel    Component Value Date/Time   CHOL 187 08/26/2020 1538   TRIG 262.0 (H) 08/26/2020 1538   HDL 46.60 08/26/2020 1538   CHOLHDL 4 08/26/2020 1538   VLDL 52.4 (H) 08/26/2020 1538   LDLCALC 48 05/04/2020 0812   LDLDIRECT 50.0 08/26/2020 1538    PHYSICAL EXAM:    VS:  BP 120/80 (BP Location: Left Arm, Patient Position: Sitting, Cuff Size: Large)   Pulse 78   Ht 5\' 4"  (1.626 m)   Wt 205 lb 8 oz (93.2 kg)   SpO2 96%   BMI 35.27 kg/m   BMI: Body mass index is 35.27 kg/m.  Physical Exam Vitals reviewed.  Constitutional:      Appearance: She is well-developed.  HENT:     Head: Normocephalic and atraumatic.  Eyes:     General:        Right eye: No discharge.        Left eye: No discharge.  Neck:     Vascular: No JVD.  Cardiovascular:     Rate and Rhythm: Normal rate and regular rhythm.     Pulses: No midsystolic click and no opening snap.          Posterior tibial pulses are 2+ on the right side and 2+ on the left side.     Heart sounds: Normal heart sounds, S1 normal and S2 normal. Heart sounds not distant. No murmur heard. No friction rub.  Pulmonary:     Effort: Pulmonary effort is normal. No respiratory distress.     Breath sounds: Normal breath sounds. No decreased breath sounds, wheezing or rales.  Chest:     Chest wall: No tenderness.  Abdominal:     General: There is no distension.     Palpations: Abdomen is soft.     Tenderness: There is no abdominal tenderness.  Musculoskeletal:     Cervical back: Normal range of motion.  Skin:    General: Skin is warm and dry.  Nails: There is  no clubbing.  Neurological:     Mental Status: She is alert and oriented to person, place, and time.  Psychiatric:        Speech: Speech normal.        Behavior: Behavior normal.        Thought Content: Thought content normal.        Judgment: Judgment normal.     Wt Readings from Last 3 Encounters:  02/16/21 205 lb 8 oz (93.2 kg)  12/03/20 199 lb 8 oz (90.5 kg)  10/11/20 195 lb 6 oz (88.6 kg)    Orthostatic vitals: Lying: 140/82, 80 bpm Sitting: 116/76, 84 bpm, slight lightheadedness Standing: 97/62, 88 bpm, mild lightheadedness Standing x3 minutes: 121/83, 91 bpm   ASSESSMENT & PLAN:   1. CAD involving the native coronaries without angina: She is doing well without any symptoms concerning for angina.  Continue risk factor modification and secondary prevention with medical therapy including clopidogrel, carvedilol, and rosuvastatin.  No indication for further ischemic testing at this time.  2. Chronic combined systolic and diastolic CHF secondary to ICM: She appears euvolemic and well compensated with NYHA class II-III symptoms.  Her weight is up 6 pounds when compared to her last clinic visit though she does not appear to be fluid up and this is likely in the setting of caloric intake with a sedentary lifestyle.  Despite percutaneous revascularization and optimization of maximally tolerated GDMT, her cardiomyopathy persists with an unchanged EF of 30 to 35% by most recent echo in 01/2021.  In this setting she will be referred to EP for consideration of ICD.  With noted orthostasis in the office today we will decrease her furosemide to 20 mg daily.  Precautions were discussed for fluid overload.  She will otherwise continue carvedilol, Farxiga, Entresto, and spironolactone at current doses.  We will check a BMP.  CHF education.  3. HTN/orthostatic hypotension: Blood pressure is well controlled at triage with noted orthostatic hypotension as outlined above.  Medication changes as noted  above.  4. HLD: LDL 50 in 08/2020.  He remains on rosuvastatin.  Disposition: F/u with Dr. Saunders Revel or an APP in 3 months.   Medication Adjustments/Labs and Tests Ordered: Current medicines are reviewed at length with the patient today.  Concerns regarding medicines are outlined above. Medication changes, Labs and Tests ordered today are summarized above and listed in the Patient Instructions accessible in Encounters.   Signed, Christell Faith, PA-C 02/16/2021 5:05 PM     [facility] [address] [place] [address] [place],  [address] [phone]

## 2021-02-16 ENCOUNTER — Other Ambulatory Visit: Payer: Self-pay

## 2021-02-16 ENCOUNTER — Other Ambulatory Visit
Admission: RE | Admit: 2021-02-16 | Discharge: 2021-02-16 | Disposition: A | Discharge status: Home / Self Care | Payer: Medicare Other | Source: Ambulatory Visit | Attending: Physician Assistant | Admitting: Physician Assistant

## 2021-02-16 ENCOUNTER — Encounter: Payer: Self-pay | Admitting: Physician Assistant

## 2021-02-16 ENCOUNTER — Telehealth: Payer: Self-pay | Admitting: *Deleted

## 2021-02-16 ENCOUNTER — Ambulatory Visit (INDEPENDENT_AMBULATORY_CARE_PROVIDER_SITE_OTHER): Payer: Medicare Other | Admitting: Physician Assistant

## 2021-02-16 VITALS — BP 120/80 | HR 78 | Ht 64.0 in | Wt 205.5 lb

## 2021-02-16 DIAGNOSIS — I951 Orthostatic hypotension: Secondary | ICD-10-CM

## 2021-02-16 DIAGNOSIS — Z79899 Other long term (current) drug therapy: Secondary | ICD-10-CM | POA: Insufficient documentation

## 2021-02-16 DIAGNOSIS — I10 Essential (primary) hypertension: Secondary | ICD-10-CM | POA: Diagnosis not present

## 2021-02-16 DIAGNOSIS — I2510 Atherosclerotic heart disease of native coronary artery without angina pectoris: Secondary | ICD-10-CM | POA: Diagnosis not present

## 2021-02-16 DIAGNOSIS — E785 Hyperlipidemia, unspecified: Secondary | ICD-10-CM | POA: Diagnosis not present

## 2021-02-16 DIAGNOSIS — I255 Ischemic cardiomyopathy: Secondary | ICD-10-CM

## 2021-02-16 DIAGNOSIS — I5042 Chronic combined systolic (congestive) and diastolic (congestive) heart failure: Secondary | ICD-10-CM

## 2021-02-16 LAB — BASIC METABOLIC PANEL
Anion gap: 11 (ref 5–15)
BUN: 16 mg/dL (ref 8–23)
CO2: 26 mmol/L (ref 22–32)
Calcium: 9.5 mg/dL (ref 8.9–10.3)
Chloride: 100 mmol/L (ref 98–111)
Creatinine, Ser: 0.74 mg/dL (ref 0.44–1.00)
GFR, Estimated: >60 mL/min (ref >60)
Glucose, Bld: 122 mg/dL — ABNORMAL HIGH (ref 70–99)
Potassium: 4 mmol/L (ref 3.5–5.1)
Sodium: 137 mmol/L (ref 135–145)

## 2021-02-16 MED ORDER — FUROSEMIDE 40 MG PO TABS: 20.0000 mg | ORAL_TABLET | Freq: Every day | ORAL | 1 refills | Status: DC

## 2021-02-16 NOTE — Telephone Encounter (Signed)
Left voicemail message to call back for review of results and recommendations.  

## 2021-02-16 NOTE — Telephone Encounter (Signed)
-----   Message from Rise Mu, [place] sent at 02/16/2021  4:55 PM EDT ----- Potassium at goal Renal function normal Random glucose okay  Continue with planned decrease in Lasix as discussed at our office visit given drops in BP

## 2021-02-16 NOTE — Patient Instructions (Signed)
Medication Instructions:   Decrease your Lasix to 20 mg Daily  *If you need a refill on your cardiac medications before your next appointment, please call your pharmacy*   Lab Work: BMET  LABS WILL APPEAR ON [place]  Testing/Procedures: None   Follow-Up: Your next appointment:   3 month(s)  The format for your next appointment:   In Person  Provider:   Christell Faith, PA-C   Other Instructions Referral to EP clinic has been placed, we will get you schedule today

## 2021-02-18 NOTE — Telephone Encounter (Signed)
Spoke with patient and reviewed results and recommendations but when I went to ask if she had questions the phone was silent. No answer when I tried to see if anyone was on the line. Called back and left message that I was not able to hear her and results are on my chart and if she has any questions to call back.

## 2021-02-22 NOTE — Addendum Note (Signed)
Addended by: Anselm Pancoast on: 02/22/2021 04:29 PM   Modules accepted: Orders

## 2021-03-08 ENCOUNTER — Encounter: Payer: Self-pay | Admitting: Family

## 2021-03-08 ENCOUNTER — Ambulatory Visit: Payer: Medicare Other | Attending: Family | Admitting: Family

## 2021-03-08 ENCOUNTER — Other Ambulatory Visit: Payer: Self-pay

## 2021-03-08 VITALS — BP 132/77 | HR 73 | Temp 98.4°F | Resp 16 | Ht 64.0 in | Wt 208.2 lb

## 2021-03-08 DIAGNOSIS — Z79899 Other long term (current) drug therapy: Secondary | ICD-10-CM | POA: Insufficient documentation

## 2021-03-08 DIAGNOSIS — I110 Hypertensive heart disease with heart failure: Secondary | ICD-10-CM | POA: Insufficient documentation

## 2021-03-08 DIAGNOSIS — I509 Heart failure, unspecified: Secondary | ICD-10-CM | POA: Insufficient documentation

## 2021-03-08 DIAGNOSIS — Z87891 Personal history of nicotine dependence: Secondary | ICD-10-CM | POA: Diagnosis not present

## 2021-03-08 DIAGNOSIS — Z7902 Long term (current) use of antithrombotics/antiplatelets: Secondary | ICD-10-CM | POA: Diagnosis not present

## 2021-03-08 DIAGNOSIS — I2510 Atherosclerotic heart disease of native coronary artery without angina pectoris: Secondary | ICD-10-CM | POA: Insufficient documentation

## 2021-03-08 DIAGNOSIS — I5022 Chronic systolic (congestive) heart failure: Secondary | ICD-10-CM

## 2021-03-08 DIAGNOSIS — I10 Essential (primary) hypertension: Secondary | ICD-10-CM

## 2021-03-08 DIAGNOSIS — E785 Hyperlipidemia, unspecified: Secondary | ICD-10-CM | POA: Diagnosis not present

## 2021-03-08 DIAGNOSIS — Z8249 Family history of ischemic heart disease and other diseases of the circulatory system: Secondary | ICD-10-CM | POA: Insufficient documentation

## 2021-03-08 DIAGNOSIS — Z955 Presence of coronary angioplasty implant and graft: Secondary | ICD-10-CM | POA: Diagnosis not present

## 2021-03-08 NOTE — Progress Notes (Signed)
Patient ID: [name] [name], female    DOB: Jan 06, 1951, 70 y.o.   MRN: [id number]  HPI  Ms Kontz is a 70 y/o female with a history of CAD, hyperlipidemia, HTN, breast cancer, previous tobacco use and chronic heart failure.   Echo report from 02/01/21 reviewed and showed an EF of 30-35%. Echo report from 07/16/20 reviewed and showed an EF of 25-30%. Echo report from 03/01/20 reviewed and showed an EF of 25-30% along with mild MR. Echo report from 11/29/19 reviewed and showed an EF of 25-30%.  LHC done 05/03/20 showed: Severe two-vessel coronary artery disease with diffuse LAD disease of up to 70-80% that is significant by IVUS, as well as CTO of RCA. Upper normal LVEDP. Successful IVUS-guided PCI to mid/distal LAD using overlapping Resolute Onyx 2.5 x 38 mm and 2.25 x 34 mm drug-eluting stents with 0% residual stenosis and TIMI-3 flow.   RHC/LHC on 12/02/19 showed: Significant two-vessel coronary artery disease with diffuse LAD disease of up to 70-80% in the mid/distal vessel and chronic subtotal occlusion of the mid RCA with left-to-right collaterals. Moderately elevated left heart, right heart, and pulmonary artery pressures. Low normal Fick cardiac output/index.  Has not been admitted or been in the ED in the last 6 months.   She presents today for a follow-up visit with a chief complaint of minimal shortness of breath upon moderate exertion (such as in cleaning the house). She describes this as chronic in nature having been present for several years. She has associated right shoulder soreness and gradual weight gain along with this. She denies any difficulty sleeping, abdominal distention, palpitations, pedal edema, chest pain, cough, dizziness or fatigue.   She says that she's noticed a gradual weight gain because she's eating "too much" although does try and watch her sodium intake. Says that she only adds salt to her tomato sandwiches.   Has upcoming EP appointment to discuss AICD.    Past Medical History:  Diagnosis Date   Allergic rhinitis    Breast cancer ([place]) 2004   Left Breast Cancer   CHF (congestive heart failure) ([place])    Coronary artery disease    a. 10/2012 ETT: nl; b. 12/2012 Cath: LM nl, LAD min irregs, D1 min irregs, LCX 30p, RCA 53m, RPDA 40p, EF 50%.   Diastolic dysfunction    a. 12/2012 Echo: EF 50-55%. No rwma. Gr1 DD. Nl RV fxn. Nl PASP.   History of hepatitis B    ?--tested + by [facility]   HLD (hyperlipidemia)    HTN (hypertension)    Hyperlipidemia associated with type 2 diabetes mellitus ([place]) 12/04/2020   Osteoarthritis    Osteopenia    Past Surgical History:  Procedure Laterality Date   ABDOMINAL HYSTERECTOMY  1985   endometriosis; anatomy   BREAST SURGERY  10/2002   CARDIAC CATHETERIZATION  12/19/12   [place]; EF 50%   COLONOSCOPY  8/09   Hyperplastic polyp; repeat in 10 years   CORONARY STENT INTERVENTION N/A 05/03/2020   Procedure: CORONARY STENT INTERVENTION;  Surgeon: [name], MD;  Location: [place] [facility];  Service: Cardiovascular;  Laterality: N/A;   HIP FRACTURE SURGERY  06/14/2011   ball replaced (Dr. Earvin Hansen)   INTRAVASCULAR ULTRASOUND/IVUS N/A 05/03/2020   Procedure: Intravascular Ultrasound/IVUS;  Surgeon: [name], MD;  Location: [place] [facility];  Service: Cardiovascular;  Laterality: N/A;   LEFT HEART CATH N/A 05/03/2020   Procedure: Left Heart Cath;  Surgeon: [name], MD;  Location: [place]  [facility];  Service: Cardiovascular;  Laterality: N/A;   MASTECTOMY Left 2004   RIGHT/LEFT HEART CATH AND CORONARY ANGIOGRAPHY N/A 12/02/2019   Procedure: RIGHT/LEFT HEART CATH AND CORONARY ANGIOGRAPHY;  Surgeon: [name], MD;  Location: [place] [facility];  Service: Cardiovascular;  Laterality: N/A;   TONSILLECTOMY  1958   Family History  Problem Relation Age of Onset   Heart attack Father 64   Heart disease Father    Sarcoidosis Mother    Ovarian cancer Other        Aunt   Diabetes Other         GP   Breast cancer Sister 23   Cancer Sister        breast   Cancer Paternal Aunt        colon/ovarian   Social History   Tobacco Use   Smoking status: Former    Packs/day: 0.50    Years: 10.00    Pack years: 5.00    Types: Cigarettes   Smokeless tobacco: Former    Quit date: 01/24/1986   Tobacco comments:    1989 quit   Substance Use Topics   Alcohol use: No   Allergies  Allergen Reactions   Codeine Other (See Comments)    Reation:  Hallucinations    Elemental Sulfur Rash   Morphine Other (See Comments)    Reaction:  Hallucinations    Ace Inhibitors Cough   Sulfonamide Derivatives Rash   Prior to Admission medications   Medication Sig Start Date End Date Taking? Authorizing Provider  Calcium Carbonate-Vitamin D 600-400 MG-UNIT tablet Take 1 tablet by mouth daily.    Yes [provider]  carvedilol (COREG) 12.5 MG tablet Take 1 tablet (12.5 mg total) by mouth 2 (two) times daily with a meal. 12/03/20  Yes End, Harrell Gave, MD  cetirizine (ZYRTEC) 10 MG tablet Take 10 mg by mouth at bedtime.    Yes [provider]  clopidogrel (PLAVIX) 75 MG tablet TAKE 1 TABLET BY MOUTH EVERY DAY 01/17/21  Yes End, Harrell Gave, MD  dapagliflozin propanediol (FARXIGA) 10 MG TABS tablet Take 1 tablet (10 mg total) by mouth daily. 05/04/20  Yes Duke, Tami Lin, PA  ENTRESTO 49-51 MG TAKE 1 TABLET BY MOUTH TWICE A DAY 12/06/20  Yes End, Harrell Gave, MD  FLUoxetine (PROZAC) 20 MG capsule Take 1 capsule by mouth once daily 10/04/20  Yes Copland, Frederico Hamman, MD  furosemide (LASIX) 40 MG tablet Take 0.5 tablets (20 mg total) by mouth daily. 02/16/21  Yes Dunn, Ryan M, PA-C  GRAPE SEED EXTRACT PO Take 1 capsule by mouth 2 (two) times daily.    Yes [provider]  Misc Natural Products (OSTEO BI-FLEX ADV JOINT SHIELD) TABS Take 1 tablet by mouth 2 (two) times daily.   Yes [provider]  Multiple Vitamin (MULTIVITAMIN WITH MINERALS) TABS tablet Take 1 tablet by  mouth daily.   Yes [provider]  rosuvastatin (CRESTOR) 10 MG tablet Take 1 tablet (10 mg total) by mouth daily. 12/03/20  Yes End, Harrell Gave, MD  spironolactone (ALDACTONE) 25 MG tablet TAKE 1 TABLET BY MOUTH EVERY DAY 09/15/20  Yes Copland, Frederico Hamman, MD  VITAMIN D PO Take 600 mg by mouth in the morning and at bedtime.   Yes [provider]    Review of Systems  Constitutional:  Negative for appetite change and fatigue.  HENT:  Negative for congestion, postnasal drip and sore throat.   Eyes: Negative.   Respiratory:  Positive for shortness  of breath (with exertion like cleaning house). Negative for chest tightness.   Cardiovascular:  Negative for chest pain, palpitations and leg swelling.  Gastrointestinal:  Negative for abdominal distention and abdominal pain.  Endocrine: Negative.   Genitourinary: Negative.   Musculoskeletal:  Positive for arthralgias (right shoulder pain improving). Negative for neck pain.  Skin: Negative.   Allergic/Immunologic: Negative.   Neurological:  Negative for dizziness and light-headedness.  Hematological:  Negative for adenopathy. Does not bruise/bleed easily.  Psychiatric/Behavioral:  Negative for dysphoric mood and sleep disturbance. The patient is not nervous/anxious.    Vitals:   03/08/21 1200  BP: 132/77  Pulse: 73  Resp: 16  Temp: 98.4 F (36.9 C)  SpO2: 94%  Weight: 208 lb 4 oz (94.5 kg)  Height: 5\' 4"  (1.626 m)   Wt Readings from Last 3 Encounters:  03/08/21 208 lb 4 oz (94.5 kg)  02/16/21 205 lb 8 oz (93.2 kg)  12/03/20 199 lb 8 oz (90.5 kg)   Lab Results  Component Value Date   CREATININE 0.74 02/16/2021   CREATININE 0.68 12/03/2020   CREATININE 0.65 08/26/2020   Physical Exam Vitals and nursing note reviewed.  Constitutional:      Appearance: Normal appearance.  HENT:     Head: Normocephalic and atraumatic.  Cardiovascular:     Rate and Rhythm: Normal rate and regular rhythm.  Pulmonary:     Effort:  Pulmonary effort is normal. No respiratory distress.     Breath sounds: No wheezing or rales.  Abdominal:     General: Abdomen is flat. There is no distension.     Palpations: Abdomen is soft.  Musculoskeletal:        General: No tenderness.     Cervical back: Normal range of motion and neck supple.     Right lower leg: No edema.     Left lower leg: No edema.  Skin:    General: Skin is warm and dry.  Neurological:     General: No focal deficit present.     Mental Status: She is alert and oriented to person, place, and time.  Psychiatric:        Mood and Affect: Mood normal.        Behavior: Behavior normal.        Thought Content: Thought content normal.   Assessment & Plan:  1: Chronic heart failure with reduced ejection fraction- - NYHA class II - euvolemic today - weighing daily; reminded to call for an overnight weight gain of >2 pounds or a weekly weight gain of >5 pounds - weight up 13 pounds from last visit here 5 months ago; admits to eating "too much"; encouraged her to be mindful of how much she is eating - not adding salt and has been reading food labels for sodium content; reminded to closely follow a 2000mg  sodium diet  - saw cardiology (Dunn) 02/16/21 - sees Dr. Quentin Ore 03/23/21 to discuss AICD - is quite active at home - on GDMT of carvedilol, farxiga, entresto and spironolactone; could consider titrating entresto and carvedilol at future visits - BNP 11/28/19 was 188.0  2: HTN- - BP looks good today - saw PCP (Copland) 08/26/20 - BMP 02/16/21 reviewed and showed sodium 137, potassium 4.0, creatinine 0.74 and GFR >60   Patient did not bring her medications nor a list. Each medication was verbally reviewed with the patient and she was encouraged to bring the bottles to every visit to confirm accuracy of list.  Due to HF  stability, will not make a return appointment for patient at this time. Advised patient to call back at anytime for any questions or to schedule  another appointment. Patient was comfortable with this plan.

## 2021-03-08 NOTE — Patient Instructions (Addendum)
Continue weighing daily and call for an overnight weight gain of > 2 pounds or a weekly weight gain of >5 pounds.    Call us in the future if you need anything

## 2021-03-23 ENCOUNTER — Ambulatory Visit (INDEPENDENT_AMBULATORY_CARE_PROVIDER_SITE_OTHER): Payer: Medicare Other | Admitting: Cardiology

## 2021-03-23 ENCOUNTER — Encounter: Payer: Self-pay | Admitting: Cardiology

## 2021-03-23 ENCOUNTER — Other Ambulatory Visit: Payer: Self-pay

## 2021-03-23 VITALS — BP 128/80 | HR 67 | Ht 64.0 in | Wt 209.0 lb

## 2021-03-23 DIAGNOSIS — I10 Essential (primary) hypertension: Secondary | ICD-10-CM

## 2021-03-23 DIAGNOSIS — I25118 Atherosclerotic heart disease of native coronary artery with other forms of angina pectoris: Secondary | ICD-10-CM | POA: Diagnosis not present

## 2021-03-23 DIAGNOSIS — I255 Ischemic cardiomyopathy: Secondary | ICD-10-CM

## 2021-03-23 DIAGNOSIS — I5022 Chronic systolic (congestive) heart failure: Secondary | ICD-10-CM | POA: Diagnosis not present

## 2021-03-23 NOTE — Patient Instructions (Addendum)
Medication Instructions:  Your physician recommends that you continue on your current medications as directed. Please refer to the Current Medication list given to you today. *If you need a refill on your cardiac medications before your next appointment, please call your pharmacy*  Lab Work: You will get lab work today:  BMP and CBC  If you have labs (blood work) drawn today and your tests are completely normal, you will receive your results only by: MyChart Message (if you have MyChart) OR A paper copy in the mail If you have any lab test that is abnormal or we need to change your treatment, we will call you to review the results.  Testing/Procedures: Your physician has recommended that you have a defibrillator inserted. An implantable cardioverter defibrillator (ICD) is a small device that is placed in your chest or, in rare cases, your abdomen. This device uses electrical pulses or shocks to help control life-threatening, irregular heartbeats that could lead the heart to suddenly stop beating (sudden cardiac arrest). Leads are attached to the ICD that goes into your heart. This is done in the hospital and usually requires an overnight stay. Please see the instruction sheet given to you today for more information.  Follow-Up: At [facility], you and your health needs are our priority.  As part of our continuing mission to provide you with exceptional heart care, we have created designated Provider Care Teams.  These Care Teams include your primary Cardiologist (physician) and Advanced Practice Providers (APPs -  Physician Assistants and Nurse Practitioners) who all work together to provide you with the care you need, when you need it.  Your next appointment:    SEE INSTRUCTION LETTER

## 2021-03-23 NOTE — H&P (View-Only) (Signed)
Electrophysiology Office Note:    Date:  03/23/2021   ID:  MALAN WERK, DOB 13-Oct-1950, MRN [id number]  PCP:  Owens Loffler, MD  [facility] Cardiologist:  [name], MD  [facility] Electrophysiologist:  Vickie Epley, MD   Referring MD: Rise Mu, PA-C   Chief Complaint: Ischemic cardiomyopathy  History of Present Illness:    [name] [name] is a 70 y.o. female who presents for an evaluation of ischemic cardiomyopathy at the request of Christell Faith, PA-C. Their medical history includes HTN, DM, CAD, chronic combined systolic and diastolic HF, L sided breast CA.  In 2021 was admitted with acute HF and found to have EF 25%. LHC showed severe disease. She underwent PCI to the LAD in 04/2020. She has been on good medical therapy with continued NYHA III symptoms.  She is being referred for consideration of ICD.  Today she is with her sister in clinic.  According to the patient and her sister, her mastectomy on the left side did not involve dissection of the lymph nodes.  She has not had reconstructive surgery on the left side.  She was never told to avoid IV access in the left side.  During today's appointment she reports exertional dyspnea.  No presyncope or syncope.  Past Medical History:  Diagnosis Date   Allergic rhinitis    Breast cancer ([place]) 2004   Left Breast Cancer   CHF (congestive heart failure) ([place])    Coronary artery disease    a. 10/2012 ETT: nl; b. 12/2012 Cath: LM nl, LAD min irregs, D1 min irregs, LCX 30p, RCA 83m, RPDA 40p, EF 50%.   Diastolic dysfunction    a. 12/2012 Echo: EF 50-55%. No rwma. Gr1 DD. Nl RV fxn. Nl PASP.   History of hepatitis B    ?--tested + by [facility]   HLD (hyperlipidemia)    HTN (hypertension)    Hyperlipidemia associated with type 2 diabetes mellitus ([place]) 12/04/2020   Osteoarthritis    Osteopenia     Past Surgical History:  Procedure Laterality Date   ABDOMINAL HYSTERECTOMY  1985   endometriosis; anatomy    BREAST SURGERY  10/2002   CARDIAC CATHETERIZATION  12/19/12   [place]; EF 50%   COLONOSCOPY  8/09   Hyperplastic polyp; repeat in 10 years   CORONARY STENT INTERVENTION N/A 05/03/2020   Procedure: CORONARY STENT INTERVENTION;  Surgeon: [name], MD;  Location: [place] [facility];  Service: Cardiovascular;  Laterality: N/A;   HIP FRACTURE SURGERY  06/14/2011   ball replaced (Dr. Earvin Hansen)   INTRAVASCULAR ULTRASOUND/IVUS N/A 05/03/2020   Procedure: Intravascular Ultrasound/IVUS;  Surgeon: [name], MD;  Location: [place] [facility];  Service: Cardiovascular;  Laterality: N/A;   LEFT HEART CATH N/A 05/03/2020   Procedure: Left Heart Cath;  Surgeon: [name], MD;  Location: [place] [facility];  Service: Cardiovascular;  Laterality: N/A;   MASTECTOMY Left 2004   RIGHT/LEFT HEART CATH AND CORONARY ANGIOGRAPHY N/A 12/02/2019   Procedure: RIGHT/LEFT HEART CATH AND CORONARY ANGIOGRAPHY;  Surgeon: [name], MD;  Location: [place] [facility];  Service: Cardiovascular;  Laterality: N/A;   TONSILLECTOMY  1958    Current Medications: Current Meds  Medication Sig   Calcium Carbonate-Vitamin D 600-400 MG-UNIT tablet Take 1 tablet by mouth daily.    carvedilol (COREG) 12.5 MG tablet Take 1 tablet (12.5 mg total) by mouth 2 (two) times daily with a meal.   cetirizine (ZYRTEC) 10 MG tablet Take 10  mg by mouth at bedtime.    clopidogrel (PLAVIX) 75 MG tablet TAKE 1 TABLET BY MOUTH EVERY DAY   dapagliflozin propanediol (FARXIGA) 10 MG TABS tablet Take 1 tablet (10 mg total) by mouth daily.   ENTRESTO 49-51 MG TAKE 1 TABLET BY MOUTH TWICE A DAY   FLUoxetine (PROZAC) 20 MG capsule Take 1 capsule by mouth once daily   furosemide (LASIX) 40 MG tablet Take 0.5 tablets (20 mg total) by mouth daily.   GRAPE SEED EXTRACT PO Take 1 capsule by mouth 2 (two) times daily.    Misc Natural Products (OSTEO BI-FLEX ADV JOINT SHIELD) TABS Take 1 tablet by mouth 2 (two) times daily.   Multiple  Vitamin (MULTIVITAMIN WITH MINERALS) TABS tablet Take 1 tablet by mouth daily.   rosuvastatin (CRESTOR) 10 MG tablet Take 1 tablet (10 mg total) by mouth daily.   spironolactone (ALDACTONE) 25 MG tablet TAKE 1 TABLET BY MOUTH EVERY DAY   VITAMIN D PO Take 600 mg by mouth in the morning and at bedtime.     Allergies:   Codeine, Elemental sulfur, Morphine, Ace inhibitors, and Sulfonamide derivatives   Social History   Socioeconomic History   Marital status: Widowed    Spouse name: (Widow) to Richardson Landry   Number of children: Not on file   Years of education: Not on file   Highest education level: Not on file  Occupational History   Not on file  Tobacco Use   Smoking status: Former    Packs/day: 0.50    Years: 10.00    Pack years: 5.00    Types: Cigarettes   Smokeless tobacco: Former    Quit date: 01/24/1986   Tobacco comments:    1989 quit   Vaping Use   Vaping Use: Never used  Substance and Sexual Activity   Alcohol use: No   Drug use: No   Sexual activity: Not on file  Other Topics Concern   Not on file  Social History Narrative   Widowed to husband Renato Gails      No regular exercise   Lives locally w/ tenants that help her w/ chores around the house.   Social Determinants of Health   Financial Resource Strain: Low Risk    Difficulty of Paying Living Expenses: Not hard at all  Food Insecurity: No Food Insecurity   Worried About Charity fundraiser in the Last Year: Never true   [place] in the Last Year: Never true  Transportation Needs: No Transportation Needs   Lack of Transportation (Medical): No   Lack of Transportation (Non-Medical): No  Physical Activity: Inactive   Days of Exercise per Week: 0 days   Minutes of Exercise per Session: 0 min  Stress: No Stress Concern Present   Feeling of Stress : Not at all  Social Connections: Moderately Isolated   Frequency of Communication with Friends and Family: Twice a week   Frequency of Social  Gatherings with Friends and Family: Twice a week   Attends Religious Services: More than 4 times per year   Active Member of [employer] or Organizations: No   Attends Archivist Meetings: Never   Marital Status: Widowed     Family History: The patient's family history includes Breast cancer (age of onset: 42) in her sister; Cancer in her paternal aunt and sister; Diabetes in an other family member; Heart attack (age of onset: 70) in her father; Heart disease in her father; Ovarian  cancer in an other family member; Sarcoidosis in her mother.  ROS:   Please see the history of present illness.    All other systems reviewed and are negative.  EKGs/Labs/Other Studies Reviewed:    The following studies were reviewed today:  04/13/2020 CMR IMPRESSION: 1. Severe left ventricular enlargement with severely reduced LV function, LVEF 33%. Akinesis of inferior and inferolateral walls.   2. Post-contrast delayed myocardial enhancement in the inferior and inferolateral walls, with greater than 50% of the myocardial thickness involved suggesting no viability in the RCA territory.   3.  Normal right ventricular size and systolic function.          EKG:  The ekg ordered today demonstrates sinus rhythm with a QRS duration 118 ms.  QTc 440 ms.  Recent Labs: 07/17/2020: Magnesium 2.4 08/26/2020: ALT 17; Hemoglobin 14.8; Platelets 345.0; TSH 1.79 02/16/2021: BUN 16; Creatinine, Ser 0.74; Potassium 4.0; Sodium 137  Recent Lipid Panel    Component Value Date/Time   CHOL 187 08/26/2020 1538   TRIG 262.0 (H) 08/26/2020 1538   HDL 46.60 08/26/2020 1538   CHOLHDL 4 08/26/2020 1538   VLDL 52.4 (H) 08/26/2020 1538   LDLCALC 48 05/04/2020 0812   LDLDIRECT 50.0 08/26/2020 1538    Physical Exam:    VS:  BP 128/80 (BP Location: Left Arm, Patient Position: Sitting, Cuff Size: Large)   Pulse 67   Ht 5\' 4"  (1.626 m)   Wt 209 lb (94.8 kg)   SpO2 92%   BMI 35.87 kg/m     Wt Readings from  Last 3 Encounters:  03/23/21 209 lb (94.8 kg)  03/08/21 208 lb 4 oz (94.5 kg)  02/16/21 205 lb 8 oz (93.2 kg)     GEN:  Well nourished, well developed in no acute distress HEENT: Normal NECK: No JVD; No carotid bruits LYMPHATICS: No lymphadenopathy CARDIAC: RRR, no murmurs, rubs, gallops RESPIRATORY:  Clear to auscultation without rales, wheezing or rhonchi  ABDOMEN: Soft, non-tender, non-distended MUSCULOSKELETAL:  No edema; No deformity  SKIN: Warm and dry NEUROLOGIC:  Alert and oriented x 3 PSYCHIATRIC:  Normal affect   ASSESSMENT:    1. Chronic HFrEF (heart failure with reduced ejection fraction) ([place])   2. Coronary artery disease of native artery of native heart with stable angina pectoris ([place])   3. Ischemic cardiomyopathy   4. Essential hypertension    PLAN:    In order of problems listed above:   1. Chronic HFrEF (heart failure with reduced ejection fraction) (HCC) NYHA class III.  Warm and dry on my exam.  She has been on stable medical therapy but has a persistently reduced ejection fraction at 30 to 35%.  We discussed using an ICD for primary prevention of sudden cardiac death.  We discussed the procedure, risks and recovery time and she wishes to proceed with scheduling.  I will plan to perform a left-sided venogram prior to draping the patient to confirm the left subclavian vein is patent.  If there is any question of the patency of the left subclavian vein, we will plan to implant a dual coil right sided ICD followed by DFT testing.  I would not perform DFT testing if I am able to implant a left-sided device.  Her device will be a VVI ICD.  The patient has an ischemic CM (EF 30%), NYHA Class III CHF, and CAD.  He is referred by Christell Faith, PA-C for risk stratification of sudden death and consideration of ICD implantation.  At  this time, she meets criteria for ICD implantation for primary prevention of sudden death.  I have had a thorough discussion with the patient  reviewing options.  The patient and their family (if available) have had opportunities to ask questions and have them answered. The patient and I have decided together through a shared decision making process to proceed with implanting a VVI ICD at this time.   Risks, benefits, alternatives to ICD implantation were discussed in detail with the patient today. The patient understands that the risks include but are not limited to bleeding, infection, pneumothorax, perforation, tamponade, vascular damage, renal failure, MI, stroke, death, inappropriate shocks, and lead dislodgement and wishes to proceed.  We will therefore schedule device implantation at the next available time.  Continue medical therapy with Coreg, Lisabeth Register and spironolactone.  2. Coronary artery disease of native artery of native heart with stable angina pectoris (HCC) No ischemic symptoms.  Continue clopidogrel until 3 days prior to the procedure which is about 1 year from her PCI.  3. Ischemic cardiomyopathy   4. Essential hypertension Controlled during today's visit.  Continue Coreg, Entresto, spironolactone.       Total time spent with patient today 65 minutes. This includes reviewing records, evaluating the patient and coordinating care.  Medication Adjustments/Labs and Tests Ordered: Current medicines are reviewed at length with the patient today.  Concerns regarding medicines are outlined above.  Orders Placed This Encounter  Procedures   CBC w/Diff   Basic Metabolic Panel (BMET)   EKG 12-Lead   No orders of the defined types were placed in this encounter.    Signed, Hilton Cork. Quentin Ore, MD, [facility], [facility] 03/23/2021 6:46 PM    Electrophysiology [facility]

## 2021-03-23 NOTE — Progress Notes (Signed)
Electrophysiology Office Note:    Date:  03/23/2021   ID:  [name] [name], DOB 1951/08/26, MRN [id number]  PCP:  Owens Loffler, MD  [facility] Cardiologist:  [name], MD  [facility] Electrophysiologist:  Vickie Epley, MD   Referring MD: Rise Mu, PA-C   Chief Complaint: Ischemic cardiomyopathy  History of Present Illness:    [name] [name] is a 70 y.o. female who presents for an evaluation of ischemic cardiomyopathy at the request of Christell Faith, PA-C. Their medical history includes HTN, DM, CAD, chronic combined systolic and diastolic HF, L sided breast CA.  In 2021 was admitted with acute HF and found to have EF 25%. LHC showed severe disease. She underwent PCI to the LAD in 04/2020. She has been on good medical therapy with continued NYHA III symptoms.  She is being referred for consideration of ICD.  Today she is with her sister in clinic.  According to the patient and her sister, her mastectomy on the left side did not involve dissection of the lymph nodes.  She has not had reconstructive surgery on the left side.  She was never told to avoid IV access in the left side.  During today's appointment she reports exertional dyspnea.  No presyncope or syncope.  Past Medical History:  Diagnosis Date   Allergic rhinitis    Breast cancer ([place]) 2004   Left Breast Cancer   CHF (congestive heart failure) ([place])    Coronary artery disease    a. 10/2012 ETT: nl; b. 12/2012 Cath: LM nl, LAD min irregs, D1 min irregs, LCX 30p, RCA 26m, RPDA 40p, EF 50%.   Diastolic dysfunction    a. 12/2012 Echo: EF 50-55%. No rwma. Gr1 DD. Nl RV fxn. Nl PASP.   History of hepatitis B    ?--tested + by [facility]   HLD (hyperlipidemia)    HTN (hypertension)    Hyperlipidemia associated with type 2 diabetes mellitus ([place]) 12/04/2020   Osteoarthritis    Osteopenia     Past Surgical History:  Procedure Laterality Date   ABDOMINAL HYSTERECTOMY  1985   endometriosis; anatomy    BREAST SURGERY  10/2002   CARDIAC CATHETERIZATION  12/19/12   [place]; EF 50%   COLONOSCOPY  8/09   Hyperplastic polyp; repeat in 10 years   CORONARY STENT INTERVENTION N/A 05/03/2020   Procedure: CORONARY STENT INTERVENTION;  Surgeon: [name], MD;  Location: [place] [facility];  Service: Cardiovascular;  Laterality: N/A;   HIP FRACTURE SURGERY  06/14/2011   ball replaced (Dr. Earvin Hansen)   INTRAVASCULAR ULTRASOUND/IVUS N/A 05/03/2020   Procedure: Intravascular Ultrasound/IVUS;  Surgeon: [name], MD;  Location: [place] [facility];  Service: Cardiovascular;  Laterality: N/A;   LEFT HEART CATH N/A 05/03/2020   Procedure: Left Heart Cath;  Surgeon: [name], MD;  Location: [place] [facility];  Service: Cardiovascular;  Laterality: N/A;   MASTECTOMY Left 2004   RIGHT/LEFT HEART CATH AND CORONARY ANGIOGRAPHY N/A 12/02/2019   Procedure: RIGHT/LEFT HEART CATH AND CORONARY ANGIOGRAPHY;  Surgeon: [name], MD;  Location: [place] [facility];  Service: Cardiovascular;  Laterality: N/A;   TONSILLECTOMY  1958    Current Medications: Current Meds  Medication Sig   Calcium Carbonate-Vitamin D 600-400 MG-UNIT tablet Take 1 tablet by mouth daily.    carvedilol (COREG) 12.5 MG tablet Take 1 tablet (12.5 mg total) by mouth 2 (two) times daily with a meal.   cetirizine (ZYRTEC) 10 MG tablet Take 10  mg by mouth at bedtime.    clopidogrel (PLAVIX) 75 MG tablet TAKE 1 TABLET BY MOUTH EVERY DAY   dapagliflozin propanediol (FARXIGA) 10 MG TABS tablet Take 1 tablet (10 mg total) by mouth daily.   ENTRESTO 49-51 MG TAKE 1 TABLET BY MOUTH TWICE A DAY   FLUoxetine (PROZAC) 20 MG capsule Take 1 capsule by mouth once daily   furosemide (LASIX) 40 MG tablet Take 0.5 tablets (20 mg total) by mouth daily.   GRAPE SEED EXTRACT PO Take 1 capsule by mouth 2 (two) times daily.    Misc Natural Products (OSTEO BI-FLEX ADV JOINT SHIELD) TABS Take 1 tablet by mouth 2 (two) times daily.   Multiple  Vitamin (MULTIVITAMIN WITH MINERALS) TABS tablet Take 1 tablet by mouth daily.   rosuvastatin (CRESTOR) 10 MG tablet Take 1 tablet (10 mg total) by mouth daily.   spironolactone (ALDACTONE) 25 MG tablet TAKE 1 TABLET BY MOUTH EVERY DAY   VITAMIN D PO Take 600 mg by mouth in the morning and at bedtime.     Allergies:   Codeine, Elemental sulfur, Morphine, Ace inhibitors, and Sulfonamide derivatives   Social History   Socioeconomic History   Marital status: Widowed    Spouse name: (Widow) to Richardson Landry   Number of children: Not on file   Years of education: Not on file   Highest education level: Not on file  Occupational History   Not on file  Tobacco Use   Smoking status: Former    Packs/day: 0.50    Years: 10.00    Pack years: 5.00    Types: Cigarettes   Smokeless tobacco: Former    Quit date: 01/24/1986   Tobacco comments:    1989 quit   Vaping Use   Vaping Use: Never used  Substance and Sexual Activity   Alcohol use: No   Drug use: No   Sexual activity: Not on file  Other Topics Concern   Not on file  Social History Narrative   Widowed to husband Renato Gails      No regular exercise   Lives locally w/ tenants that help her w/ chores around the house.   Social Determinants of Health   Financial Resource Strain: Low Risk    Difficulty of Paying Living Expenses: Not hard at all  Food Insecurity: No Food Insecurity   Worried About Charity fundraiser in the Last Year: Never true   [place] in the Last Year: Never true  Transportation Needs: No Transportation Needs   Lack of Transportation (Medical): No   Lack of Transportation (Non-Medical): No  Physical Activity: Inactive   Days of Exercise per Week: 0 days   Minutes of Exercise per Session: 0 min  Stress: No Stress Concern Present   Feeling of Stress : Not at all  Social Connections: Moderately Isolated   Frequency of Communication with Friends and Family: Twice a week   Frequency of Social  Gatherings with Friends and Family: Twice a week   Attends Religious Services: More than 4 times per year   Active Member of [employer] or Organizations: No   Attends Archivist Meetings: Never   Marital Status: Widowed     Family History: The patient's family history includes Breast cancer (age of onset: 54) in her sister; Cancer in her paternal aunt and sister; Diabetes in an other family member; Heart attack (age of onset: 68) in her father; Heart disease in her father; Ovarian  cancer in an other family member; Sarcoidosis in her mother.  ROS:   Please see the history of present illness.    All other systems reviewed and are negative.  EKGs/Labs/Other Studies Reviewed:    The following studies were reviewed today:  04/13/2020 CMR IMPRESSION: 1. Severe left ventricular enlargement with severely reduced LV function, LVEF 33%. Akinesis of inferior and inferolateral walls.   2. Post-contrast delayed myocardial enhancement in the inferior and inferolateral walls, with greater than 50% of the myocardial thickness involved suggesting no viability in the RCA territory.   3.  Normal right ventricular size and systolic function.          EKG:  The ekg ordered today demonstrates sinus rhythm with a QRS duration 118 ms.  QTc 440 ms.  Recent Labs: 07/17/2020: Magnesium 2.4 08/26/2020: ALT 17; Hemoglobin 14.8; Platelets 345.0; TSH 1.79 02/16/2021: BUN 16; Creatinine, Ser 0.74; Potassium 4.0; Sodium 137  Recent Lipid Panel    Component Value Date/Time   CHOL 187 08/26/2020 1538   TRIG 262.0 (H) 08/26/2020 1538   HDL 46.60 08/26/2020 1538   CHOLHDL 4 08/26/2020 1538   VLDL 52.4 (H) 08/26/2020 1538   LDLCALC 48 05/04/2020 0812   LDLDIRECT 50.0 08/26/2020 1538    Physical Exam:    VS:  BP 128/80 (BP Location: Left Arm, Patient Position: Sitting, Cuff Size: Large)   Pulse 67   Ht 5\' 4"  (1.626 m)   Wt 209 lb (94.8 kg)   SpO2 92%   BMI 35.87 kg/m     Wt Readings from  Last 3 Encounters:  03/23/21 209 lb (94.8 kg)  03/08/21 208 lb 4 oz (94.5 kg)  02/16/21 205 lb 8 oz (93.2 kg)     GEN:  Well nourished, well developed in no acute distress HEENT: Normal NECK: No JVD; No carotid bruits LYMPHATICS: No lymphadenopathy CARDIAC: RRR, no murmurs, rubs, gallops RESPIRATORY:  Clear to auscultation without rales, wheezing or rhonchi  ABDOMEN: Soft, non-tender, non-distended MUSCULOSKELETAL:  No edema; No deformity  SKIN: Warm and dry NEUROLOGIC:  Alert and oriented x 3 PSYCHIATRIC:  Normal affect   ASSESSMENT:    1. Chronic HFrEF (heart failure with reduced ejection fraction) ([place])   2. Coronary artery disease of native artery of native heart with stable angina pectoris ([place])   3. Ischemic cardiomyopathy   4. Essential hypertension    PLAN:    In order of problems listed above:   1. Chronic HFrEF (heart failure with reduced ejection fraction) (HCC) NYHA class III.  Warm and dry on my exam.  She has been on stable medical therapy but has a persistently reduced ejection fraction at 30 to 35%.  We discussed using an ICD for primary prevention of sudden cardiac death.  We discussed the procedure, risks and recovery time and she wishes to proceed with scheduling.  I will plan to perform a left-sided venogram prior to draping the patient to confirm the left subclavian vein is patent.  If there is any question of the patency of the left subclavian vein, we will plan to implant a dual coil right sided ICD followed by DFT testing.  I would not perform DFT testing if I am able to implant a left-sided device.  Her device will be a VVI ICD.  The patient has an ischemic CM (EF 30%), NYHA Class III CHF, and CAD.  He is referred by Christell Faith, PA-C for risk stratification of sudden death and consideration of ICD implantation.  At  this time, she meets criteria for ICD implantation for primary prevention of sudden death.  I have had a thorough discussion with the patient  reviewing options.  The patient and their family (if available) have had opportunities to ask questions and have them answered. The patient and I have decided together through a shared decision making process to proceed with implanting a VVI ICD at this time.   Risks, benefits, alternatives to ICD implantation were discussed in detail with the patient today. The patient understands that the risks include but are not limited to bleeding, infection, pneumothorax, perforation, tamponade, vascular damage, renal failure, MI, stroke, death, inappropriate shocks, and lead dislodgement and wishes to proceed.  We will therefore schedule device implantation at the next available time.  Continue medical therapy with Coreg, Lisabeth Register and spironolactone.  2. Coronary artery disease of native artery of native heart with stable angina pectoris (HCC) No ischemic symptoms.  Continue clopidogrel until 3 days prior to the procedure which is about 1 year from her PCI.  3. Ischemic cardiomyopathy   4. Essential hypertension Controlled during today's visit.  Continue Coreg, Entresto, spironolactone.       Total time spent with patient today 65 minutes. This includes reviewing records, evaluating the patient and coordinating care.  Medication Adjustments/Labs and Tests Ordered: Current medicines are reviewed at length with the patient today.  Concerns regarding medicines are outlined above.  Orders Placed This Encounter  Procedures   CBC w/Diff   Basic Metabolic Panel (BMET)   EKG 12-Lead   No orders of the defined types were placed in this encounter.    Signed, Hilton Cork. Quentin Ore, MD, [facility], [facility] 03/23/2021 6:46 PM    Electrophysiology [facility]

## 2021-03-24 LAB — BASIC METABOLIC PANEL
BUN/Creatinine Ratio: 22 (ref 12–28)
BUN: 15 mg/dL (ref 8–27)
CO2: 24 mmol/L (ref 20–29)
Calcium: 10.6 mg/dL — ABNORMAL HIGH (ref 8.7–10.3)
Chloride: 100 mmol/L (ref 96–106)
Creatinine, Ser: 0.67 mg/dL (ref 0.57–1.00)
Glucose: 101 mg/dL — ABNORMAL HIGH (ref 65–99)
Potassium: 4.7 mmol/L (ref 3.5–5.2)
Sodium: 139 mmol/L (ref 134–144)
eGFR: 95 mL/min/{1.73_m2} (ref >59)

## 2021-03-24 LAB — CBC WITH DIFFERENTIAL/PLATELET
Basophils Absolute: 0.1 10*3/uL (ref 0.0–0.2)
Basos: 0 %
EOS (ABSOLUTE): 0.3 10*3/uL (ref 0.0–0.4)
Eos: 2 %
Hematocrit: 45.6 % (ref 34.0–46.6)
Hemoglobin: 15.3 g/dL (ref 11.1–15.9)
Immature Grans (Abs): 0.1 10*3/uL (ref 0.0–0.1)
Immature Granulocytes: 1 %
Lymphocytes Absolute: 2.7 10*3/uL (ref 0.7–3.1)
Lymphs: 24 %
MCH: 29.9 pg (ref 26.6–33.0)
MCHC: 33.6 g/dL (ref 31.5–35.7)
MCV: 89 fL (ref 79–97)
Monocytes Absolute: 1 10*3/uL — ABNORMAL HIGH (ref 0.1–0.9)
Monocytes: 9 %
Neutrophils Absolute: 7.2 10*3/uL — ABNORMAL HIGH (ref 1.4–7.0)
Neutrophils: 64 %
Platelets: 313 10*3/uL (ref 150–450)
RBC: 5.12 x10E6/uL (ref 3.77–5.28)
RDW: 13 % (ref 11.7–15.4)
WBC: 11.3 10*3/uL — ABNORMAL HIGH (ref 3.4–10.8)

## 2021-03-25 DIAGNOSIS — H04123 Dry eye syndrome of bilateral lacrimal glands: Secondary | ICD-10-CM

## 2021-03-25 HISTORY — DX: Dry eye syndrome of bilateral lacrimal glands: H04.123

## 2021-03-30 NOTE — Pre-Procedure Instructions (Signed)
Instructed patient on the following items: Arrival time 0530 Nothing to eat or drink after midnight No meds AM of procedure Responsible person to drive you home and stay with you for 24 hrs Wash with special soap night before and morning of procedure If on anti-coagulant drug instructions Plavix last dose was 03/27/21

## 2021-03-30 NOTE — Anesthesia Preprocedure Evaluation (Addendum)
Anesthesia Evaluation  Patient identified by MRN, date of birth, ID band Patient awake    Reviewed: Allergy & Precautions, NPO status , Patient's Chart, lab work & pertinent test results, reviewed documented beta blocker date and time   History of Anesthesia Complications Negative for: history of anesthetic complications  Airway Mallampati: II  TM Distance: >3 FB Neck ROM: Full    Dental  (+) Missing,    Pulmonary former smoker,    Pulmonary exam normal        Cardiovascular hypertension, Pt. on medications and Pt. on home beta blockers + CAD (on Plavix) and +CHF  Normal cardiovascular exam  TTE 01/2021: EF 30-35%, global hypokinesis, moderate to severe LVE, grade 1 DD, akinesis of the left ventricular, basal-mid inferior wall, valves ok (on 2021 TTE)   Neuro/Psych Depression negative neurological ROS     GI/Hepatic negative GI ROS, Neg liver ROS,   Endo/Other  diabetes, Type 2BMI 36  Renal/GU negative Renal ROS  negative genitourinary   Musculoskeletal  (+) Arthritis ,   Abdominal   Peds  Hematology negative hematology ROS (+)   Anesthesia Other Findings Day of surgery medications reviewed with patient.  Reproductive/Obstetrics negative OB ROS                            Anesthesia Physical Anesthesia Plan  ASA: 4  Anesthesia Plan: General   Post-op Pain Management:    Induction: Intravenous  PONV Risk Score and Plan: 3 and Treatment may vary due to age or medical condition, Ondansetron and Dexamethasone  Airway Management Planned: Oral ETT  Additional Equipment:   Intra-op Plan:   Post-operative Plan: Extubation in OR  Informed Consent: I have reviewed the patients History and Physical, chart, labs and discussed the procedure including the risks, benefits and alternatives for the proposed anesthesia with the patient or authorized representative who has indicated his/her  understanding and acceptance.     Dental advisory given  Plan Discussed with: CRNA  Anesthesia Plan Comments:        Anesthesia Quick Evaluation

## 2021-03-31 ENCOUNTER — Ambulatory Visit (HOSPITAL_COMMUNITY)
Admission: RE | Disposition: A | Discharge status: Home / Self Care | Payer: Self-pay | Source: Ambulatory Visit | Attending: Cardiology

## 2021-03-31 ENCOUNTER — Ambulatory Visit (HOSPITAL_COMMUNITY)
Admission: RE | Admit: 2021-03-31 | Discharge: 2021-03-31 | Disposition: A | Discharge status: Home / Self Care | Payer: Medicare Other | Source: Ambulatory Visit | Attending: Cardiology | Admitting: Cardiology

## 2021-03-31 ENCOUNTER — Ambulatory Visit (HOSPITAL_COMMUNITY): Payer: Medicare Other

## 2021-03-31 ENCOUNTER — Ambulatory Visit (HOSPITAL_COMMUNITY): Payer: Medicare Other | Admitting: Anesthesiology

## 2021-03-31 ENCOUNTER — Other Ambulatory Visit: Payer: Self-pay

## 2021-03-31 DIAGNOSIS — Z7984 Long term (current) use of oral hypoglycemic drugs: Secondary | ICD-10-CM | POA: Diagnosis not present

## 2021-03-31 DIAGNOSIS — I110 Hypertensive heart disease with heart failure: Secondary | ICD-10-CM | POA: Insufficient documentation

## 2021-03-31 DIAGNOSIS — Z8249 Family history of ischemic heart disease and other diseases of the circulatory system: Secondary | ICD-10-CM | POA: Diagnosis not present

## 2021-03-31 DIAGNOSIS — Z885 Allergy status to narcotic agent status: Secondary | ICD-10-CM | POA: Insufficient documentation

## 2021-03-31 DIAGNOSIS — Z87891 Personal history of nicotine dependence: Secondary | ICD-10-CM | POA: Diagnosis not present

## 2021-03-31 DIAGNOSIS — Z7902 Long term (current) use of antithrombotics/antiplatelets: Secondary | ICD-10-CM | POA: Diagnosis not present

## 2021-03-31 DIAGNOSIS — I5022 Chronic systolic (congestive) heart failure: Secondary | ICD-10-CM | POA: Diagnosis not present

## 2021-03-31 DIAGNOSIS — Z79899 Other long term (current) drug therapy: Secondary | ICD-10-CM | POA: Insufficient documentation

## 2021-03-31 DIAGNOSIS — I255 Ischemic cardiomyopathy: Secondary | ICD-10-CM | POA: Insufficient documentation

## 2021-03-31 DIAGNOSIS — E782 Mixed hyperlipidemia: Secondary | ICD-10-CM | POA: Diagnosis not present

## 2021-03-31 DIAGNOSIS — I5042 Chronic combined systolic (congestive) and diastolic (congestive) heart failure: Secondary | ICD-10-CM | POA: Diagnosis not present

## 2021-03-31 DIAGNOSIS — Z882 Allergy status to sulfonamides status: Secondary | ICD-10-CM | POA: Diagnosis not present

## 2021-03-31 DIAGNOSIS — I25118 Atherosclerotic heart disease of native coronary artery with other forms of angina pectoris: Secondary | ICD-10-CM | POA: Insufficient documentation

## 2021-03-31 DIAGNOSIS — Z950 Presence of cardiac pacemaker: Secondary | ICD-10-CM | POA: Diagnosis not present

## 2021-03-31 DIAGNOSIS — E119 Type 2 diabetes mellitus without complications: Secondary | ICD-10-CM | POA: Insufficient documentation

## 2021-03-31 DIAGNOSIS — I509 Heart failure, unspecified: Secondary | ICD-10-CM | POA: Diagnosis not present

## 2021-03-31 DIAGNOSIS — Z95810 Presence of automatic (implantable) cardiac defibrillator: Secondary | ICD-10-CM

## 2021-03-31 HISTORY — PX: ICD IMPLANT: EP1208

## 2021-03-31 SURGERY — ICD IMPLANT
Anesthesia: General

## 2021-03-31 MED ORDER — ENTRESTO 49-51 MG PO TABS: 1.0000 | ORAL_TABLET | Freq: Two times a day (BID) | ORAL | Status: DC

## 2021-03-31 MED ORDER — ONDANSETRON HCL 4 MG/2ML IJ SOLN: 4.0000 mg | Freq: Four times a day (QID) | INTRAMUSCULAR | Status: DC | PRN

## 2021-03-31 MED ORDER — SODIUM CHLORIDE 0.9 % IV SOLN
INTRAVENOUS | Status: AC
  Filled 2021-03-31: qty 2

## 2021-03-31 MED ORDER — PHENYLEPHRINE HCL-NACL 10-0.9 MG/250ML-% IV SOLN
INTRAVENOUS | Status: DC | PRN
  Administered 2021-03-31: 50 ug/min via INTRAVENOUS

## 2021-03-31 MED ORDER — SODIUM CHLORIDE 0.9 % IV SOLN
80.0000 mg | INTRAVENOUS | Status: AC
  Administered 2021-03-31: 80 mg
  Filled 2021-03-31: qty 2

## 2021-03-31 MED ORDER — IOHEXOL 350 MG/ML SOLN
INTRAVENOUS | Status: DC | PRN
  Administered 2021-03-31: 15 mL

## 2021-03-31 MED ORDER — PROPOFOL 10 MG/ML IV BOLUS
INTRAVENOUS | Status: DC | PRN
  Administered 2021-03-31 (×2): 30 mg via INTRAVENOUS
  Administered 2021-03-31: 120 mg via INTRAVENOUS

## 2021-03-31 MED ORDER — CHLORHEXIDINE GLUCONATE 4 % EX LIQD: 4.0000 "application " | Freq: Once | CUTANEOUS | Status: DC

## 2021-03-31 MED ORDER — FLUOXETINE HCL 20 MG PO CAPS: 20.0000 mg | ORAL_CAPSULE | Freq: Every day | ORAL | Status: DC

## 2021-03-31 MED ORDER — POVIDONE-IODINE 10 % EX SWAB: 2.0000 "application " | Freq: Once | CUTANEOUS | Status: DC

## 2021-03-31 MED ORDER — HEPARIN (PORCINE) IN NACL 1000-0.9 UT/500ML-% IV SOLN
INTRAVENOUS | Status: AC
  Filled 2021-03-31: qty 500

## 2021-03-31 MED ORDER — DEXAMETHASONE SODIUM PHOSPHATE 10 MG/ML IJ SOLN
INTRAMUSCULAR | Status: DC | PRN
  Administered 2021-03-31: 10 mg via INTRAVENOUS

## 2021-03-31 MED ORDER — HEPARIN (PORCINE) IN NACL 1000-0.9 UT/500ML-% IV SOLN
INTRAVENOUS | Status: DC | PRN
  Administered 2021-03-31: 500 mL

## 2021-03-31 MED ORDER — LIDOCAINE HCL (PF) 1 % IJ SOLN
INTRAMUSCULAR | Status: AC
  Filled 2021-03-31: qty 30

## 2021-03-31 MED ORDER — LIDOCAINE 2% (20 MG/ML) 5 ML SYRINGE
INTRAMUSCULAR | Status: DC | PRN
  Administered 2021-03-31: 100 mg via INTRAVENOUS

## 2021-03-31 MED ORDER — PHENYLEPHRINE 40 MCG/ML (10ML) SYRINGE FOR IV PUSH (FOR BLOOD PRESSURE SUPPORT)
PREFILLED_SYRINGE | INTRAVENOUS | Status: DC | PRN
  Administered 2021-03-31: 80 ug via INTRAVENOUS
  Administered 2021-03-31: 160 ug via INTRAVENOUS
  Administered 2021-03-31: 80 ug via INTRAVENOUS

## 2021-03-31 MED ORDER — ACETAMINOPHEN 325 MG PO TABS: 325.0000 mg | ORAL_TABLET | ORAL | Status: DC | PRN

## 2021-03-31 MED ORDER — CLOPIDOGREL BISULFATE 75 MG PO TABS: 75.0000 mg | ORAL_TABLET | Freq: Every day | ORAL | 0 refills | Status: DC

## 2021-03-31 MED ORDER — CEFAZOLIN SODIUM-DEXTROSE 2-4 GM/100ML-% IV SOLN
2.0000 g | INTRAVENOUS | Status: AC
  Administered 2021-03-31: 2 g via INTRAVENOUS

## 2021-03-31 MED ORDER — ONDANSETRON HCL 4 MG/2ML IJ SOLN
INTRAMUSCULAR | Status: DC | PRN
  Administered 2021-03-31: 4 mg via INTRAVENOUS

## 2021-03-31 MED ORDER — SODIUM CHLORIDE 0.9 % IV SOLN: INTRAVENOUS | Status: DC

## 2021-03-31 MED ORDER — LIDOCAINE HCL (PF) 1 % IJ SOLN
INTRAMUSCULAR | Status: AC
  Filled 2021-03-31: qty 60

## 2021-03-31 MED ORDER — CEFAZOLIN SODIUM-DEXTROSE 2-4 GM/100ML-% IV SOLN
INTRAVENOUS | Status: AC
  Filled 2021-03-31: qty 100

## 2021-03-31 MED ORDER — EPHEDRINE SULFATE-NACL 50-0.9 MG/10ML-% IV SOSY
PREFILLED_SYRINGE | INTRAVENOUS | Status: DC | PRN
  Administered 2021-03-31 (×2): 15 mg via INTRAVENOUS
  Administered 2021-03-31: 10 mg via INTRAVENOUS

## 2021-03-31 MED ORDER — SUGAMMADEX SODIUM 200 MG/2ML IV SOLN
INTRAVENOUS | Status: DC | PRN
  Administered 2021-03-31: 1 mg via INTRAVENOUS

## 2021-03-31 MED ORDER — ROCURONIUM BROMIDE 10 MG/ML (PF) SYRINGE
PREFILLED_SYRINGE | INTRAVENOUS | Status: DC | PRN
  Administered 2021-03-31: 60 mg via INTRAVENOUS

## 2021-03-31 MED ORDER — FENTANYL CITRATE (PF) 250 MCG/5ML IJ SOLN
INTRAMUSCULAR | Status: DC | PRN
  Administered 2021-03-31: 100 ug via INTRAVENOUS

## 2021-03-31 MED ORDER — SPIRONOLACTONE 25 MG PO TABS
25.0000 mg | ORAL_TABLET | Freq: Every day | ORAL | Status: DC
Start: 2021-03-31 — End: 2021-09-16

## 2021-03-31 SURGICAL SUPPLY — 7 items
CABLE SURGICAL S-101-97-12 (CABLE) ×2 IMPLANT
ICD ACTICOR DX (ICD Generator) ×2 IMPLANT
KIT MICROPUNCTURE NIT STIFF (SHEATH) ×2 IMPLANT
LEAD PLEXA 65/15 (Lead) ×2 IMPLANT
PAD PRO RADIOLUCENT 2001M-C (PAD) ×2 IMPLANT
SHEATH 8FR PRELUDE SNAP 13 (SHEATH) ×2 IMPLANT
TRAY PACEMAKER INSERTION (PACKS) ×2 IMPLANT

## 2021-03-31 NOTE — Interval H&P Note (Signed)
History and Physical Interval Note:  03/31/2021 7:42 AM  [name] [name]  has presented today for surgery, with the diagnosis of cardiomyopathy.  The various methods of treatment have been discussed with the patient and family. After consideration of risks, benefits and other options for treatment, the patient has consented to  Procedure(s): ICD IMPLANT (N/A) as a surgical intervention.  The patient's history has been reviewed, patient examined, no change in status, stable for surgery.  I have reviewed the patient's chart and labs.  Questions were answered to the patient's satisfaction.     Chisum Habenicht T Alando Colleran

## 2021-03-31 NOTE — Anesthesia Procedure Notes (Signed)
Procedure Name: Intubation Date/Time: 03/31/2021 7:59 AM Performed by: Lance Coon, CRNA Pre-anesthesia Checklist: Patient identified, Emergency Drugs available, Suction available, Patient being monitored and Timeout performed Patient Re-evaluated:Patient Re-evaluated prior to induction Oxygen Delivery Method: Circle system utilized Preoxygenation: Pre-oxygenation with 100% oxygen Induction Type: IV induction Ventilation: Mask ventilation without difficulty Laryngoscope Size: Miller and 3 Grade View: Grade I Tube type: Oral Tube size: 7.0 mm Number of attempts: 1 Airway Equipment and Method: Stylet Placement Confirmation: ETT inserted through vocal cords under direct vision, positive ETCO2 and breath sounds checked- equal and bilateral Secured at: 21 cm Tube secured with: Tape Dental Injury: Teeth and Oropharynx as per pre-operative assessment

## 2021-03-31 NOTE — Anesthesia Procedure Notes (Signed)
Arterial Line Insertion Start/End7/21/2022 7:10 AM, 03/31/2021 7:25 AM Performed by: Lance Coon, CRNA, CRNA  Patient location: Pre-op. Preanesthetic checklist: patient identified, IV checked, site marked, risks and benefits discussed, surgical consent, monitors and equipment checked, pre-op evaluation, timeout performed and anesthesia consent Lidocaine 1% used for infiltration Right, radial was placed Catheter size: 20 G Hand hygiene performed , maximum sterile barriers used  and Seldinger technique used  Attempts: 2 Procedure performed without using ultrasound guided technique. Following insertion, dressing applied and Biopatch. Post procedure assessment: normal  Patient tolerated the procedure well with no immediate complications.

## 2021-03-31 NOTE — Progress Notes (Signed)
Pt ambulated without difficulty or bleeding.   Discharged home with her sister who will drive and stay with pt x 24 hrs.

## 2021-03-31 NOTE — Anesthesia Postprocedure Evaluation (Signed)
Anesthesia Post Note  Patient: [name] [name]  Procedure(s) Performed: ICD IMPLANT     Patient location during evaluation: PACU Anesthesia Type: General Level of consciousness: awake and alert and oriented Pain management: pain level controlled Vital Signs Assessment: post-procedure vital signs reviewed and stable Respiratory status: spontaneous breathing, nonlabored ventilation and respiratory function stable Cardiovascular status: blood pressure returned to baseline Postop Assessment: no apparent nausea or vomiting Anesthetic complications: no   No notable events documented.  Last Vitals:  Vitals:   03/31/21 1000 03/31/21 1012  BP: (!) 142/61 (!) 126/55  Pulse: 72 75  Resp: 13 16  Temp:    SpO2: 96% 94%    Last Pain:  Vitals:   03/31/21 1012  TempSrc:   PainSc: 0-No pain                 Brennan Bailey

## 2021-03-31 NOTE — Discharge Instructions (Signed)
After Your ICD (Implantable Cardiac Defibrillator)   You have a Biotronik ICD  ACTIVITY Do not lift your arm above shoulder height for 1 week after your procedure. After 7 days, you may progress as below.  You should remove your sling 24 hours after your procedure, unless otherwise instructed by your provider.     Thursday April 07, 2021  Friday April 08, 2021 Saturday April 09, 2021 Sunday April 10, 2021   Do not lift, push, pull, or carry anything over 10 pounds with the affected arm until 6 weeks (Thursday May 12, 2021 ) after your procedure.   You may drive AFTER your wound check, unless you have been told otherwise by your provider.   Ask your healthcare provider when you can go back to work   INCISION/Dressing If you are on a blood thinner such as Coumadin, Xarelto, Eliquis, Plavix, or Pradaxa please confirm with your provider when this should be resumed.   If large square, outer bandage is left in place, this can be removed after 24 hours from your procedure. Do not remove steri-strips or glue as below.   Monitor your defibrillator site for redness, swelling, and drainage. Call the device clinic at [phone] if you experience these symptoms or fever/chills.  If your incision is sealed with Steri-strips or staples, you may shower 10 days after your procedure or when told by your provider. Do not remove the steri-strips or let the shower hit directly on your site. You may wash around your site with soap and water.    Avoid lotions, ointments, or perfumes over your incision until it is well-healed.  You may use a hot tub or a pool AFTER your wound check appointment if the incision is completely closed.  Your ICD is designed to protect you from life threatening heart rhythms. Because of this, you may receive a shock.   1 shock with no symptoms:  Call the office during business hours. 1 shock with symptoms (chest pain, chest pressure, dizziness, lightheadedness, shortness of  breath, overall feeling unwell):  Call 911. If you experience 2 or more shocks in 24 hours:  Call 911. If you receive a shock, you should not drive for 6 months per the [place] DMV IF you receive appropriate therapy from your ICD.   ICD Alerts:  Some alerts are vibratory and others beep. These are NOT emergencies. Please call our office to let us know. If this occurs at night or on weekends, it can wait until the next business day. Send a remote transmission.  If your device is capable of reading fluid status (for heart failure), you will be offered monthly monitoring to review this with you.   DEVICE MANAGEMENT Remote monitoring is used to monitor your ICD from home. This monitoring is scheduled every 91 days by our office. It allows [place] to keep an eye on the functioning of your device to ensure it is working properly. You will routinely see your Electrophysiologist annually (more often if necessary).   You should receive your ID card for your new device in 4-8 weeks. Keep this card with you at all times once received. Consider wearing a medical alert bracelet or necklace.  Your ICD  may be MRI compatible. This will be discussed at your next office visit/wound check.  You should avoid contact with strong electric or magnetic fields.   Do not use amateur (ham) radio equipment or electric (arc) welding torches. MP3 player headphones with magnets should not be used. Some devices  are safe to use if held at least 12 inches (30 cm) from your defibrillator. These include power tools, lawn mowers, and speakers. If you are unsure if something is safe to use, ask your health care provider.  When using your cell phone, hold it to the ear that is on the opposite side from the defibrillator. Do not leave your cell phone in a pocket over the defibrillator.  You may safely use electric blankets, heating pads, computers, and microwave ovens.  Call the office right away if: You have chest pain. You feel more than  one shock. You feel more short of breath than you have felt before. You feel more light-headed than you have felt before. Your incision starts to open up.  This information is not intended to replace advice given to you by your health care provider. Make sure you discuss any questions you have with your health care provider.

## 2021-03-31 NOTE — Transfer of Care (Signed)
Immediate Anesthesia Transfer of Care Note  Patient: [name] [name]  Procedure(s) Performed: ICD IMPLANT  Patient Location: Cath Lab  Anesthesia Type:General  Level of Consciousness: awake, alert  and oriented  Airway & Oxygen Therapy: Patient Spontanous Breathing and Patient connected to nasal cannula oxygen  Post-op Assessment: Report given to RN, Post -op Vital signs reviewed and stable and Patient moving all extremities  Post vital signs: Reviewed and stable  Last Vitals:  Vitals Value Taken Time  BP 143/64 03/31/21 0917  Temp 36.6 C 03/31/21 0918  Pulse 78 03/31/21 0920  Resp 13 03/31/21 0920  SpO2 97 % 03/31/21 0920  Vitals shown include unvalidated device data.  Last Pain:  Vitals:   03/31/21 0918  TempSrc: Temporal  PainSc: Asleep         Complications: No notable events documented.

## 2021-04-01 ENCOUNTER — Telehealth: Payer: Self-pay

## 2021-04-01 ENCOUNTER — Encounter (HOSPITAL_COMMUNITY): Payer: Self-pay | Admitting: Cardiology

## 2021-04-01 MED FILL — Lidocaine HCl Local Preservative Free (PF) Inj 1%: INTRAMUSCULAR | Qty: 30 | Status: AC

## 2021-04-01 NOTE — Telephone Encounter (Signed)
Follow-up after same day discharge: Implant date: 03/31/21 MD: Dr. Lars Mage Device: Biotronik ICD Location: Left Chest   Wound check visit: 04/13/21 90 day MD follow-up: 07/06/21/ [place]  Remote Transmission received:Yes  Dressing removed: Not yet, patient educated to remove dressing today and s/s of infection to monitor for and report.

## 2021-04-01 NOTE — Telephone Encounter (Signed)
-----   Message from Shirley Friar, [place] sent at 03/31/2021  2:03 PM EDT ----- Same day d/c Dr. Quentin Ore ICD

## 2021-04-06 ENCOUNTER — Other Ambulatory Visit: Payer: Self-pay | Admitting: Family Medicine

## 2021-04-13 ENCOUNTER — Other Ambulatory Visit: Payer: Self-pay

## 2021-04-13 ENCOUNTER — Ambulatory Visit (INDEPENDENT_AMBULATORY_CARE_PROVIDER_SITE_OTHER): Payer: Medicare Other

## 2021-04-13 DIAGNOSIS — I255 Ischemic cardiomyopathy: Secondary | ICD-10-CM | POA: Diagnosis not present

## 2021-04-13 LAB — CUP PACEART INCLINIC DEVICE CHECK
Battery Voltage: 3.12 V
Brady Statistic RV Percent Paced: 0 %
Date Time Interrogation Session: 20220803094438
HighPow Impedance: 77 Ohm
Implantable Lead Implant Date: 20220721
Implantable Lead Location: 753860
Implantable Lead Model: 436909
Implantable Lead Serial Number: 81466093
Implantable Pulse Generator Implant Date: 20220721
Lead Channel Impedance Value: 679 Ohm
Lead Channel Pacing Threshold Amplitude: 0.5 V
Lead Channel Pacing Threshold Pulse Width: 0.4 ms
Lead Channel Sensing Intrinsic Amplitude: 23.9 mV
Lead Channel Sensing Intrinsic Amplitude: 4 mV
Lead Channel Setting Pacing Amplitude: 3 V
Lead Channel Setting Pacing Pulse Width: 0.4 ms
Lead Channel Setting Sensing Sensitivity: 0.8 mV
Pulse Gen Model: 429525
Pulse Gen Serial Number: 84852229

## 2021-04-13 NOTE — Progress Notes (Signed)
Wound check appointment. Steri-strips removed. Wound without redness or edema. Incision edges approximated, wound well healed. Normal device function. Thresholds, sensing, and impedances consistent with implant measurements. Device programmed at 3.5V for extra safety margin until 3 month visit. Histogram distribution appropriate for patient and level of activity. No mode switches or ventricular arrhythmias noted. Patient educated about wound care, arm mobility, lifting restrictions, shock plan. ROV in 3 months with Dr. Quentin Ore.

## 2021-04-13 NOTE — Patient Instructions (Signed)
   After Your ICD (Implantable Cardiac Defibrillator)    Monitor your defibrillator site for redness, swelling, and drainage. Call the device clinic at [phone] if you experience these symptoms or fever/chills.  Your incision was closed with Steri-strips or staples:  You may shower 7 days after your procedure and wash your incision with soap and water. Avoid lotions, ointments, or perfumes over your incision until it is well-healed.    You may use a hot tub or a pool after your wound check appointment if the incision is completely closed.  Do not lift, push or pull greater than 10 pounds with the affected arm until 6 weeks after your procedure. There are no other restrictions in arm movement after your wound check appointment.  Your ICD is MRI compatible.  Your ICD is designed to protect you from life threatening heart rhythms. Because of this, you may receive a shock.   1 shock with no symptoms:  Call the office during business hours. 1 shock with symptoms (chest pain, chest pressure, dizziness, lightheadedness, shortness of breath, overall feeling unwell):  Call 911. If you experience 2 or more shocks in 24 hours:  Call 911. If you receive a shock, you should not drive.  [place] DMV - no driving for 6 months if you receive appropriate therapy from your ICD.   ICD Alerts:  Some alerts are vibratory and others beep. These are NOT emergencies. Please call our office to let us know. If this occurs at night or on weekends, it can wait until the next business day. Send a remote transmission.  If your device is capable of reading fluid status (for heart failure), you will be offered monthly monitoring to review this with you.   Remote monitoring is used to monitor your ICD from home. This monitoring is scheduled every 91 days by our office. It allows us to keep an eye on the functioning of your device to ensure it is working properly. You will routinely see your Electrophysiologist annually  (more often if necessary).   

## 2021-05-17 ENCOUNTER — Other Ambulatory Visit: Payer: Self-pay

## 2021-05-17 MED ORDER — DAPAGLIFLOZIN PROPANEDIOL 10 MG PO TABS: 10.0000 mg | ORAL_TABLET | Freq: Every day | ORAL | 3 refills | Status: DC

## 2021-05-20 NOTE — Progress Notes (Signed)
Cardiology Office Note    Date:  05/25/2021   ID:  [name] [name], DOB [birth year], MRN [id number]  PCP:  Owens Loffler, MD  Cardiologist:  [name], MD  Electrophysiologist:  Vickie Epley, MD   Chief Complaint: Follow up  History of Present Illness:   [name] [name] is a 70 y.o. 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   Prior LHC in 12/2012 showed nonobstructive disease as outlined below.  Echo at that time showed an EF of 50 to 55%, no regional wall motion abnormalities, grade 1 diastolic dysfunction, normal RV systolic function and PASP.  She was admitted to the hospital in 11/2019 with exertional dyspnea.  High-sensitivity troponin peaked at 415.  Echo demonstrated a new cardiomyopathy with an EF of 25 to 30%, global hypokinesis, mildly dilated LV internal cavity size, mild LVH, grade 1 diastolic dysfunction, normal RV systolic function and ventricular cavity size, mildly dilated left atrium, and an estimated right atrial pressure of 3 mmHg.  R/LHC at that time showed significant two-vessel CAD with diffuse LAD stenosis up to 70 to 80% in the mid/distal vessel and CTO of the mid RCA with left-to-right collaterals.  Moderately elevated left heart, right heart, and pulmonary artery pressures.  Low normal cardiac output/index.  Medical management was advised given small vessel size and diffuse disease involving the LAD and RCA making PCI and surgical revascularization suboptimal.  Repeat echo on GDMT in 02/2020 showed a persistent cardiomyopathy with an EF of 25 to 30%, global hypokinesis, mildly dilated LV internal cavity size, grade 2 diastolic dysfunction, normal RV systolic function and ventricular cavity size, mildly dilated left atrium, mild mitral  regurgitation, and mild to moderate aortic valve sclerosis without evidence of stenosis.  She subsequently underwent repeat LHC in 04/2020 which showed severe two-vessel CAD with diffuse LAD stenosis of up to 70 to 80% that was significant by IVUS, as well as CTO of the RCA.  She underwent successful IVUS guided PCI to the mid/distal LAD using overlapping DES.     She was admitted to the hospital in 07/2020 with acute on chronic combined systolic and diastolic CHF and a large left pleural effusion.  She underwent thoracentesis with 1.4 L of fluid removed with cytology being negative for malignancy.  Echo in 07/2020, again showed a persistent cardiomyopathy with an EF of 25 to 30%, global hypokinesis with severe hypokinesis of the anteroseptal wall and basal anterior wall, mildly dilated LV internal cavity size, normal RV systolic function and ventricular cavity size.   She was seen in the office in 11/2020 and continued to feel well.  Her weight was up 11 pounds by our scale, which she attributed to caloric intake.  She did note occasional orthostatic lightheadedness.  Carvedilol was titrated to 12.5 mg twice daily.  Otherwise, she was continued on Entresto, Farxiga, spironolactone, and furosemide.  She underwent repeat echo on 02/01/2021, on maximally tolerated GDMT, which showed a slight improvement in her LVSF with an EF of 30 to 35%, global hypokinesis, moderately to severely dilated LV internal cavity size, grade 1 diastolic dysfunction, trivial pericardial effusion, and akinesis of the basal mid inferior wall.   She was evaluated by EP on 03/23/2021 with recommendation to proceed with ICD for primary prevention.  She subsequently underwent implantation of Biotronik  ICD on 03/31/2021.  She comes in doing well from a cardiac perspective.  No chest pain, palpitations, dizziness, presyncope, or syncope.  No shocks or alarms from her device.  No issues from her device implantation site.  She continues to be  active at baseline exercising on her [place].  With this, her endurance continues to improve.  She is tolerating cardiac medications without issues.      Labs independently reviewed: 03/2021 - BUN 15, serum creatinine 0.67, potassium 4.7, Hgb 15.3, PLT 313 08/2020 - direct LDL 50, TC 187, TG 262, HDL 46, TSH normal, albumin 4.5, AST/ALT normal 07/2020 - magnesium 2.4   Past Medical History:  Diagnosis Date   Allergic rhinitis    Breast cancer ([place]) 2004   Left Breast Cancer   CHF (congestive heart failure) ([place])    Coronary artery disease    a. 10/2012 ETT: nl; b. 12/2012 Cath: LM nl, LAD min irregs, D1 min irregs, LCX 30p, RCA 36m RPDA 40p, EF 50%.   Diastolic dysfunction    a. 12/2012 Echo: EF 50-55%. No rwma. Gr1 DD. Nl RV fxn. Nl PASP.   History of hepatitis B    ?--tested + by [facility]   HLD (hyperlipidemia)    HTN (hypertension)    Hyperlipidemia associated with type 2 diabetes mellitus [place] 12/04/2020   Osteoarthritis    Osteopenia     Past Surgical History:  Procedure Laterality Date   ABDOMINAL HYSTERECTOMY  1985   endometriosis; anatomy   BREAST SURGERY  10/2002   CARDIAC CATHETERIZATION  12/19/12   [place] 50%   COLONOSCOPY  8/09   Hyperplastic polyp; repeat in 10 years   CORONARY STENT INTERVENTION N/A 05/03/2020   Procedure: CORONARY STENT INTERVENTION;  Surgeon: ENelva Bush MD;  [facility];  Service: Cardiovascular;  Laterality: N/A;   HIP FRACTURE SURGERY  06/14/2011   ball replaced (Dr. REarvin Hansen   ICD IMPLANT N/A 03/31/2021   Procedure: ICD IMPLANT;  Surgeon: LVickie Epley MD;  [facility];  Service: Cardiovascular;  Laterality: N/A;   INTRAVASCULAR ULTRASOUND/IVUS N/A 05/03/2020   Procedure: Intravascular Ultrasound/IVUS;  Surgeon: ENelva Bush MD;  [facility];  Service: Cardiovascular;  Laterality: N/A;   LEFT HEART CATH N/A 05/03/2020   Procedure: Left Heart Cath;  Surgeon: ENelva Bush  MD;  [facility]: [facility];  Service: Cardiovascular;  Laterality: N/A;   MASTECTOMY Left 2004   RIGHT/LEFT HEART CATH AND CORONARY ANGIOGRAPHY N/A 12/02/2019   Procedure: RIGHT/LEFT HEART CATH AND CORONARY ANGIOGRAPHY;  Surgeon: ENelva Bush MD;  [facility];  Service: Cardiovascular;  Laterality: N/A;   TONSILLECTOMY  1958    Current Medications: Current Meds  Medication Sig   acetaminophen (TYLENOL) 650 MG CR tablet Take 1,300 mg by mouth every 8 (eight) hours as needed for pain.   bismuth subsalicylate (PEPTO BISMOL) 262 MG/15ML suspension Take 30 mLs by mouth every 6 (six) hours as needed for indigestion or diarrhea or loose stools.   Calcium Carbonate-Vitamin D 600-400 MG-UNIT tablet Take 1 tablet by mouth daily.    carvedilol (COREG) 12.5 MG tablet Take 1 tablet (12.5 mg total) by mouth 2 (two) times daily with a meal.   cetirizine (ZYRTEC) 10 MG tablet Take 10 mg by mouth daily as needed for allergies.   clopidogrel (PLAVIX) 75 MG tablet Take 1 tablet (75 mg total) by mouth daily.   dapagliflozin propanediol (FARXIGA) 10 MG TABS tablet  Take 1 tablet (10 mg total) by mouth daily.   FLUoxetine (PROZAC) 20 MG capsule Take 1 capsule by mouth once daily   furosemide (LASIX) 40 MG tablet Take 0.5 tablets (20 mg total) by mouth daily.   Glycerin-Hypromellose-PEG 400 (DRY EYE RELIEF DROPS) 0.2-0.2-1 % SOLN Place 1 drop into both eyes daily as needed (Dry eye).   Grape Seed Extract 60 MG CAPS Take 60 mg by mouth 2 (two) times daily.   Misc Natural Products (OSTEO BI-FLEX ADV JOINT SHIELD) TABS Take 1 tablet by mouth 2 (two) times daily.   Multiple Vitamin (MULTIVITAMIN WITH MINERALS) TABS tablet Take 1 tablet by mouth daily.   Multiple Vitamins-Minerals (EYE VITAMINS) CAPS Take 1 capsule by mouth daily. Vision Shield   pseudoephedrine-acetaminophen (TYLENOL SINUS) 30-500 MG TABS tablet Take 2 tablets by mouth every 4 (four) hours as needed (Sinus).   rosuvastatin  (CRESTOR) 10 MG tablet Take 1 tablet (10 mg total) by mouth daily.   sacubitril-valsartan (ENTRESTO) 49-51 MG Take 1 tablet by mouth 2 (two) times daily.   spironolactone (ALDACTONE) 25 MG tablet Take 1 tablet (25 mg total) by mouth daily.    Allergies:   Codeine, Elemental sulfur, Morphine, Ace inhibitors, and Sulfonamide derivatives   Social History   Socioeconomic History   Marital status: Widowed    Spouse name: (Widow) to Richardson Landry   Number of children: Not on file   Years of education: Not on file   Highest education level: Not on file  Occupational History   Not on file  Tobacco Use   Smoking status: Former    Packs/day: 0.50    Years: 10.00    Pack years: 5.00    Types: Cigarettes   Smokeless tobacco: Former    Quit date: 01/24/1986   Tobacco comments:    1989 quit   Vaping Use   Vaping Use: Never used  Substance and Sexual Activity   Alcohol use: No   Drug use: No   Sexual activity: Not on file  Other Topics Concern   Not on file  Social History Narrative   Widowed to husband Renato Gails      No regular exercise   Lives locally w/ tenants that help her w/ chores around the house.   Social Determinants of Health   Financial Resource Strain: Low Risk    Difficulty of Paying Living Expenses: Not hard at all  Food Insecurity: No Food Insecurity   Worried About Charity fundraiser in the Last Year: Never true   [place] in the Last Year: Never true  Transportation Needs: No Transportation Needs   Lack of Transportation (Medical): No   Lack of Transportation (Non-Medical): No  Physical Activity: Inactive   Days of Exercise per Week: 0 days   Minutes of Exercise per Session: 0 min  Stress: No Stress Concern Present   Feeling of Stress : Not at all  Social Connections: Moderately Isolated   Frequency of Communication with Friends and Family: Twice a week   Frequency of Social Gatherings with Friends and Family: Twice a week   Attends Religious  Services: More than 4 times per year   Active Member of [employer] or Organizations: No   Attends Archivist Meetings: Never   Marital Status: Widowed     Family History:  The patient's family history includes Breast cancer (age of onset: 71) in her sister; Cancer in her paternal aunt and sister; Diabetes in an  other family member; Heart attack (age of onset: 35) in her father; Heart disease in her father; Ovarian cancer in an other family member; Sarcoidosis in her mother.  ROS:   Review of Systems  Constitutional:  Negative for chills, diaphoresis, fever, malaise/fatigue and weight loss.  HENT:  Negative for congestion.   Eyes:  Negative for discharge and redness.  Respiratory:  Negative for cough, sputum production, shortness of breath and wheezing.   Cardiovascular:  Negative for chest pain, palpitations, orthopnea, claudication, leg swelling and PND.  Gastrointestinal:  Negative for abdominal pain, heartburn, nausea and vomiting.  Musculoskeletal:  Negative for falls and myalgias.  Skin:  Negative for rash.  Neurological:  Negative for dizziness, tingling, tremors, sensory change, speech change, focal weakness, loss of consciousness and weakness.  Endo/Heme/Allergies:  Does not bruise/bleed easily.  Psychiatric/Behavioral:  Negative for substance abuse. The patient is not nervous/anxious.     EKGs/Labs/Other Studies Reviewed:    Studies reviewed were summarized above. The additional studies were reviewed today:  2D echo 02/01/2021: 1. Left ventricular ejection fraction, by estimation, is 30 to 35%. The  left ventricle has moderately decreased function. The left ventricle  demonstrates global hypokinesis. The left ventricular internal cavity size  was moderately to severely dilated.  Left ventricular diastolic parameters are consistent with Grade I  diastolic dysfunction (impaired relaxation). There is akinesis of the left  ventricular, basal-mid inferior wall.   __________   2D echo 07/16/2020: 1. Left ventricular ejection fraction, by estimation, is 25 to 30%. The  left ventricle has severely decreased function. The left ventricle  demonstrates global hypokinesis with severe hypokinesis of the  anterospetal wall and basal anterior wall. The left   ventricular internal cavity size was mildly dilated.   2. Right ventricular systolic function is normal. The right ventricular  size is normal. Tricuspid regurgitation signal is inadequate for assessing  PA pressure.  __________   LHC 05/03/2020: Conclusions: Severe two-vessel coronary artery disease with diffuse LAD disease of up to 70-80% that is significant by IVUS, as well as CTO of RCA. Upper normal LVEDP. Successful IVUS-guided PCI to mid/distal LAD using overlapping Resolute Onyx 2.5 x 38 mm and 2.25 x 34 mm drug-eluting stents with 0% residual stenosis and TIMI-3 flow.   Recommendations: Overnight extended recovery given multivessel CAD and LVEF < 35%. Obtain CXR and UA/urine culture for further evaluation of leukocytosis. Dual antiplatelet therapy with ASA and clopidogrel for at lest 6 months, ideally longer. Recheck lipid panel in AM and escalate statin therapy, as tolerated, if LDL > 70. __________   Cardiac MRI 04/17/2020: 1. Severe left ventricular enlargement with severely reduced LV function, LVEF 33%. Akinesis of inferior and inferolateral walls. 2. Post-contrast delayed myocardial enhancement in the inferior and inferolateral walls, with greater than 50% of the myocardial thickness involved suggesting no viability in the RCA territory. 3.  Normal right ventricular size and systolic function. __________   2D echo 03/01/2020: 1. Left ventricular ejection fraction, by estimation, is 25 to 30%. The  left ventricle has severely decreased function. The left ventricle  demonstrates global hypokinesis. The left ventricular internal cavity size  was mildly dilated. Left ventricular   diastolic parameters are consistent with Grade II diastolic dysfunction  (pseudonormalization).   2. Right ventricular systolic function is normal. The right ventricular  size is normal. Tricuspid regurgitation signal is inadequate for assessing  PA pressure.   3. Left atrial size was mildly dilated.   4. The mitral valve is normal in  structure. Mild mitral valve  regurgitation. No evidence of mitral stenosis.   5. The aortic valve is normal in structure. Aortic valve regurgitation is  not visualized. Mild to moderate aortic valve sclerosis/calcification is  present, without any evidence of aortic stenosis.   Comparison(s): Previous Echo showed new finding of LV EF 25-30% with  severely decreased function, global HK, and grade I diastolic dysfunction.  __________   [facility] 12/02/2019: Conclusions: Significant two-vessel coronary artery disease with diffuse LAD disease of up to 70-80% in the mid/distal vessel and chronic subtotal occlusion of the mid RCA with left-to-right collaterals. Moderately elevated left heart, right heart, and pulmonary artery pressures. Low normal Fick cardiac output/index.   Recommendations: Escalate diuresis and optimize evidence-based heart failure therapy. Medical management of coronary artery disease.  Small vessel size and diffuse disease involving LAD and RCA make percutaneous and surgical revascularization suboptimal. __________   2D echo 11/29/2019: 1. Left ventricular ejection fraction, by estimation, is 25 to 30%. The  left ventricle has severely decreased function. The left ventricle  demonstrates global hypokinesis. The left ventricular internal cavity size  was mildly dilated. There is mild left  ventricular hypertrophy. Left ventricular diastolic parameters are  consistent with Grade I diastolic dysfunction (impaired relaxation).   2. Right ventricular systolic function is normal. The right ventricular  size is normal. Tricuspid regurgitation  signal is inadequate for assessing  PA pressure.   3. Left atrial size was mildly dilated.   4. The inferior vena cava is normal in size with greater than 50%  respiratory variability, suggesting right atrial pressure of 3 mmHg. __________   [facility] 12/19/2012: Left main normal, LAD minor luminal irregularities, proximal LCx 30% stenosis, mid RCA 50% stenosis, proximal RPDA 40% stenosis, LVEF estimated 50%.  Medical therapy recommended. __________   2D echo 12/10/2012: - Left ventricle: The cavity size was normal. Systolic    function was normal. The estimated ejection fraction was    in the range of 50% to 55%. Wall motion was normal; there    were no regional wall motion abnormalities. Doppler    parameters are consistent with abnormal left ventricular    relaxation (grade 1 diastolic dysfunction).  - Left atrium: The atrium was normal in size.  - Right ventricle: Systolic function was normal.  - Pulmonary arteries: Systolic pressure was within the    normal range.     EKG:  EKG is ordered today.  The EKG ordered today demonstrates NSR, 86 bpm, incomplete LBBB, LVH with early repolarization abnormality, no acute ST-T changes, no significant change when compared to prior to  Recent Labs: 07/17/2020: Magnesium 2.4 08/26/2020: ALT 17; TSH 1.79 03/23/2021: BUN 15; Creatinine, Ser 0.67; Hemoglobin 15.3; Platelets 313; Potassium 4.7; Sodium 139  Recent Lipid Panel    Component Value Date/Time   CHOL 187 08/26/2020 1538   TRIG 262.0 (H) 08/26/2020 1538   HDL 46.60 08/26/2020 1538   CHOLHDL 4 08/26/2020 1538   VLDL 52.4 (H) 08/26/2020 1538   LDLCALC 48 05/04/2020 0812   LDLDIRECT 50.0 08/26/2020 1538    PHYSICAL EXAM:    VS:  BP 114/70 (BP Location: Left Arm, Patient Position: Sitting, Cuff Size: Normal)   Pulse 86   Ht '5\' 4"'$  (1.626 m)   Wt 207 lb (93.9 kg)   SpO2 96%   BMI 35.53 kg/m   BMI: Body mass index is 35.53 kg/m.  Physical Exam Vitals reviewed.  Constitutional:       Appearance: She is well-developed.  HENT:     Head: Normocephalic and atraumatic.  Eyes:     General:        Right eye: No discharge.        Left eye: No discharge.  Neck:     Vascular: No JVD.  Cardiovascular:     Rate and Rhythm: Normal rate and regular rhythm.     Heart sounds: Normal heart sounds, S1 normal and S2 normal. Heart sounds not distant. No midsystolic click and no opening snap. No murmur heard.   No friction rub.     Comments: Well healing ICD implantation surgical incision site without signs of infection, bleeding, bruising, swelling, warmth, erythema, or TTP.  Pulmonary:     Effort: Pulmonary effort is normal. No respiratory distress.     Breath sounds: Normal breath sounds. No decreased breath sounds, wheezing or rales.  Chest:     Chest wall: No tenderness.  Abdominal:     General: There is no distension.     Palpations: Abdomen is soft.     Tenderness: There is no abdominal tenderness.  Musculoskeletal:     Cervical back: Normal range of motion.     Right lower leg: No edema.     Left lower leg: No edema.  Skin:    General: Skin is warm and dry.     Nails: There is no clubbing.  Neurological:     Mental Status: She is alert and oriented to person, place, and time.  Psychiatric:        Speech: Speech normal.        Behavior: Behavior normal.        Thought Content: Thought content normal.        Judgment: Judgment normal.    Wt Readings from Last 3 Encounters:  05/25/21 207 lb (93.9 kg)  03/31/21 209 lb 1.6 oz (94.8 kg)  03/23/21 209 lb (94.8 kg)     ASSESSMENT & PLAN:   CAD involving the native coronary arteries without angina: She is doing well without symptoms concerning for angina.  Continue current medical therapy including clopidogrel, carvedilol, and rosuvastatin.  No indication for ischemic testing at this time.  Chronic combined systolic and diastolic CHF secondary to ICM status post Biotronik ICD for primary prevention: She appears  euvolemic and well compensated with NYHA class II symptoms.  Her weight is down 2 pounds today when compared to last clinic visit.  Continue GDMT including carvedilol, Farxiga, furosemide, Entresto, and spironolactone.  BP precludes further escalation of evidence-based medical therapy at this time.  She will follow-up with EP as directed regarding her device.  HTN: Blood pressure is well controlled in the office today.  Continue current medical therapy as above.  HLD: LDL 50 in 08/2020.  She remains on rosuvastatin.  Disposition: F/u with Dr. Saunders Revel or an APP in 4 months, and EP as directed.   Medication Adjustments/Labs and Tests Ordered: Current medicines are reviewed at length with the patient today.  Concerns regarding medicines are outlined above. Medication changes, Labs and Tests ordered today are summarized above and listed in the Patient Instructions accessible in Encounters.   Signed, Christell Faith, PA-C 05/25/2021 1:02 PM     [address] [place], [place] [address] [phone]

## 2021-05-25 ENCOUNTER — Other Ambulatory Visit: Payer: Self-pay

## 2021-05-25 ENCOUNTER — Telehealth: Payer: Self-pay

## 2021-05-25 ENCOUNTER — Encounter: Payer: Self-pay | Admitting: Physician Assistant

## 2021-05-25 ENCOUNTER — Ambulatory Visit (INDEPENDENT_AMBULATORY_CARE_PROVIDER_SITE_OTHER): Payer: Medicare Other | Admitting: Physician Assistant

## 2021-05-25 VITALS — BP 114/70 | HR 86 | Ht 64.0 in | Wt 207.0 lb

## 2021-05-25 DIAGNOSIS — I255 Ischemic cardiomyopathy: Secondary | ICD-10-CM | POA: Diagnosis not present

## 2021-05-25 DIAGNOSIS — I2510 Atherosclerotic heart disease of native coronary artery without angina pectoris: Secondary | ICD-10-CM

## 2021-05-25 DIAGNOSIS — I5042 Chronic combined systolic (congestive) and diastolic (congestive) heart failure: Secondary | ICD-10-CM | POA: Diagnosis not present

## 2021-05-25 DIAGNOSIS — I10 Essential (primary) hypertension: Secondary | ICD-10-CM | POA: Diagnosis not present

## 2021-05-25 DIAGNOSIS — E785 Hyperlipidemia, unspecified: Secondary | ICD-10-CM

## 2021-05-25 NOTE — Patient Instructions (Signed)
Medication Instructions:  - Your physician recommends that you continue on your current medications as directed. Please refer to the Current Medication list given to you today.  *If you need a refill on your cardiac medications before your next appointment, please call your pharmacy*   Lab Work: - none ordered  If you have labs (blood work) drawn today and your tests are completely normal, you will receive your results only by: [place] (if you have MyChart) OR A paper copy in the mail If you have any lab test that is abnormal or we need to change your treatment, we will call you to review the results.   Testing/Procedures: - none ordered   Follow-Up: At [facility], you and your health needs are our priority.  As part of our continuing mission to provide you with exceptional heart care, we have created designated Provider Care Teams.  These Care Teams include your primary Cardiologist (physician) and Advanced Practice Providers (APPs -  Physician Assistants and Nurse Practitioners) who all work together to provide you with the care you need, when you need it.  We recommend signing up for the patient portal called "MyChart".  Sign up information is provided on this After Visit Summary.  MyChart is used to connect with patients for Virtual Visits (Telemedicine).  Patients are able to view lab/test results, encounter notes, upcoming appointments, etc.  Non-urgent messages can be sent to your provider as well.   To learn more about what you can do with MyChart, go to [email or web address].    Your next appointment:   4 month(s)  The format for your next appointment:   In Person  Provider:   You may see [name], MD or one of the following Advanced Practice Providers on your designated Care Team:    Christell Faith, PA-C    Other Instructions N/a

## 2021-05-25 NOTE — Telephone Encounter (Signed)
Called patient.  No answer. LMOV to call back.   Need to make her aware I have found the [employer] application for [employer] and need to see if she would like me to mail it to her or if she would like to come by the office and pick it up.

## 2021-05-26 NOTE — Telephone Encounter (Signed)
Patient called back.  She stated she would like me to mail the patient assistance application too her.  Verified address on file.   Made her aware that I have highlighted everything that she needed too fill out.  Made her aware to bring the form filled out and I would fax to the company for her.

## 2021-06-03 ENCOUNTER — Other Ambulatory Visit: Payer: Self-pay | Admitting: Internal Medicine

## 2021-06-11 ENCOUNTER — Other Ambulatory Visit: Payer: Self-pay | Admitting: Internal Medicine

## 2021-06-13 NOTE — Telephone Encounter (Signed)
Please advise if ok to refill. 

## 2021-06-26 ENCOUNTER — Other Ambulatory Visit: Payer: Self-pay | Admitting: Student

## 2021-06-29 LAB — CUP PACEART REMOTE DEVICE CHECK
Battery Voltage: 3.12 V
Date Time Interrogation Session: 20221019092352
HighPow Impedance: 85 Ohm
Implantable Lead Implant Date: 20220721
Implantable Lead Location: 753860
Implantable Lead Model: 436909
Implantable Lead Serial Number: 81466093
Implantable Pulse Generator Implant Date: 20220721
Lead Channel Impedance Value: 1011 Ohm
Lead Channel Pacing Threshold Amplitude: 0.4 V
Lead Channel Pacing Threshold Pulse Width: 0.4 ms
Lead Channel Sensing Intrinsic Amplitude: 20 mV
Lead Channel Sensing Intrinsic Amplitude: 4.1 mV
Lead Channel Setting Pacing Amplitude: 3 V
Lead Channel Setting Pacing Pulse Width: 0.4 ms
Lead Channel Setting Sensing Sensitivity: 0.8 mV
Pulse Gen Model: 429525
Pulse Gen Serial Number: 84852229

## 2021-06-30 ENCOUNTER — Ambulatory Visit (INDEPENDENT_AMBULATORY_CARE_PROVIDER_SITE_OTHER): Payer: Medicare Other

## 2021-06-30 DIAGNOSIS — I255 Ischemic cardiomyopathy: Secondary | ICD-10-CM | POA: Diagnosis not present

## 2021-07-06 ENCOUNTER — Encounter: Payer: Medicare Other | Admitting: Cardiology

## 2021-07-08 NOTE — Progress Notes (Signed)
Remote ICD transmission.   

## 2021-07-12 NOTE — Progress Notes (Signed)
Electrophysiology Office Follow up Visit Note:    Date:  07/13/2021   ID:  [name] [name], DOB 01/14/1951, MRN [id number]  PCP:  Owens Loffler, MD  [facility] Cardiologist:  [name], MD  [facility] Electrophysiologist:  Vickie Epley, MD    Interval History:    [name] [name] is a 70 y.o. female who presents for a follow up visit after Biotronik ICD implant 03/31/2021.   She has done well since implant without delivered ICD therapies. Her incision is healed well.    Past Medical History:  Diagnosis Date   Allergic rhinitis    Breast cancer ([place]) 2004   Left Breast Cancer   CHF (congestive heart failure) ([place])    Coronary artery disease    a. 10/2012 ETT: nl; b. 12/2012 Cath: LM nl, LAD min irregs, D1 min irregs, LCX 30p, RCA 46m, RPDA 40p, EF 50%.   Diastolic dysfunction    a. 12/2012 Echo: EF 50-55%. No rwma. Gr1 DD. Nl RV fxn. Nl PASP.   History of hepatitis B    ?--tested + by [facility]   HLD (hyperlipidemia)    HTN (hypertension)    Hyperlipidemia associated with type 2 diabetes mellitus ([place]) 12/04/2020   Osteoarthritis    Osteopenia     Past Surgical History:  Procedure Laterality Date   ABDOMINAL HYSTERECTOMY  1985   endometriosis; anatomy   BREAST SURGERY  10/2002   CARDIAC CATHETERIZATION  12/19/12   [place]; EF 50%   COLONOSCOPY  8/09   Hyperplastic polyp; repeat in 10 years   CORONARY STENT INTERVENTION N/A 05/03/2020   Procedure: CORONARY STENT INTERVENTION;  Surgeon: [name], MD;  Location: [place] [facility];  Service: Cardiovascular;  Laterality: N/A;   HIP FRACTURE SURGERY  06/14/2011   ball replaced (Dr. Earvin Hansen)   ICD IMPLANT N/A 03/31/2021   Procedure: ICD IMPLANT;  Surgeon: Vickie Epley, MD;  Location: [place] [facility];  Service: Cardiovascular;  Laterality: N/A;   INTRAVASCULAR ULTRASOUND/IVUS N/A 05/03/2020   Procedure: Intravascular Ultrasound/IVUS;  Surgeon: [name], MD;  Location: [place] [facility];   Service: Cardiovascular;  Laterality: N/A;   LEFT HEART CATH N/A 05/03/2020   Procedure: Left Heart Cath;  Surgeon: [name], MD;  Location: [place] [facility];  Service: Cardiovascular;  Laterality: N/A;   MASTECTOMY Left 2004   RIGHT/LEFT HEART CATH AND CORONARY ANGIOGRAPHY N/A 12/02/2019   Procedure: RIGHT/LEFT HEART CATH AND CORONARY ANGIOGRAPHY;  Surgeon: [name], MD;  Location: [place] [facility];  Service: Cardiovascular;  Laterality: N/A;   TONSILLECTOMY  1958    Current Medications: Current Meds  Medication Sig   acetaminophen (TYLENOL) 650 MG CR tablet Take 1,300 mg by mouth every 8 (eight) hours as needed for pain.   bismuth subsalicylate (PEPTO BISMOL) 262 MG/15ML suspension Take 30 mLs by mouth every 6 (six) hours as needed for indigestion or diarrhea or loose stools.   Calcium Carbonate-Vitamin D 600-400 MG-UNIT tablet Take 1 tablet by mouth daily.    carvedilol (COREG) 12.5 MG tablet TAKE 1 TABLET (12.5 MG TOTAL) BY MOUTH 2 (TWO) TIMES DAILY WITH A MEAL. DOSAGE INCREASE   cetirizine (ZYRTEC) 10 MG tablet Take 10 mg by mouth daily as needed for allergies.   clopidogrel (PLAVIX) 75 MG tablet TAKE 1 TABLET BY MOUTH EVERY DAY   dapagliflozin propanediol (FARXIGA) 10 MG TABS tablet Take 1 tablet (10 mg total) by mouth daily.   ENTRESTO 49-51 MG TAKE 1 TABLET  BY MOUTH TWICE A DAY   FLUoxetine (PROZAC) 20 MG capsule Take 1 capsule by mouth once daily   furosemide (LASIX) 40 MG tablet Take 0.5 tablets (20 mg total) by mouth daily.   Glycerin-Hypromellose-PEG 400 (DRY EYE RELIEF DROPS) 0.2-0.2-1 % SOLN Place 1 drop into both eyes daily as needed (Dry eye).   Grape Seed Extract 60 MG CAPS Take 60 mg by mouth 2 (two) times daily.   Misc Natural Products (OSTEO BI-FLEX ADV JOINT SHIELD) TABS Take 1 tablet by mouth 2 (two) times daily.   Multiple Vitamin (MULTIVITAMIN WITH MINERALS) TABS tablet Take 1 tablet by mouth daily.   Multiple Vitamins-Minerals (EYE VITAMINS) CAPS  Take 1 capsule by mouth daily. Vision Shield   pseudoephedrine-acetaminophen (TYLENOL SINUS) 30-500 MG TABS tablet Take 2 tablets by mouth every 4 (four) hours as needed (Sinus).   rosuvastatin (CRESTOR) 10 MG tablet Take 1 tablet (10 mg total) by mouth daily.   spironolactone (ALDACTONE) 25 MG tablet Take 1 tablet (25 mg total) by mouth daily.     Allergies:   Codeine, Elemental sulfur, Morphine, Ace inhibitors, and Sulfonamide derivatives   Social History   Socioeconomic History   Marital status: Widowed    Spouse name: (Widow) to Richardson [name]   Number of children: Not on file   Years of education: Not on file   Highest education level: Not on file  Occupational History   Not on file  Tobacco Use   Smoking status: Former    Packs/day: 0.50    Years: 10.00    Pack years: 5.00    Types: Cigarettes   Smokeless tobacco: Former    Quit date: 01/24/1986   Tobacco comments:    1989 quit   Vaping Use   Vaping Use: Never used  Substance and Sexual Activity   Alcohol use: No   Drug use: No   Sexual activity: Not on file  Other Topics Concern   Not on file  Social History Narrative   Widowed to husband Renato Gails      No regular exercise   Lives locally w/ tenants that help her w/ chores around the house.   Social Determinants of Health   Financial Resource Strain: Low Risk    Difficulty of Paying Living Expenses: Not hard at all  Food Insecurity: No Food Insecurity   Worried About Charity fundraiser in the Last Year: Never true   [place] in the Last Year: Never true  Transportation Needs: No Transportation Needs   Lack of Transportation (Medical): No   Lack of Transportation (Non-Medical): No  Physical Activity: Inactive   Days of Exercise per Week: 0 days   Minutes of Exercise per Session: 0 min  Stress: No Stress Concern Present   Feeling of Stress : Not at all  Social Connections: Moderately Isolated   Frequency of Communication with Friends and Family:  Twice a week   Frequency of Social Gatherings with Friends and Family: Twice a week   Attends Religious Services: More than 4 times per year   Active Member of [employer] or Organizations: No   Attends Archivist Meetings: Never   Marital Status: Widowed     Family History: The patient's family history includes Breast cancer (age of onset: 6) in her sister; Cancer in her paternal aunt and sister; Diabetes in an other family member; Heart attack (age of onset: 44) in her father; Heart disease in her father; Ovarian cancer  in an other family member; Sarcoidosis in her mother.  ROS:   Please see the history of present illness.    All other systems reviewed and are negative.  EKGs/Labs/Other Studies Reviewed:    The following studies were reviewed today:  November 2 in clinic device interrogation performed by myself Atrial sensing 10.4 mV Ventricular sensing 24.2 mV Pacing threshold 0.3 V Pacing impedance 1050 ohms Shock impedance 86 ohms 0% ventricular pacing 0% atrial fibrillation burden 3.12 V battery Report saved to be imported  EKG:  The ekg ordered today demonstrates sinus rhythm with a ventricular rate of 78 bpm.  LVH.  Recent Labs: 07/17/2020: Magnesium 2.4 08/26/2020: ALT 17; TSH 1.79 03/23/2021: BUN 15; Creatinine, Ser 0.67; Hemoglobin 15.3; Platelets 313; Potassium 4.7; Sodium 139  Recent Lipid Panel    Component Value Date/Time   CHOL 187 08/26/2020 1538   TRIG 262.0 (H) 08/26/2020 1538   HDL 46.60 08/26/2020 1538   CHOLHDL 4 08/26/2020 1538   VLDL 52.4 (H) 08/26/2020 1538   LDLCALC 48 05/04/2020 0812   LDLDIRECT 50.0 08/26/2020 1538    Physical Exam:    VS:  BP 102/78 (BP Location: Left Arm, Patient Position: Sitting, Cuff Size: Large)   Pulse 78   Ht 5\' 4"  (1.626 m)   Wt 208 lb (94.3 kg)   SpO2 94%   BMI 35.70 kg/m     Wt Readings from Last 3 Encounters:  07/13/21 208 lb (94.3 kg)  05/25/21 207 lb (93.9 kg)  03/31/21 209 lb 1.6 oz (94.8 kg)      GEN:  Well nourished, well developed in no acute distress HEENT: Normal NECK: No JVD; No carotid bruits LYMPHATICS: No lymphadenopathy CARDIAC: RRR, no murmurs, rubs, gallops.  ICD pocket well-healed on the left chest. RESPIRATORY:  Clear to auscultation without rales, wheezing or rhonchi  ABDOMEN: Soft, non-tender, non-distended MUSCULOSKELETAL:  No edema; No deformity  SKIN: Warm and dry NEUROLOGIC:  Alert and oriented x 3 PSYCHIATRIC:  Normal affect        ASSESSMENT:    1. Chronic combined systolic and diastolic CHF, NYHA class 2 ([place])   2. ICD (implantable cardioverter-defibrillator) in place    PLAN:    In order of problems listed above:   #Chronic combined systolic and diastolic heart failure NYHA class II today.  Warm and dry on exam.  Continue current medical therapy.  #ICD in situ Device functioning well.  Continue remote monitoring.  Follow-up 9 months.      Medication Adjustments/Labs and Tests Ordered: Current medicines are reviewed at length with the patient today.  Concerns regarding medicines are outlined above.  Orders Placed This Encounter  Procedures   EKG 12-Lead    No orders of the defined types were placed in this encounter.    Signed, Lars Mage, MD, [facility], [facility] 07/13/2021 3:12 PM    Electrophysiology [facility]

## 2021-07-13 ENCOUNTER — Encounter: Payer: Self-pay | Admitting: Cardiology

## 2021-07-13 ENCOUNTER — Ambulatory Visit (INDEPENDENT_AMBULATORY_CARE_PROVIDER_SITE_OTHER): Payer: Medicare Other | Admitting: Cardiology

## 2021-07-13 ENCOUNTER — Other Ambulatory Visit: Payer: Self-pay

## 2021-07-13 VITALS — BP 102/78 | HR 78 | Ht 64.0 in | Wt 208.0 lb

## 2021-07-13 DIAGNOSIS — I5042 Chronic combined systolic (congestive) and diastolic (congestive) heart failure: Secondary | ICD-10-CM

## 2021-07-13 DIAGNOSIS — Z95810 Presence of automatic (implantable) cardiac defibrillator: Secondary | ICD-10-CM | POA: Diagnosis not present

## 2021-07-13 DIAGNOSIS — I2510 Atherosclerotic heart disease of native coronary artery without angina pectoris: Secondary | ICD-10-CM

## 2021-07-13 DIAGNOSIS — I5022 Chronic systolic (congestive) heart failure: Secondary | ICD-10-CM | POA: Diagnosis not present

## 2021-07-13 NOTE — Patient Instructions (Signed)
Medication Instructions:  Your physician recommends that you continue on your current medications as directed. Please refer to the Current Medication list given to you today. *If you need a refill on your cardiac medications before your next appointment, please call your pharmacy*  Lab Work: None ordered. If you have labs (blood work) drawn today and your tests are completely normal, you will receive your results only by: [place] (if you have MyChart) OR A paper copy in the mail If you have any lab test that is abnormal or we need to change your treatment, we will call you to review the results.  Testing/Procedures: None ordered.  Follow-Up: At [facility], you and your health needs are our priority.  As part of our continuing mission to provide you with exceptional heart care, we have created designated Provider Care Teams.  These Care Teams include your primary Cardiologist (physician) and Advanced Practice Providers (APPs -  Physician Assistants and Nurse Practitioners) who all work together to provide you with the care you need, when you need it.  Your next appointment:  PHYS DEFIB Your physician wants you to follow-up in: 9 months with Dr. Quentin Ore.  You will receive a reminder letter in the mail two months in advance. If you don't receive a letter, please call our office to schedule the follow-up appointment.  Remote monitoring is used to monitor your ICD from home. This monitoring reduces the number of office visits required to check your device to one time per year. It allows [place] to keep an eye on the functioning of your device to ensure it is working properly. You are scheduled for a device check from home on 09/29/2021. You may send your transmission at any time that day. If you have a wireless device, the transmission will be sent automatically. After your physician reviews your transmission, you will receive a postcard with your next transmission date.

## 2021-08-31 DIAGNOSIS — H25813 Combined forms of age-related cataract, bilateral: Secondary | ICD-10-CM | POA: Diagnosis not present

## 2021-08-31 DIAGNOSIS — H353132 Nonexudative age-related macular degeneration, bilateral, intermediate dry stage: Secondary | ICD-10-CM | POA: Diagnosis not present

## 2021-08-31 DIAGNOSIS — H40013 Open angle with borderline findings, low risk, bilateral: Secondary | ICD-10-CM | POA: Diagnosis not present

## 2021-08-31 DIAGNOSIS — D23121 Other benign neoplasm of skin of left upper eyelid, including canthus: Secondary | ICD-10-CM | POA: Diagnosis not present

## 2021-09-14 ENCOUNTER — Other Ambulatory Visit: Payer: Self-pay | Admitting: Family Medicine

## 2021-09-14 IMAGING — CR DG CHEST 2V
1 series · 2 of 2 positions shown · non-contrast
Comparison: Chest radiograph 05/03/2020.

CLINICAL DATA: Shortness of breath. Concern for possible pleural
effusion.

EXAM:
CHEST - 2 VIEW

[Series 1: dg chest 2 view · 0.14mm/px · 2 of 2 slices shown]
[im 1/2]
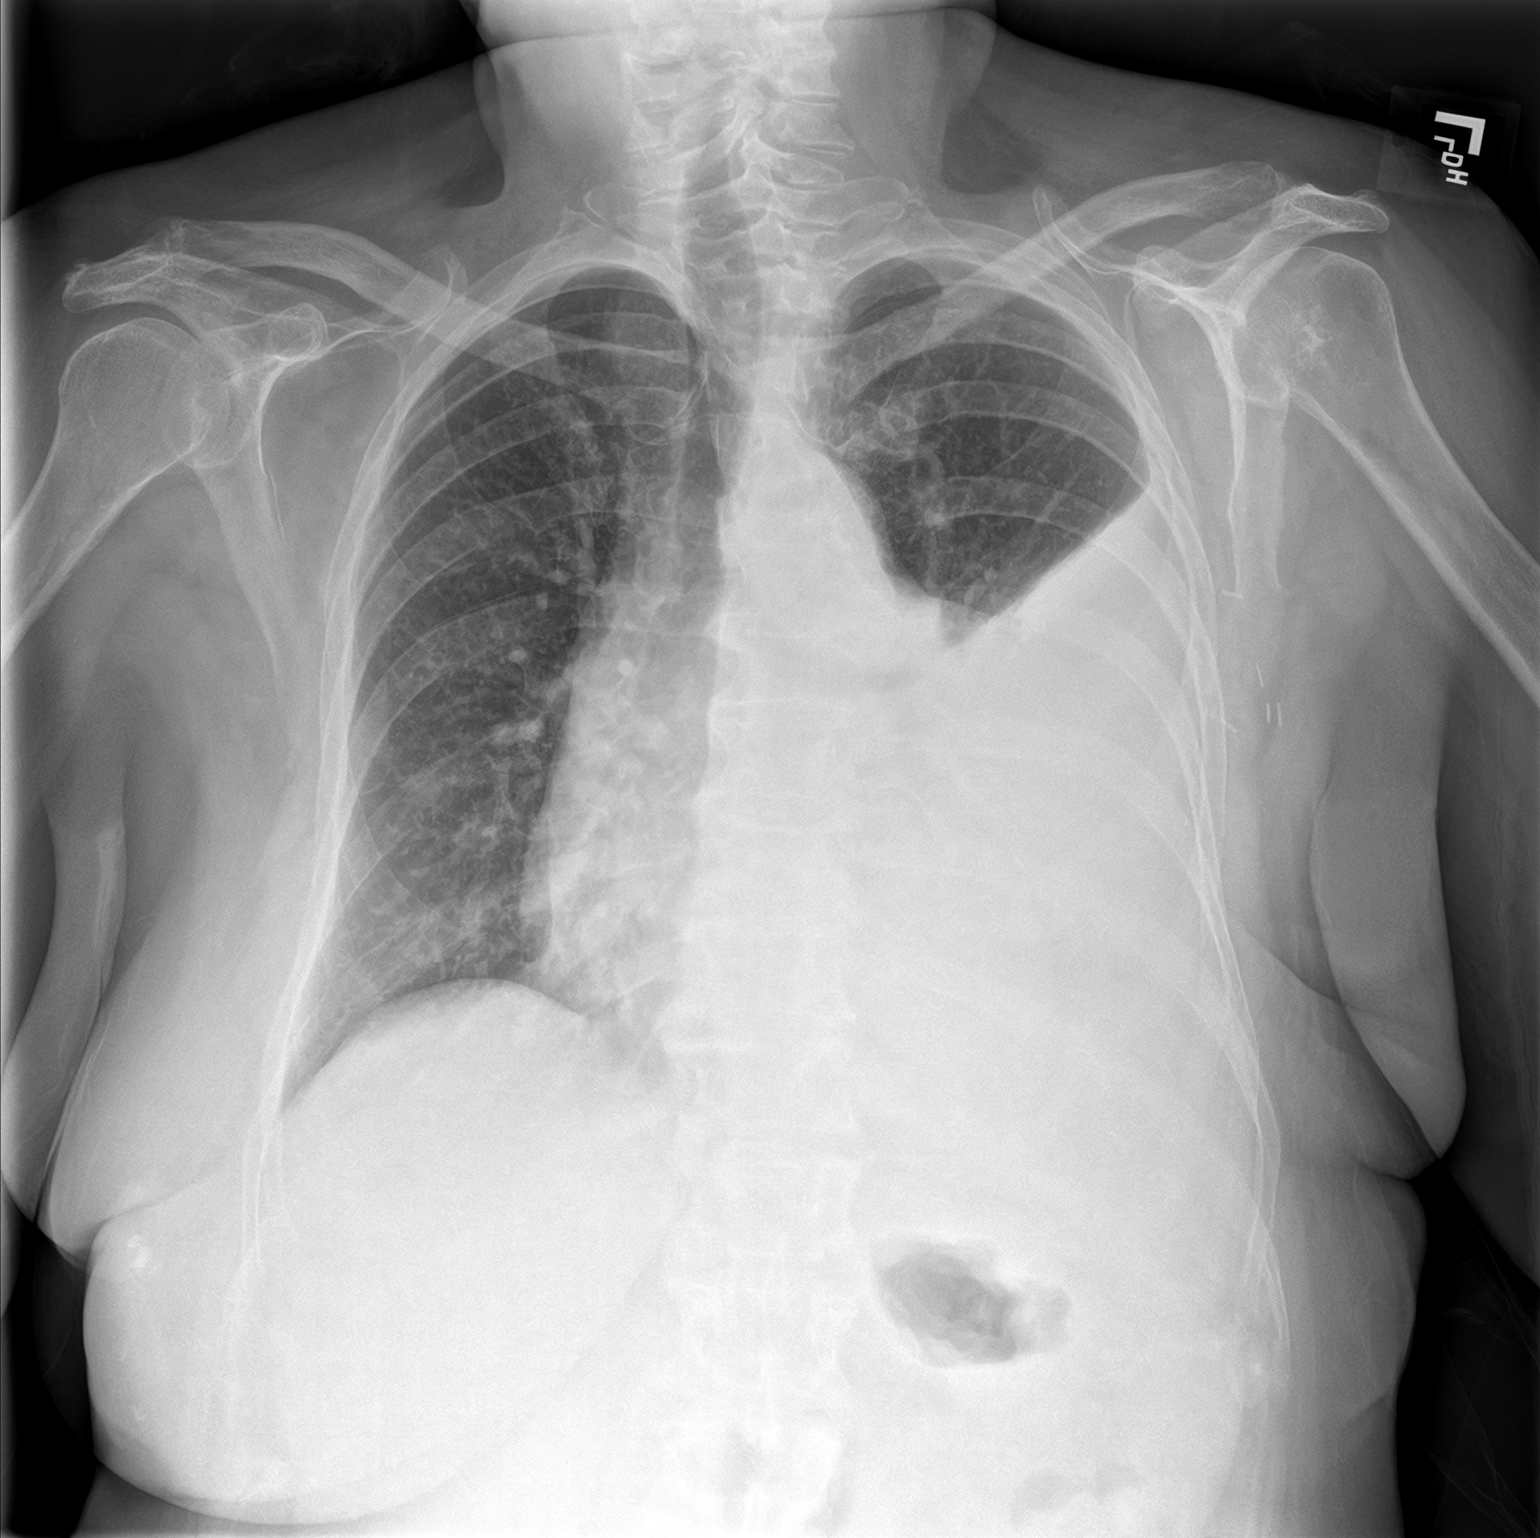
[im 2/2]
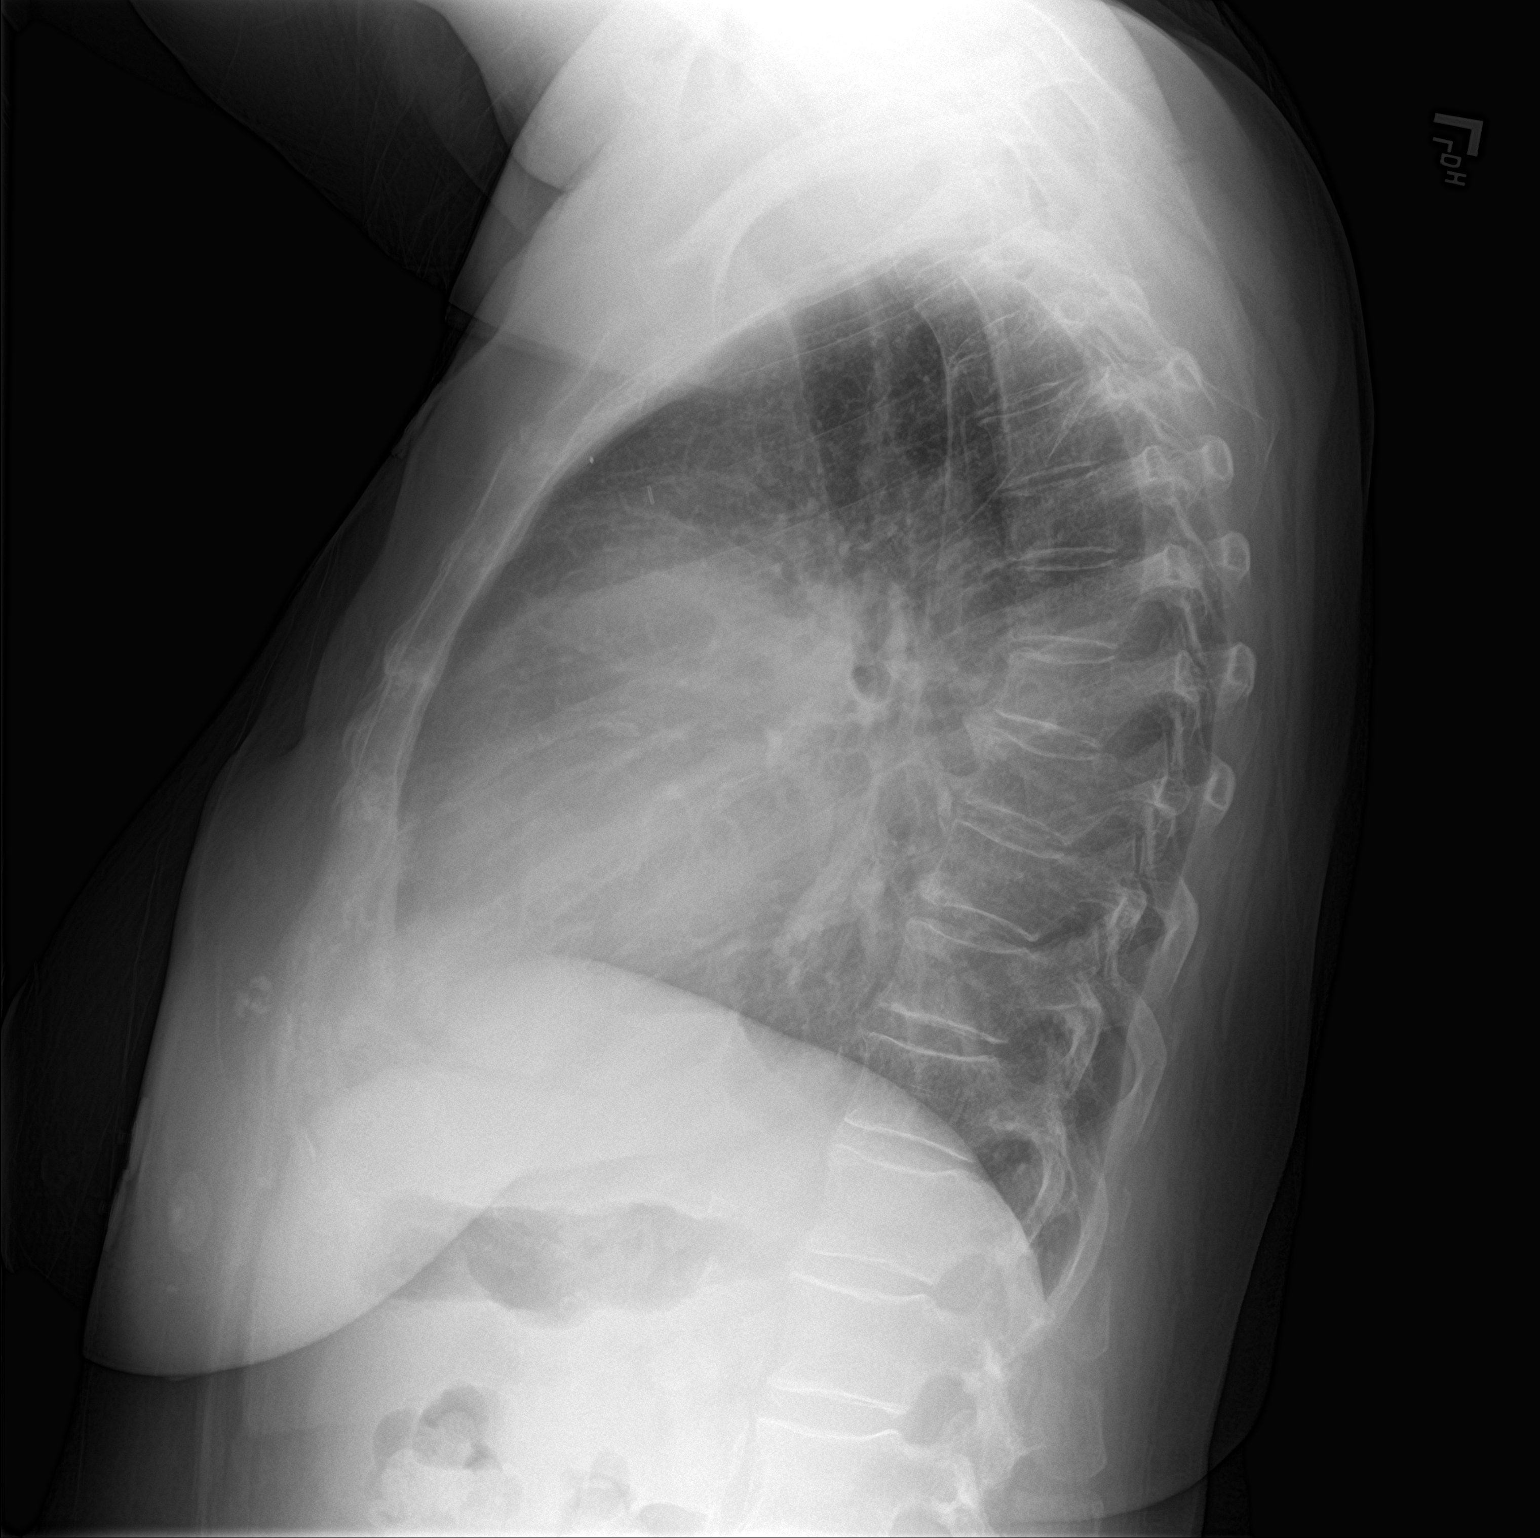

[2 of 2 positions shown; findings below may reference images not displayed]

FINDINGS: Large left pleural effusion. Possible slight shift of the
mediastinum to the right, although the patient is rotated. No focal
consolidation. Possible 1.1 cm pulmonary nodule in the right
midlung. No visible pneumothorax. Left axillary clips. No acute
osseous abnormality.
IMPRESSION: 1. Large left pleural effusion.
2. Possible 1.1 cm pulmonary nodule in the right midlung. Recommend
CT of the chest with contrast to further evaluate.

Findings and recommendations discussed with Dr. Nomasibulele at [DATE]
a.m. via telephone.

## 2021-09-14 IMAGING — DX DG CHEST 1V PORT
1 series · 1 of 1 positions shown · non-contrast
Comparison: Earlier same day; 05/03/2020 11/28/2019; chest
CT-earlier same day; 06/26/2005

CLINICAL DATA: Post large volume left-sided thoracentesis.

EXAM:
PORTABLE CHEST 1 VIEW

[chest ap]
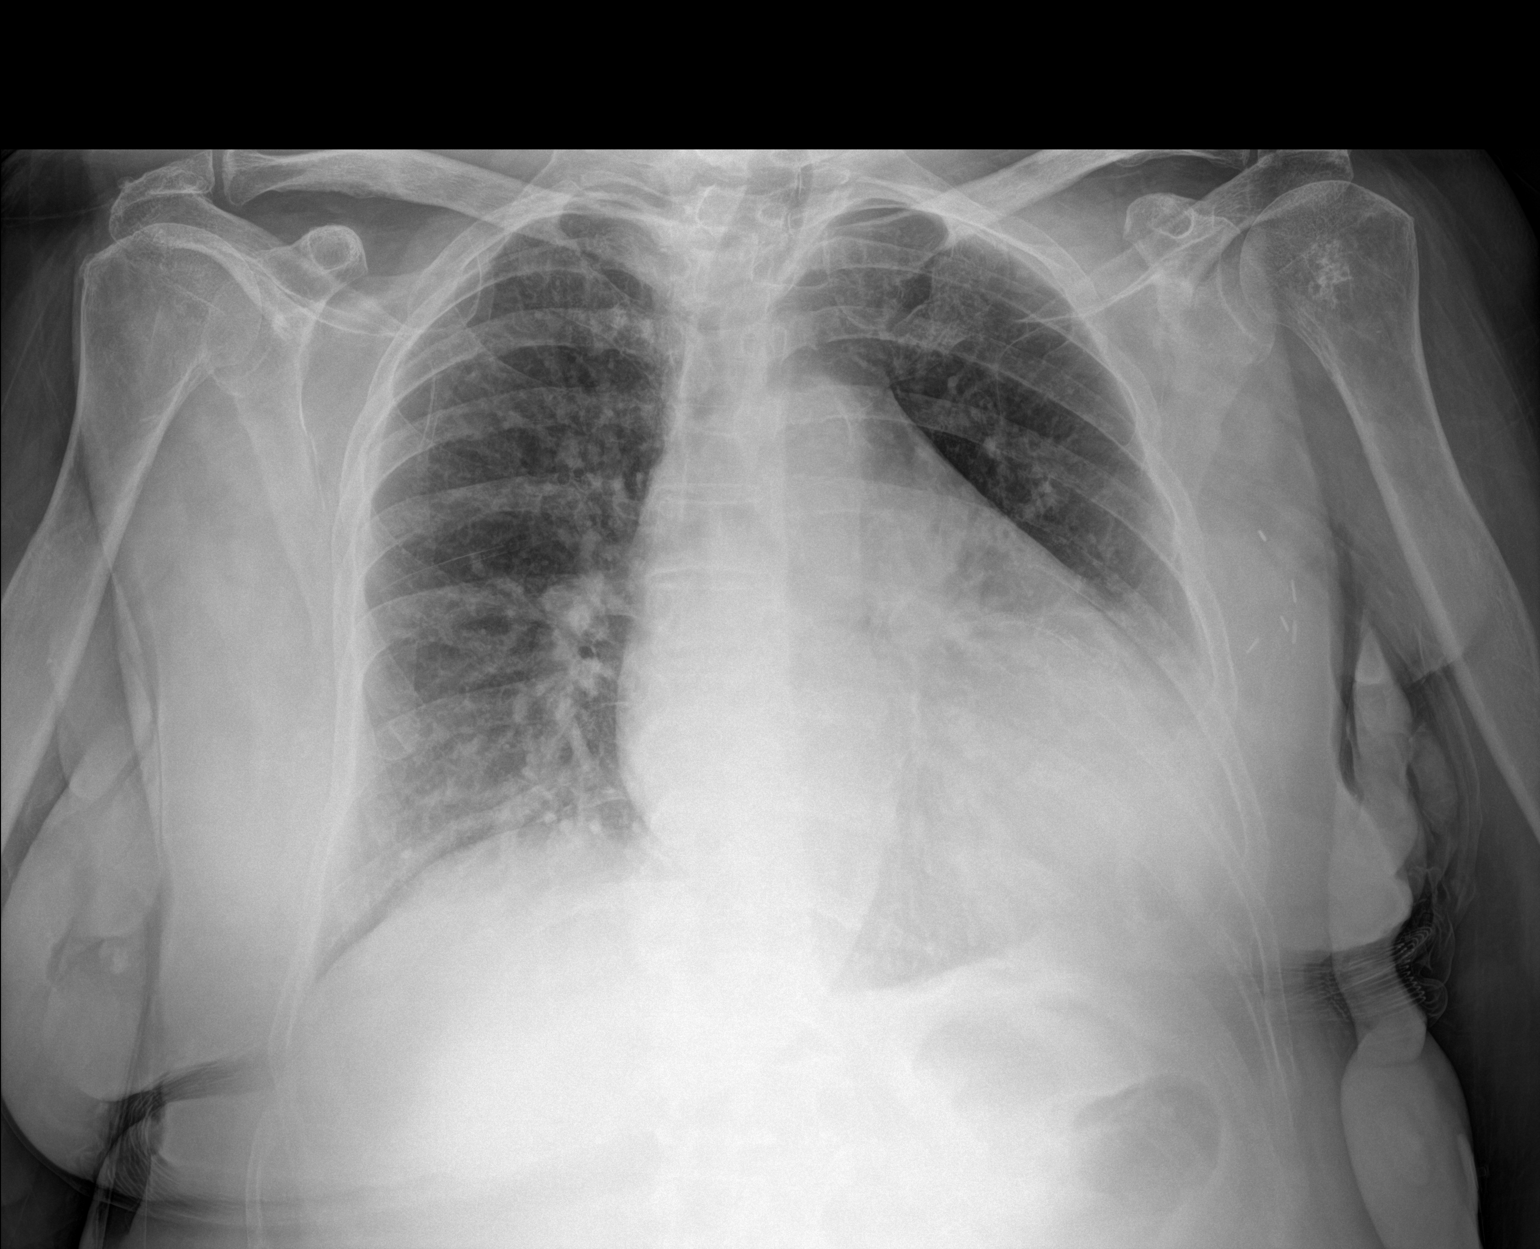

[1 of 1 positions shown; findings below may reference images not displayed]

FINDINGS: Grossly unchanged enlarged cardiac silhouette and mediastinal
contours with atherosclerotic plaque within the thoracic aorta.
Interval reduction/near resolution of left-sided pleural effusion
post thoracentesis. No pneumothorax. Improved aeration of left lung
base with persistent left basilar opacities, likely atelectasis.
Mild pulmonary venous congestion without frank evidence of edema. No
right sided pleural effusion or pneumothorax. No acute osseous
abnormalities. Degenerative change the bilateral acromioclavicular
joints is suspected though incompletely evaluated. Chondroid lesion
is seen within the left humeral head similar to remote chest CT
performed in [id number]
IMPRESSION: 1. Interval reduction/near resolution of left-sided pleural effusion
post thoracentesis. No pneumothorax.
2. Pulmonary venous congestion without frank evidence of edema.

## 2021-09-28 LAB — CUP PACEART REMOTE DEVICE CHECK
Date Time Interrogation Session: 20230118110405
Implantable Lead Implant Date: 20220721
Implantable Lead Location: 753860
Implantable Lead Model: 436909
Implantable Lead Serial Number: 81466093
Implantable Pulse Generator Implant Date: 20220721
Pulse Gen Model: 429525
Pulse Gen Serial Number: 84852229

## 2021-09-28 NOTE — Progress Notes (Signed)
Follow-up Outpatient Visit Date: 09/29/2021  Primary Care Provider: Owens Loffler, MD [place] [id number]  Chief Complaint: Follow-up heart failure and coronary artery disease  HPI:  [name] [name] is a 71 y.o. female with history of  coronary artery disease status post PCI to the LAD (04/2020), chronic HFrEF due to ischemic cardiomyopathy status post ICD, hypertension, hyperlipidemia, arthritis, and breast cancer, who presents for follow-up of the artery disease and chronic HFrEF, who presents for follow-up of coronary artery disease and heart failure.  She was last seen in our office by Christell Faith, PA, in September.  At that time, she was doing well from a heart standpoint without chest pain, palpitations, syncope, or ICD shocks.  Exertional dyspnea and stamina were improving with regular exercise.  Today, [name] reports that she feels fairly well though she continues to have fatigue when doing chores around the house.  She is able to exercise on her "Guerry Bruin" without any difficulty.  She is now using it several times a day, up to 4 minutes at a time.  She denies chest pain, palpitations, lightheadedness, or edema.  She notes some heat intolerance, which is chronic.  She is tolerating her medications well.  She remains on clopidogrel without bleeding.  Her weights have been stable.  --------------------------------------------------------------------------------------------------  Past Medical History:  Diagnosis Date   Allergic rhinitis    Breast cancer ([facility]) 2004   Left Breast Cancer   CHF (congestive heart failure) ([facility])    Coronary artery disease    a. 10/2012 ETT: nl; b. 12/2012 Cath: LM nl, LAD min irregs, D1 min irregs, LCX 30p, RCA 86m, RPDA 40p, EF 50%.   Diastolic dysfunction    a. 12/2012 Echo: EF 50-55%. No rwma. Gr1 DD. Nl RV fxn. Nl PASP.   History of hepatitis B    ?--tested + by [facility]   HLD (hyperlipidemia)    HTN (hypertension)     Hyperlipidemia associated with type 2 diabetes mellitus ([facility]) 12/04/2020   Osteoarthritis    Osteopenia    Past Surgical History:  Procedure Laterality Date   ABDOMINAL HYSTERECTOMY  1985   endometriosis; anatomy   BREAST SURGERY  10/2002   CARDIAC CATHETERIZATION  12/19/12   [place]; EF 50%   COLONOSCOPY  8/09   Hyperplastic polyp; repeat in 10 years   CORONARY STENT INTERVENTION N/A 05/03/2020   Procedure: CORONARY STENT INTERVENTION;  Surgeon: [name], MD;  Location: [facility];  Service: Cardiovascular;  Laterality: N/A;   HIP FRACTURE SURGERY  06/14/2011   ball replaced (Dr. Earvin Hansen)   ICD IMPLANT N/A 03/31/2021   Procedure: ICD IMPLANT;  Surgeon: Vickie Epley, MD;  Location: [facility];  Service: Cardiovascular;  Laterality: N/A;   INTRAVASCULAR ULTRASOUND/IVUS N/A 05/03/2020   Procedure: Intravascular Ultrasound/IVUS;  Surgeon: [name], MD;  Location: [facility];  Service: Cardiovascular;  Laterality: N/A;   LEFT HEART CATH N/A 05/03/2020   Procedure: Left Heart Cath;  Surgeon: [name], MD;  Location: [facility];  Service: Cardiovascular;  Laterality: N/A;   MASTECTOMY Left 2004   RIGHT/LEFT HEART CATH AND CORONARY ANGIOGRAPHY N/A 12/02/2019   Procedure: RIGHT/LEFT HEART CATH AND CORONARY ANGIOGRAPHY;  Surgeon: [name], MD;  Location: [facility];  Service: Cardiovascular;  Laterality: N/A;   TONSILLECTOMY  1958    Current Meds  Medication Sig   acetaminophen (TYLENOL) 650 MG CR tablet Take 1,300 mg by mouth  every 8 (eight) hours as needed for pain.   bismuth subsalicylate (PEPTO BISMOL) 262 MG/15ML suspension Take 30 mLs by mouth every 6 (six) hours as needed for indigestion or diarrhea or loose stools.   Calcium Carbonate-Vitamin D 600-400 MG-UNIT tablet Take 1 tablet by mouth daily.    carvedilol (COREG) 12.5 MG tablet TAKE 1 TABLET (12.5 MG TOTAL) BY MOUTH 2 (TWO) TIMES DAILY WITH A MEAL. DOSAGE  INCREASE   cetirizine (ZYRTEC) 10 MG tablet Take 10 mg by mouth daily as needed for allergies.   clopidogrel (PLAVIX) 75 MG tablet TAKE 1 TABLET BY MOUTH EVERY DAY   dapagliflozin propanediol (FARXIGA) 10 MG TABS tablet Take 1 tablet (10 mg total) by mouth daily.   ENTRESTO 49-51 MG TAKE 1 TABLET BY MOUTH TWICE A DAY   FLUoxetine (PROZAC) 20 MG capsule Take 1 capsule by mouth once daily   furosemide (LASIX) 40 MG tablet Take 0.5 tablets (20 mg total) by mouth daily.   Glycerin-Hypromellose-PEG 400 (DRY EYE RELIEF DROPS) 0.2-0.2-1 % SOLN Place 1 drop into both eyes daily as needed (Dry eye).   Grape Seed Extract 60 MG CAPS Take 60 mg by mouth 2 (two) times daily.   Misc Natural Products (OSTEO BI-FLEX ADV JOINT SHIELD) TABS Take 1 tablet by mouth 2 (two) times daily.   Multiple Vitamin (MULTIVITAMIN WITH MINERALS) TABS tablet Take 1 tablet by mouth daily.   Multiple Vitamins-Minerals (EYE VITAMINS) CAPS Take 1 capsule by mouth daily. Vision Shield   pseudoephedrine-acetaminophen (TYLENOL SINUS) 30-500 MG TABS tablet Take 2 tablets by mouth every 4 (four) hours as needed (Sinus).   rosuvastatin (CRESTOR) 10 MG tablet Take 1 tablet (10 mg total) by mouth daily.   spironolactone (ALDACTONE) 25 MG tablet TAKE 1 TABLET BY MOUTH EVERY DAY    Allergies: Codeine, Elemental sulfur, Morphine, Ace inhibitors, and Sulfonamide derivatives  Social History   Tobacco Use   Smoking status: Former    Packs/day: 0.50    Years: 10.00    Pack years: 5.00    Types: Cigarettes   Smokeless tobacco: Former    Quit date: 01/24/1986   Tobacco comments:    1989 quit   Vaping Use   Vaping Use: Never used  Substance Use Topics   Alcohol use: No   Drug use: No    Family History  Problem Relation Age of Onset   Heart attack Father 65   Heart disease Father    Sarcoidosis Mother    Ovarian cancer Other        Aunt   Diabetes Other        GP   Breast cancer Sister 14   Cancer Sister        breast    Cancer Paternal Aunt        colon/ovarian    Review of Systems: A 12-system review of systems was performed and was negative except as noted in the HPI.  --------------------------------------------------------------------------------------------------  Physical Exam: BP 120/80 (BP Location: Left Arm, Patient Position: Sitting, Cuff Size: Large)    Pulse 82    Ht 5\' 4"  (1.626 m)    Wt 212 lb (96.2 kg)    SpO2 95%    BMI 36.39 kg/m   General:  NAD. Neck: No JVD or HJR, though body habitus limits evaluation. Lungs: Clear to auscultation bilaterally without wheezes or crackles. Heart: Regular rate and rhythm without murmurs, rubs, or gallops. Abdomen: Soft, nontender, nondistended. Extremities: No lower extremity edema.  Lab Results  Component Value Date   WBC 11.3 (H) 03/23/2021   HGB 15.3 03/23/2021   HCT 45.6 03/23/2021   MCV 89 03/23/2021   PLT 313 03/23/2021    Lab Results  Component Value Date   NA 139 03/23/2021   K 4.7 03/23/2021   CL 100 03/23/2021   CO2 24 03/23/2021   BUN 15 03/23/2021   CREATININE 0.67 03/23/2021   GLUCOSE 101 (H) 03/23/2021   ALT 17 08/26/2020    Lab Results  Component Value Date   CHOL 187 08/26/2020   HDL 46.60 08/26/2020   LDLCALC 48 05/04/2020   LDLDIRECT 50.0 08/26/2020   TRIG 262.0 (H) 08/26/2020   CHOLHDL 4 08/26/2020    --------------------------------------------------------------------------------------------------  ASSESSMENT AND PLAN: Coronary artery disease without angina: No angina reported.  Continue aggressive secondary prevention and long-term antiplatelet therapy with clopidogrel.  Chronic HFrEF due to ischemic cardiomyopathy and fatigue: Ms. Manni appears euvolemic on examination with stable NYHA class II symptoms.  She is frustrated by continued shortness of breath with chores around the house.  I encouraged her to keep exercising regularly to help improve her stamina.  We will increase Entresto to 97-103 mg  twice daily.  I will check a CBC and CMP today as well as a BMP in 2 weeks.  I will also check a TSH in the setting of fatigue and heat intolerance.  Continue current doses of carvedilol, dapagliflozin, spironolactone, and furosemide.  If her symptoms do not continue to improve with escalation of GDMT and physical activity, we may need to consider referring her to the advanced heart failure clinic.  Ongoing follow-up of ICD per Dr. Quentin Ore in the device clinic.  Hyperlipidemia: LDL well controlled on last check in 08/2020.  Triglycerides elevated at that time.  We will check a CMP and lipid panel today.  Continue rosuvastatin 10 mg daily; may need to consider adding Vascepa if triglycerides remain above 150.  Follow-up: Return to clinic in 3 months.  [name], MD 09/29/2021 11:28 AM

## 2021-09-29 ENCOUNTER — Ambulatory Visit (INDEPENDENT_AMBULATORY_CARE_PROVIDER_SITE_OTHER): Payer: Medicare Other | Admitting: Internal Medicine

## 2021-09-29 ENCOUNTER — Ambulatory Visit (INDEPENDENT_AMBULATORY_CARE_PROVIDER_SITE_OTHER): Payer: Medicare Other

## 2021-09-29 ENCOUNTER — Encounter: Payer: Self-pay | Admitting: Internal Medicine

## 2021-09-29 ENCOUNTER — Other Ambulatory Visit: Payer: Self-pay

## 2021-09-29 VITALS — BP 120/80 | HR 82 | Ht 64.0 in | Wt 212.0 lb

## 2021-09-29 DIAGNOSIS — R5383 Other fatigue: Secondary | ICD-10-CM | POA: Diagnosis not present

## 2021-09-29 DIAGNOSIS — I255 Ischemic cardiomyopathy: Secondary | ICD-10-CM

## 2021-09-29 DIAGNOSIS — I2510 Atherosclerotic heart disease of native coronary artery without angina pectoris: Secondary | ICD-10-CM

## 2021-09-29 DIAGNOSIS — E785 Hyperlipidemia, unspecified: Secondary | ICD-10-CM

## 2021-09-29 DIAGNOSIS — I10 Essential (primary) hypertension: Secondary | ICD-10-CM | POA: Diagnosis not present

## 2021-09-29 DIAGNOSIS — I5022 Chronic systolic (congestive) heart failure: Secondary | ICD-10-CM

## 2021-09-29 DIAGNOSIS — I5042 Chronic combined systolic (congestive) and diastolic (congestive) heart failure: Secondary | ICD-10-CM

## 2021-09-29 MED ORDER — ENTRESTO 97-103 MG PO TABS: 1.0000 | ORAL_TABLET | Freq: Two times a day (BID) | ORAL | 6 refills | Status: DC

## 2021-09-29 NOTE — Patient Instructions (Signed)
Medication Instructions:  - Your physician has recommended you make the following change in your medication:   1) INCREASE Entresto to 97/103 mg: - take 1 tablet by mouth TWICE daily   *If you need a refill on your cardiac medications before your next appointment, please call your pharmacy*   Lab Work: - Your physician recommends that you have lab work today: CBC/ CMET/ Lipid/ TSH  - Your physician recommends that you return for lab work in: 2 weeks- BMP   If you have labs (blood work) drawn today and your tests are completely normal, you will receive your results only by: [employer] (if you have MyChart) OR A paper copy in the mail If you have any lab test that is abnormal or we need to change your treatment, we will call you to review the results.   Testing/Procedures: - none ordered   Follow-Up: At [facility], you and your health needs are our priority.  As part of our continuing mission to provide you with exceptional heart care, we have created designated Provider Care Teams.  These Care Teams include your primary Cardiologist (physician) and Advanced Practice Providers (APPs -  Physician Assistants and Nurse Practitioners) who all work together to provide you with the care you need, when you need it.  We recommend signing up for the patient portal called "MyChart".  Sign up information is provided on this After Visit Summary.  MyChart is used to connect with patients for Virtual Visits (Telemedicine).  Patients are able to view lab/test results, encounter notes, upcoming appointments, etc.  Non-urgent messages can be sent to your provider as well.   To learn more about what you can do with MyChart, go to [email or web address].    Your next appointment:   3 month(s)  The format for your next appointment:   In Person  Provider:   You may see [name], MD or one of the following Advanced Practice Providers on your designated Care Team:   Murray Hodgkins,  NP Christell Faith, PA-C Cadence Kathlen Mody, [place]    Other Instructions N/a

## 2021-09-30 ENCOUNTER — Telehealth: Payer: Self-pay | Admitting: Internal Medicine

## 2021-09-30 DIAGNOSIS — Z79899 Other long term (current) drug therapy: Secondary | ICD-10-CM

## 2021-09-30 DIAGNOSIS — E875 Hyperkalemia: Secondary | ICD-10-CM

## 2021-09-30 LAB — COMPREHENSIVE METABOLIC PANEL
ALT: 18 IU/L (ref 0–32)
AST: 17 IU/L (ref 0–40)
Albumin/Globulin Ratio: 2 (ref 1.2–2.2)
Albumin: 4.9 g/dL — ABNORMAL HIGH (ref 3.8–4.8)
Alkaline Phosphatase: 77 IU/L (ref 44–121)
BUN/Creatinine Ratio: 18 (ref 12–28)
BUN: 13 mg/dL (ref 8–27)
Bilirubin Total: 0.3 mg/dL (ref 0.0–1.2)
CO2: 23 mmol/L (ref 20–29)
Calcium: 10.7 mg/dL — ABNORMAL HIGH (ref 8.7–10.3)
Chloride: 99 mmol/L (ref 96–106)
Creatinine, Ser: 0.72 mg/dL (ref 0.57–1.00)
Globulin, Total: 2.4 g/dL (ref 1.5–4.5)
Glucose: 106 mg/dL — ABNORMAL HIGH (ref 70–99)
Potassium: 5.3 mmol/L — ABNORMAL HIGH (ref 3.5–5.2)
Sodium: 143 mmol/L (ref 134–144)
Total Protein: 7.3 g/dL (ref 6.0–8.5)
eGFR: 90 mL/min/{1.73_m2} (ref >59)

## 2021-09-30 LAB — LIPID PANEL
Chol/HDL Ratio: 5.2 ratio — ABNORMAL HIGH (ref 0.0–4.4)
Cholesterol, Total: 209 mg/dL — ABNORMAL HIGH (ref 100–199)
HDL: 40 mg/dL (ref >39)
LDL Chol Calc (NIH): 105 mg/dL — ABNORMAL HIGH (ref 0–99)
Triglycerides: 374 mg/dL — ABNORMAL HIGH (ref 0–149)
VLDL Cholesterol Cal: 64 mg/dL — ABNORMAL HIGH (ref 5–40)

## 2021-09-30 LAB — CBC
Hematocrit: 47.9 % — ABNORMAL HIGH (ref 34.0–46.6)
Hemoglobin: 16.1 g/dL — ABNORMAL HIGH (ref 11.1–15.9)
MCH: 29.5 pg (ref 26.6–33.0)
MCHC: 33.6 g/dL (ref 31.5–35.7)
MCV: 88 fL (ref 79–97)
Platelets: 350 10*3/uL (ref 150–450)
RBC: 5.46 x10E6/uL — ABNORMAL HIGH (ref 3.77–5.28)
RDW: 12.6 % (ref 11.7–15.4)
WBC: 9.8 10*3/uL (ref 3.4–10.8)

## 2021-09-30 LAB — TSH: TSH: 2.73 u[IU]/mL (ref 0.450–4.500)

## 2021-09-30 MED ORDER — SPIRONOLACTONE 25 MG PO TABS: 12.5000 mg | ORAL_TABLET | Freq: Every day | ORAL | 3 refills | Status: DC

## 2021-09-30 NOTE — Telephone Encounter (Signed)
I spoke with the patient. I have advised her that the preliminary results for her lab panel from yesterday was reviewed by nursing and that her K+ level did come back slightly elevated at 5.3.  The patient was made aware this was reviewed by Dr. Fletcher Anon (DOD) and recommendations were received to: 1) Decrease her dietary potassium intake  2) Decrease spironolactone to 12.5 mg once daily 3) Repeat a BMET at the [facility] early next week  The patient voices understanding of the above recommendations and is agreeable. She is also advised that Dr. Saunders Revel will sign off on the rest of her labs when able and we will call her with these once this is done.   The patient again voiced understanding.  BMP order placed for the [facility].  Will await follow up results.

## 2021-09-30 NOTE — Telephone Encounter (Signed)
Emily Filbert, RN  09/30/2021  2:51 PM EST Back to Top    K+ 5.3- on aldactone, but this is not a new medication. Secure chat to Dr. Fletcher Anon (DOD) to please review.   Preliminary results reviewed. Forwarded to MD desktop for review and signature.

## 2021-09-30 NOTE — Telephone Encounter (Signed)
Emily Filbert, RN  09/30/2021  3:07 PM EST Back to Top    Secure chat message received from Dr. Fletcher Anon: Please provide her with low potassium diet instructions.  Decrease spironolactone to 12.5 mg once daily and repeat basic metabolic profile at the hospital early next week.

## 2021-10-05 ENCOUNTER — Telehealth: Payer: Self-pay | Admitting: *Deleted

## 2021-10-05 DIAGNOSIS — E785 Hyperlipidemia, unspecified: Secondary | ICD-10-CM

## 2021-10-05 DIAGNOSIS — Z79899 Other long term (current) drug therapy: Secondary | ICD-10-CM

## 2021-10-05 MED ORDER — ROSUVASTATIN CALCIUM 20 MG PO TABS: 20.0000 mg | ORAL_TABLET | Freq: Every day | ORAL | 3 refills | Status: DC

## 2021-10-05 NOTE — Telephone Encounter (Signed)
Spoke with the pt.  Notified of lab results and Dr. Darnelle Bos recc.   Pt will incr rosuvastatin to 20 mg daily.  Pt will double current dose until she picks up new Rx.  New Rx has been sent.  Lab orders placed. Forwarded to scheduling to place recall for 3 month fasting lab (Lipid/ALT).  Pt voiced understanding of previous instructions: Pt will follow low potassium diet Decrease Spironolactone to 12.5 mg daily  [facility]

## 2021-10-05 NOTE — Telephone Encounter (Signed)
-----   Message from Kavin Leech, RN sent at 10/05/2021  8:16 AM EST -----  ----- Message ----- From: [name], MD Sent: 10/05/2021   6:52 AM EST To: Rebec[facility] Alert [facility] Triage  Please let Ms. Mcenery know that her lipids have worsened since last check and that her LDL is now above goal and triglycerides remain significantly elevated.  I recommend that we increase rosuvastatin to 20 mg daily with repeat lipid panel and ALT in 3 months.  I agree with Dr. Tyrell Antonio recommendations regarding spironolactone/potassium management.  She should have a repeat BMP at her convenience sometime this week.

## 2021-10-06 ENCOUNTER — Other Ambulatory Visit
Admission: RE | Admit: 2021-10-06 | Discharge: 2021-10-06 | Disposition: A | Discharge status: Home / Self Care | Payer: Medicare Other | Source: Ambulatory Visit | Attending: Internal Medicine | Admitting: Internal Medicine

## 2021-10-06 ENCOUNTER — Telehealth: Payer: Self-pay | Admitting: Family Medicine

## 2021-10-06 DIAGNOSIS — E875 Hyperkalemia: Secondary | ICD-10-CM | POA: Insufficient documentation

## 2021-10-06 DIAGNOSIS — Z79899 Other long term (current) drug therapy: Secondary | ICD-10-CM | POA: Insufficient documentation

## 2021-10-06 DIAGNOSIS — E785 Hyperlipidemia, unspecified: Secondary | ICD-10-CM | POA: Insufficient documentation

## 2021-10-06 LAB — LIPID PANEL
Cholesterol: 212 mg/dL — ABNORMAL HIGH (ref 0–200)
HDL: 36 mg/dL — ABNORMAL LOW (ref >40)
LDL Cholesterol: UNDETERMINED mg/dL (ref 0–99)
Total CHOL/HDL Ratio: 5.9 RATIO
Triglycerides: 402 mg/dL — ABNORMAL HIGH (ref <150)
VLDL: UNDETERMINED mg/dL (ref 0–40)

## 2021-10-06 LAB — BASIC METABOLIC PANEL
Anion gap: 10 (ref 5–15)
BUN: 14 mg/dL (ref 8–23)
CO2: 30 mmol/L (ref 22–32)
Calcium: 9.8 mg/dL (ref 8.9–10.3)
Chloride: 98 mmol/L (ref 98–111)
Creatinine, Ser: 0.7 mg/dL (ref 0.44–1.00)
GFR, Estimated: >60 mL/min (ref >60)
Glucose, Bld: 119 mg/dL — ABNORMAL HIGH (ref 70–99)
Potassium: 4.7 mmol/L (ref 3.5–5.1)
Sodium: 138 mmol/L (ref 135–145)

## 2021-10-06 LAB — ALT: ALT: 22 U/L (ref 0–44)

## 2021-10-06 LAB — LDL CHOLESTEROL, DIRECT: Direct LDL: 63.9 mg/dL (ref 0–99)

## 2021-10-06 NOTE — Telephone Encounter (Signed)
Please schedule Medicare Wellness Visit with nurse and CPE with fasting labs prior with Dr. Lorelei Pont.

## 2021-10-07 ENCOUNTER — Telehealth: Payer: Self-pay | Admitting: *Deleted

## 2021-10-07 DIAGNOSIS — E785 Hyperlipidemia, unspecified: Secondary | ICD-10-CM

## 2021-10-07 DIAGNOSIS — Z79899 Other long term (current) drug therapy: Secondary | ICD-10-CM

## 2021-10-07 NOTE — Telephone Encounter (Signed)
Called pt and got her scheduled for 3/2 for labs and nurse call and 3/9 for CPE

## 2021-10-07 NOTE — Telephone Encounter (Signed)
Spoke with pt. Notified of lab results and Dr. Darnelle Bos recc.  Pt will continue current medications including decr dose of spironolactone 12.5 mg daily.   Will forward to manager to see if able to credit pt lipid panel, ALT, and direct LDL that was drawn early by hospital lab.   Lipid/ALT reordered to have done in our office in 3 months.  Will forward to scheduling to place recall for 3 month fasting lab in our office.

## 2021-10-07 NOTE — Telephone Encounter (Signed)
Recall placed

## 2021-10-07 NOTE — Telephone Encounter (Signed)
-----   Message from [name], MD sent at 10/07/2021  6:43 AM EST ----- Please let Ms. Coyt know that her potassium has returned to normal and that her renal function remains normal.  It appears that ALT, lipid panel, and direct LDL were inappropriately drawn (order placed for labs to be performed in 3 months, not at same time as BMP for f/u of hyperkalemia).  If possible, please credit Ms. Rarick for ALT, lipid panel, and direct LDL.  She should continue her current medications including decreased dose of spironolactone (12.5 mg daily).

## 2021-10-12 NOTE — Progress Notes (Signed)
Remote ICD transmission.   

## 2021-10-13 ENCOUNTER — Other Ambulatory Visit: Payer: Medicare Other

## 2021-11-09 NOTE — Progress Notes (Signed)
Subjective:   [name] [name] is a 71 y.o. female who presents for Medicare Annual (Subsequent) preventive examination.  I connected with Jennalee Greaves today by telephone and verified that I am speaking with the correct person using two identifiers. Location patient: home Location provider: work Persons participating in the virtual visit: patient, Marine scientist.    I discussed the limitations, risks, security and privacy concerns of performing an evaluation and management service by telephone and the availability of in person appointments. I also discussed with the patient that there may be a patient responsible charge related to this service. The patient expressed understanding and verbally consented to this telephonic visit.    Interactive audio and video telecommunications were attempted between this provider and patient, however failed, due to patient having technical difficulties OR patient did not have access to video capability.  We continued and completed visit with audio only.  Some vital signs may be absent or patient reported.   Time Spent with patient on telephone encounter: 20 minutes  Review of Systems     Cardiac Risk Factors include: advanced age (>67men, >35 women);hypertension;dyslipidemia     Objective:    Today's Vitals   11/10/21 0944  Weight: 212 lb (96.2 kg)  Height: 5\' 4"  (1.626 m)   Body mass index is 36.39 kg/m.  Advanced Directives 11/10/2021 03/31/2021 07/15/2020 06/25/2020 05/03/2020 12/02/2019 11/28/2019  Does Patient Have a Medical Advance Directive? No No No No No No Unable to assess, patient is non-responsive or altered mental status  Would patient like information on creating a medical advance directive? Yes (MAU/Ambulatory/Procedural Areas - Information given) No - Patient declined No - Patient declined Yes (MAU/Ambulatory/Procedural Areas - Information given) No - Patient declined No - Patient declined Yes (Inpatient - patient requests chaplain consult to  create a medical advance directive)    Current Medications (verified) Outpatient Encounter Medications as of 11/10/2021  Medication Sig   acetaminophen (TYLENOL) 650 MG CR tablet Take 1,300 mg by mouth every 8 (eight) hours as needed for pain.   bismuth subsalicylate (PEPTO BISMOL) 262 MG/15ML suspension Take 30 mLs by mouth every 6 (six) hours as needed for indigestion or diarrhea or loose stools.   Calcium Carbonate-Vitamin D 600-400 MG-UNIT tablet Take 1 tablet by mouth daily.    carvedilol (COREG) 12.5 MG tablet TAKE 1 TABLET (12.5 MG TOTAL) BY MOUTH 2 (TWO) TIMES DAILY WITH A MEAL. DOSAGE INCREASE   cetirizine (ZYRTEC) 10 MG tablet Take 10 mg by mouth daily as needed for allergies.   clopidogrel (PLAVIX) 75 MG tablet TAKE 1 TABLET BY MOUTH EVERY DAY   dapagliflozin propanediol (FARXIGA) 10 MG TABS tablet Take 1 tablet (10 mg total) by mouth daily.   FLUoxetine (PROZAC) 20 MG capsule Take 1 capsule by mouth once daily   furosemide (LASIX) 40 MG tablet Take 0.5 tablets (20 mg total) by mouth daily.   Glycerin-Hypromellose-PEG 400 (DRY EYE RELIEF DROPS) 0.2-0.2-1 % SOLN Place 1 drop into both eyes daily as needed (Dry eye).   Grape Seed Extract 60 MG CAPS Take 60 mg by mouth 2 (two) times daily.   Misc Natural Products (OSTEO BI-FLEX ADV JOINT SHIELD) TABS Take 1 tablet by mouth 2 (two) times daily.   Multiple Vitamin (MULTIVITAMIN WITH MINERALS) TABS tablet Take 1 tablet by mouth daily.   Multiple Vitamins-Minerals (EYE VITAMINS) CAPS Take 1 capsule by mouth daily. Vision Shield   pseudoephedrine-acetaminophen (TYLENOL SINUS) 30-500 MG TABS tablet Take 2 tablets by mouth every 4 (four)  hours as needed (Sinus).   rosuvastatin (CRESTOR) 20 MG tablet Take 1 tablet (20 mg total) by mouth daily.   sacubitril-valsartan (ENTRESTO) 97-103 MG Take 1 tablet by mouth 2 (two) times daily.   spironolactone (ALDACTONE) 25 MG tablet Take 0.5 tablets (12.5 mg total) by mouth daily.   No  facility-administered encounter medications on file as of 11/10/2021.    Allergies (verified) Codeine, Elemental sulfur, Morphine, Ace inhibitors, and Sulfonamide derivatives   History: Past Medical History:  Diagnosis Date   Allergic rhinitis    Breast cancer ([place]) 2004   Left Breast Cancer   CHF (congestive heart failure) ([place])    Coronary artery disease    a. 10/2012 ETT: nl; b. 12/2012 Cath: LM nl, LAD min irregs, D1 min irregs, LCX 30p, RCA 49m, RPDA 40p, EF 50%.   Diastolic dysfunction    a. 12/2012 Echo: EF 50-55%. No rwma. Gr1 DD. Nl RV fxn. Nl PASP.   History of hepatitis B    ?--tested + by [facility]   HLD (hyperlipidemia)    HTN (hypertension)    Hyperlipidemia associated with type 2 diabetes mellitus ([place]) 12/04/2020   Osteoarthritis    Osteopenia    Past Surgical History:  Procedure Laterality Date   ABDOMINAL HYSTERECTOMY  1985   endometriosis; anatomy   BREAST SURGERY  10/2002   CARDIAC CATHETERIZATION  12/19/12   [place]; EF 50%   COLONOSCOPY  8/09   Hyperplastic polyp; repeat in 10 years   CORONARY STENT INTERVENTION N/A 05/03/2020   Procedure: CORONARY STENT INTERVENTION;  Surgeon: [name], MD;  Location: [facility];  Service: Cardiovascular;  Laterality: N/A;   HIP FRACTURE SURGERY  06/14/2011   ball replaced (Dr. Earvin Hansen)   ICD IMPLANT N/A 03/31/2021   Procedure: ICD IMPLANT;  Surgeon: Vickie Epley, MD;  Location: [facility];  Service: Cardiovascular;  Laterality: N/A;   INTRAVASCULAR ULTRASOUND/IVUS N/A 05/03/2020   Procedure: Intravascular Ultrasound/IVUS;  Surgeon: [name], MD;  Location: [facility];  Service: Cardiovascular;  Laterality: N/A;   LEFT HEART CATH N/A 05/03/2020   Procedure: Left Heart Cath;  Surgeon: [name], MD;  Location: [facility];  Service: Cardiovascular;  Laterality: N/A;   MASTECTOMY Left 2004   RIGHT/LEFT HEART CATH AND CORONARY ANGIOGRAPHY N/A 12/02/2019   Procedure:  RIGHT/LEFT HEART CATH AND CORONARY ANGIOGRAPHY;  Surgeon: [name], MD;  Location: [facility];  Service: Cardiovascular;  Laterality: N/A;   TONSILLECTOMY  1958   Family History  Problem Relation Age of Onset   Heart attack Father 46   Heart disease Father    Sarcoidosis Mother    Ovarian cancer Other        Aunt   Diabetes Other        GP   Breast cancer Sister 19   Cancer Sister        breast   Cancer Paternal Aunt        colon/ovarian   Social History   Socioeconomic History   Marital status: Widowed    Spouse name: (Widow) to Richardson Landry   Number of children: Not on file   Years of education: Not on file   Highest education level: Not on file  Occupational History   Not on file  Tobacco Use   Smoking status: Former    Packs/day: 0.50    Years: 10.00    Pack years: 5.00    Types: Cigarettes   Smokeless tobacco:  Former    Quit date: 01/24/1986   Tobacco comments:    1989 quit   Vaping Use   Vaping Use: Never used  Substance and Sexual Activity   Alcohol use: No   Drug use: No   Sexual activity: Not on file  Other Topics Concern   Not on file  Social History Narrative   Widowed to husband Renato Gails      No regular exercise   Lives locally w/ tenants that help her w/ chores around the house.   Social Determinants of Health   Financial Resource Strain: Low Risk    Difficulty of Paying Living Expenses: Not hard at all  Food Insecurity: No Food Insecurity   Worried About Charity fundraiser in the Last Year: Never true   [place] in the Last Year: Never true  Transportation Needs: No Transportation Needs   Lack of Transportation (Medical): No   Lack of Transportation (Non-Medical): No  Physical Activity: Sufficiently Active   Days of Exercise per Week: 7 days   Minutes of Exercise per Session: 30 min  Stress: No Stress Concern Present   Feeling of Stress : Not at all  Social Connections: Moderately Integrated   Frequency of  Communication with Friends and Family: Three times a week   Frequency of Social Gatherings with Friends and Family: More than three times a week   Attends Religious Services: More than 4 times per year   Active Member of Clubs or Organizations: No   Attends Music therapist: More than 4 times per year   Marital Status: Widowed    Tobacco Counseling Counseling given: Not Answered Tobacco comments: 1989 quit    Clinical Intake:  Pre-visit preparation completed: Yes  Pain : No/denies pain     BMI - recorded: 36.39 Nutritional Status: BMI > 30  Obese Nutritional Risks: None Diabetes: No  How often do you need to have someone help you when you read instructions, pamphlets, or other written materials from your doctor or pharmacy?: 1 - Never  Diabetic? No  Interpreter Needed?: No  Information entered by :: Orrin Brigham LPN   Activities of Daily Living In your present state of health, do you have any difficulty performing the following activities: 11/10/2021  Hearing? Y  Comment wears hearing aids  Vision? N  Difficulty concentrating or making decisions? N  Walking or climbing stairs? N  Dressing or bathing? N  Doing errands, shopping? N  Preparing Food and eating ? N  Using the Toilet? N  In the past six months, have you accidently leaked urine? N  Do you have problems with loss of bowel control? N  Managing your Medications? N  Managing your Finances? N  Housekeeping or managing your Housekeeping? N  Some recent data might be hidden    Patient Care Team: Owens Loffler, MD as PCP - General End, Harrell Gave, MD as PCP - Cardiology (Cardiology) Vickie Epley, MD as PCP - Electrophysiology (Cardiology)  Indicate any recent Medical Services you may have received from other than [facility] providers in the past year (date may be approximate).     Assessment:   This is a routine wellness examination for Letita.  Hearing/Vision screen Hearing Screening  - Comments:: Wears hearing aids Vision Screening - Comments:: Last exam 08/2021, Dr. Herbert Deaner, wears glasses  Dietary issues and exercise activities discussed: Current Exercise Habits: Home exercise routine, Type of exercise: walking Guerry Bruin), Time (Minutes): 30, Frequency (Times/Week):  7, Weekly Exercise (Minutes/Week): 210, Intensity: Moderate   Goals Addressed             This Visit's Progress    Patient Stated       Would like to maintain current routine       Depression Screen PHQ 2/9 Scores 11/10/2021 03/08/2021 08/02/2020 06/29/2020 08/19/2019 12/06/2017  PHQ - 2 Score 0 0 0 2 2 2   PHQ- 9 Score - - 3 7 5 5     Fall Risk Fall Risk  11/10/2021 03/08/2021 10/11/2020 06/25/2020 12/09/2019  Falls in the past year? 0 1 0 1 0  Comment - - - - -  Number falls in past yr: 0 0 0 0 0  Comment - - - - -  Injury with Fall? 0 0 0 - 0  Risk for fall due to : No Fall Risks History of fall(s) - - -  Follow up Falls prevention discussed Falls evaluation completed;Education provided Falls evaluation completed - Falls evaluation completed    FALL RISK PREVENTION PERTAINING TO THE HOME:  Any stairs in or around the home? Yes  If so, are there any without handrails? No  Home free of loose throw rugs in walkways, pet beds, electrical cords, etc? Yes  Adequate lighting in your home to reduce risk of falls? Yes   ASSISTIVE DEVICES UTILIZED TO PREVENT FALLS:  Life alert? No  Use of a cane, walker or w/c? No  Grab bars in the bathroom? Yes  Shower chair or bench in shower? No  Elevated toilet seat or a handicapped toilet? Yes   TIMED UP AND GO:  Was the test performed? No .    Cognitive Function: Normal cognitive status assessed by  this Nurse Health Advisor. No abnormalities found.   MMSE - Mini Mental State Exam 12/06/2017  Orientation to time 5  Orientation to Place 5  Registration 3  Attention/ Calculation 0  Recall 3  Language- name 2 objects 0  Language- repeat 1  Language-  follow 3 step command 3  Language- read & follow direction 0  Write a sentence 0  Copy design 0  Total score 20        Immunizations Immunization History  Administered Date(s) Administered   Fluad Quad(high Dose 65+) 08/19/2019, 07/17/2020   Influenza Split 07/10/2011   Influenza Whole 08/11/2008, 10/11/2009, 08/31/2010   Influenza, High Dose Seasonal PF 07/30/2018   Influenza,inj,Quad PF,6+ Mos 05/21/2013, 06/23/2014, 07/12/2015, 12/06/2017   PFIZER(Purple Top)SARS-COV-2 Vaccination 01/05/2020, 01/27/2020   Pneumococcal Conjugate-13 12/06/2017   Pneumococcal Polysaccharide-23 08/19/2019   Td 08/31/2010    TDAP status: Due, Education has been provided regarding the importance of this vaccine. Advised may receive this vaccine at local pharmacy or Health Dept. Aware to provide a copy of the vaccination record if obtained from local pharmacy or Health Dept. Verbalized acceptance and understanding.  Flu Vaccine status: Due, Education has been provided regarding the importance of this vaccine. Advised may receive this vaccine at local pharmacy or Health Dept. Aware to provide a copy of the vaccination record if obtained from local pharmacy or Health Dept. Verbalized acceptance and understanding.  Pneumococcal vaccine status: Up to date  Covid-19 vaccine status: Information provided on how to obtain vaccines.   Qualifies for Shingles Vaccine? Yes   Zostavax completed No   Shingrix Completed?: No.    Education has been provided regarding the importance of this vaccine. Patient has been advised to call insurance company to determine out of pocket expense if  they have not yet received this vaccine. Advised may also receive vaccine at local pharmacy or Health Dept. Verbalized acceptance and understanding.  Screening Tests Health Maintenance  Topic Date Due   FOOT EXAM  Never done   OPHTHALMOLOGY EXAM  Never done   Zoster Vaccines- Shingrix (1 of 2) Never done   HEMOGLOBIN A1C   10/24/2019   MAMMOGRAM  01/16/2020   COVID-19 Vaccine (3 - Pfizer risk series) 02/24/2020   TETANUS/TDAP  08/31/2020   INFLUENZA VACCINE  04/11/2021   COLONOSCOPY (Pts 45-10yrs Insurance coverage will need to be confirmed)  02/01/2028   Pneumonia Vaccine 21+ Years old  Completed   DEXA SCAN  Completed   Hepatitis C Screening  Completed   HPV VACCINES  Aged Out    Health Maintenance  Health Maintenance Due  Topic Date Due   FOOT EXAM  Never done   OPHTHALMOLOGY EXAM  Never done   Zoster Vaccines- Shingrix (1 of 2) Never done   HEMOGLOBIN A1C  10/24/2019   MAMMOGRAM  01/16/2020   COVID-19 Vaccine (3 - Pfizer risk series) 02/24/2020   TETANUS/TDAP  08/31/2020   INFLUENZA VACCINE  04/11/2021    Colorectal cancer screening: Type of screening: Colonoscopy. Completed 01/31/18. Repeat every 10 years  Mammogram status: Ordered 11/10/21. Pt provided with contact info and advised to call to schedule appt.   Bone Density status: Ordered 11/10/21. Pt provided with contact info and advised to call to schedule appt.  Lung Cancer Screening: (Low Dose CT Chest recommended if Age 75-80 years, 30 pack-year currently smoking OR have quit w/in 15years.) does not qualify.     Additional Screening:  Hepatitis C Screening: does qualify; Completed 12/19/17  Vision Screening: Recommended annual ophthalmology exams for early detection of glaucoma and other disorders of the eye. Is the patient up to date with their annual eye exam?  Yes  Who is the provider or what is the name of the office in which the patient attends annual eye exams? Dr. Herbert Deaner  Dental Screening: Recommended annual dental exams for proper oral hygiene  Community Resource Referral / Chronic Care Management: CRR required this visit?  No   CCM required this visit?  No      Plan:     I have personally reviewed and noted the following in the patients chart:   Medical and social history Use of alcohol, tobacco or illicit drugs   Current medications and supplements including opioid prescriptions.  Functional ability and status Nutritional status Physical activity Advanced directives List of other physicians Hospitalizations, surgeries, and ER visits in previous 12 months Vitals Screenings to include cognitive, depression, and falls Referrals and appointments  In addition, I have reviewed and discussed with patient certain preventive protocols, quality metrics, and best practice recommendations. A written personalized care plan for preventive services as well as general preventive health recommendations were provided to patient.   Due to this being a telephonic visit, the after visit summary with patients personalized plan was offered to patient via mail or my-chart. Patient would like to access on my-chart.   Loma Messing, LPN   [id number]   Nurse Health Advisor  Nurse Notes: none

## 2021-11-10 ENCOUNTER — Ambulatory Visit (INDEPENDENT_AMBULATORY_CARE_PROVIDER_SITE_OTHER): Payer: Medicare Other

## 2021-11-10 ENCOUNTER — Other Ambulatory Visit: Payer: Self-pay

## 2021-11-10 ENCOUNTER — Other Ambulatory Visit (INDEPENDENT_AMBULATORY_CARE_PROVIDER_SITE_OTHER): Payer: Medicare Other | Admitting: Family Medicine

## 2021-11-10 ENCOUNTER — Other Ambulatory Visit (INDEPENDENT_AMBULATORY_CARE_PROVIDER_SITE_OTHER): Payer: Medicare Other

## 2021-11-10 VITALS — Ht 64.0 in | Wt 212.0 lb

## 2021-11-10 DIAGNOSIS — E559 Vitamin D deficiency, unspecified: Secondary | ICD-10-CM

## 2021-11-10 DIAGNOSIS — R7303 Prediabetes: Secondary | ICD-10-CM

## 2021-11-10 DIAGNOSIS — Z1231 Encounter for screening mammogram for malignant neoplasm of breast: Secondary | ICD-10-CM | POA: Diagnosis not present

## 2021-11-10 DIAGNOSIS — D582 Other hemoglobinopathies: Secondary | ICD-10-CM

## 2021-11-10 DIAGNOSIS — E538 Deficiency of other specified B group vitamins: Secondary | ICD-10-CM

## 2021-11-10 DIAGNOSIS — Z0000 Encounter for general adult medical examination without abnormal findings: Secondary | ICD-10-CM

## 2021-11-10 DIAGNOSIS — R5383 Other fatigue: Secondary | ICD-10-CM | POA: Diagnosis not present

## 2021-11-10 DIAGNOSIS — Z780 Asymptomatic menopausal state: Secondary | ICD-10-CM | POA: Diagnosis not present

## 2021-11-10 DIAGNOSIS — I5022 Chronic systolic (congestive) heart failure: Secondary | ICD-10-CM | POA: Diagnosis not present

## 2021-11-10 LAB — IBC + FERRITIN
Ferritin: 54.1 ng/mL (ref 10.0–291.0)
Iron: 62 ug/dL (ref 42–145)
Saturation Ratios: 16.6 % — ABNORMAL LOW (ref 20.0–50.0)
TIBC: 372.4 ug/dL (ref 250.0–450.0)
Transferrin: 266 mg/dL (ref 212.0–360.0)

## 2021-11-10 LAB — HEMOGLOBIN A1C: Hgb A1c MFr Bld: 5.7 % (ref 4.6–6.5)

## 2021-11-10 LAB — VITAMIN D 25 HYDROXY (VIT D DEFICIENCY, FRACTURES): VITD: 33.15 ng/mL (ref 30.00–100.00)

## 2021-11-10 LAB — VITAMIN B12: Vitamin B-12: 548 pg/mL (ref 211–911)

## 2021-11-10 NOTE — Progress Notes (Signed)
A1c, E11.9 diabetes mellitus type 2  ?Vit D: E55.9 low vitamin d levels  ?B12: E53.8  ?Iron panel/ferritin: elevated hemoglobin ?

## 2021-11-10 NOTE — Patient Instructions (Signed)
[name] [name] , Thank you for taking time to complete Medicare Wellness Visit. I appreciate your ongoing commitment to your health goals. Please review the following plan we discussed and let me know if I can assist you in the future.   Screening recommendations/referrals: Colonoscopy: up to date, completed 01/31/18, due 02/01/28 Mammogram: due, last completed 01/15/18, ordered today someone will call to schedule an appointment Bone Density: due, last completed 09/09/08, ordered today, someone will call to schedule an appointment Recommended yearly ophthalmology/optometry visit for glaucoma screening and checkup Recommended yearly dental visit for hygiene and checkup  Vaccinations: Influenza vaccine: Due-May obtain vaccine at our office or your local pharmacy. Pneumococcal vaccine: up to date  Tdap vaccine: due 08/31/10, medicare may cover in the event that you are cut or injured  Shingles vaccine: Discuss with pharmacy   Covid-19:newest booster available at your local pharmacy  Advanced directives: Please bring a copy of Living Will and/or [place] for your chart, when available    Conditions/risks identified: see problem list   Next appointment: Follow up in one year for your annual wellness visit    Preventive Care 65 Years and Older, Female Preventive care refers to lifestyle choices and visits with your health care provider that can promote health and wellness. What does preventive care include? A yearly physical exam. This is also called an annual well check. Dental exams once or twice a year. Routine eye exams. Ask your health care provider how often you should have your eyes checked. Personal lifestyle choices, including: Daily care of your teeth and gums. Regular physical activity. Eating a healthy diet. Avoiding tobacco and drug use. Limiting alcohol use. Practicing safe sex. Taking low-dose aspirin every day. Taking vitamin and mineral supplements as  recommended by your health care provider. What happens during an annual well check? The services and screenings done by your health care provider during your annual well check will depend on your age, overall health, lifestyle risk factors, and family history of disease. Counseling  Your health care provider may ask you questions about your: Alcohol use. Tobacco use. Drug use. Emotional well-being. Home and relationship well-being. Sexual activity. Eating habits. History of falls. Memory and ability to understand (cognition). Work and work Statistician. Reproductive health. Screening  You may have the following tests or measurements: Height, weight, and BMI. Blood pressure. Lipid and cholesterol levels. These may be checked every 5 years, or more frequently if you are over 16 years old. Skin check. Lung cancer screening. You may have this screening every year starting at age 22 if you have a 30-pack-year history of smoking and currently smoke or have quit within the past 15 years. Fecal occult blood test (FOBT) of the stool. You may have this test every year starting at age 36. Flexible sigmoidoscopy or colonoscopy. You may have a sigmoidoscopy every 5 years or a colonoscopy every 10 years starting at age 15. Hepatitis C blood test. Hepatitis B blood test. Sexually transmitted disease (STD) testing. Diabetes screening. This is done by checking your blood sugar (glucose) after you have not eaten for a while (fasting). You may have this done every 1-3 years. Bone density scan. This is done to screen for osteoporosis. You may have this done starting at age 26. Mammogram. This may be done every 1-2 years. Talk to your health care provider about how often you should have regular mammograms. Talk with your health care provider about your test results, treatment options, and if necessary, the need  for more tests. Vaccines  Your health care provider may recommend certain vaccines, such  as: Influenza vaccine. This is recommended every year. Tetanus, diphtheria, and acellular pertussis (Tdap, Td) vaccine. You may need a Td booster every 10 years. Zoster vaccine. You may need this after age 50. Pneumococcal 13-valent conjugate (PCV13) vaccine. One dose is recommended after age 19. Pneumococcal polysaccharide (PPSV23) vaccine. One dose is recommended after age 14. Talk to your health care provider about which screenings and vaccines you need and how often you need them. This information is not intended to replace advice given to you by your health care provider. Make sure you discuss any questions you have with your health care provider. Document Released: 09/24/2015 Document Revised: 05/17/2016 Document Reviewed: 06/29/2015 Elsevier Interactive Patient Education  2017 [place] Prevention in the Home Falls can cause injuries. They can happen to people of all ages. There are many things you can do to make your home safe and to help prevent falls. What can I do on the outside of my home? Regularly fix the edges of walkways and driveways and fix any cracks. Remove anything that might make you trip as you walk through a door, such as a raised step or threshold. Trim any bushes or trees on the path to your home. Use bright outdoor lighting. Clear any walking paths of anything that might make someone trip, such as rocks or tools. Regularly check to see if handrails are loose or broken. Make sure that both sides of any steps have handrails. Any raised decks and porches should have guardrails on the edges. Have any leaves, snow, or ice cleared regularly. Use sand or salt on walking paths during winter. Clean up any spills in your garage right away. This includes oil or grease spills. What can I do in the bathroom? Use night lights. Install grab bars by the toilet and in the tub and shower. Do not use towel bars as grab bars. Use non-skid mats or decals in the tub or  shower. If you need to sit down in the shower, use a plastic, non-slip stool. Keep the floor dry. Clean up any water that spills on the floor as soon as it happens. Remove soap buildup in the tub or shower regularly. Attach bath mats securely with double-sided non-slip rug tape. Do not have throw rugs and other things on the floor that can make you trip. What can I do in the bedroom? Use night lights. Make sure that you have a light by your bed that is easy to reach. Do not use any sheets or blankets that are too big for your bed. They should not hang down onto the floor. Have a firm chair that has side arms. You can use this for support while you get dressed. Do not have throw rugs and other things on the floor that can make you trip. What can I do in the kitchen? Clean up any spills right away. Avoid walking on wet floors. Keep items that you use a lot in easy-to-reach places. If you need to reach something above you, use a strong step stool that has a grab bar. Keep electrical cords out of the way. Do not use floor polish or wax that makes floors slippery. If you must use wax, use non-skid floor wax. Do not have throw rugs and other things on the floor that can make you trip. What can I do with my stairs? Do not leave any items on the stairs.  Make sure that there are handrails on both sides of the stairs and use them. Fix handrails that are broken or loose. Make sure that handrails are as long as the stairways. Check any carpeting to make sure that it is firmly attached to the stairs. Fix any carpet that is loose or worn. Avoid having throw rugs at the top or bottom of the stairs. If you do have throw rugs, attach them to the floor with carpet tape. Make sure that you have a light switch at the top of the stairs and the bottom of the stairs. If you do not have them, ask someone to add them for you. What else can I do to help prevent falls? Wear shoes that: Do not have high heels. Have  rubber bottoms. Are comfortable and fit you well. Are closed at the toe. Do not wear sandals. If you use a stepladder: Make sure that it is fully opened. Do not climb a closed stepladder. Make sure that both sides of the stepladder are locked into place. Ask someone to hold it for you, if possible. Clearly mark and make sure that you can see: Any grab bars or handrails. First and last steps. Where the edge of each step is. Use tools that help you move around (mobility aids) if they are needed. These include: Canes. Walkers. Scooters. Crutches. Turn on the lights when you go into a dark area. Replace any light bulbs as soon as they burn out. Set up your furniture so you have a clear path. Avoid moving your furniture around. If any of your floors are uneven, fix them. If there are any pets around you, be aware of where they are. Review your medicines with your doctor. Some medicines can make you feel dizzy. This can increase your chance of falling. Ask your doctor what other things that you can do to help prevent falls. This information is not intended to replace advice given to you by your health care provider. Make sure you discuss any questions you have with your health care provider. Document Released: 06/24/2009 Document Revised: 02/03/2016 Document Reviewed: 10/02/2014 Elsevier Interactive Patient Education  2017 [employer].

## 2021-11-11 ENCOUNTER — Other Ambulatory Visit: Payer: Self-pay | Admitting: Family Medicine

## 2021-11-11 DIAGNOSIS — E119 Type 2 diabetes mellitus without complications: Secondary | ICD-10-CM

## 2021-11-11 DIAGNOSIS — D582 Other hemoglobinopathies: Secondary | ICD-10-CM

## 2021-11-11 DIAGNOSIS — E559 Vitamin D deficiency, unspecified: Secondary | ICD-10-CM

## 2021-11-11 DIAGNOSIS — E538 Deficiency of other specified B group vitamins: Secondary | ICD-10-CM

## 2021-11-17 ENCOUNTER — Ambulatory Visit (INDEPENDENT_AMBULATORY_CARE_PROVIDER_SITE_OTHER): Payer: Medicare Other | Admitting: Family Medicine

## 2021-11-17 ENCOUNTER — Encounter: Payer: Self-pay | Admitting: Family Medicine

## 2021-11-17 ENCOUNTER — Other Ambulatory Visit: Payer: Self-pay

## 2021-11-17 VITALS — BP 128/78 | HR 74 | Temp 97.0°F | Ht 64.5 in | Wt 212.0 lb

## 2021-11-17 DIAGNOSIS — Z85820 Personal history of malignant melanoma of skin: Secondary | ICD-10-CM

## 2021-11-17 DIAGNOSIS — I5022 Chronic systolic (congestive) heart failure: Secondary | ICD-10-CM

## 2021-11-17 DIAGNOSIS — E785 Hyperlipidemia, unspecified: Secondary | ICD-10-CM | POA: Diagnosis not present

## 2021-11-17 DIAGNOSIS — I10 Essential (primary) hypertension: Secondary | ICD-10-CM

## 2021-11-17 DIAGNOSIS — I255 Ischemic cardiomyopathy: Secondary | ICD-10-CM

## 2021-11-17 DIAGNOSIS — J301 Allergic rhinitis due to pollen: Secondary | ICD-10-CM | POA: Diagnosis not present

## 2021-11-17 MED ORDER — FLUTICASONE PROPIONATE 50 MCG/ACT NA SUSP: 2.0000 | Freq: Every day | NASAL | 6 refills | Status: DC

## 2021-11-17 NOTE — Patient Instructions (Signed)
Bivalent Covid booster ? ?

## 2021-11-17 NOTE — Progress Notes (Signed)
Hearing Screening - Comments:: Has Hearing Aids. Wearing them today. ?Vision Screening - Comments:: December 2022 ? ?

## 2021-11-17 NOTE — Progress Notes (Signed)
? ? ?Dillian Feig T. Caliana Spires, MD, [facility] ?Therapist, music at [facility] ?[place] ?[place], [address] ? ?Phone: [phone]  FAX: [phone] ? ?[name] [name] - 71 y.o. female  MRN [id number]  Date of Birth: [birth year] ? ?Date: 11/17/2021  PCP: Owens Loffler, MD  Referral: Owens Loffler, MD ? ?Chief Complaint  ?Patient presents with  ?? Annual Exam  ?? Right Wrist Pain  ?  Intermittent pain.  ?? Cough  ?  Dry cough started a few days ago with the trees blooming.  ? ? ?This visit occurred during the SARS-CoV-2 public health emergency.  Safety protocols were in place, including screening questions prior to the visit, additional usage of staff PPE, and extensive cleaning of exam room while observing appropriate contact time as indicated for disinfecting solutions.  ? ?Subjective:  ? ?[name] [name] is a 71 y.o. very pleasant female patient who presents with the following: ? ?The patient presents to follow-up on multiple medical problems she already had her Medicare wellness visit with the health coach. ? ?She is having some ongoing cough with some known allergies. ? ?She also has some right intermittent wrist pain. ?Overuse, lateral. ? ?She does have chronic congestive heart failure and coronary disease, and she does a very good job with following up with cardiology and compliance. ? ?Has had shingles vaccine ? ?Health Maintenance  ?Topic Date Due  ?? FOOT EXAM  Never done  ?? OPHTHALMOLOGY EXAM  Never done  ?? Zoster Vaccines- Shingrix (1 of 2) Never done  ?? MAMMOGRAM  01/16/2020  ?? COVID-19 Vaccine (3 - Pfizer risk series) 02/24/2020  ?? TETANUS/TDAP  08/31/2020  ?? INFLUENZA VACCINE  12/09/2021 (Originally 04/11/2021)  ?? HEMOGLOBIN A1C  05/13/2022  ?? COLONOSCOPY (Pts 45-34yr Insurance coverage will need to be confirmed)  02/01/2028  ?? Pneumonia Vaccine 71 Years old  Completed  ?? DEXA SCAN  Completed  ?? Hepatitis C Screening  Completed  ?? HPV VACCINES  Aged Out   ?  ?Immunization History  ?Administered Date(s) Administered  ?? Fluad Quad(high Dose 65+) 08/19/2019, 07/17/2020  ?? Influenza Split 07/10/2011  ?? Influenza Whole 08/11/2008, 10/11/2009, 08/31/2010  ?? Influenza, High Dose Seasonal PF 07/30/2018  ?? Influenza,inj,Quad PF,6+ Mos 05/21/2013, 06/23/2014, 07/12/2015, 12/06/2017  ?? PFIZER(Purple Top)SARS-COV-2 Vaccination 01/05/2020, 01/27/2020  ?? Pneumococcal Conjugate-13 12/06/2017  ?? Pneumococcal Polysaccharide-23 08/19/2019  ?? Td 08/31/2010  ?  ? ?Lipids: They are elevated  ?panel reviewed with patient. ? ?Lipids: ?Lab Results  ?Component Value Date  ? CHOL 212 (H) 10/06/2021  ? ?Lab Results  ?Component Value Date  ? HDL 36 (L) 10/06/2021  ? ?Lab Results  ?Component Value Date  ? LDLCALC UNABLE TO CALCULATE IF TRIGLYCERIDE OVER 400 mg/dL 10/06/2021  ? ?Lab Results  ?Component Value Date  ? TRIG 402 (H) 10/06/2021  ? ?Lab Results  ?Component Value Date  ? CHOLHDL 5.9 10/06/2021  ? ? ?Lab Results  ?Component Value Date  ? ALT 22 10/06/2021  ? AST 17 09/29/2021  ? ALKPHOS 77 09/29/2021  ? BILITOT 0.3 09/29/2021  ?  ?HTN: Tolerating all medications without side effects ?Stable and at goal ?No CP, no sob. No HA. ? ?BP Readings from Last 3 Encounters:  ?11/17/21 128/78  ?09/29/21 120/80  ?07/13/21 102/78  ? ? ?Basic Metabolic Panel: ?   ?Component Value Date/Time  ? NA 138 10/06/2021 1147  ? NA 143 09/29/2021 1153  ? K 4.7 10/06/2021 1147  ? CL 98 10/06/2021 1147  ?  CO2 30 10/06/2021 1147  ? BUN 14 10/06/2021 1147  ? BUN 13 09/29/2021 1153  ? CREATININE 0.70 10/06/2021 1147  ? GLUCOSE 119 (H) 10/06/2021 1147  ? CALCIUM 9.8 10/06/2021 1147  ?  ? ?Review of Systems is noted in the HPI, as appropriate ? ?Objective:  ? ?BP 128/78 (BP Location: Left Arm, Patient Position: Sitting, Cuff Size: Large)   Pulse 74   Temp (!) 97 ?F (36.1 ?C)   Ht 5' 4.5" (1.638 m)   Wt 212 lb (96.2 kg)   SpO2 98%   BMI 35.83 kg/m?  ? ?GEN: WDWN, NAD, Non-toxic ?HEENT: Atraumatic,  Normocephalic. Neck supple. No masses. ?CV: RRR, No M/G/R. No JVD. No thrill. No extra heart sounds. ?PULM: CTA B, no wheezes, crackles, rhonchi. No retractions. No resp. distress. No accessory muscle use. ?EXTR: No c/c/e ?NEURO Normal gait.  ?PSYCH: Normally interactive. Conversant.  ? ?Laboratory and Imaging Data: ? ?Assessment and Plan:  ? ?  ICD-10-CM   ?1. Essential hypertension  I10   ?  ?2. History of melanoma  Z85.820 Ambulatory referral to Dermatology  ?  ?3. Hyperlipidemia LDL goal <70  E78.5   ?  ?4. Seasonal allergic rhinitis due to pollen  J30.1   ?  ?5. Chronic HFrEF (heart failure with reduced ejection fraction) (HCC)  I50.22   ?  ? ?Good compliance. ?Reviewed health maintenance with her. ? ?Allergies are flared up, continue with other basic treatment and add some Flonase. ? ?History of melanoma, make sure that she sees dermatology annually. ? ?Very compliant with heart failure meds. ? ?On Crestor 20. ? ?Meds ordered this encounter  ?Medications  ?? fluticasone (FLONASE) 50 MCG/ACT nasal spray  ?  Sig: Place 2 sprays into both nostrils daily.  ?  Dispense:  16 g  ?  Refill:  6  ? ?There are no discontinued medications. ?Orders Placed This Encounter  ?Procedures  ?? Ambulatory referral to Dermatology  ? ? ?Follow-up: Return in about 6 months (around 05/20/2022). ? ?Dragon [employer] One speech-to-text software was used for transcription in this dictation.  Possible transcriptional errors can occur using Editor, commissioning.  ? ?Signed, ? ?Schylar Wuebker T. Darcel Frane, MD ? ? ?Outpatient Encounter Medications as of 11/17/2021  ?Medication Sig  ?? acetaminophen (TYLENOL) 650 MG CR tablet Take 1,300 mg by mouth every 8 (eight) hours as needed for pain.  ?? bismuth subsalicylate (PEPTO BISMOL) 262 MG/15ML suspension Take 30 mLs by mouth every 6 (six) hours as needed for indigestion or diarrhea or loose stools.  ?? Calcium Carbonate-Vitamin D 600-400 MG-UNIT tablet Take 1 tablet by mouth daily.   ?? carvedilol (COREG) 12.5 MG  tablet TAKE 1 TABLET (12.5 MG TOTAL) BY MOUTH 2 (TWO) TIMES DAILY WITH A MEAL. DOSAGE INCREASE  ?? cetirizine (ZYRTEC) 10 MG tablet Take 10 mg by mouth daily as needed for allergies.  ?? clopidogrel (PLAVIX) 75 MG tablet TAKE 1 TABLET BY MOUTH EVERY DAY  ?? dapagliflozin propanediol (FARXIGA) 10 MG TABS tablet Take 1 tablet (10 mg total) by mouth daily.  ?? FLUoxetine (PROZAC) 20 MG capsule Take 1 capsule by mouth once daily  ?? fluticasone (FLONASE) 50 MCG/ACT nasal spray Place 2 sprays into both nostrils daily.  ?? furosemide (LASIX) 40 MG tablet Take 0.5 tablets (20 mg total) by mouth daily.  ?? Glycerin-Hypromellose-PEG 400 (DRY EYE RELIEF DROPS) 0.2-0.2-1 % SOLN Place 1 drop into both eyes daily as needed (Dry eye).  ?? Grape Seed Extract 60 MG CAPS Take 60  mg by mouth 2 (two) times daily.  ?? Misc Natural Products (OSTEO BI-FLEX ADV JOINT SHIELD) TABS Take 1 tablet by mouth 2 (two) times daily.  ?? Multiple Vitamin (MULTIVITAMIN WITH MINERALS) TABS tablet Take 1 tablet by mouth daily.  ?? Multiple Vitamins-Minerals (EYE VITAMINS) CAPS Take 1 capsule by mouth daily. Vision Shield  ?? pseudoephedrine-acetaminophen (TYLENOL SINUS) 30-500 MG TABS tablet Take 2 tablets by mouth every 4 (four) hours as needed (Sinus).  ?? rosuvastatin (CRESTOR) 20 MG tablet Take 1 tablet (20 mg total) by mouth daily.  ?? sacubitril-valsartan (ENTRESTO) 97-103 MG Take 1 tablet by mouth 2 (two) times daily.  ?? spironolactone (ALDACTONE) 25 MG tablet Take 0.5 tablets (12.5 mg total) by mouth daily.  ? ?No facility-administered encounter medications on file as of 11/17/2021.  ?  ?

## 2021-11-20 ENCOUNTER — Encounter: Payer: Self-pay | Admitting: Family Medicine

## 2021-11-22 ENCOUNTER — Ambulatory Visit
Admission: RE | Admit: 2021-11-22 | Discharge: 2021-11-22 | Disposition: A | Discharge status: Home / Self Care | Payer: Medicare Other | Source: Ambulatory Visit | Attending: Family Medicine | Admitting: Family Medicine

## 2021-11-22 DIAGNOSIS — Z1231 Encounter for screening mammogram for malignant neoplasm of breast: Secondary | ICD-10-CM

## 2021-12-02 ENCOUNTER — Other Ambulatory Visit: Payer: Self-pay | Admitting: Internal Medicine

## 2021-12-08 ENCOUNTER — Ambulatory Visit
Admission: RE | Admit: 2021-12-08 | Discharge: 2021-12-08 | Disposition: A | Discharge status: Home / Self Care | Payer: Medicare Other | Source: Ambulatory Visit | Attending: Family Medicine | Admitting: Family Medicine

## 2021-12-08 DIAGNOSIS — M85852 Other specified disorders of bone density and structure, left thigh: Secondary | ICD-10-CM | POA: Diagnosis not present

## 2021-12-08 DIAGNOSIS — Z780 Asymptomatic menopausal state: Secondary | ICD-10-CM

## 2021-12-08 DIAGNOSIS — M810 Age-related osteoporosis without current pathological fracture: Secondary | ICD-10-CM | POA: Diagnosis not present

## 2021-12-22 ENCOUNTER — Other Ambulatory Visit (INDEPENDENT_AMBULATORY_CARE_PROVIDER_SITE_OTHER): Payer: Medicare Other

## 2021-12-22 DIAGNOSIS — I255 Ischemic cardiomyopathy: Secondary | ICD-10-CM | POA: Diagnosis not present

## 2021-12-22 DIAGNOSIS — I2510 Atherosclerotic heart disease of native coronary artery without angina pectoris: Secondary | ICD-10-CM

## 2021-12-22 DIAGNOSIS — I5022 Chronic systolic (congestive) heart failure: Secondary | ICD-10-CM | POA: Diagnosis not present

## 2021-12-22 DIAGNOSIS — Z79899 Other long term (current) drug therapy: Secondary | ICD-10-CM

## 2021-12-22 DIAGNOSIS — E785 Hyperlipidemia, unspecified: Secondary | ICD-10-CM | POA: Diagnosis not present

## 2021-12-23 ENCOUNTER — Telehealth: Payer: Self-pay | Admitting: *Deleted

## 2021-12-23 DIAGNOSIS — Z79899 Other long term (current) drug therapy: Secondary | ICD-10-CM

## 2021-12-23 DIAGNOSIS — E785 Hyperlipidemia, unspecified: Secondary | ICD-10-CM

## 2021-12-23 LAB — LIPID PANEL
Chol/HDL Ratio: 4.3 ratio (ref 0.0–4.4)
Cholesterol, Total: 154 mg/dL (ref 100–199)
HDL: 36 mg/dL — ABNORMAL LOW (ref >39)
LDL Chol Calc (NIH): 74 mg/dL (ref 0–99)
Triglycerides: 272 mg/dL — ABNORMAL HIGH (ref 0–149)
VLDL Cholesterol Cal: 44 mg/dL — ABNORMAL HIGH (ref 5–40)

## 2021-12-23 LAB — BASIC METABOLIC PANEL
BUN/Creatinine Ratio: 19 (ref 12–28)
BUN: 11 mg/dL (ref 8–27)
CO2: 27 mmol/L (ref 20–29)
Calcium: 9.7 mg/dL (ref 8.7–10.3)
Chloride: 101 mmol/L (ref 96–106)
Creatinine, Ser: 0.58 mg/dL (ref 0.57–1.00)
Glucose: 106 mg/dL — ABNORMAL HIGH (ref 70–99)
Potassium: 4.3 mmol/L (ref 3.5–5.2)
Sodium: 143 mmol/L (ref 134–144)
eGFR: 97 mL/min/{1.73_m2} (ref >59)

## 2021-12-23 LAB — ALT: ALT: 23 IU/L (ref 0–32)

## 2021-12-23 MED ORDER — ROSUVASTATIN CALCIUM 40 MG PO TABS: 40.0000 mg | ORAL_TABLET | Freq: Every day | ORAL | 3 refills | Status: DC

## 2021-12-23 NOTE — Telephone Encounter (Signed)
-----   Message from [name], MD sent at 12/23/2021 10:08 AM EDT ----- ?Please let [name] [name] know that her kidney function, potassium, and liver function are normal.  Her triglycerides have improved since January but are still slightly elevated.  Her LDL has also risen slightly and is now above goal.  I recommend increasing rosuvastatin to 40 mg daily with follow-up lipid panel and ALT in 3 months. ?

## 2021-12-23 NOTE — Telephone Encounter (Signed)
Spoke with pt, notified of lab results and Dr. Darnelle Bos recc.  ?Pt voiced understanding. Pt will: ? ?-INCREASE rosuvastatin to 40 mg daily  ?  - Pt will double current dose for total of 40 mg  ?  - New Rx sent to pt's pharmacy ? ?-Repeat fasting lipid panel and ALT in 3 months ?  - Orders placed to be done at the medical mall ?  - Instructions given to pt for repeat labs at [facility] ?  - Reminder sent to follow up ? ?Pt has no further questions at this time.  ?

## 2021-12-26 DIAGNOSIS — D225 Melanocytic nevi of trunk: Secondary | ICD-10-CM | POA: Diagnosis not present

## 2021-12-26 DIAGNOSIS — Z08 Encounter for follow-up examination after completed treatment for malignant neoplasm: Secondary | ICD-10-CM | POA: Diagnosis not present

## 2021-12-26 DIAGNOSIS — Z85820 Personal history of malignant melanoma of skin: Secondary | ICD-10-CM | POA: Diagnosis not present

## 2021-12-26 DIAGNOSIS — L821 Other seborrheic keratosis: Secondary | ICD-10-CM | POA: Diagnosis not present

## 2021-12-28 ENCOUNTER — Ambulatory Visit (INDEPENDENT_AMBULATORY_CARE_PROVIDER_SITE_OTHER): Payer: Medicare Other | Admitting: Internal Medicine

## 2021-12-28 ENCOUNTER — Encounter: Payer: Self-pay | Admitting: Internal Medicine

## 2021-12-28 VITALS — BP 120/82 | HR 80 | Ht 64.0 in | Wt 213.0 lb

## 2021-12-28 DIAGNOSIS — I255 Ischemic cardiomyopathy: Secondary | ICD-10-CM

## 2021-12-28 DIAGNOSIS — I2510 Atherosclerotic heart disease of native coronary artery without angina pectoris: Secondary | ICD-10-CM

## 2021-12-28 DIAGNOSIS — I5022 Chronic systolic (congestive) heart failure: Secondary | ICD-10-CM | POA: Diagnosis not present

## 2021-12-28 DIAGNOSIS — E785 Hyperlipidemia, unspecified: Secondary | ICD-10-CM | POA: Diagnosis not present

## 2021-12-28 NOTE — Addendum Note (Signed)
Addended by: Darlyne Russian on: 12/28/2021 09:37 AM ? ? Modules accepted: Orders ? ?

## 2021-12-28 NOTE — Patient Instructions (Signed)
Medication Instructions:  ? ?Your physician recommends that you continue on your current medications as directed. Please refer to the Current Medication list given to you today. ? ?*If you need a refill on your cardiac medications before your next appointment, please call your pharmacy* ? ? ?Lab Work: ? ?Your physician recommends that you return for FASTING lab work at the medical mall at [facility] in [place] (mid July) Lipid panel / CMET ? ?-  Please go to the [facility]  ?-  You will check in at the front desk to the right as you walk into the atrium.  ?-  Valet Parking is offered if needed. ?-  No appointment needed. You may go any day between 7 am and 6 pm. ? ? ?Testing/Procedures: ? ?None ordered ? ? ?Follow-Up: ?At [facility], you and your health needs are our priority.  As part of our continuing mission to provide you with exceptional heart care, we have created designated Provider Care Teams.  These Care Teams include your primary Cardiologist (physician) and Advanced Practice Providers (APPs -  Physician Assistants and Nurse Practitioners) who all work together to provide you with the care you need, when you need it. ? ?We recommend signing up for the patient portal called "MyChart".  Sign up information is provided on this After Visit Summary.  MyChart is used to connect with patients for Virtual Visits (Telemedicine).  Patients are able to view lab/test results, encounter notes, upcoming appointments, etc.  Non-urgent messages can be sent to your provider as well.   ?To learn more about what you can do with MyChart, go to [email or web address].   ? ?Your next appointment:   ?6 month(s) ? ?The format for your next appointment:   ?In Person ? ?Provider:   ?You may see [name], MD or one of the following Advanced Practice Providers on your designated Care Team:   ?Murray Hodgkins, NP ?Christell Faith, PA-C ?Cadence Kathlen Mody, PA-C{ ? ? ?Important Information About Sugar ? ? ? ? ? ? ?

## 2021-12-28 NOTE — Progress Notes (Signed)
? ?Follow-up Outpatient Visit ?Date: 12/28/2021 ? ?Primary Care Provider: ?Owens Loffler, MD ?[place] ?[place] [address] ? ?Chief Complaint: Follow-up CAD and HFrEF ? ?HPI:  [name] [name] is a 71 y.o. 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  I last saw her in January, at which time she continued to experience fatigue doing chores around the house.  She was exercising regularly on her "Guerry Bruin" without any difficulty.  We agreed to increase Entresto to optimize her GDMT.  Spironolactone was decreased due to mild hyperkalemia. ? ?Today, [name] [name] reports that she is feeling fairly well, better than at our last visit.  She has more stamina and is able to perform her chores with less fatigue.  She continues to exercise several times a day on her [place].  She denies chest pain, shortness of breath, palpitations, lightheadedness, edema, and orthopnea.  She had a viral illness a few weeks ago, which interfered with her usual exercise regimen.  She attributes her slight weight gain over the last month or 2 to this. ? ?-------------------------------------------------------------------------------------------------- ? ?Past Medical History:  ?Diagnosis Date  ? Allergic rhinitis   ? Breast cancer ([place]) 2004  ? Left Breast Cancer  ? CHF (congestive heart failure) ([place])   ? Coronary artery disease   ? a. 10/2012 ETT: nl; b. 12/2012 Cath: LM nl, LAD min irregs, D1 min irregs, LCX 30p, RCA 65m RPDA 40p, EF 50%.  ? Diastolic dysfunction   ? a. 12/2012 Echo: EF 50-55%. No rwma. Gr1 DD. Nl RV fxn. Nl PASP.  ? History of hepatitis B   ? ?--tested + by [facility]  ? HLD (hyperlipidemia)   ? HTN (hypertension)   ? Hyperlipidemia associated with type 2 diabetes mellitus [place] 12/04/2020  ? Osteoarthritis    ? Osteopenia   ? ?Past Surgical History:  ?Procedure Laterality Date  ? ABDOMINAL HYSTERECTOMY  1985  ? endometriosis; anatomy  ? BREAST SURGERY  10/2002  ? CARDIAC CATHETERIZATION  12/19/12  ? [facility]; EF 50%  ? COLONOSCOPY  8/09  ? Hyperplastic polyp; repeat in 10 years  ? CORONARY STENT INTERVENTION N/A 05/03/2020  ? Procedure: CORONARY STENT INTERVENTION;  Surgeon: ENelva Bush MD;  [facility];  Service: Cardiovascular;  Laterality: N/A;  ? HIP FRACTURE SURGERY  06/14/2011  ? ball replaced (Dr. REarvin Hansen  ? ICD IMPLANT N/A 03/31/2021  ? Procedure: ICD IMPLANT;  Surgeon: LVickie Epley MD;  [facility];  Service: Cardiovascular;  Laterality: N/A;  ? INTRAVASCULAR ULTRASOUND/IVUS N/A 05/03/2020  ? Procedure: Intravascular Ultrasound/IVUS;  Surgeon: ENelva Bush MD;  [facility];  Service: Cardiovascular;  Laterality: N/A;  ? LEFT HEART CATH N/A 05/03/2020  ? Procedure: Left Heart Cath;  Surgeon: ENelva Bush MD;  [facility];  Service: Cardiovascular;  Laterality: N/A;  ? MASTECTOMY Left 2004  ? RIGHT/LEFT HEART CATH AND CORONARY ANGIOGRAPHY N/A 12/02/2019  ? Procedure: RIGHT/LEFT HEART CATH AND CORONARY ANGIOGRAPHY;  Surgeon: ENelva Bush MD;  [facility];  Service: Cardiovascular;  Laterality: N/A;  ? TONSILLECTOMY  1958  ? ? ?Current Meds  ?Medication Sig  ? acetaminophen (TYLENOL) 650 MG CR tablet Take 1,300 mg by mouth every 8 (eight) hours as needed for pain.  ? bismuth subsalicylate (  PEPTO BISMOL) 262 MG/15ML suspension Take 30 mLs by mouth every 6 (six) hours as needed for indigestion or diarrhea or loose stools.  ? Calcium Carbonate-Vitamin D 600-400 MG-UNIT tablet Take 1 tablet by mouth daily.   ? carvedilol (COREG) 12.5 MG tablet TAKE 1 TABLET (12.5 MG TOTAL) BY MOUTH 2 (TWO) TIMES DAILY WITH A MEAL. DOSAGE INCREASE  ? cetirizine (ZYRTEC) 10 MG tablet Take 10 mg by mouth daily as needed for allergies.   ? clopidogrel (PLAVIX) 75 MG tablet TAKE 1 TABLET BY MOUTH EVERY DAY  ? dapagliflozin propanediol (FARXIGA) 10 MG TABS tablet Take 1 tablet (10 mg total) by mouth daily.  ? FLUoxetine (PROZAC) 20 MG capsule Take 1 capsule by mouth once daily  ? fluticasone (FLONASE) 50 MCG/ACT nasal spray Place 2 sprays into both nostrils daily.  ? furosemide (LASIX) 40 MG tablet Take 0.5 tablets (20 mg total) by mouth daily.  ? Glycerin-Hypromellose-PEG 400 (DRY EYE RELIEF DROPS) 0.2-0.2-1 % SOLN Place 1 drop into both eyes daily as needed (Dry eye).  ? Grape Seed Extract 60 MG CAPS Take 60 mg by mouth 2 (two) times daily.  ? Misc Natural Products (OSTEO BI-FLEX ADV JOINT SHIELD) TABS Take 1 tablet by mouth 2 (two) times daily.  ? Multiple Vitamin (MULTIVITAMIN WITH MINERALS) TABS tablet Take 1 tablet by mouth daily.  ? Multiple Vitamins-Minerals (EYE VITAMINS) CAPS Take 1 capsule by mouth daily. Vision Shield  ? pseudoephedrine-acetaminophen (TYLENOL SINUS) 30-500 MG TABS tablet Take 2 tablets by mouth every 4 (four) hours as needed (Sinus).  ? rosuvastatin (CRESTOR) 40 MG tablet Take 1 tablet (40 mg total) by mouth daily.  ? sacubitril-valsartan (ENTRESTO) 97-103 MG Take 1 tablet by mouth 2 (two) times daily.  ? spironolactone (ALDACTONE) 25 MG tablet Take 0.5 tablets (12.5 mg total) by mouth daily.  ? ? ?Allergies: Codeine, Elemental sulfur, Morphine, Ace inhibitors, and Sulfonamide derivatives ? ?Social History  ? ?Tobacco Use  ? Smoking status: Former  ?  Packs/day: 0.50  ?  Years: 10.00  ?  Pack years: 5.00  ?  Types: Cigarettes  ?  Passive exposure: Past  ? Smokeless tobacco: Former  ?  Quit date: 01/24/1986  ? Tobacco comments:  ?  1989 quit   ?Vaping Use  ? Vaping Use: Never used  ?Substance Use Topics  ? Alcohol use: No  ? Drug use: No  ? ? ?Family History  ?Problem Relation Age of Onset  ? Heart attack Father 49  ? Heart disease Father   ? Sarcoidosis Mother   ? Ovarian cancer Other   ?     Aunt  ? Diabetes Other   ?      GP  ? Breast cancer Sister 76  ? Cancer Sister   ?     breast  ? Cancer Paternal Aunt   ?     colon/ovarian  ? ? ?Review of Systems: ?A 12-system review of systems was performed and was negative except as noted in the HPI. ? ?-------------------------------------------------------------------------------------------------- ? ?Physical Exam: ?BP 120/82 (BP Location: Left Arm, Patient Position: Sitting, Cuff Size: Large)   Pulse 80   Ht '5\' 4"'$  (1.626 m)   Wt 213 lb (96.6 kg)   SpO2 95%   BMI 36.56 kg/m?  ? ?General:  NAD. ?Neck: No JVD or HJR, though body habitus limits evaluation. ?Lungs: Clear to auscultation bilaterally without wheezes or crackles. ?Heart: Regular rate and rhythm without murmurs, rubs, or gallops. ?Abdomen: Soft, nontender, nondistended. ?Extremities:  No lower extremity edema. ? ?EKG: Normal sinus rhythm with incomplete left bundle branch block, LVH, and inferolateral ST/T changes.  Nonspecific ST/T changes in the lateral precordial leads are new since 07/13/2021 but similar to 05/25/2021. ? ?Lab Results  ?Component Value Date  ? WBC 9.8 09/29/2021  ? HGB 16.1 (H) 09/29/2021  ? HCT 47.9 (H) 09/29/2021  ? MCV 88 09/29/2021  ? PLT 350 09/29/2021  ? ? ?Lab Results  ?Component Value Date  ? NA 143 12/22/2021  ? K 4.3 12/22/2021  ? CL 101 12/22/2021  ? CO2 27 12/22/2021  ? BUN 11 12/22/2021  ? CREATININE 0.58 12/22/2021  ? GLUCOSE 106 (H) 12/22/2021  ? ALT 23 12/22/2021  ? ? ?Lab Results  ?Component Value Date  ? CHOL 154 12/22/2021  ? HDL 36 (L) 12/22/2021  ? [place] 74 12/22/2021  ? LDLDIRECT 63.9 10/06/2021  ? TRIG 272 (H) 12/22/2021  ? CHOLHDL 4.3 12/22/2021  ? ? ?-------------------------------------------------------------------------------------------------- ? ?ASSESSMENT AND PLAN: ?Coronary artery disease: ?No angina reported.  Continue aggressive secondary prevention including recent escalation of rosuvastatin to 40 mg daily to target LDL and triglycerides less than 70 and 150, respectively.   Continue long-term antiplatelet therapy with clopidogrel.  I encouraged [name] [name] to keep working on lifestyle modifications. ? ?Chronic HFrEF due to ischemic cardiomyopathy: ?[name] [name] appears [employer]

## 2021-12-29 ENCOUNTER — Telehealth: Payer: Self-pay | Admitting: Family Medicine

## 2021-12-29 ENCOUNTER — Ambulatory Visit (INDEPENDENT_AMBULATORY_CARE_PROVIDER_SITE_OTHER): Payer: Medicare Other

## 2021-12-29 DIAGNOSIS — I255 Ischemic cardiomyopathy: Secondary | ICD-10-CM

## 2021-12-29 LAB — CUP PACEART REMOTE DEVICE CHECK
Battery Voltage: 3.11 V
Brady Statistic RV Percent Paced: 0 %
Date Time Interrogation Session: 20230420073107
HighPow Impedance: 85 Ohm
Implantable Lead Implant Date: 20220721
Implantable Lead Location: 753860
Implantable Lead Model: 436909
Implantable Lead Serial Number: 81466093
Implantable Pulse Generator Implant Date: 20220721
Lead Channel Impedance Value: 1011 Ohm
Lead Channel Sensing Intrinsic Amplitude: 20 mV
Lead Channel Sensing Intrinsic Amplitude: 7.8 mV
Lead Channel Setting Pacing Amplitude: 3 V
Lead Channel Setting Pacing Pulse Width: 0.4 ms
Lead Channel Setting Sensing Sensitivity: 0.8 mV
Pulse Gen Model: 429525
Pulse Gen Serial Number: 84852229

## 2021-12-29 MED ORDER — ALENDRONATE SODIUM 70 MG PO TABS: 70.0000 mg | ORAL_TABLET | ORAL | 3 refills | Status: DC

## 2021-12-29 NOTE — Telephone Encounter (Signed)
Ok, sent to [facility]. ? ?No diagnosis found. ? ?Meds ordered this encounter  ?Medications  ? alendronate (FOSAMAX) 70 MG tablet  ?  Sig: Take 1 tablet (70 mg total) by mouth every 7 (seven) days. Take with a full glass of water on an empty stomach.  ?  Dispense:  12 tablet  ?  Refill:  3  ? ?  ?

## 2021-12-29 NOTE — Telephone Encounter (Signed)
Left message for [name] [name] that Dr. Lorelei Pont sent her in a Rx for Fosamax.  Advised she only takes this once a week with a full glass of water on an empty stomach.  I ask that she call me back if she has any questions.  ?

## 2021-12-29 NOTE — Telephone Encounter (Signed)
-----   Message from Carter Kitten, [place] sent at 12/29/2021 12:57 PM EDT ----- ?Debbie notified as instructed by telephone.  She has not taking any prescription medication for osteoporosis in the past but is agreeable to starting something like Fosamax.  Please advise.   ?

## 2022-01-03 ENCOUNTER — Other Ambulatory Visit: Payer: Self-pay | Admitting: Physician Assistant

## 2022-01-03 DIAGNOSIS — Z79899 Other long term (current) drug therapy: Secondary | ICD-10-CM

## 2022-01-05 DIAGNOSIS — Z23 Encounter for immunization: Secondary | ICD-10-CM | POA: Diagnosis not present

## 2022-01-16 NOTE — Progress Notes (Signed)
Remote ICD transmission.   

## 2022-01-19 ENCOUNTER — Encounter: Payer: Self-pay | Admitting: *Deleted

## 2022-01-19 DIAGNOSIS — Z006 Encounter for examination for normal comparison and control in clinical research program: Secondary | ICD-10-CM

## 2022-01-19 NOTE — Research (Signed)
Spoke with Ms [name] [name] about [employer] study. States she would like me to mail her more information. Mailed her a copy of the consent to review.  ?

## 2022-01-20 ENCOUNTER — Other Ambulatory Visit: Payer: Self-pay | Admitting: Family Medicine

## 2022-02-21 ENCOUNTER — Encounter: Payer: Self-pay | Admitting: *Deleted

## 2022-02-21 DIAGNOSIS — Z006 Encounter for examination for normal comparison and control in clinical research program: Secondary | ICD-10-CM

## 2022-02-21 NOTE — Research (Signed)
Message left for Ms Steptoe about Designer, multimedia. Encouraged her to call me back.

## 2022-03-30 ENCOUNTER — Ambulatory Visit (INDEPENDENT_AMBULATORY_CARE_PROVIDER_SITE_OTHER): Payer: Medicare Other

## 2022-03-30 DIAGNOSIS — I255 Ischemic cardiomyopathy: Secondary | ICD-10-CM

## 2022-03-30 LAB — CUP PACEART REMOTE DEVICE CHECK
Battery Voltage: 3.09 V
Brady Statistic RV Percent Paced: 0 %
Date Time Interrogation Session: 20230719075400
HighPow Impedance: 81 Ohm
Implantable Lead Implant Date: 20220721
Implantable Lead Location: 753860
Implantable Lead Model: 436909
Implantable Lead Serial Number: 81466093
Implantable Pulse Generator Implant Date: 20220721
Lead Channel Impedance Value: 991 Ohm
Lead Channel Pacing Threshold Amplitude: 0.4 V
Lead Channel Pacing Threshold Pulse Width: 0.4 ms
Lead Channel Sensing Intrinsic Amplitude: 20 mV
Lead Channel Sensing Intrinsic Amplitude: 4.6 mV
Lead Channel Setting Pacing Amplitude: 3 V
Lead Channel Setting Pacing Pulse Width: 0.4 ms
Lead Channel Setting Sensing Sensitivity: 0.8 mV
Pulse Gen Model: 429525
Pulse Gen Serial Number: 84852229

## 2022-03-31 ENCOUNTER — Other Ambulatory Visit
Admission: RE | Admit: 2022-03-31 | Discharge: 2022-03-31 | Disposition: A | Discharge status: Home / Self Care | Payer: Medicare Other | Attending: Internal Medicine | Admitting: Internal Medicine

## 2022-03-31 DIAGNOSIS — Z79899 Other long term (current) drug therapy: Secondary | ICD-10-CM | POA: Diagnosis not present

## 2022-03-31 DIAGNOSIS — E785 Hyperlipidemia, unspecified: Secondary | ICD-10-CM | POA: Diagnosis not present

## 2022-03-31 DIAGNOSIS — I5022 Chronic systolic (congestive) heart failure: Secondary | ICD-10-CM

## 2022-03-31 LAB — COMPREHENSIVE METABOLIC PANEL
ALT: 22 U/L (ref 0–44)
AST: 19 U/L (ref 15–41)
Albumin: 4.2 g/dL (ref 3.5–5.0)
Alkaline Phosphatase: 54 U/L (ref 38–126)
Anion gap: 10 (ref 5–15)
BUN: 13 mg/dL (ref 8–23)
CO2: 28 mmol/L (ref 22–32)
Calcium: 9.7 mg/dL (ref 8.9–10.3)
Chloride: 104 mmol/L (ref 98–111)
Creatinine, Ser: 0.69 mg/dL (ref 0.44–1.00)
GFR, Estimated: >60 mL/min (ref >60)
Glucose, Bld: 107 mg/dL — ABNORMAL HIGH (ref 70–99)
Potassium: 4.1 mmol/L (ref 3.5–5.1)
Sodium: 142 mmol/L (ref 135–145)
Total Bilirubin: 0.4 mg/dL (ref 0.3–1.2)
Total Protein: 7.4 g/dL (ref 6.5–8.1)

## 2022-03-31 LAB — LIPID PANEL
Cholesterol: 163 mg/dL (ref 0–200)
HDL: 37 mg/dL — ABNORMAL LOW (ref >40)
LDL Cholesterol: 68 mg/dL (ref 0–99)
Total CHOL/HDL Ratio: 4.4 RATIO
Triglycerides: 288 mg/dL — ABNORMAL HIGH (ref <150)
VLDL: 58 mg/dL — ABNORMAL HIGH (ref 0–40)

## 2022-04-21 NOTE — Progress Notes (Signed)
Remote ICD transmission.   

## 2022-04-25 ENCOUNTER — Telehealth: Payer: Self-pay | Admitting: Family Medicine

## 2022-04-25 NOTE — Telephone Encounter (Signed)
Placed this in your box

## 2022-04-25 NOTE — Telephone Encounter (Signed)
Pt came in office to drop off handicap card form for PCP to fill out. Forms are in provider's folder.

## 2022-04-26 NOTE — Telephone Encounter (Signed)
Called pt and let her know that form is ready for pick up. I informed her that it would be at the front office waiting for her.

## 2022-04-26 NOTE — Telephone Encounter (Signed)
Placed in Donna's box.

## 2022-05-21 NOTE — Progress Notes (Unsigned)
    [name] Rossano T. Majel Giel, MD, [place] at [facility] [place], [address]  Phone: [phone]  FAX: [place] - 71 y.o. female  MRN [id number]  Date of Birth: May 17, 1951  Date: 05/22/2022  PCP: Owens Loffler, MD  Referral: Owens Loffler, MD  No chief complaint on file.  Subjective:   [name] [name] is a 71 y.o. very pleasant female patient with There is no height or weight on file to calculate BMI. who presents with the following:  Pleasant patient presents for long-term medical follow-up for high blood pressure, high cholesterol, chronic coronary disease, chronic congestive heart failure.  She does have good follow-up with cardiology.  She does have a PCI to the LAD in August 2021 with ischemic cardiomyopathy, status post ICD placement (03/2021) -She is compliant with all of her cardiac medication is currently taking Coreg, Plavix, Farxiga, Lasix, Crestor, Entresto, and Aldactone.  She also has a history of ductal carcinoma in situ. Unilateral mammography done in March 2023.  Status post hysterectomy.  Lipids: Doing well, stable. Tolerating meds fine with no SE. Panel reviewed with patient.  Lipids: Lab Results  Component Value Date   CHOL 163 03/31/2022   Lab Results  Component Value Date   HDL 37 (L) 03/31/2022   Lab Results  Component Value Date   LDLCALC 68 03/31/2022   Lab Results  Component Value Date   TRIG 288 (H) 03/31/2022   Lab Results  Component Value Date   CHOLHDL 4.4 03/31/2022    Lab Results  Component Value Date   ALT 22 03/31/2022   AST 19 03/31/2022   ALKPHOS 54 03/31/2022   BILITOT 0.4 03/31/2022    HTN: Tolerating all medications without side effects Stable and at goal No CP, no sob. No HA.  BP Readings from Last 3 Encounters:  12/28/21 120/82  11/17/21 128/78  09/29/21 115/72    Basic Metabolic Panel:    Component Value Date/Time   NA  142 03/31/2022 0937   NA 143 12/22/2021 0900   K 4.1 03/31/2022 0937   CL 104 03/31/2022 0937   CO2 28 03/31/2022 0937   BUN 13 03/31/2022 0937   BUN 11 12/22/2021 0900   CREATININE 0.69 03/31/2022 0937   GLUCOSE 107 (H) 03/31/2022 0937   CALCIUM 9.7 03/31/2022 0937     Review of Systems is noted in the HPI, as appropriate  Objective:   There were no vitals taken for this visit.  GEN: No acute distress; alert,appropriate. PULM: Breathing comfortably in no respiratory distress PSYCH: Normally interactive.   Laboratory and Imaging Data:  Assessment and Plan:   ***

## 2022-05-22 ENCOUNTER — Encounter: Payer: Self-pay | Admitting: Family Medicine

## 2022-05-22 ENCOUNTER — Ambulatory Visit (INDEPENDENT_AMBULATORY_CARE_PROVIDER_SITE_OTHER): Payer: Medicare Other | Admitting: Family Medicine

## 2022-05-22 VITALS — BP 130/70 | HR 76 | Temp 98.2°F | Ht 64.5 in | Wt 209.4 lb

## 2022-05-22 DIAGNOSIS — Z23 Encounter for immunization: Secondary | ICD-10-CM

## 2022-05-22 DIAGNOSIS — E785 Hyperlipidemia, unspecified: Secondary | ICD-10-CM

## 2022-05-22 DIAGNOSIS — I5022 Chronic systolic (congestive) heart failure: Secondary | ICD-10-CM | POA: Diagnosis not present

## 2022-05-22 DIAGNOSIS — D0512 Intraductal carcinoma in situ of left breast: Secondary | ICD-10-CM | POA: Diagnosis not present

## 2022-05-22 DIAGNOSIS — I2510 Atherosclerotic heart disease of native coronary artery without angina pectoris: Secondary | ICD-10-CM | POA: Diagnosis not present

## 2022-05-22 DIAGNOSIS — I10 Essential (primary) hypertension: Secondary | ICD-10-CM | POA: Diagnosis not present

## 2022-05-22 DIAGNOSIS — I255 Ischemic cardiomyopathy: Secondary | ICD-10-CM | POA: Diagnosis not present

## 2022-05-24 ENCOUNTER — Encounter: Payer: Self-pay | Admitting: *Deleted

## 2022-05-24 DIAGNOSIS — Z006 Encounter for examination for normal comparison and control in clinical research program: Secondary | ICD-10-CM

## 2022-05-24 NOTE — Research (Signed)
Spoke with [name] [name] to remind her of her appointment tomorrow  with research, gave her the parking code an explained to her to be NPO. Voices understanding.

## 2022-05-25 ENCOUNTER — Other Ambulatory Visit: Payer: Self-pay

## 2022-05-25 ENCOUNTER — Encounter: Payer: Medicare Other | Admitting: *Deleted

## 2022-05-25 VITALS — BP 150/70 | HR 80 | Temp 97.9°F | Resp 16 | Ht 64.0 in | Wt 208.0 lb

## 2022-05-25 DIAGNOSIS — Z006 Encounter for examination for normal comparison and control in clinical research program: Secondary | ICD-10-CM

## 2022-05-25 NOTE — Progress Notes (Addendum)
Patient seen today for [name] screening.  Followed by Dr. Saunders Revel.  Has multiple cardiac issues, including CAD, LV dysfunction witth ICD, and hyperlipidemia.  Is on HI statin.  She is on multiple medications.  Prior cath includes stenting of the LAD and total occlusion of the RCA.  Triglycerides are chronically elevated in the 250-400 range.     Pleasant female in NAD accompanied by her sister. BP 150/70 P80 R16 02 sat is normal No JVD.  Lungs clear Cor reg Abd benign, no obvious masses, extremities without edema.   ECG NSR PVC and non specific lateral T wave changes (see End last ECG.  T wave inversion inferiorly.  ECG not significantly changed from 12/28/21  She meets inclusion and exclusion criteria and will screen for [name].  I recommended against doing the substudy since she has had recent cath, has CHF on multiple heart failure therapies.    Loretha Brasil. Lia Foyer, MD Medical Director, [facility]

## 2022-05-25 NOTE — Research (Signed)
[name] Consent     Subject Name: INCLUSION CRITERIA Consent      [x]    Pregnancy authorization []   AGE 71 or greater [x]   Triglycerides fasting 150 or greater with either: [x]   Dx of ASCVD (CAD, CVA, PAD) OR [x]   Increased risk for ASCVD as below []   Type 2 DM OR 2 or more below []   Men 65 or greater Woman 30 or greater []  [x]    Woman with Hx of preeclampsia or premature menopause (before 37) []    Family Hx of premature ASCDD (Before 24 for males, or before 79 for females  []    Current Tobacco use  []    Metabolic syndrome []    Hypertension with Treatment  [x]    CKD stage 3 Or (GFR 30-59)  []    LDL-C 160 or greater LDL-C 100 or greater on therapy to lower  []    Elevated high-sensitivity C Reactive protein (>2.0)  []   Elevated lipoprotein (a) (>27m/dL or 124nmol/L) OR  []   Triglycerides fasting 500 or greater  []   Lipid-lowering med (for at least 4 weeks) [place] to comply with diet and lifestyle recommendations  [x]   Females must be non-pregnant and non-lactating and EITHER  []   Surgically sterile, post-menopausal, abstinent OR Use highly effective contraceptive at time of consent until at least 30 weeks after last dose of study drug  [x]   Males must be surgical sterile, abstinent or using a highly effective contraceptive at time of consent until at least 30 weeks after the last dose of study drug   []     EXCLUSION CRITERIA           N/A                                            [x]  Major surgery, peripheral revascularization, or non-urgent PCI within 3 months prior to screening, or planned major surgery or major procedure during the study []   Active pancreatitis within 4 weeks prior to screening []   Acute coronary syndrome or CVA/TIA within 3 months of screening []   Screening labs: ALT or AST >3.0 x ULN Total bilirubin >1.5 ULN unless due to Gilbert's syndrome GFR >30 Urine Protein/creatine ratio >500 Uncontrolled HTN (BP>180/100 despite  Treatment Uncontrolled hypothyroidism TSH>1.5 and T4 < LLN, or Hormone therapy not stable for 4 weeks or greater  []  []  []  []  []  []   DM newly dx within 12 weeks of screening A1c > 9.5 at screening  []  []   Change in basal insulin >20% within 3 months prior to screening  []   Type 1 Dm: episode of DKA or > 3 episodes of severe hypo glycerides with on 6 months prior to screening   Active infections, HIV, Hep C, Hep B []   Active infection requiring systemic antiviral or antimicrobial tx that will not be complete prior to study day 1 or active Covid 19 infection not resolved by study day 1 []   Malignancy within 5 years (except for non-melanoma skin ca, cervical in situ ca, breast ductal ca in situ or stage 1 prostate Ca that has been tx []   Hypersensitivity to the active substance (olezarsen or placebo) []   Tx with another investigational drug or devise within 1 month or screening  []   Previous tx with an oligonucleotide within 4 months of screening []   Con meds/ procedure restrictions:   Systemic corticosteroids of anabolic steroids  within 6 weeks prior to screening and during the study unless approved   []   Use of bile acids resins (colestipol or Colesevelam) within 4 weeks prior to screening or planned during the study  []   Plasma apheresis within 4 weeks prior to screening or planned during the study  []   Change in meds known to exacerbate hypertriglyceridemia (beta blockers, thiazides, isotretinoin, oral antidiabetic meds, tamoxifen, estrogens or progestins within 4 weeks prior to screening  []    Change or expected need for significant change in titration of therapies known to significantly reduce TG (GLP-1 agonists, other incretin mimetics, Phentermine/topiramate, naltrexone/bupropion, Xenical, or bariatric surgery within 3 months prior to screening  []    Change in antipsychotic meds within 30 days of screening or >43m within 60 days of Screening  []    Blood or plasma donation  of 50-4915mwithin 30 days of screening or>499 within 60 days of screening []   Unwilling to comply with procedures, following up, or unwillingness to cooperate fully with the investigator []   ETOH abuse or recent (<1 year) or other substance abuse []        Subject met inclusion and exclusion criteria.  The informed consent form, study requirements and expectations were reviewed with the subject and questions and concerns were addressed prior to the signing of the consent form.  The subject verbalized understanding of the trial requirements.  The subject agreed to participate in the [name] trial and signed the informed consent at 09[place] 14-Sept-2023.  The informed consent was obtained prior to performance of any protocol-specific procedures for the subject.  A copy of the signed informed consent was given to the subject and a copy was placed in the subject's medical record.   ScBeverly [name]  Protocol number 1 Consent version  2  [name]  [id number]  Site [id number]  SUBJECT ID: [id number]                    DATE:  14-Sept-2023        []  FEMALE                            [x]  FEMALE AGE: ETHINICITY:   []  HISPANIC/LATINO      [x]  NON- HISPANIC/LATINO RACE:           [x]   WHITE             []  BLACK/AFRICAN AMERICAN                         []   ASIAN              []  AMERICAN INDIAN/[place] NATIVE                         []   NATIVE [place]/OTHER PACIFIC ISLANDER                         []   OTHER  FUTURE RESEARCH [x]  USE OF SAMPLES FOR FUTURE RESEARCH [x]  Consented for Sub study CTA [name]  [id number]  Site [id number]      Screening Qualification Visit   Subject Number: [id number]                      Date:14-Sept-2023    [x] Inclusion/Exclusion Criteria   [x] Collection of Hematology and Lipid Panel  [  x]Assessment of ER Visits, Hospitalization and Inpatient Days  [x] Adverse Events and Concomitant Medications VS taken at 0911 BP 150/70 HR 80 Temp 97.9 O2 sat 94% Resp 16 Wt 208 lbs Ht  5 ft 4 in   [name] here for [employer] run in.  Consent signed at [phone] after pt had time to review and ask questions.Pt sister at her side. Blood work drawn at 1001 and Urine obtained at 1019. GFR last >60, no contrast allergy or NTG contraindication, she is able to hold her breath greater than 6 sec. Dr Lia Foyer did exam and spoke with pt about CTa. Pt has decided not to go forward with the CTa. Next visit was scheduled for 06-09-22 at 0900.

## 2022-05-29 ENCOUNTER — Telehealth: Payer: Self-pay | Admitting: Internal Medicine

## 2022-05-29 DIAGNOSIS — Z79899 Other long term (current) drug therapy: Secondary | ICD-10-CM

## 2022-05-29 NOTE — Telephone Encounter (Signed)
*  STAT* If patient is at the pharmacy, call can be transferred to refill team.   1. Which medications need to be refilled? (please list name of each medication and dose if known)  dapagliflozin propanediol (FARXIGA) 10 MG TABS tablet carvedilol (COREG) 12.5 MG tablet sacubitril-valsartan (ENTRESTO) 97-103 MG furosemide (LASIX) 40 MG tablet   2. Which pharmacy/location (including street and city if local pharmacy) is medication to be sent to? [id number] [place] [place] - [address]  3. Do they need a 30 day or 90 day supply? 90 day  Patient is completely out of medication

## 2022-05-29 NOTE — Research (Addendum)
[facility] Screening run in 14-Sept-2023           CORE Abnormal Lab report Month Day, Year   Chemistry: Insulin 27.5                                '[]'$ Clinically Significant  '[x]'$ Not Clinically Significant    Hematology: Hematocrit 46  %                       '[]'$ Clinically Significant  '[x]'$ Not Clinically Significant   Urinalysis: Glucose   >1000    mg/dL          '[]'$ Clinically Significant  '[x]'$ Not Clinically Significant Protein 10 mg/dL                       '[]'$ Clinically Significant  '[x]'$ Not Clinically Significant Leukocyte Esterase 75              '[]'$ Clinically Significant  '[x]'$ Not Clinically Significant Amorphous present                   '[]'$ Clinically Significant  '[x]'$ Not Clinically Significant Mucus 1+ per HPF                    '[]'$ Clinically Significant  '[x]'$ Not Clinically Significant Squamous Epithelial Cells 1-2 per HPF  '[]'$ Clinically Significant  '[x]'$ Not Clinically Significant   Lipids:  Triglyceride   240   mg/dL             '[]'$ Clinically Significant  '[x]'$ Not Clinically Significant Apolipoprotein B 52.00 mg/dL      '[]'$ Clinically Significant  '[x]'$ Not Clinically Significant   Any further action needed to be taken per the PI? No  [name], MD, [facility], [place] Director of the Advanced Lipid Disorders &  Cardiovascular Risk Reduction Clinic Diplomate of the American Board of Clinical Lipidology Attending Cardiologist  Direct Dial: [phone]  Fax: [phone]  Website:  [email or web address]

## 2022-05-29 NOTE — Research (Addendum)
[name] [name] [name] Screening run in 14-Sept-2023    Apolipoprotein B48 4.84 mg/dL       [] Clinically Significant  [] Not Clinically Significant    Lipoprotein (a) Molar 237.9 nmol/L         [x] Clinically Significant  [] Not Clinically Significant  Any further action needed to be taken per the PI? No  LP(a) is significantly elevated - consider OCEAN A  Chrystie Nose, MD, [facility], FACP  [place]  [facility]  Medical Director of the Advanced Lipid Disorders &  Cardiovascular Risk Reduction Clinic Diplomate of the American Board of Clinical Lipidology Attending Cardiologist  Direct Dial: [phone]  Fax: [phone]  Website:  [email or web address]

## 2022-05-29 NOTE — Research (Signed)
If patient screen fails for [name] I will forward to [place] for [place] a.Thanks,

## 2022-05-30 MED ORDER — DAPAGLIFLOZIN PROPANEDIOL 10 MG PO TABS: 10.0000 mg | ORAL_TABLET | Freq: Every day | ORAL | 0 refills | Status: DC

## 2022-05-30 MED ORDER — FUROSEMIDE 40 MG PO TABS: 20.0000 mg | ORAL_TABLET | Freq: Every day | ORAL | 0 refills | Status: DC

## 2022-05-30 MED ORDER — ENTRESTO 97-103 MG PO TABS: 1.0000 | ORAL_TABLET | Freq: Two times a day (BID) | ORAL | 0 refills | Status: DC

## 2022-05-30 MED ORDER — CARVEDILOL 12.5 MG PO TABS: ORAL_TABLET | ORAL | 0 refills | Status: DC

## 2022-05-30 NOTE — Telephone Encounter (Signed)
Good Morning,   Could you please schedule a 6 month follow up visit? The patient was last seen by Dr. Saunders Revel on 12-28-21. Thank you so much.

## 2022-05-30 NOTE — Telephone Encounter (Signed)
Ok to refill Farxiga 10 mg once daily # 90 w/ no refills.  Please note on the RX that the patient needs a follow up prior to more refills.  Thank you!

## 2022-06-06 NOTE — Research (Cosign Needed Addendum)
   Not clinically significant.  Chrystie Nose, MD, [facility], FACP  [place]  [facility]  Medical Director of the Advanced Lipid Disorders &  Cardiovascular Risk Reduction Clinic Diplomate of the American Board of Clinical Lipidology Attending Cardiologist  Direct Dial: [phone]  Fax: [phone]  Website:  [email or web address]     D[facility] [name] Screening run in 14-Sept-2023  LDL-C 44 mg/dL  [] Clinically Significant  [x] Not Clinically Significant   LDL-C 44 mg/dL                 [] Clinically Significant  [x] Not Clinically Significant  Any further action needed to be taken per the PI?  No  Chrystie Nose, MD, [facility], FACP  [place]  [facility]  Medical Director of the Advanced Lipid Disorders &  Cardiovascular Risk Reduction Clinic Diplomate of the American Board of Clinical Lipidology Attending Cardiologist  Direct Dial: [phone]  Fax: [phone]  Website:  [email or web address]

## 2022-06-07 ENCOUNTER — Ambulatory Visit: Payer: Medicare Other | Attending: Cardiology | Admitting: Cardiology

## 2022-06-07 ENCOUNTER — Encounter: Payer: Self-pay | Admitting: Cardiology

## 2022-06-07 VITALS — BP 144/92 | HR 60 | Ht 64.0 in | Wt 208.4 lb

## 2022-06-07 DIAGNOSIS — Z95810 Presence of automatic (implantable) cardiac defibrillator: Secondary | ICD-10-CM | POA: Diagnosis not present

## 2022-06-07 DIAGNOSIS — I5022 Chronic systolic (congestive) heart failure: Secondary | ICD-10-CM | POA: Insufficient documentation

## 2022-06-07 MED ORDER — DAPAGLIFLOZIN PROPANEDIOL 10 MG PO TABS: 10.0000 mg | ORAL_TABLET | Freq: Every day | ORAL | 2 refills | Status: DC

## 2022-06-07 NOTE — Progress Notes (Signed)
Electrophysiology Office Follow up Visit Note:    Date:  06/07/2022   ID:  [name] [name], DOB 1950/10/24, MRN [id number]  PCP:  [name] Loffler, MD  [facility] Cardiologist:  [name] Bush, MD  [facility] Electrophysiologist:  [name] Epley, MD    Interval History:    [name] [name] is a 71 y.o. female who presents for a follow up visit. They were last seen in clinic July 13, 2021 for her chronic systolic heart failure and ICD implant.  Her ICD was implanted March 31, 2021.  Remote monitoring since implant has shown stable device function.  She tells me that she is overall doing quite well.  She stays very active.  She has a small dog that she walks frequently.     Past Medical History:  Diagnosis Date   Allergic rhinitis    Breast cancer ([place]) 2004   Left Breast Cancer   CHF (congestive heart failure) ([place])    Coronary artery disease    a. 10/2012 ETT: nl; b. 12/2012 Cath: LM nl, LAD min irregs, D1 min irregs, LCX 30p, RCA 71m RPDA 40p, EF 50%.   Diastolic dysfunction    a. 12/2012 Echo: EF 50-55%. No rwma. Gr1 DD. Nl RV fxn. Nl PASP.   History of hepatitis B    ?--tested + by [facility]   HLD (hyperlipidemia)    HTN (hypertension)    Hyperlipidemia associated with type 2 diabetes mellitus ([place] 12/04/2020   Osteoarthritis    Osteopenia     Past Surgical History:  Procedure Laterality Date   ABDOMINAL HYSTERECTOMY  1985   endometriosis; anatomy   BREAST SURGERY  10/2002   CARDIAC CATHETERIZATION  12/19/12   [place] 50%   COLONOSCOPY  8/09   Hyperplastic polyp; repeat in 10 years   CORONARY STENT INTERVENTION N/A 05/03/2020   Procedure: CORONARY STENT INTERVENTION;  Surgeon: ENelva Bush MD;  [place];  Service: Cardiovascular;  Laterality: N/A;   HIP FRACTURE SURGERY  06/14/2011   ball replaced (Dr. REarvin Hansen   ICD IMPLANT N/A 03/31/2021   Procedure: ICD IMPLANT;  Surgeon: LVickie Epley MD;  [place];   Service: Cardiovascular;  Laterality: N/A;   INTRAVASCULAR ULTRASOUND/IVUS N/A 05/03/2020   Procedure: Intravascular Ultrasound/IVUS;  Surgeon: ENelva Bush MD;  [place];  Service: Cardiovascular;  Laterality: N/A;   LEFT HEART CATH N/A 05/03/2020   Procedure: Left Heart Cath;  Surgeon: ENelva Bush MD;  [place] [facility];  Service: Cardiovascular;  Laterality: N/A;   MASTECTOMY Left 2004   RIGHT/LEFT HEART CATH AND CORONARY ANGIOGRAPHY N/A 12/02/2019   Procedure: RIGHT/LEFT HEART CATH AND CORONARY ANGIOGRAPHY;  Surgeon: ENelva Bush MD;  [place] [facility];  Service: Cardiovascular;  Laterality: N/A;   TONSILLECTOMY  1958    Current Medications: Current Meds  Medication Sig   acetaminophen (TYLENOL) 650 MG CR tablet Take 1,300 mg by mouth every 8 (eight) hours as needed for pain.   alendronate (FOSAMAX) 70 MG tablet Take 1 tablet (70 mg total) by mouth every 7 (seven) days. Take with a full glass of water on an empty stomach.   bismuth subsalicylate (PEPTO BISMOL) 262 MG/15ML suspension Take 30 mLs by mouth every 6 (six) hours as needed for indigestion or diarrhea or loose stools.   Calcium Carbonate-Vitamin D 600-400 MG-UNIT tablet Take 1 tablet by mouth daily.    carvedilol (COREG) 12.5 MG tablet TAKE 1 TABLET (  12.5 MG TOTAL) BY MOUTH 2 (TWO) TIMES DAILY WITH A MEAL. DOSAGE INCREASE   cetirizine (ZYRTEC) 10 MG tablet Take 10 mg by mouth daily as needed for allergies.   clopidogrel (PLAVIX) 75 MG tablet TAKE 1 TABLET BY MOUTH EVERY DAY   dapagliflozin propanediol (FARXIGA) 10 MG TABS tablet Take 1 tablet (10 mg total) by mouth daily. PLEASE CALL [phone] TO SCHEDULE AN APPOINTMENT FOR FURTHER REFILLS   FLUoxetine (PROZAC) 20 MG capsule Take 1 capsule by mouth once daily   fluticasone (FLONASE) 50 MCG/ACT nasal spray Place 2 sprays into both nostrils daily.   furosemide (LASIX) 40 MG tablet Take 0.5 tablets (20 mg total) by mouth  daily.   Glycerin-Hypromellose-PEG 400 (DRY EYE RELIEF DROPS) 0.2-0.2-1 % SOLN Place 1 drop into both eyes daily as needed (Dry eye).   Grape Seed Extract 60 MG CAPS Take 60 mg by mouth 2 (two) times daily.   Misc Natural Products (OSTEO BI-FLEX ADV JOINT SHIELD) TABS Take 1 tablet by mouth 2 (two) times daily.   Multiple Vitamin (MULTIVITAMIN WITH MINERALS) TABS tablet Take 1 tablet by mouth daily.   Multiple Vitamins-Minerals (EYE VITAMINS) CAPS Take 1 capsule by mouth daily. Vision Shield   pseudoephedrine-acetaminophen (TYLENOL SINUS) 30-500 MG TABS tablet Take 2 tablets by mouth every 4 (four) hours as needed (Sinus).   rosuvastatin (CRESTOR) 40 MG tablet Take 1 tablet (40 mg total) by mouth daily.   sacubitril-valsartan (ENTRESTO) 97-103 MG Take 1 tablet by mouth 2 (two) times daily.   spironolactone (ALDACTONE) 25 MG tablet Take 0.5 tablets (12.5 mg total) by mouth daily.     Allergies:   Codeine, Elemental sulfur, Morphine, Ace inhibitors, and Sulfonamide derivatives   Social History   Socioeconomic History   Marital status: Widowed    Spouse name: (Widow) to [name] [name]   Number of children: Not on file   Years of education: Not on file   Highest education level: Not on file  Occupational History   Not on file  Tobacco Use   Smoking status: Former    Packs/day: 0.50    Years: 10.00    Total pack years: 5.00    Types: Cigarettes    Passive exposure: Past   Smokeless tobacco: Former    Quit date: 01/24/1986   Tobacco comments:    1989 quit   Vaping Use   Vaping Use: Never used  Substance and Sexual Activity   Alcohol use: No   Drug use: No   Sexual activity: Not on file  Other Topics Concern   Not on file  Social History Narrative   Widowed to husband [name] [name]      No regular exercise   Lives locally w/ tenants that help her w/ chores around the house.   Social Determinants of Health   Financial Resource Strain: Low Risk  (11/10/2021)   Overall Financial  Resource Strain (CARDIA)    Difficulty of Paying Living Expenses: Not hard at all  Food Insecurity: No Food Insecurity (11/10/2021)   Hunger Vital Sign    Worried About Running Out of Food in the Last Year: Never true    Ran Out of Food in the Last Year: Never true  Transportation Needs: No Transportation Needs (11/10/2021)   PRAPARE - Hydrologist (Medical): No    Lack of Transportation (Non-Medical): No  Physical Activity: Sufficiently Active (11/10/2021)   Exercise Vital Sign    Days of Exercise per Week:  7 days    Minutes of Exercise per Session: 30 min  Stress: No Stress Concern Present (11/10/2021)   [place]    Feeling of Stress : Not at all  Social Connections: Moderately Integrated (11/10/2021)   Social Connection and Isolation Panel [NHANES]    Frequency of Communication with Friends and Family: Three times a week    Frequency of Social Gatherings with Friends and Family: More than three times a week    Attends Religious Services: More than 4 times per year    Active Member of Clubs or Organizations: No    Attends Music therapist: More than 4 times per year    Marital Status: Widowed     Family History: The patient's family history includes Breast cancer (age of onset: 66) in her sister; Cancer in her paternal aunt and sister; Diabetes in an other family member; Heart attack (age of onset: 51) in her father; Heart disease in her father; Ovarian cancer in an other family member; Sarcoidosis in her mother.  ROS:   Please see the history of present illness.    All other systems reviewed and are negative.  EKGs/Labs/Other Studies Reviewed:    The following studies were reviewed today:  June 07, 2022 in clinic device interrogation personally reviewed Battery longevity 100% Lead parameters stable Changed lead output to maximize battery longevity today No high-voltage  therapies delivered 0% pacing    Recent Labs: 09/29/2021: Hemoglobin 16.1; Platelets 350; TSH 2.730 03/31/2022: ALT 22; BUN 13; Creatinine, Ser 0.69; Potassium 4.1; Sodium 142  Recent Lipid Panel    Component Value Date/Time   CHOL 163 03/31/2022 0937   CHOL 154 12/22/2021 0900   TRIG 288 (H) 03/31/2022 0937   HDL 37 (L) 03/31/2022 0937   HDL 36 (L) 12/22/2021 0900   CHOLHDL 4.4 03/31/2022 0937   VLDL 58 (H) 03/31/2022 0937   LDLCALC 68 03/31/2022 0937   LDLCALC 74 12/22/2021 0900   LDLDIRECT 63.9 10/06/2021 1147    Physical Exam:    VS:  BP (!) 144/92 (BP Location: Left Arm, Patient Position: Sitting, Cuff Size: Large)   Pulse 60   Ht '5\' 4"'$  (1.626 m)   Wt 208 lb 6.4 oz (94.5 kg)   SpO2 94%   BMI 35.77 kg/m     Wt Readings from Last 3 Encounters:  06/07/22 208 lb 6.4 oz (94.5 kg)  05/25/22 208 lb (94.3 kg)  05/22/22 209 lb 6 oz (95 kg)     GEN:  Well nourished, well developed in no acute distress HEENT: Normal NECK: No JVD; No carotid bruits LYMPHATICS: No lymphadenopathy CARDIAC: RRR, no murmurs, rubs, gallops.Device pocket well-healed RESPIRATORY:  Clear to auscultation without rales, wheezing or rhonchi  ABDOMEN: Soft, non-tender, non-distended MUSCULOSKELETAL:  No edema; No deformity  SKIN: Warm and dry NEUROLOGIC:  Alert and oriented x 3 PSYCHIATRIC:  Normal affect        ASSESSMENT:    1. Chronic HFrEF (heart failure with reduced ejection fraction) ([place])   2. ICD (implantable cardioverter-defibrillator) in place    PLAN:    In order of problems listed above:   #Chronic systolic heart failure NYHA class I Continue current medications including Coreg, Farxiga, Entresto, spironolactone.  #ICD in situ Device functioning appropriately.  Continue remote monitoring.  Follow-up 1 year with APP.    Medication Adjustments/Labs and Tests Ordered: Current medicines are reviewed at length with the patient today.  Concerns regarding medicines  are  outlined above.  No orders of the defined types were placed in this encounter.  No orders of the defined types were placed in this encounter.    Signed, Lars Mage, MD, [facility], [facility] 06/07/2022 10:09 AM    Electrophysiology [facility]

## 2022-06-07 NOTE — Patient Instructions (Signed)
Medication Instructions:  No changes *If you need a refill on your cardiac medications before your next appointment, please call your pharmacy*   Lab Work: None  If you have labs (blood work) drawn today and your tests are completely normal, you will receive your results only by: [place] (if you have MyChart) OR A paper copy in the mail If you have any lab test that is abnormal or we need to change your treatment, we will call you to review the results.   Testing/Procedures: none   Follow-Up: At [facility], you and your health needs are our priority.  As part of our continuing mission to provide you with exceptional heart care, we have created designated Provider Care Teams.  These Care Teams include your primary Cardiologist (physician) and Advanced Practice Providers (APPs -  Physician Assistants and Nurse Practitioners) who all work together to provide you with the care you need, when you need it.  We recommend signing up for the patient portal called "MyChart".  Sign up information is provided on this After Visit Summary.  MyChart is used to connect with patients for Virtual Visits (Telemedicine).  Patients are able to view lab/test results, encounter notes, upcoming appointments, etc.  Non-urgent messages can be sent to your provider as well.   To learn more about what you can do with MyChart, go to [email or web address].    Your next appointment:   1 month(s)  The format for your next appointment:   In Person  Provider:   [name], MD   1 yr with Dr. Quentin Ore Other Instructions none  Important Information About Sugar

## 2022-06-08 ENCOUNTER — Encounter: Payer: Self-pay | Admitting: *Deleted

## 2022-06-08 ENCOUNTER — Other Ambulatory Visit: Payer: Self-pay | Admitting: Internal Medicine

## 2022-06-08 ENCOUNTER — Other Ambulatory Visit: Payer: Self-pay | Admitting: Student

## 2022-06-08 DIAGNOSIS — Z006 Encounter for examination for normal comparison and control in clinical research program: Secondary | ICD-10-CM

## 2022-06-08 NOTE — Research (Signed)
Message left for [name] [name] to remind her of her appointment tomorrow, gave parking code . And reminded to be NPO

## 2022-06-09 ENCOUNTER — Other Ambulatory Visit: Payer: Self-pay

## 2022-06-09 ENCOUNTER — Encounter: Payer: Self-pay | Admitting: *Deleted

## 2022-06-09 ENCOUNTER — Encounter: Payer: Medicare Other | Admitting: *Deleted

## 2022-06-09 VITALS — BP 134/73 | HR 80 | Temp 97.8°F | Resp 18

## 2022-06-09 DIAGNOSIS — Z006 Encounter for examination for normal comparison and control in clinical research program: Secondary | ICD-10-CM

## 2022-06-09 NOTE — Research (Signed)
Screening Qualification Visit   Subject Number: Q676                        Date: 29-Sept-2023   '[x]'$ Inclusion/Exclusion Criteria   '[]'$ Pregnancy Test (if applicable)  '[x]'$ Collection of Hematology and Lipid Panel  '[x]'$ Assessment of ER Visits, Hospitalization and Inpatient Days  '[x]'$ Adverse Events and Concomitant Medications  Ms Neu here for [name] Qualification visit.  She reports no abd pain, no visits to the Ed or Urgent care since last seen. No med changes. Blood drawn at 0903.  VS taken at 0858 BP 134/73  HR 80 Resp 18  Temp 97.8  O2 sat 93%   Current Outpatient Medications:    acetaminophen (TYLENOL) 650 MG CR tablet, Take 1,300 mg by mouth every 8 (eight) hours as needed for pain., Disp: , Rfl:    alendronate (FOSAMAX) 70 MG tablet, Take 1 tablet (70 mg total) by mouth every 7 (seven) days. Take with a full glass of water on an empty stomach., Disp: 12 tablet, Rfl: 3   bismuth subsalicylate (PEPTO BISMOL) 262 MG/15ML suspension, Take 30 mLs by mouth every 6 (six) hours as needed for indigestion or diarrhea or loose stools., Disp: , Rfl:    Calcium Carbonate-Vitamin D 600-400 MG-UNIT tablet, Take 1 tablet by mouth daily. , Disp: , Rfl:    carvedilol (COREG) 12.5 MG tablet, TAKE 1 TABLET (12.5 MG TOTAL) BY MOUTH 2 (TWO) TIMES DAILY WITH A MEAL. DOSAGE INCREASE, Disp: 180 tablet, Rfl: 0   cetirizine (ZYRTEC) 10 MG tablet, Take 10 mg by mouth daily as needed for allergies., Disp: , Rfl:    clopidogrel (PLAVIX) 75 MG tablet, TAKE 1 TABLET BY MOUTH EVERY DAY, Disp: 90 tablet, Rfl: 3   dapagliflozin propanediol (FARXIGA) 10 MG TABS tablet, Take 1 tablet (10 mg total) by mouth daily. PLEASE CALL [phone] TO SCHEDULE AN APPOINTMENT FOR FURTHER REFILLS, Disp: 90 tablet, Rfl: 2   FLUoxetine (PROZAC) 20 MG capsule, Take 1 capsule by mouth once daily, Disp: 90 capsule, Rfl: 1   fluticasone (FLONASE) 50 MCG/ACT nasal spray, Place 2 sprays into both nostrils daily., Disp: 16  g, Rfl: 6   furosemide (LASIX) 40 MG tablet, Take 0.5 tablets (20 mg total) by mouth daily., Disp: 45 tablet, Rfl: 0   Glycerin-Hypromellose-PEG 400 (DRY EYE RELIEF DROPS) 0.2-0.2-1 % SOLN, Place 1 drop into both eyes daily as needed (Dry eye)., Disp: , Rfl:    Grape Seed Extract 60 MG CAPS, Take 60 mg by mouth 2 (two) times daily., Disp: , Rfl:    Misc Natural Products (OSTEO BI-FLEX ADV JOINT SHIELD) TABS, Take 1 tablet by mouth 2 (two) times daily., Disp: , Rfl:    Multiple Vitamin (MULTIVITAMIN WITH MINERALS) TABS tablet, Take 1 tablet by mouth daily., Disp: , Rfl:    Multiple Vitamins-Minerals (EYE VITAMINS) CAPS, Take 1 capsule by mouth daily. Vision Shield, Disp: , Rfl:    pseudoephedrine-acetaminophen (TYLENOL SINUS) 30-500 MG TABS tablet, Take 2 tablets by mouth every 4 (four) hours as needed (Sinus)., Disp: , Rfl:    rosuvastatin (CRESTOR) 40 MG tablet, Take 1 tablet (40 mg total) by mouth daily., Disp: 90 tablet, Rfl: 3   sacubitril-valsartan (ENTRESTO) 97-103 MG, Take 1 tablet by mouth 2 (two) times daily., Disp: 180 tablet, Rfl: 0   spironolactone (ALDACTONE) 25 MG tablet, Take 0.5 tablets (12.5 mg total) by mouth daily., Disp: 90 tablet, Rfl: 3

## 2022-06-12 NOTE — Research (Addendum)
[name] qualification lab results         ABNORMAL LABS: TEST RESULT  triglycerides 249  hematocrit 48         Are these clinically significant? '[]'$  YES               '[x]'$ NO   [name], MD, [facility], [place] Director of the Advanced Lipid Disorders &  Cardiovascular Risk Reduction Clinic Diplomate of the American Board of Clinical Lipidology Attending Cardiologist  Direct Dial: [phone]  Fax: [phone]  Website:  [email or web address]

## 2022-06-13 NOTE — Research (Addendum)
[name]  QUALIFICATION LAB RESULTS  S926   ABNORMAL LABS: TEST RESULT  Apolipoprotein B 50.80            Are these clinically significant? '[]'$  YES              '[x]'$  NO   [name], MD, [facility], [place] Director of the Advanced Lipid Disorders &  Cardiovascular Risk Reduction Clinic Diplomate of the American Board of Clinical Lipidology Attending Cardiologist  Direct Dial: [phone]  Fax: [phone]  Website:  [email or web address]

## 2022-06-14 ENCOUNTER — Telehealth: Payer: Self-pay | Admitting: Internal Medicine

## 2022-06-14 NOTE — Research (Signed)
[name] Qualification lab results   [phone]

## 2022-06-14 NOTE — Telephone Encounter (Signed)
   Pre-operative Risk Assessment    Patient Name: [name] [name]  DOB: 06-07-1951 MRN: [id number]     Request for Surgical Clearance    Procedure:  Cleaning and Extraction  Date of Surgery:  Clearance TBD                                 Surgeon:  Dr. Martinique Thomas Surgeon's Group or Practice Name:  [facility] [facility] Phone number:  [phone] Fax number: [phone]   Type of Clearance Requested:  Both    Type of Anesthesia:  Local    Additional requests/questions:    Signed, Pilar A Ham   06/14/2022, 9:33 AM

## 2022-06-14 NOTE — Telephone Encounter (Signed)
   Primary Cardiologist: [name], MD  Chart reviewed as part of pre-operative protocol coverage. Simple dental extractions are considered low risk procedures per guidelines and generally do not require any specific cardiac clearance. It is also generally accepted that for simple extractions and dental cleanings, there is no need to interrupt blood thinner therapy.   SBE prophylaxis is not required for the patient.  I will route this recommendation to the requesting party via Epic fax function and remove from pre-op pool.  Please call with questions.  Emmaline Life, NP-C  06/14/2022, 10:19 AM [address], [address] Office [phone] Fax [phone]

## 2022-06-19 DIAGNOSIS — Z1283 Encounter for screening for malignant neoplasm of skin: Secondary | ICD-10-CM | POA: Diagnosis not present

## 2022-06-19 DIAGNOSIS — D2271 Melanocytic nevi of right lower limb, including hip: Secondary | ICD-10-CM | POA: Diagnosis not present

## 2022-06-19 DIAGNOSIS — D2262 Melanocytic nevi of left upper limb, including shoulder: Secondary | ICD-10-CM | POA: Diagnosis not present

## 2022-06-19 DIAGNOSIS — D2272 Melanocytic nevi of left lower limb, including hip: Secondary | ICD-10-CM | POA: Diagnosis not present

## 2022-06-19 DIAGNOSIS — D2261 Melanocytic nevi of right upper limb, including shoulder: Secondary | ICD-10-CM | POA: Diagnosis not present

## 2022-06-19 DIAGNOSIS — D0359 Melanoma in situ of other part of trunk: Secondary | ICD-10-CM | POA: Diagnosis not present

## 2022-06-19 DIAGNOSIS — D485 Neoplasm of uncertain behavior of skin: Secondary | ICD-10-CM | POA: Diagnosis not present

## 2022-06-19 DIAGNOSIS — D225 Melanocytic nevi of trunk: Secondary | ICD-10-CM | POA: Diagnosis not present

## 2022-06-19 DIAGNOSIS — L821 Other seborrheic keratosis: Secondary | ICD-10-CM | POA: Diagnosis not present

## 2022-06-21 ENCOUNTER — Encounter: Payer: Self-pay | Admitting: *Deleted

## 2022-06-21 DIAGNOSIS — Z006 Encounter for examination for normal comparison and control in clinical research program: Secondary | ICD-10-CM

## 2022-06-21 NOTE — Research (Signed)
Message left for [name] [name] to remind her of her appointment at 0900. Reminded to be NPO and gave parking code.

## 2022-06-22 ENCOUNTER — Other Ambulatory Visit: Payer: Self-pay

## 2022-06-22 ENCOUNTER — Encounter: Payer: Self-pay | Admitting: *Deleted

## 2022-06-22 ENCOUNTER — Encounter: Payer: Medicare Other | Admitting: *Deleted

## 2022-06-22 VITALS — BP 104/81 | HR 70 | Temp 97.6°F | Resp 16 | Wt 207.8 lb

## 2022-06-22 DIAGNOSIS — Z006 Encounter for examination for normal comparison and control in clinical research program: Secondary | ICD-10-CM

## 2022-06-22 MED ORDER — STUDY - ESSENCE - OLEZARSEN 50 MG, 80 MG OR PLACEBO SQ INJECTION (PI-HILTY)
80.0000 mg | INJECTION | Freq: Once | SUBCUTANEOUS | Status: AC
  Administered 2022-06-22: 80 mg via SUBCUTANEOUS
  Filled 2022-06-22: qty 0.8

## 2022-06-22 NOTE — Research (Signed)
TREATMENT DAY 1 - STUDY WEEK 1    Subject Number: [id number]            Randomization Number:[id number]            Date: 22-Jun-2022      '[x]'$ Vital Signs Collected - Blood Pressure: - Height:5 ft 4 in - Weight: 207.8 lbs - Heart Rate:70 - Respiratory Rate:16 - Temperature: 97.6 - Oxygen Saturation: 95%  '[x]'$  Physical Exam Completed by PI or SUB-I  '[x]'$  12-lead ECG  '[]'$  Pregnancy Test (If applicable)  '[x]'$  Extended Urinalysis   '[x]'$  Lab collection per protocol  '[x]'$   (abdominal pain only) since last visit  '[x]'$  Assessment of ER Visits, Hospitalizations, and Inpatient Days  '[x]'$  Adverse Events and Concomitant Medications  '[x]'$  Diet, Lifestyle, and Alcohol Counseling   '[x]'$  Study Drug: [place] Injection    Ms [place] here for Week 1 Day 1 of [name] research. She reports no abd pain, no visits to the ED or Urgent care. Dr Debara Pickett in to see pt.  Vs taken at 0909, Urine obtained at 0924, Blood work 0955. Injection given at 1002 in right lower abd. Tol well  Reports no medication changes.   Current Outpatient Medications:    acetaminophen (TYLENOL) 650 MG CR tablet, Take 1,300 mg by mouth every 8 (eight) hours as needed for pain., Disp: , Rfl:    alendronate (FOSAMAX) 70 MG tablet, Take 1 tablet (70 mg total) by mouth every 7 (seven) days. Take with a full glass of water on an empty stomach., Disp: 12 tablet, Rfl: 3   bismuth subsalicylate (PEPTO BISMOL) 262 MG/15ML suspension, Take 30 mLs by mouth every 6 (six) hours as needed for indigestion or diarrhea or loose stools., Disp: , Rfl:    Calcium Carbonate-Vitamin D 600-400 MG-UNIT tablet, Take 1 tablet by mouth daily. , Disp: , Rfl:    carvedilol (COREG) 12.5 MG tablet, TAKE 1 TABLET (12.5 MG TOTAL) BY MOUTH 2 (TWO) TIMES DAILY WITH A MEAL. DOSAGE INCREASE, Disp: 180 tablet, Rfl: 0   cetirizine (ZYRTEC) 10 MG tablet, Take 10 mg by mouth daily as needed for allergies., Disp: , Rfl:    clopidogrel (PLAVIX) 75 MG tablet, TAKE 1 TABLET BY MOUTH  EVERY DAY, Disp: 90 tablet, Rfl: 3   dapagliflozin propanediol (FARXIGA) 10 MG TABS tablet, Take 1 tablet (10 mg total) by mouth daily. PLEASE CALL [phone] TO SCHEDULE AN APPOINTMENT FOR FURTHER REFILLS, Disp: 90 tablet, Rfl: 2   FLUoxetine (PROZAC) 20 MG capsule, Take 1 capsule by mouth once daily, Disp: 90 capsule, Rfl: 1   fluticasone (FLONASE) 50 MCG/ACT nasal spray, Place 2 sprays into both nostrils daily., Disp: 16 g, Rfl: 6   furosemide (LASIX) 40 MG tablet, Take 0.5 tablets (20 mg total) by mouth daily., Disp: 45 tablet, Rfl: 0   Glycerin-Hypromellose-PEG 400 (DRY EYE RELIEF DROPS) 0.2-0.2-1 % SOLN, Place 1 drop into both eyes daily as needed (Dry eye)., Disp: , Rfl:    Grape Seed Extract 60 MG CAPS, Take 60 mg by mouth 2 (two) times daily., Disp: , Rfl:    Misc Natural Products (OSTEO BI-FLEX ADV JOINT SHIELD) TABS, Take 1 tablet by mouth 2 (two) times daily., Disp: , Rfl:    Multiple Vitamin (MULTIVITAMIN WITH MINERALS) TABS tablet, Take 1 tablet by mouth daily., Disp: , Rfl:    Multiple Vitamins-Minerals (EYE VITAMINS) CAPS, Take 1 capsule by mouth daily. Vision Shield, Disp: , Rfl:    pseudoephedrine-acetaminophen (TYLENOL SINUS) 30-500 MG  TABS tablet, Take 2 tablets by mouth every 4 (four) hours as needed (Sinus)., Disp: , Rfl:    rosuvastatin (CRESTOR) 40 MG tablet, Take 1 tablet (40 mg total) by mouth daily., Disp: 90 tablet, Rfl: 3   sacubitril-valsartan (ENTRESTO) 97-103 MG, Take 1 tablet by mouth 2 (two) times daily., Disp: 180 tablet, Rfl: 0   spironolactone (ALDACTONE) 25 MG tablet, Take 0.5 tablets (12.5 mg total) by mouth daily., Disp: 90 tablet, Rfl: 3   Study - [name] - olezarsen 50 mg, 80 mg or placebo SQ injection (PI-[place]), Inject 80 mg into the skin once. For Investigational Use Only. Injection subcutaneously in protocol approved injection sites (abdomen, thigh or outer area of upper arm). Every 4 weeks. Please contact [facility] for any questions or concerns  regarding this medication., Disp: , Rfl:

## 2022-06-26 NOTE — Research (Cosign Needed)
[name] [name] [name] Qualification 29-Sept-2023   LDL-C 37 mg/dL              '[]'$ Clinically Significant  '[x]'$ Not Clinically Significant  Any further action needed to be taken per the PI?  No  [name], MD, [facility], [place] Director of the Advanced Lipid Disorders &  Cardiovascular Risk Reduction Clinic Diplomate of the American Board of Clinical Lipidology Attending Cardiologist  Direct Dial: [phone]  Fax: [phone]  Website:  [email or web address]

## 2022-06-26 NOTE — Research (Cosign Needed)
[name] Week 1 Day 1 22-Jun-2022          Chemistry: Hs-C-Reactive Protein 5.4 mg/L        '[x]'$ Clinically Significant  '[]'$ Not Clinically Significant    Urinalysis: Glucose >1000    mg/dL                  '[]'$ Clinically Significant  '[x]'$ Not Clinically Significant Protein 10 mg/dL                             '[]'$ Clinically Significant  '[x]'$ Not Clinically Significant Leukocyte Esterase 75                    '[]'$ Clinically Significant  '[x]'$ Not Clinically Significant Urinary White Cells 10-15 per HPF '[]'$ Clinically Significant  '[x]'$ Not Clinically Significant Bacteria 2+ per HPF                        '[]'$ Clinically Significant  '[x]'$ Not Clinically Significant Uric Acid Crystals present               '[]'$ Clinically Significant  '[x]'$ Not Clinically Significant Squamous Epithelial Cells 3-5 per HPF  '[]'$ Clinically Significant  '[x]'$ Not Clinically Significant Yeast 1+ per HPF                      '[]'$ Clinically Significant  '[x]'$ Not Clinically Significant               Lipids:  Triglyceride 244 mg/dL                      '[]'$ Clinically Significant  '[x]'$ Not Clinically Significant Apolipoprotein B  51.50 mg/dL          '[]'$ Clinically Significant  '[x]'$ Not Clinically Significant    HS-CRP is high, suggesting active inflammation, although unclear source, probably not related to study drug.  [name], MD, [facility], [place] Director of the Advanced Lipid Disorders &  Cardiovascular Risk Reduction Clinic Diplomate of the American Board of Clinical Lipidology Attending Cardiologist  Direct Dial: [phone]  Fax: [phone]  Website:  [email or web address]

## 2022-06-28 DIAGNOSIS — I255 Ischemic cardiomyopathy: Secondary | ICD-10-CM | POA: Diagnosis not present

## 2022-06-28 LAB — CUP PACEART REMOTE DEVICE CHECK
Date Time Interrogation Session: 20231013103730
Implantable Lead Implant Date: 20220721
Implantable Lead Location: 753860
Implantable Lead Model: 436909
Implantable Lead Serial Number: 81466093
Implantable Pulse Generator Implant Date: 20220721
Pulse Gen Model: 429525
Pulse Gen Serial Number: 84852229

## 2022-06-29 ENCOUNTER — Ambulatory Visit (INDEPENDENT_AMBULATORY_CARE_PROVIDER_SITE_OTHER): Payer: Medicare Other

## 2022-06-29 DIAGNOSIS — I255 Ischemic cardiomyopathy: Secondary | ICD-10-CM

## 2022-07-04 NOTE — Progress Notes (Signed)
Cardiology Office Note    Date:  07/07/2022   ID:  [name] [name], DOB 1951/04/13, MRN [id number]  PCP:  Owens Loffler, MD  Cardiologist:  [name], MD  Electrophysiologist:  Vickie Epley, MD   Chief Complaint: Follow up  History of Present Illness:   [name] [name] is a 71 y.o. 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   Prior LHC in 12/2012 showed nonobstructive disease as outlined below.  Echo at that time showed an EF of 50 to 55%, no regional wall motion abnormalities, grade 1 diastolic dysfunction, normal RV systolic function and PASP.  She was admitted to the hospital in 11/2019 with exertional dyspnea.  High-sensitivity troponin peaked at 415.  Echo demonstrated a new cardiomyopathy with an EF of 25 to 30%, global hypokinesis, mildly dilated LV internal cavity size, mild LVH, grade 1 diastolic dysfunction, normal RV systolic function and ventricular cavity size, mildly dilated left atrium, and an estimated right atrial pressure of 3 mmHg.  R/LHC at that time showed significant two-vessel CAD with diffuse LAD stenosis up to 70 to 80% in the mid/distal vessel and CTO of the mid RCA with left-to-right collaterals.  Moderately elevated left heart, right heart, and pulmonary artery pressures.  Low normal cardiac output/index.  Medical management was advised given small vessel size and diffuse disease involving the LAD and RCA making PCI and surgical revascularization suboptimal.  Repeat echo on GDMT in 02/2020 showed a persistent cardiomyopathy with an EF of 25 to 30%, global hypokinesis, mildly dilated LV internal cavity size, grade 2 diastolic dysfunction, normal RV systolic function and ventricular cavity size, mildly dilated left atrium, mild mitral  regurgitation, and mild to moderate aortic valve sclerosis without evidence of stenosis.  She subsequently underwent repeat LHC in 04/2020 which showed severe two-vessel CAD with diffuse LAD stenosis of up to 70 to 80% that was significant by IVUS, as well as CTO of the RCA.  She underwent successful IVUS guided PCI to the mid/distal LAD using overlapping DES.     She was admitted to the hospital in 07/2020 with acute on chronic combined systolic and diastolic CHF and a large left pleural effusion.  She underwent thoracentesis with 1.4 L of fluid removed with cytology being negative for malignancy.  Echo in 07/2020, again showed a persistent cardiomyopathy with an EF of 25 to 30%, global hypokinesis with severe hypokinesis of the anteroseptal wall and basal anterior wall, mildly dilated LV internal cavity size, normal RV systolic function and ventricular cavity size.   She underwent repeat echo on 02/01/2021, on maximally tolerated GDMT, which showed a slight improvement in her LVSF with an EF of 30 to 35%, global hypokinesis, moderately to severely dilated LV internal cavity size, grade 1 diastolic dysfunction, trivial pericardial effusion, and akinesis of the basal mid inferior wall.  She was evaluated by EP on 03/23/2021 with recommendation to proceed with ICD for primary prevention.  She subsequently underwent implantation of Biotronik ICD on 03/31/2021.  She was last seen by general cardiology in 12/2021 and noted some improvement in her fatigue following titration of GDMT.  She was exercising several times per day.  She was last seen by EP in 05/2022 and continued to do well.  No changes were indicated.  She comes in doing well  from a cardiac perspective, and is without symptoms of angina or decompensation.  No dyspnea, dizziness, presyncope, or syncope.  No significant lower extremity swelling or progressive orthopnea.  No falls, hematochezia, or melena.  Her weight is down 5 pounds when compared to her  visit in 12/2021.  She remains active at baseline.  She will be needing to have 2 of her teeth extracted.  Otherwise, she does not have any cardiac issues or concerns at this time.   Labs independently reviewed: 03/2022 - TC 163, TG 288, HDL 37, LDL 68, potassium 4.1, BUN 13, serum creatinine 0.69, albumin 4.2, AST/ALT normal 11/2021 - A1c 5.7 09/2021 - TSH normal, Hgb 16.1, PLT 350  Past Medical History:  Diagnosis Date   Allergic rhinitis 09/28/2008   Breast cancer ([place]) 2004   Left Breast Cancer   CHF (congestive heart failure) ([place]) 11/28/2019   Coronary artery disease    a. 10/2012 ETT: nl; b. 12/2012 Cath: LM nl, LAD min irregs, D1 min irregs, LCX 30p, RCA 30m RPDA 40p, EF 50%.   Diastolic dysfunction    a. 12/2012 Echo: EF 50-55%. No rwma. Gr1 DD. Nl RV fxn. Nl PASP.   Dry eyes 03/25/2021   HLD (hyperlipidemia) 09/28/2008   HTN (hypertension) 06/27/2011   Ischemic cardiomyopathy 12/02/2019   Osteoarthritis 09/28/2008   Osteopenia 11/16/2008    Past Surgical History:  Procedure Laterality Date   ABDOMINAL HYSTERECTOMY  1985   endometriosis; anatomy   BREAST SURGERY  10/2002   CARDIAC CATHETERIZATION  12/19/2012   [facility]; EF 50%   COLONOSCOPY  04/2008   Hyperplastic polyp; repeat in 10 years   CORONARY STENT INTERVENTION N/A 05/03/2020   Procedure: CORONARY STENT INTERVENTION;  Surgeon: ENelva Bush MD;  [facility];  Service: Cardiovascular;  Laterality: N/A;   HIP FRACTURE SURGERY  06/14/2011   ball replaced (Dr. REarvin Hansen   ICD IMPLANT N/A 03/31/2021   Procedure: ICD IMPLANT;  Surgeon: LVickie Epley MD;  [facility];  Service: Cardiovascular;  Laterality: N/A;   INTRAVASCULAR ULTRASOUND/IVUS N/A 05/03/2020   Procedure: Intravascular Ultrasound/IVUS;  Surgeon: ENelva Bush MD;  [facility];  Service: Cardiovascular;  Laterality: N/A;   LEFT HEART CATH N/A 05/03/2020   Procedure: Left Heart Cath;  Surgeon:  ENelva Bush MD;  [facility];  Service: Cardiovascular;  Laterality: N/A;   MASTECTOMY Left 2004   MOLE REMOVAL     RIGHT/LEFT HEART CATH AND CORONARY ANGIOGRAPHY N/A 12/02/2019   Procedure: RIGHT/LEFT HEART CATH AND CORONARY ANGIOGRAPHY;  Surgeon: ENelva Bush MD;  [facility];  Service: Cardiovascular;  Laterality: N/A;   TONSILLECTOMY  1958    Current Medications: Current Meds  Medication Sig   acetaminophen (TYLENOL) 650 MG CR tablet Take 1,300 mg by mouth every 8 (eight) hours as needed for pain.   alendronate (FOSAMAX) 70 MG tablet Take 1 tablet (70 mg total) by mouth every 7 (seven) days. Take with a full glass of water on an empty stomach.   bismuth subsalicylate (PEPTO BISMOL) 262 MG/15ML suspension Take 30 mLs by mouth every 6 (six) hours as needed for indigestion or diarrhea or loose stools.   Calcium Carbonate-Vitamin D 600-400 MG-UNIT tablet Take 1 tablet by mouth daily.    carvedilol (COREG) 12.5 MG tablet TAKE 1 TABLET (12.5 MG TOTAL) BY MOUTH 2 (TWO) TIMES DAILY WITH A MEAL. DOSAGE INCREASE   cetirizine (ZYRTEC) 10 MG tablet Take 10 mg  by mouth daily as needed for allergies.   clopidogrel (PLAVIX) 75 MG tablet TAKE 1 TABLET BY MOUTH EVERY DAY   dapagliflozin propanediol (FARXIGA) 10 MG TABS tablet Take 1 tablet (10 mg total) by mouth daily. PLEASE CALL [phone] TO SCHEDULE AN APPOINTMENT FOR FURTHER REFILLS   FLUoxetine (PROZAC) 20 MG capsule Take 1 capsule by mouth once daily   fluticasone (FLONASE) 50 MCG/ACT nasal spray Place 2 sprays into both nostrils daily.   furosemide (LASIX) 40 MG tablet Take 0.5 tablets (20 mg total) by mouth daily.   Glycerin-Hypromellose-PEG 400 (DRY EYE RELIEF DROPS) 0.2-0.2-1 % SOLN Place 1 drop into both eyes daily as needed (Dry eye).   Grape Seed Extract 60 MG CAPS Take 60 mg by mouth 2 (two) times daily.   Misc Natural Products (OSTEO BI-FLEX ADV JOINT SHIELD) TABS Take 1 tablet by mouth 2  (two) times daily.   Multiple Vitamin (MULTIVITAMIN WITH MINERALS) TABS tablet Take 1 tablet by mouth daily.   Multiple Vitamins-Minerals (EYE VITAMINS) CAPS Take 1 capsule by mouth daily. Vision Shield   pseudoephedrine-acetaminophen (TYLENOL SINUS) 30-500 MG TABS tablet Take 2 tablets by mouth every 4 (four) hours as needed (Sinus).   rosuvastatin (CRESTOR) 40 MG tablet Take 1 tablet (40 mg total) by mouth daily.   sacubitril-valsartan (ENTRESTO) 97-103 MG Take 1 tablet by mouth 2 (two) times daily.   spironolactone (ALDACTONE) 25 MG tablet Take 0.5 tablets (12.5 mg total) by mouth daily.   Study - [name] - olezarsen 50 mg, 80 mg or placebo SQ injection (PI-Hilty) Inject 80 mg into the skin once. For Investigational Use Only. Injection subcutaneously in protocol approved injection sites (abdomen, thigh or outer area of upper arm). Every 4 weeks. Please contact [facility] for any questions or concerns regarding this medication.    Allergies:   Codeine, Elemental sulfur, Morphine, Ace inhibitors, and Sulfonamide derivatives   Social History   Socioeconomic History   Marital status: Widowed    Spouse name: (Widow) to Richardson Landry   Number of children: Not on file   Years of education: Not on file   Highest education level: Not on file  Occupational History   Not on file  Tobacco Use   Smoking status: Former    Packs/day: 0.50    Years: 10.00    Total pack years: 5.00    Types: Cigarettes    Quit date: 28    Years since quitting: 36.8    Passive exposure: Past   Smokeless tobacco: Former    Quit date: 01/24/1986   Tobacco comments:    1989 quit   Vaping Use   Vaping Use: Never used  Substance and Sexual Activity   Alcohol use: No   Drug use: No   Sexual activity: Not on file  Other Topics Concern   Not on file  Social History Narrative   Widowed to husband Renato Gails      No regular exercise   Lives locally w/ tenants that help her w/ chores around the house.    Social Determinants of Health   Financial Resource Strain: Low Risk  (11/10/2021)   Overall Financial Resource Strain (CARDIA)    Difficulty of Paying Living Expenses: Not hard at all  Food Insecurity: No Food Insecurity (11/10/2021)   Hunger Vital Sign    Worried About Running Out of Food in the Last Year: Never true    Ran Out of Food in the Last Year: Never true  Transportation  Needs: No Transportation Needs (11/10/2021)   PRAPARE - Hydrologist (Medical): No    Lack of Transportation (Non-Medical): No  Physical Activity: Sufficiently Active (11/10/2021)   Exercise Vital Sign    Days of Exercise per Week: 7 days    Minutes of Exercise per Session: 30 min  Stress: No Stress Concern Present (11/10/2021)   [place]    Feeling of Stress : Not at all  Social Connections: Moderately Integrated (11/10/2021)   Social Connection and Isolation Panel [NHANES]    Frequency of Communication with Friends and Family: Three times a week    Frequency of Social Gatherings with Friends and Family: More than three times a week    Attends Religious Services: More than 4 times per year    Active Member of Clubs or Organizations: No    Attends Music therapist: More than 4 times per year    Marital Status: Widowed     Family History:  The patient's family history includes Breast cancer (age of onset: 42) in her sister; Cancer in her paternal aunt and sister; Diabetes in an other family member; Heart attack (age of onset: 53) in her father; Heart disease in her father; Ovarian cancer in an other family member; Sarcoidosis in her mother.  ROS:   12-point review of systems is negative unless otherwise noted in the HPI.   EKGs/Labs/Other Studies Reviewed:    Studies reviewed were summarized above. The additional studies were reviewed today:  2D echo 02/01/2021: 1. Left ventricular ejection fraction,  by estimation, is 30 to 35%. The  left ventricle has moderately decreased function. The left ventricle  demonstrates global hypokinesis. The left ventricular internal cavity size  was moderately to severely dilated.  Left ventricular diastolic parameters are consistent with Grade I  diastolic dysfunction (impaired relaxation). There is akinesis of the left  ventricular, basal-mid inferior wall.  __________   2D echo 07/16/2020: 1. Left ventricular ejection fraction, by estimation, is 25 to 30%. The  left ventricle has severely decreased function. The left ventricle  demonstrates global hypokinesis with severe hypokinesis of the  anterospetal wall and basal anterior wall. The left   ventricular internal cavity size was mildly dilated.   2. Right ventricular systolic function is normal. The right ventricular  size is normal. Tricuspid regurgitation signal is inadequate for assessing  PA pressure.  __________   LHC 05/03/2020: Conclusions: Severe two-vessel coronary artery disease with diffuse LAD disease of up to 70-80% that is significant by IVUS, as well as CTO of RCA. Upper normal LVEDP. Successful IVUS-guided PCI to mid/distal LAD using overlapping Resolute Onyx 2.5 x 38 mm and 2.25 x 34 mm drug-eluting stents with 0% residual stenosis and TIMI-3 flow.   Recommendations: Overnight extended recovery given multivessel CAD and LVEF < 35%. Obtain CXR and UA/urine culture for further evaluation of leukocytosis. Dual antiplatelet therapy with ASA and clopidogrel for at lest 6 months, ideally longer. Recheck lipid panel in AM and escalate statin therapy, as tolerated, if LDL > 70. __________   Cardiac MRI 04/17/2020: 1. Severe left ventricular enlargement with severely reduced LV function, LVEF 33%. Akinesis of inferior and inferolateral walls. 2. Post-contrast delayed myocardial enhancement in the inferior and inferolateral walls, with greater than 50% of the myocardial thickness  involved suggesting no viability in the RCA territory. 3.  Normal right ventricular size and systolic function. __________   2D echo 03/01/2020: 1. Left  ventricular ejection fraction, by estimation, is 25 to 30%. The  left ventricle has severely decreased function. The left ventricle  demonstrates global hypokinesis. The left ventricular internal cavity size  was mildly dilated. Left ventricular  diastolic parameters are consistent with Grade II diastolic dysfunction  (pseudonormalization).   2. Right ventricular systolic function is normal. The right ventricular  size is normal. Tricuspid regurgitation signal is inadequate for assessing  PA pressure.   3. Left atrial size was mildly dilated.   4. The mitral valve is normal in structure. Mild mitral valve  regurgitation. No evidence of mitral stenosis.   5. The aortic valve is normal in structure. Aortic valve regurgitation is  not visualized. Mild to moderate aortic valve sclerosis/calcification is  present, without any evidence of aortic stenosis.   Comparison(s): Previous Echo showed new finding of LV EF 25-30% with  severely decreased function, global HK, and grade I diastolic dysfunction.  __________   [facility] 12/02/2019: Conclusions: Significant two-vessel coronary artery disease with diffuse LAD disease of up to 70-80% in the mid/distal vessel and chronic subtotal occlusion of the mid RCA with left-to-right collaterals. Moderately elevated left heart, right heart, and pulmonary artery pressures. Low normal Fick cardiac output/index.   Recommendations: Escalate diuresis and optimize evidence-based heart failure therapy. Medical management of coronary artery disease.  Small vessel size and diffuse disease involving LAD and RCA make percutaneous and surgical revascularization suboptimal. __________   2D echo 11/29/2019: 1. Left ventricular ejection fraction, by estimation, is 25 to 30%. The  left ventricle has severely decreased  function. The left ventricle  demonstrates global hypokinesis. The left ventricular internal cavity size  was mildly dilated. There is mild left  ventricular hypertrophy. Left ventricular diastolic parameters are  consistent with Grade I diastolic dysfunction (impaired relaxation).   2. Right ventricular systolic function is normal. The right ventricular  size is normal. Tricuspid regurgitation signal is inadequate for assessing  PA pressure.   3. Left atrial size was mildly dilated.   4. The inferior vena cava is normal in size with greater than 50%  respiratory variability, suggesting right atrial pressure of 3 mmHg. __________   [facility] 12/19/2012: Left main normal, LAD minor luminal irregularities, proximal LCx 30% stenosis, mid RCA 50% stenosis, proximal RPDA 40% stenosis, LVEF estimated 50%.  Medical therapy recommended. __________   2D echo 12/10/2012: - Left ventricle: The cavity size was normal. Systolic    function was normal. The estimated ejection fraction was    in the range of 50% to 55%. Wall motion was normal; there    were no regional wall motion abnormalities. Doppler    parameters are consistent with abnormal left ventricular    relaxation (grade 1 diastolic dysfunction).  - Left atrium: The atrium was normal in size.  - Right ventricle: Systolic function was normal.  - Pulmonary arteries: Systolic pressure was within the    normal range.    EKG:  EKG is ordered today.  The EKG ordered today demonstrates NSR, 74 bpm, LVH, poor R wave progression along the precordial leads, nonspecific ST-T changes  Recent Labs: 09/29/2021: Hemoglobin 16.1; Platelets 350; TSH 2.730 03/31/2022: ALT 22; BUN 13; Creatinine, Ser 0.69; Potassium 4.1; Sodium 142  Recent Lipid Panel    Component Value Date/Time   CHOL 163 03/31/2022 0937   CHOL 154 12/22/2021 0900   TRIG 288 (H) 03/31/2022 0937   HDL 37 (L) 03/31/2022 0937   HDL 36 (L) 12/22/2021 0900   CHOLHDL 4.4 03/31/2022 0932  VLDL  58 (H) 03/31/2022 0937   LDLCALC 68 03/31/2022 0937   LDLCALC 74 12/22/2021 0900   LDLDIRECT 63.9 10/06/2021 1147    PHYSICAL EXAM:    VS:  BP 130/80 (BP Location: Left Arm, Patient Position: Sitting, Cuff Size: Large)   Pulse 74   Ht '5\' 4"'$  (1.626 m)   Wt 208 lb 4 oz (94.5 kg)   SpO2 93%   BMI 35.75 kg/m   BMI: Body mass index is 35.75 kg/m.  Physical Exam Vitals reviewed.  Constitutional:      Appearance: She is well-developed.  HENT:     Head: Normocephalic and atraumatic.  Eyes:     General:        Right eye: No discharge.        Left eye: No discharge.  Neck:     Vascular: No JVD.  Cardiovascular:     Rate and Rhythm: Normal rate and regular rhythm.     Heart sounds: Normal heart sounds, S1 normal and S2 normal. Heart sounds not distant. No midsystolic click and no opening snap. No murmur heard.    No friction rub.  Pulmonary:     Effort: Pulmonary effort is normal. No respiratory distress.     Breath sounds: Normal breath sounds. No decreased breath sounds, wheezing or rales.  Chest:     Chest wall: No tenderness.  Abdominal:     General: There is no distension.  Musculoskeletal:     Cervical back: Normal range of motion.     Right lower leg: No edema.     Left lower leg: No edema.  Skin:    General: Skin is warm and dry.     Nails: There is no clubbing.  Neurological:     Mental Status: She is alert and oriented to person, place, and time.  Psychiatric:        Speech: Speech normal.        Behavior: Behavior normal.        Thought Content: Thought content normal.        Judgment: Judgment normal.     Wt Readings from Last 3 Encounters:  07/07/22 208 lb 4 oz (94.5 kg)  06/22/22 207 lb 12.8 oz (94.3 kg)  06/07/22 208 lb 6.4 oz (94.5 kg)     ASSESSMENT & PLAN:   CAD involving the native coronary arteries without angina: She is doing well and is without symptoms of angina.  Continue aggressive risk factor modification and secondary prevention  including clopidogrel, carvedilol, and rosuvastatin.  No indication for further ischemic testing at this time.  HFrEF secondary to ICM status post ICD: She appears euvolemic and well compensated.  Continue current GDMT including carvedilol, Farxiga, Entresto, spironolactone, and furosemide.  Recent labs showed stable renal function and electrolytes.  No device alarms or discharges.  Follow-up with EP for ongoing management of ICD.  HTN: Blood pressure is well controlled in the office today.  Continue medical therapy as outlined above.  HLD: LDL 68 in 03/2022 with normal AST/ALT at that time.  Preprocedure cardiac risk stratification: She will be needing to have 2 teeth extracted.  She is able to achieve greater than 4 METS at baseline without cardiac limitation.  No further cardiac testing is indicated prior to dental extraction.  If clopidogrel needs to be held prior to dental extraction, this can be done, though I would recommend she take aspirin 81 mg daily while off clopidogrel.  Clopidogrel should be resumed as soon as  safely possible post dental extraction, at which time she would be okay to discontinue aspirin and continue clopidogrel monotherapy.    Disposition: F/u with Dr. Saunders Revel or an APP in 6 months, and EP as directed.   Medication Adjustments/Labs and Tests Ordered: Current medicines are reviewed at length with the patient today.  Concerns regarding medicines are outlined above. Medication changes, Labs and Tests ordered today are summarized above and listed in the Patient Instructions accessible in Encounters.   Signed, Christell Faith, PA-C 07/07/2022 10:27 AM     [facility] [address] [place] [address] [place], [place] [address] [phone]

## 2022-07-07 ENCOUNTER — Ambulatory Visit: Payer: Medicare Other | Attending: Physician Assistant | Admitting: Physician Assistant

## 2022-07-07 ENCOUNTER — Encounter: Payer: Self-pay | Admitting: Physician Assistant

## 2022-07-07 VITALS — BP 130/80 | HR 74 | Ht 64.0 in | Wt 208.2 lb

## 2022-07-07 DIAGNOSIS — I5022 Chronic systolic (congestive) heart failure: Secondary | ICD-10-CM | POA: Diagnosis not present

## 2022-07-07 DIAGNOSIS — I255 Ischemic cardiomyopathy: Secondary | ICD-10-CM

## 2022-07-07 DIAGNOSIS — E785 Hyperlipidemia, unspecified: Secondary | ICD-10-CM

## 2022-07-07 DIAGNOSIS — I2510 Atherosclerotic heart disease of native coronary artery without angina pectoris: Secondary | ICD-10-CM | POA: Diagnosis not present

## 2022-07-07 DIAGNOSIS — Z01810 Encounter for preprocedural cardiovascular examination: Secondary | ICD-10-CM

## 2022-07-07 DIAGNOSIS — I10 Essential (primary) hypertension: Secondary | ICD-10-CM

## 2022-07-07 NOTE — Patient Instructions (Signed)
Medication Instructions:  No changes at this time.   *If you need a refill on your cardiac medications before your next appointment, please call your pharmacy*   Lab Work: None  If you have labs (blood work) drawn today and your tests are completely normal, you will receive your results only by: [place] (if you have MyChart) OR A paper copy in the mail If you have any lab test that is abnormal or we need to change your treatment, we will call you to review the results.   Testing/Procedures: None   Follow-Up: At [facility], you and your health needs are our priority.  As part of our continuing mission to provide you with exceptional heart care, we have created designated Provider Care Teams.  These Care Teams include your primary Cardiologist (physician) and Advanced Practice Providers (APPs -  Physician Assistants and Nurse Practitioners) who all work together to provide you with the care you need, when you need it.   Your next appointment:   6 month(s)  The format for your next appointment:   In Person  Provider:   [name], MD or Christell Faith, PA-C       Important Information About Sugar

## 2022-07-10 NOTE — Progress Notes (Signed)
Remote ICD transmission.   

## 2022-07-12 NOTE — Research (Cosign Needed Addendum)
Ilda Foil [name] Week 1 Day 1 22-Jun-2022    Apolipoprotein B48   3.28 mg/dL                [] Clinically Significant  [x] Not Clinically Significant      LDL-c (Ultracentrifugation)  46 mg/dL  [] Clinically Significant  [x] Not Clinically Significant   Any further action needed to be taken per the PI?  No  Chrystie Nose, MD, [facility], FACP  [place]  [facility]  Medical Director of the Advanced Lipid Disorders &  Cardiovascular Risk Reduction Clinic Diplomate of the American Board of Clinical Lipidology Attending Cardiologist  Direct Dial: [phone]  Fax: [phone]  Website:  [email or web address]

## 2022-07-14 ENCOUNTER — Other Ambulatory Visit: Payer: Self-pay | Admitting: Family Medicine

## 2022-07-14 ENCOUNTER — Telehealth: Payer: Self-pay | Admitting: Internal Medicine

## 2022-07-14 NOTE — Telephone Encounter (Signed)
   Pre-operative Risk Assessment    Patient Name: [name] [name]  DOB: 1951-03-26 MRN: [id number]{     Request for Surgical Clearance{     Procedure:   3  Date of Surgery:  Clearance TBD                               Surgeon:  NOT INDICATED Surgeon's Group or Practice Name:  [facility] Phone number:  [phone] Fax number:  [phone]  Type of Clearance Requested:   - Medical    Type of Anesthesia:  Local WITH EPINEPHRINE   Additional requests/questions:    Signed, Eli Phillips   07/14/2022, 2:14 PM

## 2022-07-14 NOTE — Telephone Encounter (Signed)
   Patient Name: [name] [name]  DOB: 08/09/1951 MRN: [id number]  Primary Cardiologist: [name], MD  Chart reviewed as part of pre-operative protocol coverage. Given past medical history and time since last visit, based on ACC/AHA guidelines, AKITA MAXIM is at acceptable risk for the planned procedure without further cardiovascular testing.   The patient was cleared for this procedure at recent office visit with Christell Faith, PA, on 07/09/2022.  I will route this recommendation as the note from recent office visit which includes clearance to the requesting party via Epic fax function and remove from pre-op pool.  Please call with questions.  Lenna Sciara, NP 07/14/2022, 3:47 PM

## 2022-07-14 NOTE — Telephone Encounter (Signed)
Spoke with Benjamine Mola from requesting office and she states that patient is having 3 extractions and although not listed on the clearance they would like to know if pt needs to hold any mediations. Pt is on Plavix.

## 2022-07-17 DIAGNOSIS — D0359 Melanoma in situ of other part of trunk: Secondary | ICD-10-CM | POA: Diagnosis not present

## 2022-07-19 ENCOUNTER — Encounter: Payer: Self-pay | Admitting: *Deleted

## 2022-07-19 DIAGNOSIS — Z006 Encounter for examination for normal comparison and control in clinical research program: Secondary | ICD-10-CM

## 2022-07-19 NOTE — Research (Signed)
Message left to remind Ms Spark of her appointment tomorrow at 0900, gave parking coed and reminded to be NPO.

## 2022-07-20 ENCOUNTER — Encounter: Payer: Medicare Other | Admitting: *Deleted

## 2022-07-20 ENCOUNTER — Other Ambulatory Visit: Payer: Self-pay

## 2022-07-20 VITALS — BP 142/83 | HR 75 | Temp 97.3°F | Resp 18

## 2022-07-20 DIAGNOSIS — Z006 Encounter for examination for normal comparison and control in clinical research program: Secondary | ICD-10-CM

## 2022-07-20 MED ORDER — STUDY - ESSENCE - OLEZARSEN 50 MG, 80 MG OR PLACEBO SQ INJECTION (PI-HILTY)
80.0000 mg | INJECTION | SUBCUTANEOUS | Status: DC
  Administered 2022-07-20: 80 mg via SUBCUTANEOUS
  Filled 2022-07-20: qty 0.8

## 2022-07-20 NOTE — Research (Addendum)
[name] [name] Week 5 day 29 [name] 20-Jul-2022     [name] [name] Week 5 Day 29 [name]  20-Jul-2022          Chemistry: AST/SGOT 13 U/L                '[]'$ Clinically Significant  '[x]'$ Not Clinically Significant  Lipase 99 U/L                        '[x]'$ Clinically Significant  '[]'$ Not Clinically Significant   Hematology: Hematocrit 47 %                     '[]'$ Clinically Significant  '[x]'$ Not Clinically Significant RDW 15.1 %                           '[]'$ Clinically Significant  '[x]'$ Not Clinically Significant   Urinalysis: Glucose   1000  mg/dL                  '[]'$ Clinically Significant  '[x]'$ Not Clinically Significant Specific Gravity 1.043                    '[]'$ Clinically Significant  '[x]'$ Not Clinically Significant   Protein   30 mg/dL                          '[]'$ Clinically Significant  '[x]'$ Not Clinically Significant  Bacteria 1+                                      '[]'$ Clinically Significant  '[x]'$ Not Clinically Significant Amorphous Present                         '[]'$ Clinically Significant '[x]'$  Not Clinically Significant Squamous Epithelial Cells 1-2         '[]'$ Clinically Significant  '[x]'$ Not Clinically Significant Mucus 1+                                         '[]'$ Clinically Significant  '[x]'$ Not Clinically Significant     Any further action needed to be taken per the PI? No  Lipase is elevated - ?any abdominal pain. No c/o abd pain 07-20-22. I called her today to check on her. Message left for her to call me back. 07-24-22  [name], MD, [facility], [place] Director of the Advanced Lipid Disorders &  Cardiovascular Risk Reduction Clinic Diplomate of the American Board of Clinical Lipidology Attending Cardiologist  Direct Dial: [phone]  Fax: [phone]  Website:  [email or web address]    TREATMENT DAY 29 - STUDY WEEK-5    Subject Number: [id number]            Randomization Number: [id number]            Date:20-Jul-2022      '[x]'$ Vital Signs  Collected - Blood Pressure: 142/83 - Heart Rate:75 - Respiratory Rate:18 - Temperature:97.3 - Oxygen Saturation: 96%   '[x]'$  Extended Urinalysis   '[x]'$  Lab collection per protocol  '[x]'$   (abdominal pain only) since last visit  '[x]'$  Assessment of ER Visits, Hospitalizations, and Inpatient Days  '[x]'$  Adverse Events and Concomitant Medications  '[x]'$  Diet, Lifestyle, and Alcohol  Counseling   '[x]'$  Study Drug: [place] Injection   [name] [name] is here for [name] Week 5 day 29 visit. VS taken at 0856, Blood work drawn at 0906, and urine obtained at 0914. Injection given at 0953, in right lower abd.  No visits to the ED or Urgent care, no abd pain, and no changed in her medications. Next appointment scheduled for Dec 7 at 0900.    Current Outpatient Medications:    acetaminophen (TYLENOL) 650 MG CR tablet, Take 1,300 mg by mouth every 8 (eight) hours as needed for pain., Disp: , Rfl:    alendronate (FOSAMAX) 70 MG tablet, Take 1 tablet (70 mg total) by mouth every 7 (seven) days. Take with a full glass of water on an empty stomach., Disp: 12 tablet, Rfl: 3   bismuth subsalicylate (PEPTO BISMOL) 262 MG/15ML suspension, Take 30 mLs by mouth every 6 (six) hours as needed for indigestion or diarrhea or loose stools., Disp: , Rfl:    Calcium Carbonate-Vitamin D 600-400 MG-UNIT tablet, Take 1 tablet by mouth daily. , Disp: , Rfl:    carvedilol (COREG) 12.5 MG tablet, TAKE 1 TABLET (12.5 MG TOTAL) BY MOUTH 2 (TWO) TIMES DAILY WITH A MEAL. DOSAGE INCREASE, Disp: 180 tablet, Rfl: 0   cetirizine (ZYRTEC) 10 MG tablet, Take 10 mg by mouth daily as needed for allergies., Disp: , Rfl:    clopidogrel (PLAVIX) 75 MG tablet, TAKE 1 TABLET BY MOUTH EVERY DAY, Disp: 90 tablet, Rfl: 3   dapagliflozin propanediol (FARXIGA) 10 MG TABS tablet, Take 1 tablet (10 mg total) by mouth daily. PLEASE CALL [phone] TO SCHEDULE AN APPOINTMENT FOR FURTHER REFILLS, Disp: 90 tablet, Rfl: 2   FLUoxetine (PROZAC) 20 MG capsule, Take 1  capsule by mouth once daily, Disp: 90 capsule, Rfl: 1   fluticasone (FLONASE) 50 MCG/ACT nasal spray, Place 2 sprays into both nostrils daily., Disp: 16 g, Rfl: 6   furosemide (LASIX) 40 MG tablet, Take 0.5 tablets (20 mg total) by mouth daily., Disp: 45 tablet, Rfl: 0   Glycerin-Hypromellose-PEG 400 (DRY EYE RELIEF DROPS) 0.2-0.2-1 % SOLN, Place 1 drop into both eyes daily as needed (Dry eye)., Disp: , Rfl:    Grape Seed Extract 60 MG CAPS, Take 60 mg by mouth 2 (two) times daily., Disp: , Rfl:    Misc Natural Products (OSTEO BI-FLEX ADV JOINT SHIELD) TABS, Take 1 tablet by mouth 2 (two) times daily., Disp: , Rfl:    Multiple Vitamin (MULTIVITAMIN WITH MINERALS) TABS tablet, Take 1 tablet by mouth daily., Disp: , Rfl:    Multiple Vitamins-Minerals (EYE VITAMINS) CAPS, Take 1 capsule by mouth daily. Vision Shield, Disp: , Rfl:    pseudoephedrine-acetaminophen (TYLENOL SINUS) 30-500 MG TABS tablet, Take 2 tablets by mouth every 4 (four) hours as needed (Sinus)., Disp: , Rfl:    rosuvastatin (CRESTOR) 40 MG tablet, Take 1 tablet (40 mg total) by mouth daily., Disp: 90 tablet, Rfl: 3   sacubitril-valsartan (ENTRESTO) 97-103 MG, Take 1 tablet by mouth 2 (two) times daily., Disp: 180 tablet, Rfl: 0   spironolactone (ALDACTONE) 25 MG tablet, Take 0.5 tablets (12.5 mg total) by mouth daily., Disp: 90 tablet, Rfl: 3   Study - [name] - olezarsen 50 mg, 80 mg or placebo SQ injection (PI-Hilty), Inject 80 mg into the skin once. For Investigational Use Only. Injection subcutaneously in protocol approved injection sites (abdomen, thigh or outer area of upper arm). Every 4 weeks. Please contact [facility] for any questions or concerns regarding  this medication., Disp: , Rfl:   Current Facility-Administered Medications:    Study - [name] - olezarsen 50 mg, 80 mg or placebo SQ injection (PI-Hilty), 80 mg, Subcutaneous, Q28 days, [name], MD, 80 mg at 07/20/22 [phone]

## 2022-07-24 ENCOUNTER — Telehealth: Payer: Self-pay

## 2022-07-24 NOTE — Telephone Encounter (Signed)
Received the following alerts from [employer]:  Atrial monitoring episode detected 1 atrial monitoring episode(s) detected between Jul 22, 2022 1:49:02 AM and Jul 24, 2022 1:49:03 AM     New.  Details for arrhythmia episodes received (all types) Details were received for 3 spontaneous episodes, which were detected between Jul 22, 2022 11:49:39 AM and Jul 22, 2022 11:50:53 AM  Patient's RV lead appears to have detected new onset (84mns) of AF with RVR.  [place] an [place] no hx recorded of prior episodes.  I have LM on patients VM to discuss referral to Afib clinic for ongoing monitoring/management and copy to Dr. LQuentin Ore     111mute episode    2nd episode: (flagged as NSVT but appears AF with RVR)   3rd recorded episode (ongoing at transmission, unsure how long)

## 2022-07-26 NOTE — Telephone Encounter (Signed)
Attempted to contact patient. No answer,LMTCB 

## 2022-07-27 NOTE — Telephone Encounter (Signed)
Spoke to patient advising AF was seen on her device and Dr. Quentin Ore recommends AF clinic referral to discuss [name] [name]. Patient agreeable. Advised someone from AF clinic will contact for apt. Voiced understanding.

## 2022-08-13 NOTE — Progress Notes (Unsigned)
    Sohrab Keelan T. Rubie Ficco, MD, [place] at [facility] [place], [address]  Phone: [phone]  FAX: [place] - 71 y.o. female  MRN [id number]  Date of Birth: 1951-02-12  Date: 08/14/2022  PCP: Owens Loffler, MD  Referral: Owens Loffler, MD  No chief complaint on file.  Subjective:   [name] [name] is a 71 y.o. very pleasant female patient with There is no height or weight on file to calculate BMI. who presents with the following:  Patient presents with an acute illness.  She is having some congestion and coughing.    Review of Systems is noted in the HPI, as appropriate  Objective:   There were no vitals taken for this visit.  GEN: No acute distress; alert,appropriate. PULM: Breathing comfortably in no respiratory distress PSYCH: Normally interactive.   Laboratory and Imaging Data:  Assessment and Plan:   ***

## 2022-08-14 ENCOUNTER — Encounter: Payer: Self-pay | Admitting: Family Medicine

## 2022-08-14 ENCOUNTER — Ambulatory Visit (INDEPENDENT_AMBULATORY_CARE_PROVIDER_SITE_OTHER): Payer: Medicare Other | Admitting: Family Medicine

## 2022-08-14 VITALS — BP 158/90 | HR 76 | Temp 98.4°F | Ht 64.0 in | Wt 207.5 lb

## 2022-08-14 DIAGNOSIS — J208 Acute bronchitis due to other specified organisms: Secondary | ICD-10-CM

## 2022-08-14 DIAGNOSIS — I5022 Chronic systolic (congestive) heart failure: Secondary | ICD-10-CM

## 2022-08-14 DIAGNOSIS — I255 Ischemic cardiomyopathy: Secondary | ICD-10-CM

## 2022-08-14 DIAGNOSIS — I2510 Atherosclerotic heart disease of native coronary artery without angina pectoris: Secondary | ICD-10-CM | POA: Diagnosis not present

## 2022-08-14 MED ORDER — DOXYCYCLINE HYCLATE 100 MG PO TABS: 100.0000 mg | ORAL_TABLET | Freq: Two times a day (BID) | ORAL | 0 refills | Status: DC

## 2022-08-16 ENCOUNTER — Ambulatory Visit (HOSPITAL_COMMUNITY): Payer: Medicare Other | Admitting: Nurse Practitioner

## 2022-08-16 ENCOUNTER — Encounter: Payer: Self-pay | Admitting: *Deleted

## 2022-08-16 DIAGNOSIS — Z006 Encounter for examination for normal comparison and control in clinical research program: Secondary | ICD-10-CM

## 2022-08-16 NOTE — Research (Signed)
Spoke with [name] [name] to remind her of her appointment at 0900 tomorrow with research. Gave parking code and remind her to be NPO until after labs drawn. Voices understanding.

## 2022-08-17 ENCOUNTER — Encounter: Payer: Medicare Other | Admitting: *Deleted

## 2022-08-17 DIAGNOSIS — Z006 Encounter for examination for normal comparison and control in clinical research program: Secondary | ICD-10-CM

## 2022-08-17 MED ORDER — STUDY - ESSENCE - OLEZARSEN 50 MG, 80 MG OR PLACEBO SQ INJECTION (PI-HILTY)
80.0000 mg | INJECTION | SUBCUTANEOUS | Status: DC
  Administered 2022-08-17: 80 mg via SUBCUTANEOUS
  Filled 2022-08-17: qty 0.8

## 2022-08-17 NOTE — Research (Addendum)
[name] [name] [name] Week 9 Day 74- [name] 17-Aug-2022      [name] [name] Week 9 day 37 [name] 17-Aug-2022           Hematology: Hematocrit 46 %                                 '[]'$ Clinically Significant  '[x]'$ Not Clinically Significant RDW 15.3 %                                       '[]'$ Clinically Significant  '[x]'$ Not Clinically Significant   Urinalysis: Protein 10 mg/dL                                   '[]'$ Clinically Significant  '[x]'$ Not Clinically Significant Leukocyte Esterase 75                          '[]'$ Clinically Significant  '[x]'$ Not Clinically Significant Urinary White Cells 6-9 per HPF           '[]'$ Clinically Significant  '[x]'$ Not Clinically Significant Bacteria 1+                                            '[]'$ Clinically Significant  '[x]'$ Not Clinically Significant Amorphous present                               '[]'$ Clinically Significant  '[x]'$ Not Clinically Significant Squamous Epithelial cells 6-9 per HPF '[]'$ Clinically Significant  '[x]'$ Not Clinically Significant Mucus 1+                                               '[]'$ Clinically Significant  '[x]'$ Not Clinically Significant   Any further action needed to be taken per the PI? No  [name], MD, [facility], [place] Director of the Advanced Lipid Disorders &  Cardiovascular Risk Reduction Clinic Diplomate of the American Board of Clinical Lipidology Attending Cardiologist  Direct Dial: [phone]  Fax: [phone]  Website:  [email or web address]     TREATMENT DAY 57 - STUDY WEEK 9    Subject Number: [id number]              Randomization Number:[id number]           Date:17-Aug-2022      '[x]'$ Vital Signs Collected - Blood Pressure:150/79 - Heart Rate:76 - Respiratory Rate:16 - Temperature:97.2 - Oxygen Saturation:99%   '[x]'$  Extended Urinalysis   '[x]'$  Lab collection per protocol  '[x]'$   (abdominal pain only) since last visit  '[x]'$  Assessment of ER Visits, Hospitalizations, and Inpatient  Days  '[x]'$  Adverse Events and Concomitant Medications  '[x]'$  Diet, Lifestyle, and Alcohol Counseling   [name] [name] is here for Week 9 Day 57.she reports no abd pain, no visits to the ED or Urgent care. She does report having Bronchitis and has 6 more days of doxycycline. Vs taken 0845, urine obtained 0955, blood work drawn at [facility].injection given in right lower  abd. Tol well. No medication changes.    Current Outpatient Medications:    acetaminophen (TYLENOL) 650 MG CR tablet, Take 1,300 mg by mouth every 8 (eight) hours as needed for pain., Disp: , Rfl:    alendronate (FOSAMAX) 70 MG tablet, Take 1 tablet (70 mg total) by mouth every 7 (seven) days. Take with a full glass of water on an empty stomach., Disp: 12 tablet, Rfl: 3   bismuth subsalicylate (PEPTO BISMOL) 262 MG/15ML suspension, Take 30 mLs by mouth every 6 (six) hours as needed for indigestion or diarrhea or loose stools., Disp: , Rfl:    Calcium Carbonate-Vitamin D 600-400 MG-UNIT tablet, Take 1 tablet by mouth daily. , Disp: , Rfl:    carvedilol (COREG) 12.5 MG tablet, TAKE 1 TABLET (12.5 MG TOTAL) BY MOUTH 2 (TWO) TIMES DAILY WITH A MEAL. DOSAGE INCREASE, Disp: 180 tablet, Rfl: 0   cetirizine (ZYRTEC) 10 MG tablet, Take 10 mg by mouth daily as needed for allergies., Disp: , Rfl:    clopidogrel (PLAVIX) 75 MG tablet, TAKE 1 TABLET BY MOUTH EVERY DAY, Disp: 90 tablet, Rfl: 3   dapagliflozin propanediol (FARXIGA) 10 MG TABS tablet, Take 1 tablet (10 mg total) by mouth daily. PLEASE CALL [phone] TO SCHEDULE AN APPOINTMENT FOR FURTHER REFILLS, Disp: 90 tablet, Rfl: 2   doxycycline (VIBRA-TABS) 100 MG tablet, Take 1 tablet (100 mg total) by mouth 2 (two) times daily., Disp: 20 tablet, Rfl: 0   FLUoxetine (PROZAC) 20 MG capsule, Take 1 capsule by mouth once daily, Disp: 90 capsule, Rfl: 1   fluticasone (FLONASE) 50 MCG/ACT nasal spray, Place 2 sprays into both nostrils daily., Disp: 16 g, Rfl: 6   furosemide (LASIX) 40 MG tablet, Take  0.5 tablets (20 mg total) by mouth daily., Disp: 45 tablet, Rfl: 0   Glycerin-Hypromellose-PEG 400 (DRY EYE RELIEF DROPS) 0.2-0.2-1 % SOLN, Place 1 drop into both eyes daily as needed (Dry eye)., Disp: , Rfl:    Grape Seed Extract 60 MG CAPS, Take 60 mg by mouth 2 (two) times daily., Disp: , Rfl:    Misc Natural Products (OSTEO BI-FLEX ADV JOINT SHIELD) TABS, Take 1 tablet by mouth 2 (two) times daily., Disp: , Rfl:    Multiple Vitamin (MULTIVITAMIN WITH MINERALS) TABS tablet, Take 1 tablet by mouth daily., Disp: , Rfl:    Multiple Vitamins-Minerals (EYE VITAMINS) CAPS, Take 1 capsule by mouth daily. Vision Shield, Disp: , Rfl:    pseudoephedrine-acetaminophen (TYLENOL SINUS) 30-500 MG TABS tablet, Take 2 tablets by mouth every 4 (four) hours as needed (Sinus)., Disp: , Rfl:    rosuvastatin (CRESTOR) 40 MG tablet, Take 1 tablet (40 mg total) by mouth daily., Disp: 90 tablet, Rfl: 3   sacubitril-valsartan (ENTRESTO) 97-103 MG, Take 1 tablet by mouth 2 (two) times daily., Disp: 180 tablet, Rfl: 0   spironolactone (ALDACTONE) 25 MG tablet, Take 0.5 tablets (12.5 mg total) by mouth daily., Disp: 90 tablet, Rfl: 3   Study - [name] - olezarsen 50 mg, 80 mg or placebo SQ injection (PI-Hilty), Inject 80 mg into the skin once. For Investigational Use Only. Injection subcutaneously in protocol approved injection sites (abdomen, thigh or outer area of upper arm). Every 4 weeks. Please contact [facility] for any questions or concerns regarding this medication., Disp: , Rfl:   Current Facility-Administered Medications:    Study - [name] - olezarsen 50 mg, 80 mg or placebo SQ injection (PI-Hilty), 80 mg, Subcutaneous, Q28 days, Hilty, Nadean Corwin, MD, 80 mg at  08/17/22 0956   '[]'$  Study Drug: [place] Injection

## 2022-08-24 ENCOUNTER — Other Ambulatory Visit: Payer: Self-pay | Admitting: Internal Medicine

## 2022-08-24 ENCOUNTER — Other Ambulatory Visit: Payer: Self-pay | Admitting: Student

## 2022-09-12 ENCOUNTER — Other Ambulatory Visit: Payer: Self-pay | Admitting: Family Medicine

## 2022-09-12 ENCOUNTER — Other Ambulatory Visit: Payer: Self-pay | Admitting: Internal Medicine

## 2022-09-13 ENCOUNTER — Encounter: Payer: Self-pay | Admitting: *Deleted

## 2022-09-13 ENCOUNTER — Ambulatory Visit (HOSPITAL_COMMUNITY)
Admission: RE | Admit: 2022-09-13 | Discharge: 2022-09-13 | Disposition: A | Discharge status: Home / Self Care | Payer: Medicare Other | Source: Ambulatory Visit | Attending: Nurse Practitioner | Admitting: Nurse Practitioner

## 2022-09-13 ENCOUNTER — Encounter (HOSPITAL_COMMUNITY): Payer: Self-pay | Admitting: Nurse Practitioner

## 2022-09-13 VITALS — BP 156/76 | HR 83 | Ht 64.0 in | Wt 206.2 lb

## 2022-09-13 DIAGNOSIS — I447 Left bundle-branch block, unspecified: Secondary | ICD-10-CM | POA: Insufficient documentation

## 2022-09-13 DIAGNOSIS — Z006 Encounter for examination for normal comparison and control in clinical research program: Secondary | ICD-10-CM

## 2022-09-13 DIAGNOSIS — I2510 Atherosclerotic heart disease of native coronary artery without angina pectoris: Secondary | ICD-10-CM | POA: Insufficient documentation

## 2022-09-13 DIAGNOSIS — Z8249 Family history of ischemic heart disease and other diseases of the circulatory system: Secondary | ICD-10-CM | POA: Insufficient documentation

## 2022-09-13 DIAGNOSIS — Z87891 Personal history of nicotine dependence: Secondary | ICD-10-CM | POA: Insufficient documentation

## 2022-09-13 DIAGNOSIS — I10 Essential (primary) hypertension: Secondary | ICD-10-CM | POA: Diagnosis not present

## 2022-09-13 DIAGNOSIS — Z79899 Other long term (current) drug therapy: Secondary | ICD-10-CM | POA: Insufficient documentation

## 2022-09-13 DIAGNOSIS — Z9012 Acquired absence of left breast and nipple: Secondary | ICD-10-CM | POA: Insufficient documentation

## 2022-09-13 DIAGNOSIS — I255 Ischemic cardiomyopathy: Secondary | ICD-10-CM | POA: Diagnosis not present

## 2022-09-13 DIAGNOSIS — I4891 Unspecified atrial fibrillation: Secondary | ICD-10-CM | POA: Diagnosis not present

## 2022-09-13 DIAGNOSIS — Z7902 Long term (current) use of antithrombotics/antiplatelets: Secondary | ICD-10-CM | POA: Diagnosis not present

## 2022-09-13 DIAGNOSIS — Z853 Personal history of malignant neoplasm of breast: Secondary | ICD-10-CM | POA: Diagnosis not present

## 2022-09-13 DIAGNOSIS — Z95810 Presence of automatic (implantable) cardiac defibrillator: Secondary | ICD-10-CM | POA: Diagnosis not present

## 2022-09-13 DIAGNOSIS — Z803 Family history of malignant neoplasm of breast: Secondary | ICD-10-CM | POA: Insufficient documentation

## 2022-09-13 NOTE — Progress Notes (Signed)
Primary Care Physician: Owens Loffler, MD Referring Physician: Device clinic /Dr. Jeryl Columbia is a 72 y.o. female with a h/o CAD, HTN, ischemic cardiomyoplasty with ICD, s/p L sided breast cancer s/p mastectomy, that  is in the afib for 10 mins of afib noted 11/11 from  which she was asymptomatic. She is in SR today. She is on Plavix for receiving stents in 2021. Dr. Quentin Ore wanted me to discuss with her start of anticoagulation for a CHA2DS2VASc  score of  at least 5. She is interested in [place] for clinical trial of eliquis vrs study drug. She is currently enrolled in a clinical  trial for a cholesterol drug.   Today, she denies symptoms of palpitations, chest pain, shortness of breath, orthopnea, PND, lower extremity edema, dizziness, presyncope, syncope, or neurologic sequela. The patient is tolerating medications without difficulties and is otherwise without complaint today.   Past Medical History:  Diagnosis Date   Allergic rhinitis 09/28/2008   Breast cancer ([place]) 2004   Left Breast Cancer   CHF (congestive heart failure) ([place]) 11/28/2019   Coronary artery disease    a. 10/2012 ETT: nl; b. 12/2012 Cath: LM nl, LAD min irregs, D1 min irregs, LCX 30p, RCA 40m RPDA 40p, EF 50%.   Diastolic dysfunction    a. 12/2012 Echo: EF 50-55%. No rwma. Gr1 DD. Nl RV fxn. Nl PASP.   Dry eyes 03/25/2021   HLD (hyperlipidemia) 09/28/2008   HTN (hypertension) 06/27/2011   Ischemic cardiomyopathy 12/02/2019   Osteoarthritis 09/28/2008   Osteopenia 11/16/2008   Past Surgical History:  Procedure Laterality Date   ABDOMINAL HYSTERECTOMY  1985   endometriosis; anatomy   BREAST SURGERY  10/2002   CARDIAC CATHETERIZATION  12/19/2012   [facility]; EF 50%   COLONOSCOPY  04/2008   Hyperplastic polyp; repeat in 10 years   CORONARY STENT INTERVENTION N/A 05/03/2020   Procedure: CORONARY STENT INTERVENTION;  Surgeon: ENelva Bush MD;  [facility];  Service:  Cardiovascular;  Laterality: N/A;   HIP FRACTURE SURGERY  06/14/2011   ball replaced (Dr. REarvin Hansen   ICD IMPLANT N/A 03/31/2021   Procedure: ICD IMPLANT;  Surgeon: LVickie Epley MD;  [facility];  Service: Cardiovascular;  Laterality: N/A;   INTRAVASCULAR ULTRASOUND/IVUS N/A 05/03/2020   Procedure: Intravascular Ultrasound/IVUS;  Surgeon: ENelva Bush MD;  [facility];  Service: Cardiovascular;  Laterality: N/A;   LEFT HEART CATH N/A 05/03/2020   Procedure: Left Heart Cath;  Surgeon: ENelva Bush MD;  [facility];  Service: Cardiovascular;  Laterality: N/A;   MASTECTOMY Left 2004   MOLE REMOVAL     RIGHT/LEFT HEART CATH AND CORONARY ANGIOGRAPHY N/A 12/02/2019   Procedure: RIGHT/LEFT HEART CATH AND CORONARY ANGIOGRAPHY;  Surgeon: ENelva Bush MD;  [facility];  Service: Cardiovascular;  Laterality: N/A;   TONSILLECTOMY  1958    Current Outpatient Medications  Medication Sig Dispense Refill   acetaminophen (TYLENOL) 650 MG CR tablet Take 1,300 mg by mouth every 8 (eight) hours as needed for pain.     alendronate (FOSAMAX) 70 MG tablet TAKE 1 TABLET (70 MG TOTAL) BY MOUTH EVERY 7 DAYS WITH FULL GLASS WATER ON EMPTY STOMACH 12 tablet 3   bismuth subsalicylate (PEPTO BISMOL) 262 MG/15ML suspension Take 30 mLs by mouth every 6 (six) hours as needed for indigestion or diarrhea or loose stools.     Calcium Carbonate-Vitamin D 600-400 MG-UNIT tablet Take  1 tablet by mouth daily.      carvedilol (COREG) 12.5 MG tablet TAKE 1 TABLET (12.5 MG TOTAL) BY MOUTH 2 (TWO) TIMES DAILY WITH A MEAL. DOSAGE INCREASE 180 tablet 0   cetirizine (ZYRTEC) 10 MG tablet Take 10 mg by mouth daily as needed for allergies.     clopidogrel (PLAVIX) 75 MG tablet TAKE 1 TABLET BY MOUTH EVERY DAY 90 tablet 2   dapagliflozin propanediol (FARXIGA) 10 MG TABS tablet Take 1 tablet (10 mg total) by mouth daily. PLEASE CALL [phone] TO  SCHEDULE AN APPOINTMENT FOR FURTHER REFILLS 90 tablet 2   FLUoxetine (PROZAC) 20 MG capsule Take 1 capsule by mouth once daily 90 capsule 1   fluticasone (FLONASE) 50 MCG/ACT nasal spray Place 2 sprays into both nostrils daily. 16 g 6   furosemide (LASIX) 40 MG tablet Take 0.5 tablets (20 mg total) by mouth daily. 45 tablet 0   Glycerin-Hypromellose-PEG 400 (DRY EYE RELIEF DROPS) 0.2-0.2-1 % SOLN Place 1 drop into both eyes daily as needed (Dry eye).     Grape Seed Extract 60 MG CAPS Take 60 mg by mouth 2 (two) times daily.     Misc Natural Products (OSTEO BI-FLEX ADV JOINT SHIELD) TABS Take 1 tablet by mouth 2 (two) times daily.     Multiple Vitamin (MULTIVITAMIN WITH MINERALS) TABS tablet Take 1 tablet by mouth daily.     Multiple Vitamins-Minerals (EYE VITAMINS) CAPS Take 1 capsule by mouth daily. Vision Shield     pseudoephedrine-acetaminophen (TYLENOL SINUS) 30-500 MG TABS tablet Take 2 tablets by mouth every 4 (four) hours as needed (Sinus).     rosuvastatin (CRESTOR) 40 MG tablet TAKE 1 TABLET BY MOUTH EVERY DAY 90 tablet 1   sacubitril-valsartan (ENTRESTO) 97-103 MG Take 1 tablet by mouth 2 (two) times daily. 180 tablet 0   spironolactone (ALDACTONE) 25 MG tablet Take 0.5 tablets (12.5 mg total) by mouth daily. 90 tablet 3   Study - [name] - olezarsen 50 mg, 80 mg or placebo SQ injection (PI-Hilty) Inject 80 mg into the skin once. For Investigational Use Only. Injection subcutaneously in protocol approved injection sites (abdomen, thigh or outer area of upper arm). Every 4 weeks. Please contact [facility] for any questions or concerns regarding this medication.     Current Facility-Administered Medications  Medication Dose Route Frequency Provider Last Rate Last Admin   Study - [name] - olezarsen 50 mg, 80 mg or placebo SQ injection (PI-Hilty)  80 mg Subcutaneous Q28 days [name], MD   80 mg at 08/17/22 6222    Allergies  Allergen Reactions   Codeine Other (See  Comments)    Reation:  Hallucinations    Elemental Sulfur Rash   Morphine Other (See Comments)    Reaction:  Hallucinations    Ace Inhibitors Cough   Sulfonamide Derivatives Rash    Social History   Socioeconomic History   Marital status: Widowed    Spouse name: (Widow) to Richardson Landry   Number of children: Not on file   Years of education: Not on file   Highest education level: Not on file  Occupational History   Not on file  Tobacco Use   Smoking status: Former    Packs/day: 0.50    Years: 10.00    Total pack years: 5.00    Types: Cigarettes    Quit date: 66    Years since quitting: 37.0    Passive exposure: Past   Smokeless tobacco: Former  Quit date: 01/24/1986   Tobacco comments:    1989 quit   Vaping Use   Vaping Use: Never used  Substance and Sexual Activity   Alcohol use: No   Drug use: No   Sexual activity: Not on file  Other Topics Concern   Not on file  Social History Narrative   Widowed to husband Renato Gails      No regular exercise   Lives locally w/ tenants that help her w/ chores around the house.   Social Determinants of Health   Financial Resource Strain: Low Risk  (11/10/2021)   Overall Financial Resource Strain (CARDIA)    Difficulty of Paying Living Expenses: Not hard at all  Food Insecurity: No Food Insecurity (11/10/2021)   Hunger Vital Sign    Worried About Running Out of Food in the Last Year: Never true    Ran Out of Food in the Last Year: Never true  Transportation Needs: No Transportation Needs (11/10/2021)   PRAPARE - Hydrologist (Medical): No    Lack of Transportation (Non-Medical): No  Physical Activity: Sufficiently Active (11/10/2021)   Exercise Vital Sign    Days of Exercise per Week: 7 days    Minutes of Exercise per Session: 30 min  Stress: No Stress Concern Present (11/10/2021)   [place]    Feeling of Stress : Not at all   Social Connections: Moderately Integrated (11/10/2021)   Social Connection and Isolation Panel [NHANES]    Frequency of Communication with Friends and Family: Three times a week    Frequency of Social Gatherings with Friends and Family: More than three times a week    Attends Religious Services: More than 4 times per year    Active Member of Clubs or Organizations: No    Attends Music therapist: More than 4 times per year    Marital Status: Widowed  Intimate Partner Violence: Not At Risk (11/10/2021)   Humiliation, Afraid, Rape, and Kick questionnaire    Fear of Current or Ex-Partner: No    Emotionally Abused: No    Physically Abused: No    Sexually Abused: No    Family History  Problem Relation Age of Onset   Heart attack Father 58   Heart disease Father    Sarcoidosis Mother    Ovarian cancer Other        Aunt   Diabetes Other        GP   Breast cancer Sister 38   Cancer Sister        breast   Cancer Paternal Aunt        colon/ovarian    ROS- All systems are reviewed and negative except as per the HPI above  Physical Exam: Vitals:   09/13/22 0942  BP: (!) 156/76  Pulse: 83  Weight: 93.5 kg  Height: '5\' 4"'$  (1.626 m)   Wt Readings from Last 3 Encounters:  09/13/22 93.5 kg  08/14/22 94.1 kg  07/07/22 94.5 kg    Labs: Lab Results  Component Value Date   NA 142 03/31/2022   K 4.1 03/31/2022   CL 104 03/31/2022   CO2 28 03/31/2022   GLUCOSE 107 (H) 03/31/2022   BUN 13 03/31/2022   CREATININE 0.69 03/31/2022   CALCIUM 9.7 03/31/2022   MG 2.4 07/17/2020   Lab Results  Component Value Date   INR 1.0 12/16/2012   Lab Results  Component Value Date   CHOL 163 03/31/2022   HDL 37 (L) 03/31/2022   LDLCALC 68 03/31/2022   TRIG 288 (H) 03/31/2022     GEN- The patient is well appearing, alert and oriented x 3 today.   Head- normocephalic, atraumatic Eyes-  Sclera clear, conjunctiva pink Ears- hearing intact Oropharynx- clear Neck- supple, no  JVP Lymph- no cervical lymphadenopathy Lungs- Clear to ausculation bilaterally, normal work of breathing Heart- Regular rate and rhythm, no murmurs, rubs or gallops, PMI not laterally displaced GI- soft, NT, ND, + BS Extremities- no clubbing, cyanosis, or edema MS- no significant deformity or atrophy Skin- no rash or lesion Psych- euthymic mood, full affect Neuro- strength and sensation are intact  EKG-Vent. rate 83 BPM PR interval 158 ms QRS duration 106 ms QT/QTcB 394/462 ms P-R-T axes 65 -14 -42 Normal sinus rhythm Incomplete left bundle branch block Nonspecific ST and T wave abnormality Abnormal ECG When compared with ECG of 31-Mar-2021 09:37, PREVIOUS ECG IS PRESENT    Assessment and Plan:  1. New onset afib Recent 10 min episode noted on ICD device  Not aware  Continue carvedilol 12.5 mg bid  Continue to monitor thru deice   2. CHA2DS2VASc  score of at least 5 Discussed start of anticoagulation and available LIbrexia trail eliquis vrs study drug Dr. Saunders Revel  did let me know that pt could stop plavix with start of anticoagulation I will forward her name to Dr. Leonie Man for consideration to be included in trial  If not enrolled in trial, I will start eliquis 5 mg bid  Risk vrs benefit of anticoagulation discussed   Geroge Baseman. Cordella Nyquist, [facility] [address] [place], [place] [address] [phone]

## 2022-09-13 NOTE — Research (Signed)
Spoke with Ms Granato to remind her of her appointment tomorrow, gave her the parking code, and instructed to be NPO. Voices understanding.

## 2022-09-14 ENCOUNTER — Encounter: Payer: Medicare Other | Admitting: *Deleted

## 2022-09-14 ENCOUNTER — Other Ambulatory Visit: Payer: Self-pay | Admitting: Internal Medicine

## 2022-09-14 ENCOUNTER — Other Ambulatory Visit: Payer: Self-pay

## 2022-09-14 DIAGNOSIS — Z006 Encounter for examination for normal comparison and control in clinical research program: Secondary | ICD-10-CM

## 2022-09-14 MED ORDER — STUDY - ESSENCE - OLEZARSEN 50 MG, 80 MG OR PLACEBO SQ INJECTION (PI-HILTY)
80.0000 mg | INJECTION | SUBCUTANEOUS | Status: DC
  Administered 2022-09-14: 80 mg via SUBCUTANEOUS
  Filled 2022-09-14: qty 0.8

## 2022-09-14 NOTE — Research (Addendum)
[name] [name] [name] Week 13 Day 85 14-Sep-2022              Hematology: Hematocrit 48%                        '[]'$ Clinically Significant  '[x]'$ Not Clinically Significant RDW 15.5 %                             '[]'$ Clinically Significant  '[x]'$ Not Clinically Significant Lymphocyte % 11.0                   '[]'$ Clinically Significant  '[x]'$ Not Clinically Significant    Urine Chemistry: Protein Creatinine Ratio mg/g          '[]'$ Clinically Significant  '[x]'$ Not Clinically Significant Urine Albumin mg/dL                         '[]'$ Clinically Significant  '[x]'$ Not Clinically Significant   The ST flag: means Sample assessed out of stability window, determined to be analytically valid by technologist, clinical validity to be Determined by investigator.   Any further action needed to be taken per the PI?  No  Thanks .[name] Kitchen Not clinically significant even if out of stability window  [name], MD, [facility], [place] Director of the Advanced Lipid Disorders &  Cardiovascular Risk Reduction Clinic Diplomate of the American Board of Clinical Lipidology Attending Cardiologist  Direct Dial: [phone]  Fax: [phone]  Website:  [email or web address]               Urinalysis: Glucose  300     mg/dL                  '[]'$ Clinically Significant  '[x]'$ Not Clinically Significant Specific Gravity 1.037                   '[]'$ Clinically Significant  '[x]'$ Not Clinically Significant Protein  30 mg/dL                          '[]'$ Clinically Significant  '[x]'$ Not Clinically Significant Amorphous Present                       '[]'$ Clinically Significant  '[x]'$ Not Clinically Significant Squamous Epithelial cells 3-5        '[]'$ Clinically Significant  '[x]'$ Not Clinically Significant Mucus 1+                                       '[]'$ Clinically Significant  '[x]'$ Not Clinically Significant  Urine Chemistry: Protein Creatinine Ratio mg/g          '[]'$ Clinically Significant  '[x]'$ Not Clinically  Significant Urine Albumin mg/dL                        '[]'$ Clinically Significant  '[x]'$ Not Clinically Significant     Any further action needed to be taken per the PI? No  [name], MD, [facility], [place] Director of the Advanced Lipid Disorders &  Cardiovascular Risk Reduction Clinic Diplomate of the American Board of Clinical Lipidology Attending Cardiologist  Direct Dial: [phone]  Fax: [phone]  Website:  [email or web address]    TREATMENT DAY 85 -  STUDY WEEK 13    Subject Number: [id number]              Randomization Number: [id number]            Date: 14-Sep-2022      '[x]'$ Vital Signs Collected - Blood Pressure:129/70 - Heart Rate:72 - Respiratory Rate:16 - Temperature:98.1 - Oxygen Saturation:98%   '[x]'$  Extended Urinalysis   '[x]'$  Lab collection per protocol  '[x]'$  (abdominal pain only) since last visit  '[x]'$  Assessment of ER Visits, Hospitalizations, and Inpatient Days  '[x]'$  Adverse Events and Concomitant Medications  '[x]'$  Diet, Lifestyle, and Alcohol Counseling   '[x]'$  Study Drug: [place] Injection    [name] [name] here for Week 13 Day 85 of [name]. She reports no abd pain, no visits to the Ed or urgent care since last seen. No changes in her medications. VS taken at 0900, Blood work at 0858, and urine obtained at 0907. Injection given in left lower abd at 0931. Tol well. Next visit scheduled for Jan 31 at 0900.   Current Outpatient Medications:    acetaminophen (TYLENOL) 650 MG CR tablet, Take 1,300 mg by mouth every 8 (eight) hours as needed for pain., Disp: , Rfl:    alendronate (FOSAMAX) 70 MG tablet, TAKE 1 TABLET (70 MG TOTAL) BY MOUTH EVERY 7 DAYS WITH FULL GLASS WATER ON EMPTY STOMACH, Disp: 12 tablet, Rfl: 3   bismuth subsalicylate (PEPTO BISMOL) 262 MG/15ML suspension, Take 30 mLs by mouth every 6 (six) hours as needed for indigestion or diarrhea or loose stools., Disp: , Rfl:    Calcium Carbonate-Vitamin D 600-400 MG-UNIT  tablet, Take 1 tablet by mouth daily. , Disp: , Rfl:    carvedilol (COREG) 12.5 MG tablet, TAKE 1 TABLET (12.5 MG TOTAL) BY MOUTH 2 (TWO) TIMES DAILY WITH A MEAL. DOSAGE INCREASE, Disp: 180 tablet, Rfl: 0   cetirizine (ZYRTEC) 10 MG tablet, Take 10 mg by mouth daily as needed for allergies., Disp: , Rfl:    clopidogrel (PLAVIX) 75 MG tablet, TAKE 1 TABLET BY MOUTH EVERY DAY, Disp: 90 tablet, Rfl: 2   dapagliflozin propanediol (FARXIGA) 10 MG TABS tablet, Take 1 tablet (10 mg total) by mouth daily. PLEASE CALL [phone] TO SCHEDULE AN APPOINTMENT FOR FURTHER REFILLS, Disp: 90 tablet, Rfl: 2   FLUoxetine (PROZAC) 20 MG capsule, Take 1 capsule by mouth once daily, Disp: 90 capsule, Rfl: 1   fluticasone (FLONASE) 50 MCG/ACT nasal spray, Place 2 sprays into both nostrils daily., Disp: 16 g, Rfl: 6   furosemide (LASIX) 40 MG tablet, Take 0.5 tablets (20 mg total) by mouth daily., Disp: 45 tablet, Rfl: 0   Glycerin-Hypromellose-PEG 400 (DRY EYE RELIEF DROPS) 0.2-0.2-1 % SOLN, Place 1 drop into both eyes daily as needed (Dry eye)., Disp: , Rfl:    Grape Seed Extract 60 MG CAPS, Take 60 mg by mouth 2 (two) times daily., Disp: , Rfl:    Misc Natural Products (OSTEO BI-FLEX ADV JOINT SHIELD) TABS, Take 1 tablet by mouth 2 (two) times daily., Disp: , Rfl:    Multiple Vitamin (MULTIVITAMIN WITH MINERALS) TABS tablet, Take 1 tablet by mouth daily., Disp: , Rfl:    Multiple Vitamins-Minerals (EYE VITAMINS) CAPS, Take 1 capsule by mouth daily. Vision Shield, Disp: , Rfl:    pseudoephedrine-acetaminophen (TYLENOL SINUS) 30-500 MG TABS tablet, Take 2 tablets by mouth every 4 (four) hours as needed (Sinus)., Disp: , Rfl:    rosuvastatin (CRESTOR) 40 MG tablet, TAKE 1 TABLET BY MOUTH EVERY DAY, Disp: 90  tablet, Rfl: 1   sacubitril-valsartan (ENTRESTO) 97-103 MG, Take 1 tablet by mouth 2 (two) times daily., Disp: 180 tablet, Rfl: 0   spironolactone (ALDACTONE) 25 MG tablet, Take 0.5 tablets (12.5 mg total) by mouth  daily., Disp: 90 tablet, Rfl: 3   Study - [name] - olezarsen 50 mg, 80 mg or placebo SQ injection (PI-Hilty), Inject 80 mg into the skin once. For Investigational Use Only. Injection subcutaneously in protocol approved injection sites (abdomen, thigh or outer area of upper arm). Every 4 weeks. Please contact [facility] for any questions or concerns regarding this medication., Disp: , Rfl:   Current Facility-Administered Medications:    Study - [name] - olezarsen 50 mg, 80 mg or placebo SQ injection (PI-Hilty), 80 mg, Subcutaneous, Q28 days, [name], MD, 80 mg at 09/14/22 [phone]

## 2022-09-19 ENCOUNTER — Other Ambulatory Visit (HOSPITAL_COMMUNITY): Payer: Self-pay | Admitting: *Deleted

## 2022-09-19 MED ORDER — APIXABAN 5 MG PO TABS: 5.0000 mg | ORAL_TABLET | Freq: Two times a day (BID) | ORAL | 3 refills | Status: DC

## 2022-09-28 ENCOUNTER — Ambulatory Visit: Payer: Medicare Other | Attending: Cardiology

## 2022-09-28 DIAGNOSIS — I255 Ischemic cardiomyopathy: Secondary | ICD-10-CM | POA: Diagnosis not present

## 2022-09-28 DIAGNOSIS — Z95810 Presence of automatic (implantable) cardiac defibrillator: Secondary | ICD-10-CM | POA: Diagnosis not present

## 2022-09-28 LAB — CUP PACEART REMOTE DEVICE CHECK
Battery Voltage: 3.09 V
Brady Statistic RV Percent Paced: 0 %
Date Time Interrogation Session: 20240118071117
HighPow Impedance: 82 Ohm
Implantable Lead Connection Status: 753985
Implantable Lead Implant Date: 20220721
Implantable Lead Location: 753860
Implantable Lead Model: 436909
Implantable Lead Serial Number: 81466093
Implantable Pulse Generator Implant Date: 20220721
Lead Channel Impedance Value: 933 Ohm
Lead Channel Pacing Threshold Amplitude: 0.4 V
Lead Channel Pacing Threshold Pulse Width: 0.4 ms
Lead Channel Sensing Intrinsic Amplitude: 20 mV
Lead Channel Sensing Intrinsic Amplitude: 5.8 mV
Lead Channel Setting Pacing Amplitude: 3 V
Lead Channel Setting Pacing Pulse Width: 0.4 ms
Lead Channel Setting Sensing Sensitivity: 0.8 mV
Pulse Gen Model: 429525
Pulse Gen Serial Number: 84852229
Zone Setting Status: 755011

## 2022-10-06 NOTE — Research (Addendum)
[name] [name]  [name] [name]  Qualification 29-Sept-2023    Apolipoprotein B48  4.70 mg/dL       [] Clinically Significant  [x] Not Clinically Significant   [name] Nose, MD, [facility], FACP  [place]  [facility]  Medical Director of the Advanced Lipid Disorders &  Cardiovascular Risk Reduction Clinic Diplomate of the American Board of Clinical Lipidology Attending Cardiologist  Direct Dial: [phone]  Fax: [phone]  Website:  [email or web address]               Lipids:  Lipoprotein a 222.1                      [x] Clinically Significant  [] Not Clinically Significant   Any further action needed to be taken per the PI?  No  [name] Nose, MD, [facility], FACP  [place]  [facility]  Medical Director of the Advanced Lipid Disorders &  Cardiovascular Risk Reduction Clinic Diplomate of the American Board of Clinical Lipidology Attending Cardiologist  Direct Dial: [phone]  Fax: [phone]  Website:  [email or web address]

## 2022-10-10 ENCOUNTER — Encounter: Payer: Self-pay | Admitting: *Deleted

## 2022-10-10 DIAGNOSIS — Z006 Encounter for examination for normal comparison and control in clinical research program: Secondary | ICD-10-CM

## 2022-10-10 NOTE — Research (Signed)
Spoke to [name] [name] to remind her of her appointment tomorrow at 0900 with research. No need to be NPO and gave her the parking code. Voices understanding.

## 2022-10-11 ENCOUNTER — Encounter: Payer: Medicare Other | Admitting: *Deleted

## 2022-10-11 ENCOUNTER — Other Ambulatory Visit: Payer: Self-pay

## 2022-10-11 DIAGNOSIS — Z006 Encounter for examination for normal comparison and control in clinical research program: Secondary | ICD-10-CM

## 2022-10-11 MED ORDER — STUDY - ESSENCE - OLEZARSEN 50 MG, 80 MG OR PLACEBO SQ INJECTION (PI-HILTY)
80.0000 mg | INJECTION | SUBCUTANEOUS | Status: DC
  Administered 2022-10-11: 80 mg via SUBCUTANEOUS
  Filled 2022-10-11: qty 0.8

## 2022-10-11 NOTE — Research (Signed)
TREATMENT DAY 113 - STUDY WEEK 17    Subject Number: [id number]              Randomization Number: [id number]           Date: 11-Oct-2022      '[x]'$ Vital Signs Collected - Blood Pressure:147/58 - Heart Rate:73 - Respiratory Rate:20 - Temperature:97.8 - Oxygen Saturation:97% '[x]'$  (abdominal pain only) since last visit  '[x]'$  Assessment of ER Visits, Hospitalizations, and Inpatient Days  '[x]'$  Adverse Events and Concomitant Medications  '[x]'$  Diet, Lifestyle, and Alcohol Counseling   '[x]'$  Study Drug: [place] Injection   [name] [name] is here for week 64 Day 113 of the [name] research study. She reports no abd pain, no visits to the ED or urgent care since last seen. No changes noted to her medications. VS taken at 0900. Injection given at 0925 in right lower abd . Tol well. Next appointment scheduled for Feb 26 at 0900.   Current Outpatient Medications:    acetaminophen (TYLENOL) 650 MG CR tablet, Take 1,300 mg by mouth every 8 (eight) hours as needed for pain., Disp: , Rfl:    alendronate (FOSAMAX) 70 MG tablet, TAKE 1 TABLET (70 MG TOTAL) BY MOUTH EVERY 7 DAYS WITH FULL GLASS WATER ON EMPTY STOMACH, Disp: 12 tablet, Rfl: 3   apixaban (ELIQUIS) 5 MG TABS tablet, Take 1 tablet (5 mg total) by mouth 2 (two) times daily., Disp: 60 tablet, Rfl: 3   bismuth subsalicylate (PEPTO BISMOL) 262 MG/15ML suspension, Take 30 mLs by mouth every 6 (six) hours as needed for indigestion or diarrhea or loose stools., Disp: , Rfl:    Calcium Carbonate-Vitamin D 600-400 MG-UNIT tablet, Take 1 tablet by mouth daily. , Disp: , Rfl:    carvedilol (COREG) 12.5 MG tablet, TAKE 1 TABLET (12.5 MG TOTAL) BY MOUTH 2 (TWO) TIMES DAILY WITH A MEAL. DOSAGE INCREASE, Disp: 180 tablet, Rfl: 0   cetirizine (ZYRTEC) 10 MG tablet, Take 10 mg by mouth daily as needed for allergies., Disp: , Rfl:    dapagliflozin propanediol (FARXIGA) 10 MG TABS tablet, Take 1 tablet (10 mg total) by mouth daily. PLEASE CALL [phone] TO SCHEDULE AN  APPOINTMENT FOR FURTHER REFILLS, Disp: 90 tablet, Rfl: 2   ENTRESTO 97-103 MG, TAKE 1 TABLET BY MOUTH TWICE A DAY, Disp: 180 tablet, Rfl: 0   FLUoxetine (PROZAC) 20 MG capsule, Take 1 capsule by mouth once daily, Disp: 90 capsule, Rfl: 1   fluticasone (FLONASE) 50 MCG/ACT nasal spray, Place 2 sprays into both nostrils daily., Disp: 16 g, Rfl: 6   furosemide (LASIX) 40 MG tablet, Take 0.5 tablets (20 mg total) by mouth daily., Disp: 45 tablet, Rfl: 0   Glycerin-Hypromellose-PEG 400 (DRY EYE RELIEF DROPS) 0.2-0.2-1 % SOLN, Place 1 drop into both eyes daily as needed (Dry eye)., Disp: , Rfl:    Grape Seed Extract 60 MG CAPS, Take 60 mg by mouth 2 (two) times daily., Disp: , Rfl:    Misc Natural Products (OSTEO BI-FLEX ADV JOINT SHIELD) TABS, Take 1 tablet by mouth 2 (two) times daily., Disp: , Rfl:    Multiple Vitamin (MULTIVITAMIN WITH MINERALS) TABS tablet, Take 1 tablet by mouth daily., Disp: , Rfl:    Multiple Vitamins-Minerals (EYE VITAMINS) CAPS, Take 1 capsule by mouth daily. Vision Shield, Disp: , Rfl:    pseudoephedrine-acetaminophen (TYLENOL SINUS) 30-500 MG TABS tablet, Take 2 tablets by mouth every 4 (four) hours as needed (Sinus)., Disp: , Rfl:    rosuvastatin (  CRESTOR) 40 MG tablet, TAKE 1 TABLET BY MOUTH EVERY DAY, Disp: 90 tablet, Rfl: 1   spironolactone (ALDACTONE) 25 MG tablet, Take 0.5 tablets (12.5 mg total) by mouth daily., Disp: 90 tablet, Rfl: 3   Study - [name] - olezarsen 50 mg, 80 mg or placebo SQ injection (PI-Hilty), Inject 80 mg into the skin once. For Investigational Use Only. Injection subcutaneously in protocol approved injection sites (abdomen, thigh or outer area of upper arm). Every 4 weeks. Please contact [facility] for any questions or concerns regarding this medication., Disp: , Rfl:   Current Facility-Administered Medications:    Study - [name] - olezarsen 50 mg, 80 mg or placebo SQ injection (PI-Hilty), 80 mg, Subcutaneous, Q28 days, Hilty, Nadean Corwin, MD

## 2022-10-19 ENCOUNTER — Encounter (HOSPITAL_COMMUNITY): Payer: Self-pay | Admitting: *Deleted

## 2022-10-19 NOTE — Progress Notes (Signed)
Remote ICD transmission.   

## 2022-10-26 ENCOUNTER — Telehealth: Payer: Self-pay | Admitting: Family Medicine

## 2022-10-26 NOTE — Telephone Encounter (Signed)
LVM for pt to rtn my call to schedule AWV with [facility] call back # [phone] 

## 2022-11-03 ENCOUNTER — Encounter: Payer: Self-pay | Admitting: *Deleted

## 2022-11-03 DIAGNOSIS — Z006 Encounter for examination for normal comparison and control in clinical research program: Secondary | ICD-10-CM

## 2022-11-03 NOTE — Research (Signed)
Message left to remind Ms Rickards of her appointment Monday at 0900. No need to be NPO. Gave her the parking code.

## 2022-11-06 ENCOUNTER — Encounter: Payer: Self-pay | Admitting: *Deleted

## 2022-11-06 DIAGNOSIS — Z006 Encounter for examination for normal comparison and control in clinical research program: Secondary | ICD-10-CM

## 2022-11-06 NOTE — Research (Signed)
Message left for [name] [name] to see if she is still coming in today. Encouraged her to call me back.

## 2022-11-06 NOTE — Research (Signed)
Spoke with Ms Domina states she forgot about her appointment rescheduled her for Wed Feb 28 at 0900.

## 2022-11-08 ENCOUNTER — Encounter: Payer: Medicare Other | Admitting: *Deleted

## 2022-11-08 DIAGNOSIS — Z006 Encounter for examination for normal comparison and control in clinical research program: Secondary | ICD-10-CM

## 2022-11-08 MED ORDER — STUDY - ESSENCE - OLEZARSEN 50 MG, 80 MG OR PLACEBO SQ INJECTION (PI-HILTY)
80.0000 mg | INJECTION | SUBCUTANEOUS | Status: DC
  Administered 2022-11-08: 80 mg via SUBCUTANEOUS
  Filled 2022-11-08: qty 0.8

## 2022-11-08 NOTE — Research (Signed)
TREATMENT DAY 141 - STUDY WEEK 21    Subject Number: [id number]            Randomization Number: [id number]          Date: 08-Nov-2022      '[x]'$ Vital Signs Collected - Blood Pressure:138/56 - Heart Rate:75 - Respiratory Rate:20 - Temperature:97.8 - Oxygen Saturation:98%   '[x]'$   (abdominal pain only) since last visit  '[x]'$  Assessment of ER Visits, Hospitalizations, and Inpatient Days  '[x]'$  Adverse Events and Concomitant Medications  '[x]'$  Diet, Lifestyle, and Alcohol Counseling   '[x]'$  Study Drug: [place] Injection   [name] [name] is here for Week 21 Day 141 of [name] research. She reports no abd pain, no visits to the Ed or Urgent care, and no changes in her medications since last seen. VS taken at 0850. Injection given in left lower and at 0920. Tol well. Scheduled next visit for March 28 at 0900.   Current Outpatient Medications:    acetaminophen (TYLENOL) 650 MG CR tablet, Take 1,300 mg by mouth every 8 (eight) hours as needed for pain., Disp: , Rfl:    alendronate (FOSAMAX) 70 MG tablet, TAKE 1 TABLET (70 MG TOTAL) BY MOUTH EVERY 7 DAYS WITH FULL GLASS WATER ON EMPTY STOMACH, Disp: 12 tablet, Rfl: 3   apixaban (ELIQUIS) 5 MG TABS tablet, Take 1 tablet (5 mg total) by mouth 2 (two) times daily., Disp: 60 tablet, Rfl: 3   bismuth subsalicylate (PEPTO BISMOL) 262 MG/15ML suspension, Take 30 mLs by mouth every 6 (six) hours as needed for indigestion or diarrhea or loose stools., Disp: , Rfl:    Calcium Carbonate-Vitamin D 600-400 MG-UNIT tablet, Take 1 tablet by mouth daily. , Disp: , Rfl:    carvedilol (COREG) 12.5 MG tablet, TAKE 1 TABLET (12.5 MG TOTAL) BY MOUTH 2 (TWO) TIMES DAILY WITH A MEAL. DOSAGE INCREASE, Disp: 180 tablet, Rfl: 0   cetirizine (ZYRTEC) 10 MG tablet, Take 10 mg by mouth daily as needed for allergies., Disp: , Rfl:    dapagliflozin propanediol (FARXIGA) 10 MG TABS tablet, Take 1 tablet (10 mg total) by mouth daily. PLEASE CALL [phone] TO SCHEDULE AN APPOINTMENT FOR  FURTHER REFILLS, Disp: 90 tablet, Rfl: 2   ENTRESTO 97-103 MG, TAKE 1 TABLET BY MOUTH TWICE A DAY, Disp: 180 tablet, Rfl: 0   FLUoxetine (PROZAC) 20 MG capsule, Take 1 capsule by mouth once daily, Disp: 90 capsule, Rfl: 1   fluticasone (FLONASE) 50 MCG/ACT nasal spray, Place 2 sprays into both nostrils daily., Disp: 16 g, Rfl: 6   furosemide (LASIX) 40 MG tablet, Take 0.5 tablets (20 mg total) by mouth daily., Disp: 45 tablet, Rfl: 0   Glycerin-Hypromellose-PEG 400 (DRY EYE RELIEF DROPS) 0.2-0.2-1 % SOLN, Place 1 drop into both eyes daily as needed (Dry eye)., Disp: , Rfl:    Grape Seed Extract 60 MG CAPS, Take 60 mg by mouth 2 (two) times daily., Disp: , Rfl:    Misc Natural Products (OSTEO BI-FLEX ADV JOINT SHIELD) TABS, Take 1 tablet by mouth 2 (two) times daily., Disp: , Rfl:    Multiple Vitamin (MULTIVITAMIN WITH MINERALS) TABS tablet, Take 1 tablet by mouth daily., Disp: , Rfl:    Multiple Vitamins-Minerals (EYE VITAMINS) CAPS, Take 1 capsule by mouth daily. Vision Shield, Disp: , Rfl:    pseudoephedrine-acetaminophen (TYLENOL SINUS) 30-500 MG TABS tablet, Take 2 tablets by mouth every 4 (four) hours as needed (Sinus)., Disp: , Rfl:    rosuvastatin (CRESTOR) 40 MG  tablet, TAKE 1 TABLET BY MOUTH EVERY DAY, Disp: 90 tablet, Rfl: 1   spironolactone (ALDACTONE) 25 MG tablet, Take 0.5 tablets (12.5 mg total) by mouth daily., Disp: 90 tablet, Rfl: 3   Study - [name] - olezarsen 50 mg, 80 mg or placebo SQ injection (PI-Hilty), Inject 80 mg into the skin once. For Investigational Use Only. Injection subcutaneously in protocol approved injection sites (abdomen, thigh or outer area of upper arm). Every 4 weeks. Please contact [facility] for any questions or concerns regarding this medication., Disp: , Rfl:   Current Facility-Administered Medications:    Study - [name] - olezarsen 50 mg, 80 mg or placebo SQ injection (PI-Hilty), 80 mg, Subcutaneous, Q28 days, Hilty, Nadean Corwin, MD

## 2022-11-19 NOTE — Progress Notes (Unsigned)
William Schake T. Loukas Antonson, MD, [place] at [facility] [place], [address]  Phone: [phone]  FAX: [place] - 72 y.o. female  MRN [id number]  Date of Birth: Mar 23, 1951  Date: 11/20/2022  PCP: Owens Loffler, MD  Referral: Owens Loffler, MD  No chief complaint on file.  Patient Care Team: Owens Loffler, MD as PCP - General End, Harrell Gave, MD as PCP - Cardiology (Cardiology) Vickie Epley, MD as PCP - Electrophysiology (Cardiology) Subjective:   [name] [name] is a 72 y.o. pleasant patient who presents for a medicare wellness examination:  Health Maintenance Summary Reviewed and updated, unless pt declines services.  Tobacco History Reviewed. Non-smoker Alcohol: No concerns, no excessive use Exercise Habits: Some activity, rec at least 30 mins 5 times a week STD concerns: none Drug Use: None Birth control method: n/a Menses regular: n/a Lumps or breast concerns: no Breast Cancer Family History: no  She does have coronary disease, as well as some ischemic cardiomyopathy and chronically low heart failure.  She also has hypertension and hyperlipidemia.  Shingrix Td Covid booster  She has not had any laboratories drawn Vit D def  Health Maintenance  Topic Date Due   Zoster Vaccines- Shingrix (1 of 2) Never done   DTaP/Tdap/Td (2 - Tdap) 08/31/2020   COVID-19 Vaccine (4 - 2023-24 season) 05/12/2022   Medicare Annual Wellness (AWV)  11/11/2022   MAMMOGRAM  11/23/2023   COLONOSCOPY (Pts 45-63yr Insurance coverage will need to be confirmed)  02/01/2028   Pneumonia Vaccine 72 Years old  Completed   INFLUENZA VACCINE  Completed   DEXA SCAN  Completed   Hepatitis C Screening  Completed   HPV VACCINES  Aged Out   Immunization History  Administered Date(s) Administered   Fluad Quad(high Dose 65+) 08/19/2019, 07/17/2020, 05/22/2022   Influenza Split 07/10/2011    Influenza Whole 08/11/2008, 10/11/2009, 08/31/2010   Influenza, High Dose Seasonal PF 07/30/2018   Influenza,inj,Quad PF,6+ Mos 05/21/2013, 06/23/2014, 07/12/2015, 12/06/2017   PFIZER(Purple Top)SARS-COV-2 Vaccination 01/05/2020, 01/27/2020   Pfizer Covid-19 Vaccine Bivalent Booster 72yr& up 01/05/2022   Pneumococcal Conjugate-13 12/06/2017   Pneumococcal Polysaccharide-23 08/19/2019   Td 08/31/2010    Patient Active Problem List   Diagnosis Date Noted   Chronic HFrEF (heart failure with reduced ejection fraction) (HCOakdale08/23/2021    Priority: High   Ischemic cardiomyopathy 03/10/2020    Priority: High   Coronary artery disease involving native coronary artery of native heart without angina pectoris     Priority: High   DCIS (ductal carcinoma in situ), LEFT, 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 10/2012 ETT: nl; b. 12/2012 Cath: LM nl, LAD min irregs, D1 min irregs, LCX 30p, RCA 505mPDA 40p, EF 50%.   Diastolic dysfunction    a. 12/2012 Echo: EF 50-55%. No rwma. Gr1 DD. Nl  RV fxn. Nl PASP.   Dry eyes 03/25/2021   HLD (hyperlipidemia) 09/28/2008   HTN (hypertension) 06/27/2011   Ischemic cardiomyopathy 12/02/2019   Osteoarthritis 09/28/2008   Osteopenia 11/16/2008    Past Surgical History:  Procedure Laterality Date   ABDOMINAL HYSTERECTOMY  1985   endometriosis; anatomy   BREAST SURGERY  10/2002   CARDIAC CATHETERIZATION  12/19/2012   [facility]; EF 50%    COLONOSCOPY  04/2008   Hyperplastic polyp; repeat in 10 years   CORONARY STENT INTERVENTION N/A 05/03/2020   Procedure: CORONARY STENT INTERVENTION;  Surgeon: [name], MD;  Location: [place] [facility];  Service: Cardiovascular;  Laterality: N/A;   HIP FRACTURE SURGERY  06/14/2011   ball replaced (Dr. Earvin Hansen)   ICD IMPLANT N/A 03/31/2021   Procedure: ICD IMPLANT;  Surgeon: Vickie Epley, MD;  Location: [place] [facility];  Service: Cardiovascular;  Laterality: N/A;   INTRAVASCULAR ULTRASOUND/IVUS N/A 05/03/2020   Procedure: Intravascular Ultrasound/IVUS;  Surgeon: [name], MD;  Location: [place] [facility];  Service: Cardiovascular;  Laterality: N/A;   LEFT HEART CATH N/A 05/03/2020   Procedure: Left Heart Cath;  Surgeon: [name], MD;  Location: [place] [facility];  Service: Cardiovascular;  Laterality: N/A;   MASTECTOMY Left 2004   MOLE REMOVAL     RIGHT/LEFT HEART CATH AND CORONARY ANGIOGRAPHY N/A 12/02/2019   Procedure: RIGHT/LEFT HEART CATH AND CORONARY ANGIOGRAPHY;  Surgeon: [name], MD;  Location: [place] [facility];  Service: Cardiovascular;  Laterality: N/A;   TONSILLECTOMY  1958    Family History  Problem Relation Age of Onset   Heart attack Father 54   Heart disease Father    Sarcoidosis Mother    Ovarian cancer Other        Aunt   Diabetes Other        GP   Breast cancer Sister 80   Cancer Sister        breast   Cancer Paternal Aunt        colon/ovarian    Social History   Social History Narrative   Widowed to husband Renato Gails      No regular exercise   Lives locally w/ tenants that help her w/ chores around the house.    Past Medical History, Surgical History, Social History, Family History, Problem List, Medications, and Allergies have been reviewed and updated if relevant.  Review of Systems: Pertinent positives are listed above.  Otherwise, a full 14 point review of systems has been done in full and it  is negative except where it is noted positive.  Objective:   There were no vitals taken for this visit.    07/16/2020   10:01 PM 07/17/2020    9:00 AM 10/11/2020   11:22 AM 03/08/2021   12:15 PM 11/10/2021    9:47 AM  Fall Risk  Falls in the past year?   0 1 0  Was there an injury with Fall?   0 0 0  Fall Risk Category Calculator   0 1 0  Fall Risk Category (Retired)   Low Low Low  (RETIRED) Patient Fall Risk Level Low fall risk Low fall risk Low fall risk Low fall risk Low fall risk  Patient at Risk for Falls Due to    History of fall(s) No Fall Risks  Fall risk Follow up   Falls evaluation completed Falls evaluation completed;Education provided Falls prevention discussed   Ideal Body Weight:   No  results found.    11/10/2021    9:48 AM 03/08/2021   12:09 PM 08/02/2020   11:45 AM 06/29/2020    3:05 PM 08/19/2019   10:56 AM  Depression screen PHQ 2/9  Decreased Interest 0 0 0 1 1  Down, Depressed, Hopeless 0 0 0 1 1  PHQ - 2 Score 0 0 0 2 2  Altered sleeping   0 0 1  Tired, decreased energy   '1 3 1  '$ Change in appetite   '1 1 1  '$ Feeling bad or failure about yourself    0 1 0  Trouble concentrating   1 0 0  Moving slowly or fidgety/restless   0 0 0  Suicidal thoughts   0 0 0  PHQ-9 Score   '3 7 5  '$ Difficult doing work/chores   Somewhat difficult  Not difficult at all     GEN: well developed, well nourished, no acute distress Eyes: conjunctiva and lids normal, PERRLA, EOMI ENT: TM clear, nares clear, oral exam WNL Neck: supple, no lymphadenopathy, no thyromegaly, no JVD Pulm: clear to auscultation and percussion, respiratory effort normal CV: regular rate and rhythm, S1-S2, no murmur, rub or gallop, no bruits Chest: no scars, masses, no lumps BREAST: no lumps, no axillary LAD, no nipple discharge GI: soft, non-tender; no hepatosplenomegaly, masses; active bowel sounds all quadrants GU: Normal external female genitalia. Cervix appears intact without lesions or irritation.  Vaginal canal normal without ulceration or lesion. Cervix NT to exam. Ovaries neither enlarged nor tender. (Chaperoned examination by female staff) Lymph: no cervical, axillary or inguinal adenopathy MSK: gait normal, muscle tone and strength WNL, no joint swelling, effusions, discoloration, crepitus  SKIN: clear, good turgor, color WNL, no rashes, lesions, or ulcerations Neuro: normal mental status, normal strength, sensation, and motion Psych: alert; oriented to person, place and time, normally interactive and not anxious or depressed in appearance.  All labs reviewed with patient.    No results found.  Assessment and Plan:     ICD-10-CM   1. Healthcare maintenance  Z00.00       Health Maintenance Exam: The patient's preventative maintenance and recommended screening tests for an annual wellness exam were reviewed in full today. Brought up to date unless services declined.  Counselled on the importance of diet, exercise, and its role in overall health and mortality. The patient's FH and SH was reviewed, including their home life, tobacco status, and drug and alcohol status.  Follow-up in 1 year for physical exam or additional follow-up below.  I have personally reviewed the Medicare Annual Wellness questionnaire and have noted 1. The patient's medical and social history 2. Their use of alcohol, tobacco or illicit drugs 3. Their current medications and supplements 4. The patient's functional ability including ADL's, fall risks, home safety risks and hearing or visual             impairment. 5. Diet and physical activities 6. Evidence for depression or mood disorders 7. Reviewed Updated provider list, see scanned forms and [facility] Snapshot.  8. Reviewed whether or not the patient has HCPOA or living will, and discussed what this means with the patient.  Recommended she bring in a copy for his chart in [facility].  The patients weight, height, BMI and visual acuity have been recorded in  the chart I have made referrals, counseling and provided education to the patient based review of the above and I have provided the pt with a written personalized care plan for preventive  services.  I have provided the patient with a copy of your personalized plan for preventive services. Instructed to take the time to review along with their updated medication list.  Disposition: No follow-ups on file.  Future Appointments  Date Time Provider [place]  11/20/2022  9:00 AM Owens Loffler, MD [facility]  12/07/2022  9:00 AM [place] [facility] None  12/28/2022  7:00 AM [facility] DEVICE REMOTES [facility]  03/29/2023  7:00 AM [facility] DEVICE REMOTES [facility]  06/28/2023  7:00 AM [facility] DEVICE REMOTES [facility]  09/27/2023  7:00 AM [facility] DEVICE REMOTES [facility]  12/27/2023  7:00 AM [facility] DEVICE REMOTES [facility]    No orders of the defined types were placed in this encounter.  There are no discontinued medications. No orders of the defined types were placed in this encounter.   Signed,  Maud Deed. Lashan Gluth, MD   Allergies as of 11/20/2022       Reactions   Codeine Other (See Comments)   Reation:  Hallucinations    Elemental Sulfur Rash   Morphine Other (See Comments)   Reaction:  Hallucinations    Ace Inhibitors Cough   Sulfonamide Derivatives Rash        Medication List        Accurate as of November 19, 2022 12:46 PM. If you have any questions, ask your nurse or doctor.          acetaminophen 650 MG CR tablet Commonly known as: TYLENOL Take 1,300 mg by mouth every 8 (eight) hours as needed for pain.   alendronate 70 MG tablet Commonly known as: FOSAMAX TAKE 1 TABLET (70 MG TOTAL) BY MOUTH EVERY 7 DAYS WITH FULL GLASS WATER ON EMPTY STOMACH   apixaban 5 MG Tabs tablet Commonly known as: Eliquis Take 1 tablet (5 mg total) by mouth  2 (two) times daily.   bismuth subsalicylate [id number] suspension Commonly known as: PEPTO BISMOL Take 30 mLs by mouth every 6 (six) hours as needed for indigestion or diarrhea or loose stools.   Calcium Carbonate-Vitamin D 600-400 MG-UNIT tablet Take 1 tablet by mouth daily.   carvedilol 12.5 MG tablet Commonly known as: COREG TAKE 1 TABLET (12.5 MG TOTAL) BY MOUTH 2 (TWO) TIMES DAILY WITH A MEAL. DOSAGE INCREASE   cetirizine 10 MG tablet Commonly known as: ZYRTEC Take 10 mg by mouth daily as needed for allergies.   dapagliflozin propanediol 10 MG Tabs tablet Commonly known as: FARXIGA Take 1 tablet (10 mg total) by mouth daily. PLEASE CALL [phone] TO SCHEDULE AN APPOINTMENT FOR FURTHER REFILLS   Dry Eye Relief Drops 0.2-0.2-1 % Soln Generic drug: Glycerin-Hypromellose-PEG 400 Place 1 drop into both eyes daily as needed (Dry eye).   Entresto 97-103 MG Generic drug: sacubitril-valsartan TAKE 1 TABLET BY MOUTH TWICE A DAY   [name] olezarsen or placebo 80 mg/0.8 mL SQ injection Inject 80 mg into the skin once. For Investigational Use Only. Injection subcutaneously in protocol approved injection sites (abdomen, thigh or outer area of upper arm). Every 4 weeks. Please contact [facility] for any questions or concerns regarding this medication.   Eye Vitamins Caps Take 1 capsule by mouth daily. Vision Shield   FLUoxetine 20 MG capsule Commonly known as: PROZAC Take 1 capsule by mouth once daily   fluticasone 50 MCG/ACT nasal spray Commonly known as: FLONASE Place 2 sprays into both nostrils daily.   furosemide 40 MG tablet Commonly known as: LASIX Take 0.5 tablets (  20 mg total) by mouth daily.   Grape Seed Extract 60 MG Caps Take 60 mg by mouth 2 (two) times daily.   multivitamin with minerals Tabs tablet Take 1 tablet by mouth daily.   Osteo Bi-Flex Adv Joint Shield Tabs Take 1 tablet by mouth 2 (two) times daily.   pseudoephedrine-acetaminophen  30-500 MG Tabs tablet Commonly known as: TYLENOL SINUS Take 2 tablets by mouth every 4 (four) hours as needed (Sinus).   rosuvastatin 40 MG tablet Commonly known as: CRESTOR TAKE 1 TABLET BY MOUTH EVERY DAY   spironolactone 25 MG tablet Commonly known as: ALDACTONE Take 0.5 tablets (12.5 mg total) by mouth daily.

## 2022-11-20 ENCOUNTER — Encounter: Payer: Self-pay | Admitting: Family Medicine

## 2022-11-20 ENCOUNTER — Ambulatory Visit (INDEPENDENT_AMBULATORY_CARE_PROVIDER_SITE_OTHER): Payer: Medicare Other | Admitting: Family Medicine

## 2022-11-20 VITALS — BP 106/68 | HR 77 | Temp 97.1°F | Ht 64.75 in | Wt 205.2 lb

## 2022-11-20 DIAGNOSIS — E785 Hyperlipidemia, unspecified: Secondary | ICD-10-CM | POA: Diagnosis not present

## 2022-11-20 DIAGNOSIS — Z79899 Other long term (current) drug therapy: Secondary | ICD-10-CM | POA: Diagnosis not present

## 2022-11-20 DIAGNOSIS — R739 Hyperglycemia, unspecified: Secondary | ICD-10-CM | POA: Diagnosis not present

## 2022-11-20 DIAGNOSIS — E559 Vitamin D deficiency, unspecified: Secondary | ICD-10-CM | POA: Diagnosis not present

## 2022-11-20 DIAGNOSIS — Z0000 Encounter for general adult medical examination without abnormal findings: Secondary | ICD-10-CM | POA: Diagnosis not present

## 2022-11-20 LAB — CBC WITH DIFFERENTIAL/PLATELET
Basophils Absolute: 0.1 10*3/uL (ref 0.0–0.1)
Basophils Relative: 0.6 % (ref 0.0–3.0)
Eosinophils Absolute: 0.2 10*3/uL (ref 0.0–0.7)
Eosinophils Relative: 3 % (ref 0.0–5.0)
HCT: 49.9 % — ABNORMAL HIGH (ref 36.0–46.0)
Hemoglobin: 16.2 g/dL — ABNORMAL HIGH (ref 12.0–15.0)
Lymphocytes Relative: 21 % (ref 12.0–46.0)
Lymphs Abs: 1.7 10*3/uL (ref 0.7–4.0)
MCHC: 32.5 g/dL (ref 30.0–36.0)
MCV: 90.7 fl (ref 78.0–100.0)
Monocytes Absolute: 0.8 10*3/uL (ref 0.1–1.0)
Monocytes Relative: 10 % (ref 3.0–12.0)
Neutro Abs: 5.4 10*3/uL (ref 1.4–7.7)
Neutrophils Relative %: 65.4 % (ref 43.0–77.0)
Platelets: 307 10*3/uL (ref 150.0–400.0)
RBC: 5.49 Mil/uL — ABNORMAL HIGH (ref 3.87–5.11)
RDW: 14.6 % (ref 11.5–15.5)
WBC: 8.2 10*3/uL (ref 4.0–10.5)

## 2022-11-20 LAB — LIPID PANEL
Cholesterol: 110 mg/dL (ref 0–200)
HDL: 57 mg/dL (ref >39.00)
LDL Cholesterol: 31 mg/dL (ref 0–99)
NonHDL: 53.37
Total CHOL/HDL Ratio: 2
Triglycerides: 110 mg/dL (ref 0.0–149.0)
VLDL: 22 mg/dL (ref 0.0–40.0)

## 2022-11-20 LAB — HEPATIC FUNCTION PANEL
ALT: 32 U/L (ref 0–35)
AST: 30 U/L (ref 0–37)
Albumin: 4.3 g/dL (ref 3.5–5.2)
Alkaline Phosphatase: 53 U/L (ref 39–117)
Bilirubin, Direct: 0.1 mg/dL (ref 0.0–0.3)
Total Bilirubin: 0.4 mg/dL (ref 0.2–1.2)
Total Protein: 7.5 g/dL (ref 6.0–8.3)

## 2022-11-20 LAB — BASIC METABOLIC PANEL
BUN: 11 mg/dL (ref 6–23)
CO2: 30 mEq/L (ref 19–32)
Calcium: 10.2 mg/dL (ref 8.4–10.5)
Chloride: 100 mEq/L (ref 96–112)
Creatinine, Ser: 0.7 mg/dL (ref 0.40–1.20)
GFR: 87.03 mL/min (ref >60.00)
Glucose, Bld: 110 mg/dL — ABNORMAL HIGH (ref 70–99)
Potassium: 4.3 mEq/L (ref 3.5–5.1)
Sodium: 141 mEq/L (ref 135–145)

## 2022-11-20 LAB — HEMOGLOBIN A1C: Hgb A1c MFr Bld: 5.9 % (ref 4.6–6.5)

## 2022-11-20 LAB — VITAMIN D 25 HYDROXY (VIT D DEFICIENCY, FRACTURES): VITD: 37.73 ng/mL (ref 30.00–100.00)

## 2022-11-20 LAB — TSH: TSH: 2.74 u[IU]/mL (ref 0.35–5.50)

## 2022-11-20 NOTE — Patient Instructions (Signed)
Shingrix - get pharmacy  Tetanus shot, too.   Covid-19 booster

## 2022-11-26 ENCOUNTER — Other Ambulatory Visit: Payer: Self-pay | Admitting: Internal Medicine

## 2022-11-27 NOTE — Telephone Encounter (Signed)
Please schedule 6 month F/U appointment for further refills. Thank you! 

## 2022-12-06 ENCOUNTER — Encounter: Payer: Self-pay | Admitting: *Deleted

## 2022-12-06 DIAGNOSIS — Z006 Encounter for examination for normal comparison and control in clinical research program: Secondary | ICD-10-CM

## 2022-12-06 NOTE — Research (Signed)
Spoke with [name] [name] to remind her of her appointment tomorrow at 0900, reminded to NPO, and gave her the parking  code. Voice understanding.

## 2022-12-07 ENCOUNTER — Other Ambulatory Visit: Payer: Self-pay

## 2022-12-07 ENCOUNTER — Encounter: Payer: Medicare Other | Admitting: *Deleted

## 2022-12-07 DIAGNOSIS — Z006 Encounter for examination for normal comparison and control in clinical research program: Secondary | ICD-10-CM

## 2022-12-07 MED ORDER — STUDY - ESSENCE - OLEZARSEN 50 MG, 80 MG OR PLACEBO SQ INJECTION (PI-HILTY)
80.0000 mg | INJECTION | SUBCUTANEOUS | Status: DC
  Administered 2022-12-07: 80 mg via SUBCUTANEOUS
  Filled 2022-12-07: qty 0.8

## 2022-12-07 NOTE — Research (Addendum)
[name] [name] 07-Dec-2022 Week 25 Day 169 [name]          Chemistry: Sodium  145   mmol/L                         [] Clinically Significant  [x] Not Clinically Significant  Hs-C-Reactive Protein 25.1 mg/L       [] Clinically Significant  [x] Not Clinically Significant   Urinalysis: Glucose >1000  mg/dL                         [] Clinically Significant  [x] Not Clinically Significant Blood  0.20 mg/dL                                 [] Clinically Significant  [x] Not Clinically Significant Protein 30 mg/dL                                   [] Clinically Significant  [x] Not Clinically Significant Nitrite 2+                                                 [] Clinically Significant  [x] Not Clinically Significant Leukocyte Esterase 500                         [] Clinically Significant  [x] Not Clinically Significant Urinary White Blood Cells >75 per HPF  [] Clinically Significant  [x] Not Clinically Significant Urinary Red Blood Cells 50-75                [] Clinically Significant  [x] Not Clinically Significant Bacteria 1+                                              [] Clinically Significant  [x] Not Clinically Significant Transitional Epithelial Cells 1-2               [] Clinically Significant  [x] Not Clinically Significant Mucus 1+                                                 [] Clinically Significant  [x] Not Clinically Significant  Urine Chemistry: Urine Protein 32 mg/dL                             [] Clinically Significant  [x] Not Clinically Significant Urine Albumin 8.05 mg/dL                         [] Clinically Significant  [x] Not Clinically Significant Protein Creatinine Ratio 389 mg/g            [] Clinically Significant  [x] Not Clinically Significant Albumin Creatinine Ratio  98 mg/g           [] Clinically Significant  [x] Not Clinically Significant     Any further action needed to be taken per the PI?  No  [name], MD, [facility], FACP  [facility]  [place]  Director of the [facility] Lipid [facility]  Cardiovascular Risk Reduction Clinic Diplomate of the American Board of Clinical Lipidology Attending Cardiologist  Direct Dial: [phone]  Fax: [phone]  Website:  [email or web address]     TREATMENT DAY 169 - STUDY WEEK 25    Subject Number: [id number]             Randomization Number: [id number]             Date:07-Dec-2022      [x] Vital Signs Collected - Blood Pressure:170/71 - Height: 5 Ft 41/2 in - Weight:204 lbs - Heart Rate:76 - Respiratory Rate:20 - Temperature:97.9 - Oxygen Saturation:94%  [x]  Physical Exam Completed by PI or SUB-I  [x]  Extended Urinalysis   [x]  Lab collection per protocol  [x]   (abdominal pain only) since last visit  [x]  Assessment of ER Visits, Hospitalizations, and Inpatient Days  [x]  Adverse Events and Concomitant Medications  [x]  Diet, Lifestyle, and Alcohol Counseling   [x]  Study Drug: [place] Injection   [name] [name] is here for week 25 Day 169 of [name] research study. She reports no pain, no changes in her meds, and no visits to the ED or Urgent care since last seen. Vs taken at [place], Blood work drawn at 0907, and urine obtained at 0957. Injection given in left lower abd at [place] well. Kit number [id number].  Dr Lia Foyer did exam.   Current Outpatient Medications:    acetaminophen (TYLENOL) 650 MG CR tablet, Take 1,300 mg by mouth every 8 (eight) hours as needed for pain., Disp: , Rfl:    alendronate (FOSAMAX) 70 MG tablet, TAKE 1 TABLET (70 MG TOTAL) BY MOUTH EVERY 7 DAYS WITH FULL GLASS WATER ON EMPTY STOMACH, Disp: 12 tablet, Rfl: 3   apixaban (ELIQUIS) 5 MG TABS tablet, Take 1 tablet (5 mg total) by mouth 2 (two) times daily., Disp: 60 tablet, Rfl: 3   bismuth subsalicylate (PEPTO BISMOL) 262 MG/15ML suspension, Take 30 mLs by mouth every 6 (six) hours as needed for indigestion or diarrhea or loose stools., Disp: , Rfl:    Calcium Carbonate-Vitamin D 600-400 MG-UNIT tablet, Take 1 tablet by  mouth daily. , Disp: , Rfl:    carvedilol (COREG) 12.5 MG tablet, TAKE 1 TABLET (12.5 MG TOTAL) BY MOUTH 2 (TWO) TIMES DAILY WITH A MEAL. DOSAGE INCREASE, Disp: 180 tablet, Rfl: 0   cetirizine (ZYRTEC) 10 MG tablet, Take 10 mg by mouth daily as needed for allergies., Disp: , Rfl:    dapagliflozin propanediol (FARXIGA) 10 MG TABS tablet, Take 1 tablet (10 mg total) by mouth daily. PLEASE CALL [phone] TO SCHEDULE AN APPOINTMENT FOR FURTHER REFILLS, Disp: 90 tablet, Rfl: 2   ENTRESTO 97-103 MG, TAKE 1 TABLET BY MOUTH TWICE A DAY, Disp: 180 tablet, Rfl: 0   FLUoxetine (PROZAC) 20 MG capsule, Take 1 capsule by mouth once daily, Disp: 90 capsule, Rfl: 1   fluticasone (FLONASE) 50 MCG/ACT nasal spray, Place 2 sprays into both nostrils daily., Disp: 16 g, Rfl: 6   furosemide (LASIX) 40 MG tablet, Take 0.5 tablets (20 mg total) by mouth daily., Disp: 45 tablet, Rfl: 0   Glycerin-Hypromellose-PEG 400 (DRY EYE RELIEF DROPS) 0.2-0.2-1 % SOLN, Place 1 drop into both eyes daily as needed (Dry eye)., Disp: , Rfl:    Grape Seed Extract 60 MG CAPS, Take 60 mg by mouth 2 (two) times daily., Disp: , Rfl:    Misc Natural Products (OSTEO BI-FLEX ADV JOINT SHIELD) TABS,  Take 1 tablet by mouth 2 (two) times daily., Disp: , Rfl:    Multiple Vitamin (MULTIVITAMIN WITH MINERALS) TABS tablet, Take 1 tablet by mouth daily., Disp: , Rfl:    Multiple Vitamins-Minerals (EYE VITAMINS) CAPS, Take 1 capsule by mouth daily. Vision Shield, Disp: , Rfl:    pseudoephedrine-acetaminophen (TYLENOL SINUS) 30-500 MG TABS tablet, Take 2 tablets by mouth every 4 (four) hours as needed (Sinus)., Disp: , Rfl:    rosuvastatin (CRESTOR) 40 MG tablet, TAKE 1 TABLET BY MOUTH EVERY DAY, Disp: 90 tablet, Rfl: 1   spironolactone (ALDACTONE) 25 MG tablet, Take 0.5 tablets (12.5 mg total) by mouth daily., Disp: 90 tablet, Rfl: 3   Study - [name] - olezarsen 50 mg, 80 mg or placebo SQ injection (PI-Hilty), Inject 80 mg into the skin once. For  Investigational Use Only. Injection subcutaneously in protocol approved injection sites (abdomen, thigh or outer area of upper arm). Every 4 weeks. Please contact [facility] for any questions or concerns regarding this medication., Disp: , Rfl:   Current Facility-Administered Medications:    Study - [name] - olezarsen 50 mg, 80 mg or placebo SQ injection (PI-Hilty), 80 mg, Subcutaneous, Q28 days, Hilty, Nadean Corwin, MD

## 2022-12-28 ENCOUNTER — Ambulatory Visit: Payer: Medicare Other | Admitting: Internal Medicine

## 2022-12-28 DIAGNOSIS — Z006 Encounter for examination for normal comparison and control in clinical research program: Secondary | ICD-10-CM

## 2022-12-28 LAB — CUP PACEART REMOTE DEVICE CHECK
Date Time Interrogation Session: 20240418070215
Implantable Lead Connection Status: 753985
Implantable Lead Implant Date: 20220721
Implantable Lead Location: 753860
Implantable Lead Model: 436909
Implantable Lead Serial Number: 81466093
Implantable Pulse Generator Implant Date: 20220721
Pulse Gen Model: 429525
Pulse Gen Serial Number: 84852229

## 2022-12-31 NOTE — Progress Notes (Unsigned)
Cardiology Office Note    Date:  01/03/2023   ID:  [name] [name], DOB 10-07-1950, MRN [id number]  PCP:  Hannah Beat, MD  Cardiologist:  Yvonne Kendall, MD  Electrophysiologist:  Lanier Prude, MD   Chief Complaint: Follow-up  History of Present Illness:   [name] [name] is a 72 y.o. 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   Prior LHC in 12/2012 showed nonobstructive disease as outlined below.  Echo at that time showed an EF of 50 to 55%, no regional wall motion abnormalities, grade 1 diastolic dysfunction, normal RV systolic function and PASP.  She was admitted to the hospital in 11/2019 with exertional dyspnea.  High-sensitivity troponin peaked at 415.  Echo demonstrated a new cardiomyopathy with an EF of 25 to 30%, global hypokinesis, mildly dilated LV internal cavity size, mild LVH, grade 1 diastolic dysfunction, normal RV systolic function and ventricular cavity size, mildly dilated left atrium, and an estimated right atrial pressure of 3 mmHg.  R/LHC at that time showed significant two-vessel CAD with diffuse LAD stenosis up to 70 to 80% in the mid/distal vessel and CTO of the mid RCA with left-to-right collaterals.  Moderately elevated left heart, right heart, and pulmonary artery pressures.  Low normal cardiac output/index.  Medical management was advised given small vessel size and diffuse disease involving the LAD and RCA making PCI and surgical revascularization suboptimal.  Repeat echo on GDMT in 02/2020 showed a persistent cardiomyopathy with an EF of 25 to 30%, global hypokinesis, mildly dilated LV internal cavity size, grade 2 diastolic dysfunction, normal RV systolic function and ventricular  cavity size, mildly dilated left atrium, mild mitral regurgitation, and mild to moderate aortic valve sclerosis without evidence of stenosis.  She subsequently underwent repeat LHC in 04/2020 which showed severe two-vessel CAD with diffuse LAD stenosis of up to 70 to 80% that was significant by IVUS, as well as CTO of the RCA.  She underwent successful IVUS guided PCI to the mid/distal LAD using overlapping DES.     She was admitted to the hospital in 07/2020 with acute on chronic combined systolic and diastolic CHF and a large left pleural effusion.  She underwent thoracentesis with 1.4 L of fluid removed with cytology being negative for malignancy.  Echo in 07/2020, again showed a persistent cardiomyopathy with an EF of 25 to 30%, global hypokinesis with severe hypokinesis of the anteroseptal wall and basal anterior wall, mildly dilated LV internal cavity size, normal RV systolic function and ventricular cavity size.   She underwent repeat echo on 02/01/2021, on maximally tolerated GDMT, which showed a slight improvement in her LVSF with an EF of 30 to 35%, global hypokinesis, moderately to severely dilated LV internal cavity size, grade 1 diastolic dysfunction, trivial pericardial effusion, and akinesis of the basal mid inferior wall.  She was evaluated by EP on 03/23/2021 with recommendation to proceed with ICD for primary prevention.  She subsequently underwent implantation of Biotronik ICD on 03/31/2021.   She was last seen in the office in 06/2022 and was without symptoms of angina or cardiac decompensation.  No changes were indicated at that time.  Device interrogation in 07/2022 with new onset A-fib.  She was subsequently evaluated by the A-fib clinic and initiated on  apixaban with discontinuation of clopidogrel to minimize bleeding risk.  She comes in doing well from a cardiac perspective and is without symptoms of angina or cardiac decompensation.  No dyspnea, palpitations, presyncope, or syncope.   She did have an episode of her "bed flying" after getting up in the middle the night at while back consistent with prior episode of vertigo.  No further episodes.  No falls or bleeding concerns.  Adherent to cardiac medications.  Blood pressure is elevated in the office in triage at 160/84 with repeat blood pressure 148/84.  She has not yet taken her morning medications.  Overall, she feels well and does not have any acute cardiac concerns at this time.   Labs independently reviewed: 11/2022 - TSH normal, TC 110, TG 110, HDL 57, LDL 31, A1c 5.9, albumin 4.3, AST/ALT normal, Hgb 16.2, PLT 307, potassium 4.3, BUN 11, serum creatinine 0.7  Past Medical History:  Diagnosis Date   Allergic rhinitis 09/28/2008   Breast cancer 2004   Left Breast Cancer   CHF (congestive heart failure) 11/28/2019   Coronary artery disease    a. 10/2012 ETT: nl; b. 12/2012 Cath: LM nl, LAD min irregs, D1 min irregs, LCX 30p, RCA 64m, RPDA 40p, EF 50%.   Diastolic dysfunction    a. 12/2012 Echo: EF 50-55%. No rwma. Gr1 DD. Nl RV fxn. Nl PASP.   Dry eyes 03/25/2021   HLD (hyperlipidemia) 09/28/2008   HTN (hypertension) 06/27/2011   Ischemic cardiomyopathy 12/02/2019   Osteoarthritis 09/28/2008   Osteopenia 11/16/2008    Past Surgical History:  Procedure Laterality Date   ABDOMINAL HYSTERECTOMY  1985   endometriosis; anatomy   BREAST SURGERY  10/2002   CARDIAC CATHETERIZATION  12/19/2012   [facility]; EF 50%   COLONOSCOPY  04/2008   Hyperplastic polyp; repeat in 10 years   CORONARY STENT INTERVENTION N/A 05/03/2020   Procedure: CORONARY STENT INTERVENTION;  Surgeon: Yvonne Kendall, MD;  Location: [facility] INVASIVE CV LAB;  Service: Cardiovascular;  Laterality: N/A;   CORONARY ULTRASOUND/IVUS N/A 05/03/2020   Procedure: Intravascular Ultrasound/IVUS;  Surgeon: Yvonne Kendall, MD;  Location: [facility] INVASIVE CV LAB;  Service: Cardiovascular;  Laterality: N/A;   HIP FRACTURE SURGERY  06/14/2011   ball replaced (Dr. Marciano Sequin)    ICD IMPLANT N/A 03/31/2021   Procedure: ICD IMPLANT;  Surgeon: Lanier Prude, MD;  Location[facility] INVASIVE CV LAB;  Service: Cardiovascular;  Laterality: N/A;   LEFT HEART CATH N/A 05/03/2020   Procedure: Left Heart Cath;  Surgeon: Yvonne Kendall, MD;  Location: [facility] INVASIVE CV LAB;  Service: Cardiovascular;  Laterality: N/A;   MASTECTOMY Left 2004   MOLE REMOVAL     RIGHT/LEFT HEART CATH AND CORONARY ANGIOGRAPHY N/A 12/02/2019   Procedure: RIGHT/LEFT HEART CATH AND CORONARY ANGIOGRAPHY;  Surgeon: Yvonne Kendall, MD;  Location: [facility] INVASIVE CV LAB;  Service: Cardiovascular;  Laterality: N/A;   TONSILLECTOMY  1958    Current Medications: Current Meds  Medication Sig   acetaminophen (TYLENOL) 650 MG CR tablet Take 1,300 mg by mouth every 8 (eight) hours as needed for pain.   alendronate (FOSAMAX) 70 MG tablet TAKE 1 TABLET (70 MG TOTAL) BY MOUTH EVERY 7 DAYS WITH FULL GLASS WATER ON EMPTY STOMACH   apixaban (ELIQUIS) 5 MG TABS tablet Take 1 tablet (5 mg total) by mouth 2 (two) times daily.   bismuth subsalicylate (PEPTO BISMOL) 262 MG/15ML suspension Take 30 mLs by mouth every 6 (six) hours as needed for indigestion or diarrhea or loose stools.  Calcium Carbonate-Vitamin D 600-400 MG-UNIT tablet Take 1 tablet by mouth daily.    carvedilol (COREG) 12.5 MG tablet TAKE 1 TABLET (12.5 MG TOTAL) BY MOUTH 2 (TWO) TIMES DAILY WITH A MEAL. DOSAGE INCREASE   cetirizine (ZYRTEC) 10 MG tablet Take 10 mg by mouth daily as needed for allergies.   dapagliflozin propanediol (FARXIGA) 10 MG TABS tablet Take 1 tablet (10 mg total) by mouth daily. PLEASE CALL [phone] TO SCHEDULE AN APPOINTMENT FOR FURTHER REFILLS   ENTRESTO 97-103 MG TAKE 1 TABLET BY MOUTH TWICE A DAY   FLUoxetine (PROZAC) 20 MG capsule Take 1 capsule by mouth once daily   fluticasone (FLONASE) 50 MCG/ACT nasal spray Place 2 sprays into both nostrils daily.   furosemide (LASIX) 40 MG tablet Take 0.5 tablets (20 mg total) by mouth  daily.   Glycerin-Hypromellose-PEG 400 (DRY EYE RELIEF DROPS) 0.2-0.2-1 % SOLN Place 1 drop into both eyes daily as needed (Dry eye).   Grape Seed Extract 60 MG CAPS Take 60 mg by mouth 2 (two) times daily.   Misc Natural Products (OSTEO BI-FLEX ADV JOINT SHIELD) TABS Take 1 tablet by mouth 2 (two) times daily.   Multiple Vitamin (MULTIVITAMIN WITH MINERALS) TABS tablet Take 1 tablet by mouth daily.   Multiple Vitamins-Minerals (EYE VITAMINS) CAPS Take 1 capsule by mouth daily. Vision Shield   pseudoephedrine-acetaminophen (TYLENOL SINUS) 30-500 MG TABS tablet Take 2 tablets by mouth every 4 (four) hours as needed (Sinus).   rosuvastatin (CRESTOR) 40 MG tablet TAKE 1 TABLET BY MOUTH EVERY DAY   spironolactone (ALDACTONE) 25 MG tablet Take 0.5 tablets (12.5 mg total) by mouth daily.   Study - [name] - olezarsen 50 mg, 80 mg or placebo SQ injection (PI-Hilty) Inject 80 mg into the skin once. For Investigational Use Only. Injection subcutaneously in protocol approved injection sites (abdomen, thigh or outer area of upper arm). Every 4 weeks. Please contact [facility] for any questions or concerns regarding this medication.   Current Facility-Administered Medications for the 01/03/23 encounter (Office Visit) with Sondra Barges, PA-C  Medication   Study - [name] - olezarsen 50 mg, 80 mg or placebo SQ injection (PI-Hilty)    Allergies:   Codeine, Elemental sulfur, Morphine, Ace inhibitors, and Sulfonamide derivatives   Social History   Socioeconomic History   Marital status: Widowed    Spouse name: (Widow) to [place]   Number of children: Not on file   Years of education: Not on file   Highest education level: Not on file  Occupational History   Not on file  Tobacco Use   Smoking status: Former    Packs/day: 0.50    Years: 10.00    Additional pack years: 0.00    Total pack years: 5.00    Types: Cigarettes    Quit date: 93    Years since quitting: 37.3    Passive exposure: Past    Smokeless tobacco: Former    Quit date: 01/24/1986   Tobacco comments:    1989 quit   Vaping Use   Vaping Use: Never used  Substance and Sexual Activity   Alcohol use: No   Drug use: No   Sexual activity: Not on file  Other Topics Concern   Not on file  Social History Narrative   Widowed to husband Silvestre Gunner      No regular exercise   Lives locally w/ tenants that help her w/ chores around the house.   Social Determinants of Health  Financial Resource Strain: Low Risk  (11/10/2021)   Overall Financial Resource Strain (CARDIA)    Difficulty of Paying Living Expenses: Not hard at all  Food Insecurity: No Food Insecurity (11/10/2021)   Hunger Vital Sign    Worried About Running Out of Food in the Last Year: Never true    Ran Out of Food in the Last Year: Never true  Transportation Needs: No Transportation Needs (11/10/2021)   PRAPARE - Administrator, Civil Service (Medical): No    Lack of Transportation (Non-Medical): No  Physical Activity: Sufficiently Active (11/10/2021)   Exercise Vital Sign    Days of Exercise per Week: 7 days    Minutes of Exercise per Session: 30 min  Stress: No Stress Concern Present (11/10/2021)   [employer] of Occupational Health - Occupational Stress Questionnaire    Feeling of Stress : Not at all  Social Connections: Moderately Integrated (11/10/2021)   Social Connection and Isolation Panel [NHANES]    Frequency of Communication with Friends and Family: Three times a week    Frequency of Social Gatherings with Friends and Family: More than three times a week    Attends Religious Services: More than 4 times per year    Active Member of Clubs or Organizations: No    Attends Engineer, structural: More than 4 times per year    Marital Status: Widowed     Family History:  The patient's family history includes Breast cancer (age of onset: 73) in her sister; Cancer in her paternal aunt and sister; Diabetes in an other  family member; Heart attack (age of onset: 56) in her father; Heart disease in her father; Ovarian cancer in an other family member; Sarcoidosis in her mother.  ROS:   12-point review of systems is negative unless otherwise noted in the HPI.   EKGs/Labs/Other Studies Reviewed:    Studies reviewed were summarized above. The additional studies were reviewed today:  2D echo 02/01/2021: 1. Left ventricular ejection fraction, by estimation, is 30 to 35%. The  left ventricle has moderately decreased function. The left ventricle  demonstrates global hypokinesis. The left ventricular internal cavity size  was moderately to severely dilated.  Left ventricular diastolic parameters are consistent with Grade I  diastolic dysfunction (impaired relaxation). There is akinesis of the left  ventricular, basal-mid inferior wall.  __________   2D echo 07/16/2020: 1. Left ventricular ejection fraction, by estimation, is 25 to 30%. The  left ventricle has severely decreased function. The left ventricle  demonstrates global hypokinesis with severe hypokinesis of the  anterospetal wall and basal anterior wall. The left   ventricular internal cavity size was mildly dilated.   2. Right ventricular systolic function is normal. The right ventricular  size is normal. Tricuspid regurgitation signal is inadequate for assessing  PA pressure.  __________   LHC 05/03/2020: Conclusions: Severe two-vessel coronary artery disease with diffuse LAD disease of up to 70-80% that is significant by IVUS, as well as CTO of RCA. Upper normal LVEDP. Successful IVUS-guided PCI to mid/distal LAD using overlapping Resolute Onyx 2.5 x 38 mm and 2.25 x 34 mm drug-eluting stents with 0% residual stenosis and TIMI-3 flow.   Recommendations: Overnight extended recovery given multivessel CAD and LVEF < 35%. Obtain CXR and UA/urine culture for further evaluation of leukocytosis. Dual antiplatelet therapy with ASA and clopidogrel for  at lest 6 months, ideally longer. Recheck lipid panel in AM and escalate statin therapy, as tolerated, if LDL > 70.  __________   Cardiac MRI 04/17/2020: 1. Severe left ventricular enlargement with severely reduced LV function, LVEF 33%. Akinesis of inferior and inferolateral walls. 2. Post-contrast delayed myocardial enhancement in the inferior and inferolateral walls, with greater than 50% of the myocardial thickness involved suggesting no viability in the RCA territory. 3.  Normal right ventricular size and systolic function. __________   2D echo 03/01/2020: 1. Left ventricular ejection fraction, by estimation, is 25 to 30%. The  left ventricle has severely decreased function. The left ventricle  demonstrates global hypokinesis. The left ventricular internal cavity size  was mildly dilated. Left ventricular  diastolic parameters are consistent with Grade II diastolic dysfunction  (pseudonormalization).   2. Right ventricular systolic function is normal. The right ventricular  size is normal. Tricuspid regurgitation signal is inadequate for assessing  PA pressure.   3. Left atrial size was mildly dilated.   4. The mitral valve is normal in structure. Mild mitral valve  regurgitation. No evidence of mitral stenosis.   5. The aortic valve is normal in structure. Aortic valve regurgitation is  not visualized. Mild to moderate aortic valve sclerosis/calcification is  present, without any evidence of aortic stenosis.   Comparison(s): Previous Echo showed new finding of LV EF 25-30% with  severely decreased function, global HK, and grade I diastolic dysfunction.  __________   [facility] 12/02/2019: Conclusions: Significant two-vessel coronary artery disease with diffuse LAD disease of up to 70-80% in the mid/distal vessel and chronic subtotal occlusion of the mid RCA with left-to-right collaterals. Moderately elevated left heart, right heart, and pulmonary artery pressures. Low normal Fick  cardiac output/index.   Recommendations: Escalate diuresis and optimize evidence-based heart failure therapy. Medical management of coronary artery disease.  Small vessel size and diffuse disease involving LAD and RCA make percutaneous and surgical revascularization suboptimal. __________   2D echo 11/29/2019: 1. Left ventricular ejection fraction, by estimation, is 25 to 30%. The  left ventricle has severely decreased function. The left ventricle  demonstrates global hypokinesis. The left ventricular internal cavity size  was mildly dilated. There is mild left  ventricular hypertrophy. Left ventricular diastolic parameters are  consistent with Grade I diastolic dysfunction (impaired relaxation).   2. Right ventricular systolic function is normal. The right ventricular  size is normal. Tricuspid regurgitation signal is inadequate for assessing  PA pressure.   3. Left atrial size was mildly dilated.   4. The inferior vena cava is normal in size with greater than 50%  respiratory variability, suggesting right atrial pressure of 3 mmHg. __________   [facility] 12/19/2012: Left main normal, LAD minor luminal irregularities, proximal LCx 30% stenosis, mid RCA 50% stenosis, proximal RPDA 40% stenosis, LVEF estimated 50%.  Medical therapy recommended. __________   2D echo 12/10/2012: - Left ventricle: The cavity size was normal. Systolic    function was normal. The estimated ejection fraction was    in the range of 50% to 55%. Wall motion was normal; there    were no regional wall motion abnormalities. Doppler    parameters are consistent with abnormal left ventricular    relaxation (grade 1 diastolic dysfunction).  - Left atrium: The atrium was normal in size.  - Right ventricle: Systolic function was normal.  - Pulmonary arteries: Systolic pressure was within the    normal range.    EKG:  EKG is ordered today.  The EKG ordered today demonstrates NSR, 81 bpm, rare PVC, LVH, nonspecific ST-T  changes  Recent Labs: 11/20/2022: ALT 32; BUN 11;  Creatinine, Ser 0.70; Hemoglobin 16.2; Platelets 307.0; Potassium 4.3; Sodium 141; TSH 2.74  Recent Lipid Panel    Component Value Date/Time   CHOL 110 11/20/2022 0935   CHOL 154 12/22/2021 0900   TRIG 110.0 11/20/2022 0935   HDL 57.00 11/20/2022 0935   HDL 36 (L) 12/22/2021 0900   CHOLHDL 2 11/20/2022 0935   VLDL 22.0 11/20/2022 0935   LDLCALC 31 11/20/2022 0935   LDLCALC 74 12/22/2021 0900   LDLDIRECT 63.9 10/06/2021 1147    PHYSICAL EXAM:    VS:  BP (!) 160/84 (BP Location: Right Arm, Patient Position: Sitting, Cuff Size: Large)   Pulse 81   Ht 5' 4.5" (1.638 m)   Wt 213 lb (96.6 kg)   SpO2 97%   BMI 36.00 kg/m   BMI: Body mass index is 36 kg/m.  Physical Exam Vitals reviewed.  Constitutional:      Appearance: She is well-developed.  HENT:     Head: Normocephalic and atraumatic.  Eyes:     General:        Right eye: No discharge.        Left eye: No discharge.  Neck:     Vascular: No JVD.  Cardiovascular:     Rate and Rhythm: Normal rate and regular rhythm.     Heart sounds: Normal heart sounds, S1 normal and S2 normal. Heart sounds not distant. No midsystolic click and no opening snap. No murmur heard.    No friction rub.  Pulmonary:     Effort: Pulmonary effort is normal. No respiratory distress.     Breath sounds: Normal breath sounds. No decreased breath sounds, wheezing or rales.  Chest:     Chest wall: No tenderness.  Abdominal:     General: There is no distension.  Musculoskeletal:     Cervical back: Normal range of motion.  Skin:    General: Skin is warm and dry.     Nails: There is no clubbing.  Neurological:     Mental Status: She is alert and oriented to person, place, and time.  Psychiatric:        Speech: Speech normal.        Behavior: Behavior normal.        Thought Content: Thought content normal.        Judgment: Judgment normal.     Wt Readings from Last 3 Encounters:  01/03/23  213 lb (96.6 kg)  11/20/22 205 lb 4 oz (93.1 kg)  09/13/22 206 lb 3.2 oz (93.5 kg)     ASSESSMENT & PLAN:   CAD involving the native coronary arteries without angina: She is without symptoms of angina.  Continue aggressive risk factor modification and secondary prevention including apixaban in place of carvedilol given recently diagnosed A-fib along with carvedilol, and rosuvastatin.  No indication for further ischemic testing at this time.  HFrEF secondary to ICM status post ICD: She is euvolemic and well compensated with NYHA class II-III symptoms.  Continue current GDMT including carvedilol, Farxiga, Entresto, spironolactone, and furosemide.  Recent labs showed stable renal function and electrolytes.  No device alarms or discharges.  Follow-up with EP for ongoing management of ICD.  Obtain echo to reevaluate LV systolic function.  PAF: Maintaining sinus rhythm on carvedilol 12.5 mg twice daily.  Noted during device interrogation in 07/2022.  CHA2DS2-VASc at least 5 (CHF, HTN, age x 1, vascular disease, sex category).  She remains on apixaban 5 mg twice daily and does not meet reduced dosing criteria.  Recent labs showed stable hemoglobin, renal function, and electrolytes.  No falls or symptoms concerning for bleeding.  Follow-up with A-fib clinic as directed.  HTN: Blood pressure is elevated to the limits in triage and improved on recheck at 148/84.  She has not yet taken her morning medications.  Given this, we did not titrate medical therapy.  Continue to monitor.  HLD: LDL 68 in 03/2022 with normal AST/ALT at that time.  She remains on rosuvastatin 40 mg.   Disposition: F/u with Dr. Okey Dupre or an APP in 6 months, and EP as directed.    Medication Adjustments/Labs and Tests Ordered: Current medicines are reviewed at length with the patient today.  Concerns regarding medicines are outlined above. Medication changes, Labs and Tests ordered today are summarized above and listed in the Patient  Instructions accessible in Encounters.   Signed, Eula Listen, PA-C 01/03/2023 10:38 AM     [facility] [place] [address] [place], [place] [address] [phone]

## 2023-01-03 ENCOUNTER — Encounter: Payer: Self-pay | Admitting: *Deleted

## 2023-01-03 ENCOUNTER — Encounter: Payer: Self-pay | Admitting: Physician Assistant

## 2023-01-03 ENCOUNTER — Ambulatory Visit: Payer: Medicare Other | Attending: Physician Assistant | Admitting: Physician Assistant

## 2023-01-03 VITALS — BP 160/84 | HR 81 | Ht 64.5 in | Wt 213.0 lb

## 2023-01-03 DIAGNOSIS — Z95810 Presence of automatic (implantable) cardiac defibrillator: Secondary | ICD-10-CM | POA: Diagnosis not present

## 2023-01-03 DIAGNOSIS — I2510 Atherosclerotic heart disease of native coronary artery without angina pectoris: Secondary | ICD-10-CM | POA: Diagnosis not present

## 2023-01-03 DIAGNOSIS — I5022 Chronic systolic (congestive) heart failure: Secondary | ICD-10-CM | POA: Diagnosis not present

## 2023-01-03 DIAGNOSIS — I10 Essential (primary) hypertension: Secondary | ICD-10-CM | POA: Diagnosis not present

## 2023-01-03 DIAGNOSIS — I480 Paroxysmal atrial fibrillation: Secondary | ICD-10-CM | POA: Insufficient documentation

## 2023-01-03 DIAGNOSIS — I255 Ischemic cardiomyopathy: Secondary | ICD-10-CM | POA: Insufficient documentation

## 2023-01-03 DIAGNOSIS — Z006 Encounter for examination for normal comparison and control in clinical research program: Secondary | ICD-10-CM

## 2023-01-03 DIAGNOSIS — E785 Hyperlipidemia, unspecified: Secondary | ICD-10-CM | POA: Diagnosis not present

## 2023-01-03 NOTE — Progress Notes (Unsigned)
Message left to remind [name] [name] of her appointment tomorrow at 0900 with research.  no need to be NPO, and gave her the parking code.

## 2023-01-03 NOTE — Research (Signed)
Message left for [name] [name] to remind her of her appointment tomorrow with research at 0900. Gave her the parking code, and no need to be NPO.

## 2023-01-03 NOTE — Patient Instructions (Signed)
Medication Instructions:  No changes at this time.   *If you need a refill on your cardiac medications before your next appointment, please call your pharmacy*   Lab Work: None  If you have labs (blood work) drawn today and your tests are completely normal, you will receive your results only by: MyChart Message (if you have MyChart) OR A paper copy in the mail If you have any lab test that is abnormal or we need to change your treatment, we will call you to review the results.   Testing/Procedures: Your physician has requested that you have an echocardiogram. Echocardiography is a painless test that uses sound waves to create images of your heart. It provides your doctor with information about the size and shape of your heart and how well your heart's chambers and valves are working. This procedure takes approximately one hour. There are no restrictions for this procedure. Please do NOT wear cologne, perfume, aftershave, or lotions (deodorant is allowed). Please arrive 15 minutes prior to your appointment time.    Follow-Up: At [facility], you and your health needs are our priority.  As part of our continuing mission to provide you with exceptional heart care, we have created designated Provider Care Teams.  These Care Teams include your primary Cardiologist (physician) and Advanced Practice Providers (APPs -  Physician Assistants and Nurse Practitioners) who all work together to provide you with the care you need, when you need it.   Your next appointment:   6 month(s)  Provider:   Christopher End, MD or Ryan Dunn, PA-C     

## 2023-01-04 ENCOUNTER — Other Ambulatory Visit: Payer: Self-pay | Admitting: Family Medicine

## 2023-01-04 ENCOUNTER — Other Ambulatory Visit: Payer: Self-pay

## 2023-01-04 ENCOUNTER — Telehealth: Payer: Self-pay | Admitting: Internal Medicine

## 2023-01-04 ENCOUNTER — Other Ambulatory Visit: Payer: Self-pay | Admitting: Internal Medicine

## 2023-01-04 ENCOUNTER — Encounter: Payer: Medicare Other | Admitting: *Deleted

## 2023-01-04 DIAGNOSIS — Z79899 Other long term (current) drug therapy: Secondary | ICD-10-CM

## 2023-01-04 DIAGNOSIS — Z006 Encounter for examination for normal comparison and control in clinical research program: Secondary | ICD-10-CM

## 2023-01-04 MED ORDER — FUROSEMIDE 40 MG PO TABS
20.0000 mg | ORAL_TABLET | Freq: Every day | ORAL | 0 refills | Status: DC
Start: 2023-01-04 — End: 2023-03-14

## 2023-01-04 MED ORDER — STUDY - ESSENCE - OLEZARSEN 50 MG, 80 MG OR PLACEBO SQ INJECTION (PI-HILTY)
80.0000 mg | INJECTION | SUBCUTANEOUS | Status: DC
  Administered 2023-01-04: 80 mg via SUBCUTANEOUS
  Filled 2023-01-04: qty 0.8

## 2023-01-04 MED ORDER — CARVEDILOL 12.5 MG PO TABS: 12.5000 mg | ORAL_TABLET | Freq: Two times a day (BID) | ORAL | 3 refills | Status: DC

## 2023-01-04 NOTE — Research (Signed)
TREATMENT DAY 197- STUDY WEEK 29    Subject Number: [id number]             Randomization Number: [id number]             Date: 04-Jan-2023      Vital Signs Collected - Blood Pressure:166/74 - Heart Rate:84 - Respiratory Rate:18 - Temperature:98.0 - Oxygen Saturation:97%    (abdominal pain only) since last visit   Assessment of ER Visits, Hospitalizations, and Inpatient Days   Adverse Events and Concomitant Medications   Diet, Lifestyle, and Alcohol Counseling    Study Drug: [place] Injection   [name] [name] is here for week 29 day 197 of [name]. She reports no abd pain, no visits to the Ed or Urgent care, and no changes in her meds. VS taken at 0850. Injection given in left lower abd. at 0917 tol well kit number [id number].Scheduled next visit for May 23 at 0900.   Current Outpatient Medications:    acetaminophen (TYLENOL) 650 MG CR tablet, Take 1,300 mg by mouth every 8 (eight) hours as needed for pain., Disp: , Rfl:    alendronate (FOSAMAX) 70 MG tablet, TAKE 1 TABLET (70 MG TOTAL) BY MOUTH EVERY 7 DAYS WITH FULL GLASS WATER ON EMPTY STOMACH, Disp: 12 tablet, Rfl: 3   apixaban (ELIQUIS) 5 MG TABS tablet, Take 1 tablet (5 mg total) by mouth 2 (two) times daily., Disp: 60 tablet, Rfl: 3   bismuth subsalicylate (PEPTO BISMOL) 262 MG/15ML suspension, Take 30 mLs by mouth every 6 (six) hours as needed for indigestion or diarrhea or loose stools., Disp: , Rfl:    Calcium Carbonate-Vitamin D 600-400 MG-UNIT tablet, Take 1 tablet by mouth daily. , Disp: , Rfl:    carvedilol (COREG) 12.5 MG tablet, TAKE 1 TABLET (12.5 MG TOTAL) BY MOUTH 2 (TWO) TIMES DAILY WITH A MEAL. DOSAGE INCREASE, Disp: 180 tablet, Rfl: 0   cetirizine (ZYRTEC) 10 MG tablet, Take 10 mg by mouth daily as needed for allergies., Disp: , Rfl:    dapagliflozin propanediol (FARXIGA) 10 MG TABS tablet, Take 1 tablet (10 mg total) by mouth daily. PLEASE CALL [phone] TO SCHEDULE AN APPOINTMENT FOR FURTHER REFILLS,  Disp: 90 tablet, Rfl: 2   ENTRESTO 97-103 MG, TAKE 1 TABLET BY MOUTH TWICE A DAY, Disp: 180 tablet, Rfl: 0   FLUoxetine (PROZAC) 20 MG capsule, Take 1 capsule by mouth once daily, Disp: 90 capsule, Rfl: 1   fluticasone (FLONASE) 50 MCG/ACT nasal spray, Place 2 sprays into both nostrils daily., Disp: 16 g, Rfl: 6   furosemide (LASIX) 40 MG tablet, Take 0.5 tablets (20 mg total) by mouth daily., Disp: 45 tablet, Rfl: 0   Glycerin-Hypromellose-PEG 400 (DRY EYE RELIEF DROPS) 0.2-0.2-1 % SOLN, Place 1 drop into both eyes daily as needed (Dry eye)., Disp: , Rfl:    Grape Seed Extract 60 MG CAPS, Take 60 mg by mouth 2 (two) times daily., Disp: , Rfl:    Misc Natural Products (OSTEO BI-FLEX ADV JOINT SHIELD) TABS, Take 1 tablet by mouth 2 (two) times daily., Disp: , Rfl:    Multiple Vitamin (MULTIVITAMIN WITH MINERALS) TABS tablet, Take 1 tablet by mouth daily., Disp: , Rfl:    Multiple Vitamins-Minerals (EYE VITAMINS) CAPS, Take 1 capsule by mouth daily. Vision Shield, Disp: , Rfl:    pseudoephedrine-acetaminophen (TYLENOL SINUS) 30-500 MG TABS tablet, Take 2 tablets by mouth every 4 (four) hours as needed (Sinus)., Disp: , Rfl:    rosuvastatin (CRESTOR) 40 MG  tablet, TAKE 1 TABLET BY MOUTH EVERY DAY, Disp: 90 tablet, Rfl: 1   spironolactone (ALDACTONE) 25 MG tablet, Take 0.5 tablets (12.5 mg total) by mouth daily., Disp: 90 tablet, Rfl: 3   Study - [name] - olezarsen 50 mg, 80 mg or placebo SQ injection (PI-Hilty), Inject 80 mg into the skin once. For Investigational Use Only. Injection subcutaneously in protocol approved injection sites (abdomen, thigh or outer area of upper arm). Every 4 weeks. Please contact  Cardiology for any questions or concerns regarding this medication., Disp: , Rfl:   Current Facility-Administered Medications:    Study - [name] - olezarsen 50 mg, 80 mg or placebo SQ injection (PI-Hilty), 80 mg, Subcutaneous, Q28 days, [name] Nose, MD, 80 mg at 01/04/23 [phone]

## 2023-01-04 NOTE — Telephone Encounter (Signed)
*  STAT* If patient is at the pharmacy, call can be transferred to refill team.   1. Which medications need to be refilled? (please list name of each medication and dose if known) carvedilol (COREG) 12.5 MG tablet   2. Which pharmacy/location (including street and city if local pharmacy) is medication to be sent to? [id number] [place], [place] - [address]    3. Do they need a 30 day or 90 day supply? 90 Day Supply  Pt is currently out of the medication

## 2023-01-04 NOTE — Telephone Encounter (Signed)
Disp Refills Start End   carvedilol (COREG) 12.5 MG tablet 180 tablet 3 01/04/2023    Sig - Route: Take 1 tablet (12.5 mg total) by mouth 2 (two) times daily with a meal. - Oral   Sent to pharmacy as: carvedilol (COREG) 12.5 MG tablet   E-Prescribing Status: Receipt confirmed by pharmacy (01/04/2023  3:09 PM EDT)    Pharmacy  [id number] - [place], [place] - [address]

## 2023-01-06 DIAGNOSIS — Z23 Encounter for immunization: Secondary | ICD-10-CM | POA: Diagnosis not present

## 2023-01-12 ENCOUNTER — Other Ambulatory Visit: Payer: Self-pay | Admitting: Family Medicine

## 2023-01-18 DIAGNOSIS — D225 Melanocytic nevi of trunk: Secondary | ICD-10-CM | POA: Diagnosis not present

## 2023-01-18 DIAGNOSIS — D2272 Melanocytic nevi of left lower limb, including hip: Secondary | ICD-10-CM | POA: Diagnosis not present

## 2023-01-18 DIAGNOSIS — Z86006 Personal history of melanoma in-situ: Secondary | ICD-10-CM | POA: Diagnosis not present

## 2023-01-18 DIAGNOSIS — D2271 Melanocytic nevi of right lower limb, including hip: Secondary | ICD-10-CM | POA: Diagnosis not present

## 2023-01-18 DIAGNOSIS — L821 Other seborrheic keratosis: Secondary | ICD-10-CM | POA: Diagnosis not present

## 2023-01-18 DIAGNOSIS — B372 Candidiasis of skin and nail: Secondary | ICD-10-CM | POA: Diagnosis not present

## 2023-01-18 DIAGNOSIS — D2262 Melanocytic nevi of left upper limb, including shoulder: Secondary | ICD-10-CM | POA: Diagnosis not present

## 2023-01-18 DIAGNOSIS — Z08 Encounter for follow-up examination after completed treatment for malignant neoplasm: Secondary | ICD-10-CM | POA: Diagnosis not present

## 2023-01-18 DIAGNOSIS — D2261 Melanocytic nevi of right upper limb, including shoulder: Secondary | ICD-10-CM | POA: Diagnosis not present

## 2023-01-31 ENCOUNTER — Ambulatory Visit: Payer: Medicare Other | Attending: Physician Assistant

## 2023-01-31 DIAGNOSIS — I255 Ischemic cardiomyopathy: Secondary | ICD-10-CM | POA: Diagnosis not present

## 2023-01-31 LAB — ECHOCARDIOGRAM COMPLETE
AR max vel: 3.07 cm2
AV Area VTI: 2.71 cm2
AV Area mean vel: 3.08 cm2
AV Mean grad: 4 mmHg
AV Peak grad: 8.4 mmHg
Ao pk vel: 1.45 m/s
Area-P 1/2: 4.06 cm2
Calc EF: 45.5 %
S' Lateral: 5.3 cm
Single Plane A2C EF: 46.8 %
Single Plane A4C EF: 44.4 %

## 2023-01-31 MED ORDER — PERFLUTREN LIPID MICROSPHERE
1.0000 mL | INTRAVENOUS | Status: AC | PRN
Start: 2023-01-31 — End: 2023-01-31
  Administered 2023-01-31: 2 mL via INTRAVENOUS

## 2023-02-01 ENCOUNTER — Encounter: Payer: Medicare Other | Admitting: *Deleted

## 2023-02-01 ENCOUNTER — Other Ambulatory Visit: Payer: Self-pay | Admitting: Internal Medicine

## 2023-02-01 ENCOUNTER — Other Ambulatory Visit: Payer: Self-pay

## 2023-02-01 DIAGNOSIS — Z006 Encounter for examination for normal comparison and control in clinical research program: Secondary | ICD-10-CM

## 2023-02-01 MED ORDER — STUDY - ESSENCE - OLEZARSEN 50 MG, 80 MG OR PLACEBO SQ INJECTION (PI-HILTY)
80.0000 mg | INJECTION | SUBCUTANEOUS | Status: DC
  Administered 2023-02-01: 80 mg via SUBCUTANEOUS
  Filled 2023-02-01: qty 0.8

## 2023-02-01 NOTE — Research (Signed)
TREATMENT DAY 225 - STUDY WEEK 33    Subject Number:  [id number]             Randomization Number: [id number]          Date: 01-Feb-2023      [x] Vital Signs Collected - Blood Pressure: 123/69 - Heart Rate:82 - Respiratory Rate:18 - Temperature: 97.8 - Oxygen Saturation: 94%      [x]   (abdominal pain only) since last visit  [x]  Assessment of ER Visits, Hospitalizations, and Inpatient Days  [x]  Adverse Events and Concomitant Medications  [x]  Diet, Lifestyle, and Alcohol Counseling   [x]  Study Drug: [place] Injection   [name] [name] is here for Week 33 Day 225 of [name] research. She reports no abd pain no changes in her meds, and no visits to the Ed or Urgent care since last seen. VS taken at 0845. Injection given in right upper abd at 0913 tol well. Kit number [id number]. Scheduled next visit for June 20 at 0900.    Current Outpatient Medications:    acetaminophen (TYLENOL) 650 MG CR tablet, Take 1,300 mg by mouth every 8 (eight) hours as needed for pain., Disp: , Rfl:    alendronate (FOSAMAX) 70 MG tablet, TAKE 1 TABLET (70 MG TOTAL) BY MOUTH EVERY 7 DAYS WITH FULL GLASS WATER ON EMPTY STOMACH, Disp: 12 tablet, Rfl: 3   apixaban (ELIQUIS) 5 MG TABS tablet, Take 1 tablet (5 mg total) by mouth 2 (two) times daily., Disp: 60 tablet, Rfl: 3   bismuth subsalicylate (PEPTO BISMOL) 262 MG/15ML suspension, Take 30 mLs by mouth every 6 (six) hours as needed for indigestion or diarrhea or loose stools., Disp: , Rfl:    Calcium Carbonate-Vitamin D 600-400 MG-UNIT tablet, Take 1 tablet by mouth daily. , Disp: , Rfl:    carvedilol (COREG) 12.5 MG tablet, Take 1 tablet (12.5 mg total) by mouth 2 (two) times daily with a meal., Disp: 180 tablet, Rfl: 3   cetirizine (ZYRTEC) 10 MG tablet, Take 10 mg by mouth daily as needed for allergies., Disp: , Rfl:    dapagliflozin propanediol (FARXIGA) 10 MG TABS tablet, Take 1 tablet (10 mg total) by mouth daily. PLEASE CALL [phone] TO SCHEDULE AN APPOINTMENT  FOR FURTHER REFILLS, Disp: 90 tablet, Rfl: 2   ENTRESTO 97-103 MG, TAKE 1 TABLET BY MOUTH TWICE A DAY, Disp: 180 tablet, Rfl: 0   FLUoxetine (PROZAC) 20 MG capsule, Take 1 capsule by mouth once daily, Disp: 90 capsule, Rfl: 2   fluticasone (FLONASE) 50 MCG/ACT nasal spray, SPRAY 2 SPRAYS INTO EACH NOSTRIL EVERY DAY, Disp: 16 mL, Rfl: 5   furosemide (LASIX) 40 MG tablet, Take 0.5 tablets (20 mg total) by mouth daily., Disp: 45 tablet, Rfl: 0   Glycerin-Hypromellose-PEG 400 (DRY EYE RELIEF DROPS) 0.2-0.2-1 % SOLN, Place 1 drop into both eyes daily as needed (Dry eye)., Disp: , Rfl:    Grape Seed Extract 60 MG CAPS, Take 60 mg by mouth 2 (two) times daily., Disp: , Rfl:    Misc Natural Products (OSTEO BI-FLEX ADV JOINT SHIELD) TABS, Take 1 tablet by mouth 2 (two) times daily., Disp: , Rfl:    Multiple Vitamin (MULTIVITAMIN WITH MINERALS) TABS tablet, Take 1 tablet by mouth daily., Disp: , Rfl:    Multiple Vitamins-Minerals (EYE VITAMINS) CAPS, Take 1 capsule by mouth daily. Vision Shield, Disp: , Rfl:    pseudoephedrine-acetaminophen (TYLENOL SINUS) 30-500 MG TABS tablet, Take 2 tablets by mouth every 4 (four) hours as needed (  Sinus)., Disp: , Rfl:    rosuvastatin (CRESTOR) 40 MG tablet, TAKE 1 TABLET BY MOUTH EVERY DAY, Disp: 90 tablet, Rfl: 1   spironolactone (ALDACTONE) 25 MG tablet, Take 0.5 tablets (12.5 mg total) by mouth daily., Disp: 90 tablet, Rfl: 3   Study - [name] - olezarsen 50 mg, 80 mg or placebo SQ injection (PI-Hilty), Inject 80 mg into the skin once. For Investigational Use Only. Injection subcutaneously in protocol approved injection sites (abdomen, thigh or outer area of upper arm). Every 4 weeks. Please contact [facility] for any questions or concerns regarding this medication., Disp: , Rfl:   Current Facility-Administered Medications:    Study - [name] - olezarsen 50 mg, 80 mg or placebo SQ injection (PI-Hilty), 80 mg, Subcutaneous, Q28 days, [name] Nose, MD, 80 mg  at 02/01/23 [phone]

## 2023-02-07 ENCOUNTER — Telehealth: Payer: Self-pay | Admitting: Internal Medicine

## 2023-02-07 DIAGNOSIS — I4891 Unspecified atrial fibrillation: Secondary | ICD-10-CM

## 2023-02-07 MED ORDER — APIXABAN 5 MG PO TABS
5.0000 mg | ORAL_TABLET | Freq: Two times a day (BID) | ORAL | 3 refills | Status: DC
Start: 2023-02-07 — End: 2023-05-28

## 2023-02-07 NOTE — Telephone Encounter (Signed)
Refill Request.  

## 2023-02-07 NOTE — Telephone Encounter (Signed)
Prescription refill request for Eliquis received. Indication: a fib Last office visit: 01/03/23 Scr: 0.7 11/20/22 epic Age: 72 Weight: 96 kg

## 2023-02-07 NOTE — Telephone Encounter (Signed)
*  STAT* If patient is at the pharmacy, call can be transferred to refill team.   1. Which medications need to be refilled? (please list name of each medication and dose if known)   apixaban (ELIQUIS) 5 MG TABS tablet   2. Which pharmacy/location (including street and city if local pharmacy) is medication to be sent to?  [id number] - [place], [place] - [address]   3. Do they need a 30 day or 90 day supply?   30 day  Patient stated she is completely out of this medication.

## 2023-02-09 ENCOUNTER — Other Ambulatory Visit: Payer: Self-pay | Admitting: Student

## 2023-02-12 NOTE — Telephone Encounter (Signed)
Please advise on refill.  Med list in chart has Spironolactone 25 mg, 0.5 tab QD.  This was changed on labs drawn 09/29/21.  Labs redrawn on 10/06/21 with advice to continue current medications including decreased dose of spironolactone (12.5 mg daily).  last visit 4/24

## 2023-02-13 NOTE — Telephone Encounter (Signed)
Pt made aware Medication list updated and sent to pharmacy.

## 2023-02-13 NOTE — Telephone Encounter (Signed)
I recommend that she take spironolactone 12.5 mg daily, as documented in our recent notes due to h/o hyperkalemia.

## 2023-02-13 NOTE — Telephone Encounter (Signed)
Per chart review, on 09/29/21 pt spironolactone was decreased to 12.5 mg (1/2 tablet) due to elevated potassium.  Nurse contacted pt to confirm current dose Pt stated she is currently taking 25 mg (1 tablet) daily.  She reported BP this morning was 145/78.  Will forward to MD for recommendations regarding refill

## 2023-03-01 ENCOUNTER — Other Ambulatory Visit: Payer: Self-pay

## 2023-03-01 ENCOUNTER — Encounter: Payer: Medicare Other | Admitting: *Deleted

## 2023-03-01 DIAGNOSIS — Z006 Encounter for examination for normal comparison and control in clinical research program: Secondary | ICD-10-CM

## 2023-03-01 MED ORDER — STUDY - ESSENCE - OLEZARSEN 50 MG, 80 MG OR PLACEBO SQ INJECTION (PI-HILTY)
80.0000 mg | INJECTION | SUBCUTANEOUS | Status: DC
  Administered 2023-03-01: 80 mg via SUBCUTANEOUS
  Filled 2023-03-01: qty 0.8

## 2023-03-01 NOTE — Research (Addendum)
[name] [name] 02-March-2023 [name]  Week 37             Chemistry: Lipase 84 U/L                     [] Clinically Significant  [x] Not Clinically Significant    Hematology:Red  Red Blood Cells  5.43                      [] Clinically Significant  [x] Not Clinically Significant Hematocrit 48 %                              [] Clinically Significant  [x] Not Clinically Significant RDW 15.3 %                                    [] Clinically Significant  [x] Not Clinically Significant  Urinalysis: Glucose  >1000   mg/dL                  [] Clinically Significant  [x] Not Clinically Significant Blood 0.03 mg/dL                            [] Clinically Significant  [x] Not Clinically Significant Protein 10 mg/dL                             [] Clinically Significant  [x] Not Clinically Significant Nitrite 2+                                          [] Clinically Significant  [x] Not Clinically Significant Leukocyte Esterase 500                  [] Clinically Significant  [x] Not Clinically Significant Urinary White Blood cells  50-75      [] Clinically Significant  [x] Not Clinically Significant Urinary Red Blood cells 3-5              [] Clinically Significant  [x] Not Clinically Significant Bacteria 1+                                       [] Clinically Significant  [x] Not Clinically Significant Squamous Epithelial Cells 10-15      [] Clinically Significant  [x] Not Clinically Significant Transitional Epithelial cells 1-2          [] Clinically Significant  [x] Not Clinically Significant Mucus 1+                                           [] Clinically Significant  [x] Not Clinically Significant       Any further action needed to be taken per the PI?  No  Chrystie Nose, MD, [facility], FACP  [place]  [facility]  Medical Director of the [facility]  [facility] Diplomate of the American Board of Clinical Lipidology Attending Cardiologist  Direct Dial:  [phone]  Fax: [phone]  Website:  [email or web address]       TREATMENT DAY 253 - STUDY WEEK 37  Subject Number: [id number]           Randomization Number: [id number]             Date: 01-March-2023      [x] Vital Signs Collected - Blood Pressure: 122/62 - Heart Rate:76 - Respiratory Rate:18 - Temperature:98.2 - Oxygen Saturation: 98%   [x]  Extended Urinalysis   [x]  Lab collection per protocol  [x]   (abdominal pain only) since last visit  [x]  Assessment of ER Visits, Hospitalizations, and Inpatient Days  [x]  Adverse Events and Concomitant Medications  [x]  Diet, Lifestyle, and Alcohol Counseling   [x]  Study Drug: [place] Injection    [name] [name] is here for Week 37 Day 253 of [name]. She reports no abd pain, no changes in her meds, and no visits to the Ed or Urgent care since last seen. VS taken at 0901. Blood drawn at 0910. Urine obtained at 0947. Injection given at 0948 tol well Kit number [id number]. Scheduled next visit for July 18 at 0900.   Current Outpatient Medications:    acetaminophen (TYLENOL) 650 MG CR tablet, Take 1,300 mg by mouth every 8 (eight) hours as needed for pain., Disp: , Rfl:    alendronate (FOSAMAX) 70 MG tablet, TAKE 1 TABLET (70 MG TOTAL) BY MOUTH EVERY 7 DAYS WITH FULL GLASS WATER ON EMPTY STOMACH, Disp: 12 tablet, Rfl: 3   apixaban (ELIQUIS) 5 MG TABS tablet, Take 1 tablet (5 mg total) by mouth 2 (two) times daily., Disp: 60 tablet, Rfl: 3   bismuth subsalicylate (PEPTO BISMOL) 262 MG/15ML suspension, Take 30 mLs by mouth every 6 (six) hours as needed for indigestion or diarrhea or loose stools., Disp: , Rfl:    Calcium Carbonate-Vitamin D 600-400 MG-UNIT tablet, Take 1 tablet by mouth daily. , Disp: , Rfl:    carvedilol (COREG) 12.5 MG tablet, Take 1 tablet (12.5 mg total) by mouth 2 (two) times daily with a meal., Disp: 180 tablet, Rfl: 3   cetirizine (ZYRTEC) 10 MG tablet, Take 10 mg by mouth daily as needed for allergies., Disp: , Rfl:     dapagliflozin propanediol (FARXIGA) 10 MG TABS tablet, Take 1 tablet (10 mg total) by mouth daily. PLEASE CALL [phone] TO SCHEDULE AN APPOINTMENT FOR FURTHER REFILLS, Disp: 90 tablet, Rfl: 2   FLUoxetine (PROZAC) 20 MG capsule, Take 1 capsule by mouth once daily, Disp: 90 capsule, Rfl: 2   fluticasone (FLONASE) 50 MCG/ACT nasal spray, SPRAY 2 SPRAYS INTO EACH NOSTRIL EVERY DAY, Disp: 16 mL, Rfl: 5   furosemide (LASIX) 40 MG tablet, Take 0.5 tablets (20 mg total) by mouth daily., Disp: 45 tablet, Rfl: 0   Glycerin-Hypromellose-PEG 400 (DRY EYE RELIEF DROPS) 0.2-0.2-1 % SOLN, Place 1 drop into both eyes daily as needed (Dry eye)., Disp: , Rfl:    Grape Seed Extract 60 MG CAPS, Take 60 mg by mouth 2 (two) times daily., Disp: , Rfl:    Misc Natural Products (OSTEO BI-FLEX ADV JOINT SHIELD) TABS, Take 1 tablet by mouth 2 (two) times daily., Disp: , Rfl:    Multiple Vitamin (MULTIVITAMIN WITH MINERALS) TABS tablet, Take 1 tablet by mouth daily., Disp: , Rfl:    Multiple Vitamins-Minerals (EYE VITAMINS) CAPS, Take 1 capsule by mouth daily. Vision Shield, Disp: , Rfl:    pseudoephedrine-acetaminophen (TYLENOL SINUS) 30-500 MG TABS tablet, Take 2 tablets by mouth every 4 (four) hours as needed (Sinus)., Disp: , Rfl:    rosuvastatin (CRESTOR) 40 MG tablet, TAKE 1 TABLET BY MOUTH EVERY DAY, Disp: 90  tablet, Rfl: 1   sacubitril-valsartan (ENTRESTO) 97-103 MG, TAKE 1 TABLET BY MOUTH TWICE A DAY, Disp: 60 tablet, Rfl: 3   spironolactone (ALDACTONE) 25 MG tablet, Take 0.5 tablets (12.5 mg total) by mouth daily., Disp: 45 tablet, Rfl: 3   Study - [name] - olezarsen 50 mg, 80 mg or placebo SQ injection (PI-Hilty), Inject 80 mg into the skin once. For Investigational Use Only. Injection subcutaneously in protocol approved injection sites (abdomen, thigh or outer area of upper arm). Every 4 weeks. Please contact [facility] for any questions or concerns regarding this medication., Disp: , Rfl:   Current  Facility-Administered Medications:    Study - [name] - olezarsen 50 mg, 80 mg or placebo SQ injection (PI-Hilty), 80 mg, Subcutaneous, Q28 days, Hilty, Lisette Abu, MD

## 2023-03-05 ENCOUNTER — Ambulatory Visit: Payer: Medicare Other | Admitting: Family Medicine

## 2023-03-06 NOTE — Progress Notes (Signed)
[name] Whelpley T. Lekita Kerekes, MD, CAQ Sports Medicine [facility] at [facility] [address] [place], [address]  Phone: [phone]  FAX: [phone]  [name] [name] - 72 y.o. female  MRN [id number]  Date of Birth: 08-30-1951  Date: 03/08/2023  PCP: Hannah Beat, MD  Referral: Hannah Beat, MD  Chief Complaint  Patient presents with   Foot Swelling    Left   Subjective:   [name] [name] is a 72 y.o. very pleasant female patient with Body mass index is 35.32 kg/m. who presents with the following:  [name] [name] is a very well-known patient, who I have known for many years.  She presents with some left-sided foot swelling.  Has been hurting for about 4 weeks.  Tripped over the threshhold, twisted herself around.  Sat down, and was able to twist around and do that.  She has predominant been having pain in the dorsum of the foot and pain in the midfoot. She denies any forefoot pain.  No pain in the toes or MTP joints. No pain at the lateral ankle, medial ankle or throughout the tibia or fibula at all.  She has had some swelling, but no ecchymosis.  Monday started all over again.     Review of Systems is noted in the HPI, as appropriate  Objective:   BP 120/62 (BP Location: Right Arm, Patient Position: Sitting, Cuff Size: Large)   Pulse 86   Temp (!) 97.4 F (36.3 C) (Temporal)   Ht 5' 4.5" (1.638 m)   Wt 209 lb (94.8 kg)   SpO2 93%   BMI 35.32 kg/m   GEN: No acute distress; alert,appropriate. PULM: Breathing comfortably in no respiratory distress PSYCH: Normally interactive.    FEET: L -Notable history of prior partial amputation due to previous skin cancer. Echymosis: no Edema: no ROM: full LE B Gait: heel toe, non-antalgic MT pain: no Callus pattern: none Lateral Mall: NT Medial Mall: NT  -While there is no specific focal bony tenderness, she does have pain with stress and manipulation of the midfoot. Talus: NT Navicular:  NT Cuboid: NT Calcaneous: NT Metatarsals: NT 5th MT: NT Phalanges: NT Achilles: NT Plantar Fascia: NT Fat Pad: NT Peroneals: NT Post Tib: NT Great Toe: Nml motion Ant Drawer: neg ATFL: NT CFL: NT Deltoid: NT Other foot breakdown: none Long arch: preserved Transverse arch: preserved Hindfoot breakdown: none Sensation: intact   Laboratory and Imaging Data:  Assessment and Plan:     ICD-10-CM   1. Acute foot pain, left  M79.672 DG Foot Complete Left    DG Ankle Complete Left    2. Status post amputation of left foot (HCC)  [id number]      There is no evidence for fracture on foot or ankle x-rays.  Prior partial foot amputation, this has been stable and without much significant event for years now.  History and exam is consistent with midfoot sprain.  I am going to placed the patient in a cam walker boot and have her recheck in 3 weeks.   Orders placed today for conditions managed today: Orders Placed This Encounter  Procedures   DG Foot Complete Left   DG Ankle Complete Left    Disposition: Return in about 3 weeks (around 03/29/2023).  Dragon [employer] One speech-to-text software was used for transcription in this dictation.  Possible transcriptional errors can occur using Animal nutritionist.   Signed,  Elpidio Galea. Glenmore Karl, MD   Outpatient Encounter Medications as of 03/08/2023  Medication Sig   acetaminophen (TYLENOL) 650 MG CR tablet Take 1,300 mg by mouth every 8 (eight) hours as needed for pain.   alendronate (FOSAMAX) 70 MG tablet TAKE 1 TABLET (70 MG TOTAL) BY MOUTH EVERY 7 DAYS WITH FULL GLASS WATER ON EMPTY STOMACH   apixaban (ELIQUIS) 5 MG TABS tablet Take 1 tablet (5 mg total) by mouth 2 (two) times daily.   bismuth subsalicylate (PEPTO BISMOL) 262 MG/15ML suspension Take 30 mLs by mouth every 6 (six) hours as needed for indigestion or diarrhea or loose stools.   Calcium Carbonate-Vitamin D 600-400 MG-UNIT tablet Take 1 tablet by mouth daily.    carvedilol  (COREG) 12.5 MG tablet Take 1 tablet (12.5 mg total) by mouth 2 (two) times daily with a meal.   cetirizine (ZYRTEC) 10 MG tablet Take 10 mg by mouth daily as needed for allergies.   dapagliflozin propanediol (FARXIGA) 10 MG TABS tablet Take 1 tablet (10 mg total) by mouth daily. PLEASE CALL [phone] TO SCHEDULE AN APPOINTMENT FOR FURTHER REFILLS   FLUoxetine (PROZAC) 20 MG capsule Take 1 capsule by mouth once daily   fluticasone (FLONASE) 50 MCG/ACT nasal spray SPRAY 2 SPRAYS INTO EACH NOSTRIL EVERY DAY   furosemide (LASIX) 40 MG tablet Take 0.5 tablets (20 mg total) by mouth daily.   Glycerin-Hypromellose-PEG 400 (DRY EYE RELIEF DROPS) 0.2-0.2-1 % SOLN Place 1 drop into both eyes daily as needed (Dry eye).   Grape Seed Extract 60 MG CAPS Take 60 mg by mouth 2 (two) times daily.   Misc Natural Products (OSTEO BI-FLEX ADV JOINT SHIELD) TABS Take 1 tablet by mouth 2 (two) times daily.   Multiple Vitamin (MULTIVITAMIN WITH MINERALS) TABS tablet Take 1 tablet by mouth daily.   Multiple Vitamins-Minerals (EYE VITAMINS) CAPS Take 1 capsule by mouth daily. Vision Shield   nystatin cream (MYCOSTATIN) Apply topically 2 (two) times daily.   pseudoephedrine-acetaminophen (TYLENOL SINUS) 30-500 MG TABS tablet Take 2 tablets by mouth every 4 (four) hours as needed (Sinus).   rosuvastatin (CRESTOR) 40 MG tablet TAKE 1 TABLET BY MOUTH EVERY DAY   sacubitril-valsartan (ENTRESTO) 97-103 MG TAKE 1 TABLET BY MOUTH TWICE A DAY   spironolactone (ALDACTONE) 25 MG tablet Take 0.5 tablets (12.5 mg total) by mouth daily.   Study - [name] - olezarsen 50 mg, 80 mg or placebo SQ injection (PI-Hilty) Inject 80 mg into the skin once. For Investigational Use Only. Injection subcutaneously in protocol approved injection sites (abdomen, thigh or outer area of upper arm). Every 4 weeks. Please contact [facility] for any questions or concerns regarding this medication.   Facility-Administered Encounter Medications as  of 03/08/2023  Medication   Study - [name] - olezarsen 50 mg, 80 mg or placebo SQ injection (PI-Hilty)

## 2023-03-08 ENCOUNTER — Ambulatory Visit (INDEPENDENT_AMBULATORY_CARE_PROVIDER_SITE_OTHER): Payer: Medicare Other | Admitting: Family Medicine

## 2023-03-08 ENCOUNTER — Ambulatory Visit (INDEPENDENT_AMBULATORY_CARE_PROVIDER_SITE_OTHER)
Admission: RE | Admit: 2023-03-08 | Discharge: 2023-03-08 | Disposition: A | Discharge status: Home / Self Care | Payer: Medicare Other | Source: Ambulatory Visit | Attending: Family Medicine | Admitting: Family Medicine

## 2023-03-08 ENCOUNTER — Encounter: Payer: Self-pay | Admitting: Family Medicine

## 2023-03-08 VITALS — BP 120/62 | HR 86 | Temp 97.4°F | Ht 64.5 in | Wt 209.0 lb

## 2023-03-08 DIAGNOSIS — M25572 Pain in left ankle and joints of left foot: Secondary | ICD-10-CM | POA: Diagnosis not present

## 2023-03-08 DIAGNOSIS — M79672 Pain in left foot: Secondary | ICD-10-CM

## 2023-03-08 DIAGNOSIS — Z89432 Acquired absence of left foot: Secondary | ICD-10-CM

## 2023-03-08 DIAGNOSIS — S99912D Unspecified injury of left ankle, subsequent encounter: Secondary | ICD-10-CM | POA: Diagnosis not present

## 2023-03-14 ENCOUNTER — Other Ambulatory Visit: Payer: Self-pay | Admitting: Internal Medicine

## 2023-03-14 ENCOUNTER — Other Ambulatory Visit: Payer: Self-pay | Admitting: Cardiology

## 2023-03-14 DIAGNOSIS — Z79899 Other long term (current) drug therapy: Secondary | ICD-10-CM

## 2023-03-14 MED ORDER — DAPAGLIFLOZIN PROPANEDIOL 10 MG PO TABS: 10.0000 mg | ORAL_TABLET | Freq: Every day | ORAL | 0 refills | Status: DC

## 2023-03-14 NOTE — Telephone Encounter (Signed)
Please schedule 1 yr F/U with Dr. Lalla Brothers. Thank you!

## 2023-03-29 ENCOUNTER — Ambulatory Visit: Payer: Medicare Other

## 2023-03-29 ENCOUNTER — Encounter: Payer: Self-pay | Admitting: *Deleted

## 2023-03-29 DIAGNOSIS — Z006 Encounter for examination for normal comparison and control in clinical research program: Secondary | ICD-10-CM

## 2023-03-29 LAB — CUP PACEART REMOTE DEVICE CHECK
Date Time Interrogation Session: 20240718075739
Implantable Lead Connection Status: 753985
Implantable Lead Implant Date: 20220721
Implantable Lead Location: 753860
Implantable Lead Model: 436909
Implantable Lead Serial Number: 81466093
Implantable Pulse Generator Implant Date: 20220721
Pulse Gen Model: 429525
Pulse Gen Serial Number: 84852229

## 2023-03-29 NOTE — Research (Signed)
Called [name] [name] she ask me to text her due to her phone is not working. Text sent she will need to reschedule today's appointment.  She will call next week to reschedule.

## 2023-03-31 NOTE — Progress Notes (Deleted)
     T. , MD, CAQ Sports Medicine [facility] at [facility] [address] [place], [address]  Phone: [phone]  FAX: [phone]  [name] [name] - 72 y.o. female  MRN [id number]  Date of Birth: Mar 28, 1951  Date: 04/02/2023  PCP: Hannah Beat, MD  Referral: Hannah Beat, MD  No chief complaint on file.  Subjective:   [name] [name] is a 72 y.o. very pleasant female patient with There is no height or weight on file to calculate BMI. who presents with the following:  Very pleasant patient known very well for many years.  She presents today in follow-up for the left sided midfoot pain, had concern for prior midfoot sprain.  I did place her in a cam walker boot last time she was here 3 weeks ago.    Review of Systems is noted in the HPI, as appropriate  Objective:   There were no vitals taken for this visit.  GEN: No acute distress; alert,appropriate. PULM: Breathing comfortably in no respiratory distress PSYCH: Normally interactive.   Laboratory and Imaging Data:  Assessment and Plan:   ***

## 2023-04-02 ENCOUNTER — Encounter: Payer: Self-pay | Admitting: *Deleted

## 2023-04-02 ENCOUNTER — Ambulatory Visit: Payer: Medicare Other | Admitting: Family Medicine

## 2023-04-02 DIAGNOSIS — Z006 Encounter for examination for normal comparison and control in clinical research program: Secondary | ICD-10-CM

## 2023-04-02 DIAGNOSIS — M79672 Pain in left foot: Secondary | ICD-10-CM

## 2023-04-02 NOTE — Research (Signed)
Text sent to [name] [name] to see if she would be able to come in this week for [name] research.

## 2023-04-06 ENCOUNTER — Encounter: Payer: Self-pay | Admitting: *Deleted

## 2023-04-06 DIAGNOSIS — Z006 Encounter for examination for normal comparison and control in clinical research program: Secondary | ICD-10-CM

## 2023-04-06 NOTE — Research (Signed)
Text message sent to see when [name] [name] can come back in for the next  [name] visit. Asked if she would be able to come Aug 15 or 16?

## 2023-04-08 NOTE — Progress Notes (Signed)
[name] Fichter T. Rein Popov, MD, CAQ Sports Medicine [facility] [address] [place], [address]  Phone: [phone]  FAX: [phone]  [name] [name] - 72 y.o. female  MRN [id number]  Date of Birth: 1951/03/13  Date: 04/09/2023  PCP: [name] Beat, MD  Referral: [name] Beat, MD  Chief Complaint  Patient presents with   Foot Pain    Follow up Left foot   Subjective:   [name] [name] is a 71 y.o. very pleasant female patient with Body mass index is 34.5 kg/m. who presents with the following:  [name] [name] is here for 1 month follow-up with some ongoing acute foot pain.  I did put her in a cam walker boot, and she is here to follow-up about all of this.  At the time of my initial office visit, felt like she probably had a midfoot sprain.  Got a GI virus a week or so ago with a lot of diarrhea.   Getting light-headed at only at night with sitting up after lying down in the bed.   Foot is feeling a lot better after CAM for 4 weeks.  She has some minor pain only in the midfoot.  She is walking okay out of her cam boot.  Review of Systems is noted in the HPI, as appropriate  Objective:   BP 100/70 (BP Location: Right Arm, Patient Position: Sitting, Cuff Size: Large)   Pulse 86   Temp (!) 97 F (36.1 C) (Temporal)   Ht 5' 4.5" (1.638 m)   Wt 204 lb 2 oz (92.6 kg)   SpO2 93%   BMI 34.50 kg/m   GEN: No acute distress; alert,appropriate. PULM: Breathing comfortably in no respiratory distress PSYCH: Normally interactive.   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  At this point, she is essentially nontender with only minimal pain with squeezing the midfoot. Nontender at the talus, navicular, cuboid, cuneiforms, medial lateral malleolus Nontender at the ATFL, CFL, or deltoid ligaments Otherwise bony anatomy is nontender  Laboratory and Imaging Data:  Assessment and Plan:     ICD-10-CM   1. Acute foot pain, left   M79.672      Resolved midfoot sprain.  Wean out of cam walker boot over the next week.  Follow-up as needed  Dragon [employer] One speech-to-text software was used for transcription in this dictation.  Possible transcriptional errors can occur using Animal nutritionist.   Signed,  [name] [name]. [name] Pixler, MD   Outpatient Encounter Medications as of 04/09/2023  Medication Sig   acetaminophen (TYLENOL) 650 MG CR tablet Take 1,300 mg by mouth every 8 (eight) hours as needed for pain.   alendronate (FOSAMAX) 70 MG tablet TAKE 1 TABLET (70 MG TOTAL) BY MOUTH EVERY 7 DAYS WITH FULL GLASS WATER ON EMPTY STOMACH   apixaban (ELIQUIS) 5 MG TABS tablet Take 1 tablet (5 mg total) by mouth 2 (two) times daily.   bismuth subsalicylate (PEPTO BISMOL) 262 MG/15ML suspension Take 30 mLs by mouth every 6 (six) hours as needed for indigestion or diarrhea or loose stools.   Calcium Carbonate-Vitamin D 600-400 MG-UNIT tablet Take 1 tablet by mouth daily.    carvedilol (COREG) 12.5 MG tablet Take 1 tablet (12.5 mg total) by mouth 2 (two) times daily with a meal.   cetirizine (ZYRTEC) 10 MG tablet Take 10 mg by mouth daily as needed for allergies.   dapagliflozin propanediol (FARXIGA) 10 MG TABS tablet Take 1 tablet (10 mg  total) by mouth daily. Pt needs to keep July appt for further refills.   FLUoxetine (PROZAC) 20 MG capsule Take 1 capsule by mouth once daily   fluticasone (FLONASE) 50 MCG/ACT nasal spray SPRAY 2 SPRAYS INTO EACH NOSTRIL EVERY DAY   furosemide (LASIX) 40 MG tablet TAKE 1/2 TABLET BY MOUTH DAILY   Glycerin-Hypromellose-PEG 400 (DRY EYE RELIEF DROPS) 0.2-0.2-1 % SOLN Place 1 drop into both eyes daily as needed (Dry eye).   Grape Seed Extract 60 MG CAPS Take 60 mg by mouth 2 (two) times daily.   Misc Natural Products (OSTEO BI-FLEX ADV JOINT SHIELD) TABS Take 1 tablet by mouth 2 (two) times daily.   Multiple Vitamin (MULTIVITAMIN WITH MINERALS) TABS tablet Take 1 tablet by mouth daily.   Multiple  Vitamins-Minerals (EYE VITAMINS) CAPS Take 1 capsule by mouth daily. Vision Shield   nystatin cream (MYCOSTATIN) Apply topically 2 (two) times daily.   pseudoephedrine-acetaminophen (TYLENOL SINUS) 30-500 MG TABS tablet Take 2 tablets by mouth every 4 (four) hours as needed (Sinus).   rosuvastatin (CRESTOR) 40 MG tablet TAKE 1 TABLET BY MOUTH EVERY DAY   sacubitril-valsartan (ENTRESTO) 97-103 MG TAKE 1 TABLET BY MOUTH TWICE A DAY   spironolactone (ALDACTONE) 25 MG tablet Take 0.5 tablets (12.5 mg total) by mouth daily.   Study - [name] - olezarsen 50 mg, 80 mg or placebo SQ injection (PI-Hilty) Inject 80 mg into the skin once. For Investigational Use Only. Injection subcutaneously in protocol approved injection sites (abdomen, thigh or outer area of upper arm). Every 4 weeks. Please contact [facility] for any questions or concerns regarding this medication.   Facility-Administered Encounter Medications as of 04/09/2023  Medication   [DISCONTINUED] Study - [name] - olezarsen 50 mg, 80 mg or placebo SQ injection (PI-Hilty)

## 2023-04-09 ENCOUNTER — Encounter: Payer: Self-pay | Admitting: Family Medicine

## 2023-04-09 ENCOUNTER — Other Ambulatory Visit: Payer: Self-pay

## 2023-04-09 ENCOUNTER — Encounter: Payer: Medicare Other | Admitting: *Deleted

## 2023-04-09 ENCOUNTER — Ambulatory Visit (INDEPENDENT_AMBULATORY_CARE_PROVIDER_SITE_OTHER): Payer: Medicare Other | Admitting: Family Medicine

## 2023-04-09 VITALS — BP 100/70 | HR 86 | Temp 97.0°F | Ht 64.5 in | Wt 204.1 lb

## 2023-04-09 DIAGNOSIS — Z006 Encounter for examination for normal comparison and control in clinical research program: Secondary | ICD-10-CM

## 2023-04-09 DIAGNOSIS — M79672 Pain in left foot: Secondary | ICD-10-CM | POA: Diagnosis not present

## 2023-04-09 MED ORDER — STUDY - ESSENCE - OLEZARSEN 50 MG, 80 MG OR PLACEBO SQ INJECTION (PI-HILTY)
80.0000 mg | INJECTION | SUBCUTANEOUS | Status: DC
  Administered 2023-04-09: 80 mg via SUBCUTANEOUS
  Filled 2023-04-09: qty 0.8

## 2023-04-09 NOTE — Research (Signed)
TREATMENT DAY 281- STUDY WEEK 41    Subject Number: [id number]             Randomization Number:[id number]             Date:09-April-2023      [x] Vital Signs Collected - Blood Pressure:129/71 - Heart Rate:85 - Respiratory Rate:18 - Temperature:97.4 - Oxygen Saturation:95%    [x]   (abdominal pain only) since last visit  [x]  Assessment of ER Visits, Hospitalizations, and Inpatient Days  [x]  Adverse Events and Concomitant Medications  [x]  Diet, Lifestyle, and Alcohol Counseling   [x]  Study Drug: [place] Injection   [name] [name] is here for Week 41 day 281 of [name] research. She reports no abd pain, no changes in her meds, and no visits to the Ed or Urgent care since last seen. She does report seeing her Dr for left foot pain and swelling. She is wearing a boot to her left foot today.  She states her foot is getting better. Vs taken at 0810. Injection given in left lower abd at 0843. Tol well kit number [id number]. Scheduled next visit for Aug 15 at 0900.   Current Outpatient Medications:    acetaminophen (TYLENOL) 650 MG CR tablet, Take 1,300 mg by mouth every 8 (eight) hours as needed for pain., Disp: , Rfl:    alendronate (FOSAMAX) 70 MG tablet, TAKE 1 TABLET (70 MG TOTAL) BY MOUTH EVERY 7 DAYS WITH FULL GLASS WATER ON EMPTY STOMACH, Disp: 12 tablet, Rfl: 3   apixaban (ELIQUIS) 5 MG TABS tablet, Take 1 tablet (5 mg total) by mouth 2 (two) times daily., Disp: 60 tablet, Rfl: 3   bismuth subsalicylate (PEPTO BISMOL) 262 MG/15ML suspension, Take 30 mLs by mouth every 6 (six) hours as needed for indigestion or diarrhea or loose stools., Disp: , Rfl:    Calcium Carbonate-Vitamin D 600-400 MG-UNIT tablet, Take 1 tablet by mouth daily. , Disp: , Rfl:    carvedilol (COREG) 12.5 MG tablet, Take 1 tablet (12.5 mg total) by mouth 2 (two) times daily with a meal., Disp: 180 tablet, Rfl: 3   cetirizine (ZYRTEC) 10 MG tablet, Take 10 mg by mouth daily as needed for allergies., Disp: , Rfl:     dapagliflozin propanediol (FARXIGA) 10 MG TABS tablet, Take 1 tablet (10 mg total) by mouth daily. Pt needs to keep July appt for further refills., Disp: 30 tablet, Rfl: 0   FLUoxetine (PROZAC) 20 MG capsule, Take 1 capsule by mouth once daily, Disp: 90 capsule, Rfl: 2   fluticasone (FLONASE) 50 MCG/ACT nasal spray, SPRAY 2 SPRAYS INTO EACH NOSTRIL EVERY DAY, Disp: 16 mL, Rfl: 5   furosemide (LASIX) 40 MG tablet, TAKE 1/2 TABLET BY MOUTH DAILY, Disp: 45 tablet, Rfl: 0   Glycerin-Hypromellose-PEG 400 (DRY EYE RELIEF DROPS) 0.2-0.2-1 % SOLN, Place 1 drop into both eyes daily as needed (Dry eye)., Disp: , Rfl:    Grape Seed Extract 60 MG CAPS, Take 60 mg by mouth 2 (two) times daily., Disp: , Rfl:    Misc Natural Products (OSTEO BI-FLEX ADV JOINT SHIELD) TABS, Take 1 tablet by mouth 2 (two) times daily., Disp: , Rfl:    Multiple Vitamin (MULTIVITAMIN WITH MINERALS) TABS tablet, Take 1 tablet by mouth daily., Disp: , Rfl:    Multiple Vitamins-Minerals (EYE VITAMINS) CAPS, Take 1 capsule by mouth daily. Vision Shield, Disp: , Rfl:    nystatin cream (MYCOSTATIN), Apply topically 2 (two) times daily., Disp: , Rfl:    pseudoephedrine-acetaminophen (TYLENOL  SINUS) 30-500 MG TABS tablet, Take 2 tablets by mouth every 4 (four) hours as needed (Sinus)., Disp: , Rfl:    rosuvastatin (CRESTOR) 40 MG tablet, TAKE 1 TABLET BY MOUTH EVERY DAY, Disp: 90 tablet, Rfl: 1   sacubitril-valsartan (ENTRESTO) 97-103 MG, TAKE 1 TABLET BY MOUTH TWICE A DAY, Disp: 60 tablet, Rfl: 3   spironolactone (ALDACTONE) 25 MG tablet, Take 0.5 tablets (12.5 mg total) by mouth daily., Disp: 45 tablet, Rfl: 3   Study - [name] - olezarsen 50 mg, 80 mg or placebo SQ injection (PI-Hilty), Inject 80 mg into the skin once. For Investigational Use Only. Injection subcutaneously in protocol approved injection sites (abdomen, thigh or outer area of upper arm). Every 4 weeks. Please contact [facility] for any questions or concerns regarding  this medication., Disp: , Rfl:   Current Facility-Administered Medications:    Study - [name] - olezarsen 50 mg, 80 mg or placebo SQ injection (PI-Hilty), 80 mg, Subcutaneous, Q28 days, , 80 mg at 04/09/23 4098

## 2023-04-19 ENCOUNTER — Other Ambulatory Visit: Payer: Self-pay | Admitting: Internal Medicine

## 2023-04-25 ENCOUNTER — Other Ambulatory Visit: Payer: Self-pay | Admitting: Family Medicine

## 2023-04-25 DIAGNOSIS — Z1231 Encounter for screening mammogram for malignant neoplasm of breast: Secondary | ICD-10-CM

## 2023-04-26 ENCOUNTER — Encounter: Payer: Medicare Other | Admitting: *Deleted

## 2023-04-26 ENCOUNTER — Other Ambulatory Visit: Payer: Self-pay

## 2023-04-26 DIAGNOSIS — Z006 Encounter for examination for normal comparison and control in clinical research program: Secondary | ICD-10-CM

## 2023-04-26 MED ORDER — STUDY - ESSENCE - OLEZARSEN 50 MG, 80 MG OR PLACEBO SQ INJECTION (PI-HILTY)
80.0000 mg | INJECTION | SUBCUTANEOUS | Status: DC
  Administered 2023-04-26: 80 mg via SUBCUTANEOUS
  Filled 2023-04-26: qty 0.8

## 2023-04-26 NOTE — Research (Signed)
     TREATMENT DAY 309 - STUDY WEEK 45    Subject Number: [id number]              Randomization Number: [id number]             Date:26-Apr-2023      [x] Vital Signs Collected - Blood Pressure:127/69 - Heart Rate:74 - Respiratory Rate:16 - Temperature:98.0 - Oxygen Saturation:94%     [x]  (abdominal pain only) since last visit  [x]  Assessment of ER Visits, Hospitalizations, and Inpatient Days  [x]  Adverse Events and Concomitant Medications  [x]  Diet, Lifestyle, and Alcohol Counseling   [x]  Study Drug: [place] Injection   Ms Nakao is here for week 45 day 309 of [name] Research. She reports no abd pain, or any other s&s of pancreatitis, no ed or urgent care visits, and no changes in her meds since  last seen. Vs taken at 0900. Scheduled next visit for Sept 12 at 0900. Injection given in right lower abd at 0942 tol well kit number [id number].

## 2023-05-07 ENCOUNTER — Telehealth: Payer: Self-pay

## 2023-05-07 NOTE — Telephone Encounter (Signed)
Biotronik remote monitor disconnected. Patient notified and advised to plug in. Voiced understanding and appreciative of call.

## 2023-05-08 NOTE — Telephone Encounter (Signed)
Monitor connected 05/08/23.

## 2023-05-17 ENCOUNTER — Ambulatory Visit
Admission: RE | Admit: 2023-05-17 | Discharge: 2023-05-17 | Disposition: A | Discharge status: Home / Self Care | Payer: Medicare Other | Source: Ambulatory Visit | Attending: Family Medicine | Admitting: Family Medicine

## 2023-05-17 DIAGNOSIS — Z1231 Encounter for screening mammogram for malignant neoplasm of breast: Secondary | ICD-10-CM

## 2023-05-24 ENCOUNTER — Encounter: Payer: Self-pay | Admitting: *Deleted

## 2023-05-24 DIAGNOSIS — Z006 Encounter for examination for normal comparison and control in clinical research program: Secondary | ICD-10-CM

## 2023-05-24 NOTE — Research (Signed)
[name] [name] called states she needs to reschedule her appointment. Re scheduled her for Monday 05-28-23 at 0900.

## 2023-05-26 ENCOUNTER — Other Ambulatory Visit: Payer: Self-pay | Admitting: Internal Medicine

## 2023-05-26 ENCOUNTER — Other Ambulatory Visit: Payer: Self-pay | Admitting: Family Medicine

## 2023-05-26 DIAGNOSIS — I4891 Unspecified atrial fibrillation: Secondary | ICD-10-CM

## 2023-05-28 ENCOUNTER — Encounter: Payer: Medicare Other | Admitting: *Deleted

## 2023-05-28 MED ORDER — STUDY - ESSENCE - OLEZARSEN 50 MG, 80 MG OR PLACEBO SQ INJECTION (PI-HILTY)
80.0000 mg | INJECTION | SUBCUTANEOUS | Status: DC
  Filled 2023-05-28: qty 0.8

## 2023-05-28 NOTE — Research (Addendum)
Spoke with [name] [name] she states she has COVID and will not be able to come in today. Symptoms are chills, cough, and diarrhea. Encouraged her to call when she feels better, so we can reschedule. Voices understanding.

## 2023-05-28 NOTE — Telephone Encounter (Signed)
Eliquis 5mg  refill request received. Patient is 72 years old, weight-92.6kg, Crea-0.70 on 11/20/22, Diagnosis-Afib, and last seen by Eula Listen on 01/03/23. Dose is appropriate based on dosing criteria. Will send in refill to requested pharmacy.

## 2023-05-28 NOTE — Telephone Encounter (Signed)
Please review

## 2023-06-05 ENCOUNTER — Other Ambulatory Visit: Payer: Self-pay

## 2023-06-05 DIAGNOSIS — Z006 Encounter for examination for normal comparison and control in clinical research program: Secondary | ICD-10-CM

## 2023-06-05 MED ORDER — STUDY - ESSENCE - OLEZARSEN 50 MG, 80 MG OR PLACEBO SQ INJECTION (PI-HILTY)
80.0000 mg | INJECTION | SUBCUTANEOUS | Status: DC
  Administered 2023-06-05: 80 mg via SUBCUTANEOUS
  Filled 2023-06-05: qty 0.8

## 2023-06-05 NOTE — Research (Addendum)
TREATMENT DAY 337- STUDY WEEK 49    Subject Number: [id number]              Randomization Number:  [id number]           Date: 24-Sept-2024      [x] Vital Signs Collected - Blood Pressure:119/76 - Heart Rate:84 - Respiratory Rate:20 - Temperature:97.0 - Oxygen Saturation:93%   [x]   (abdominal pain only) since last visit  [x]  Assessment of ER Visits, Hospitalizations, and Inpatient Days  [x]  Adverse Events and Concomitant Medications  [x]  Diet, Lifestyle, and Alcohol Counseling   [x]  Study Drug: [place] Injection   [name] [name] is here for Week 49 essence visit. She reports no abd pain, no changes in her meds, and no visits to the Ed or urgent care. Vs taken at 0945. Injection given in left lower abd at 1019. Tol well kit number [id number]. Scheduled next appointment for Oct 10 at 0930.   Current Outpatient Medications:    acetaminophen (TYLENOL) 650 MG CR tablet, Take 1,300 mg by mouth every 8 (eight) hours as needed for pain., Disp: , Rfl:    alendronate (FOSAMAX) 70 MG tablet, TAKE 1 TABLET (70 MG TOTAL) BY MOUTH EVERY 7 DAYS WITH FULL GLASS WATER ON EMPTY STOMACH, Disp: 12 tablet, Rfl: 3   apixaban (ELIQUIS) 5 MG TABS tablet, TAKE 1 TABLET BY MOUTH TWICE A DAY, Disp: 60 tablet, Rfl: 5   bismuth subsalicylate (PEPTO BISMOL) 262 MG/15ML suspension, Take 30 mLs by mouth every 6 (six) hours as needed for indigestion or diarrhea or loose stools., Disp: , Rfl:    Calcium Carbonate-Vitamin D 600-400 MG-UNIT tablet, Take 1 tablet by mouth daily. , Disp: , Rfl:    carvedilol (COREG) 12.5 MG tablet, Take 1 tablet (12.5 mg total) by mouth 2 (two) times daily with a meal., Disp: 180 tablet, Rfl: 3   cetirizine (ZYRTEC) 10 MG tablet, Take 10 mg by mouth daily as needed for allergies., Disp: , Rfl:    dapagliflozin propanediol (FARXIGA) 10 MG TABS tablet, Take 1 tablet (10 mg total) by mouth daily. Pt needs to keep July appt for further refills., Disp: 30 tablet, Rfl: 0   FLUoxetine (PROZAC) 20 MG  capsule, Take 1 capsule by mouth once daily, Disp: 90 capsule, Rfl: 2   fluticasone (FLONASE) 50 MCG/ACT nasal spray, SPRAY 2 SPRAYS INTO EACH NOSTRIL EVERY DAY, Disp: 48 mL, Rfl: 1   furosemide (LASIX) 40 MG tablet, TAKE 1/2 TABLET BY MOUTH DAILY, Disp: 45 tablet, Rfl: 0   Glycerin-Hypromellose-PEG 400 (DRY EYE RELIEF DROPS) 0.2-0.2-1 % SOLN, Place 1 drop into both eyes daily as needed (Dry eye)., Disp: , Rfl:    Grape Seed Extract 60 MG CAPS, Take 60 mg by mouth 2 (two) times daily., Disp: , Rfl:    Misc Natural Products (OSTEO BI-FLEX ADV JOINT SHIELD) TABS, Take 1 tablet by mouth 2 (two) times daily., Disp: , Rfl:    Multiple Vitamin (MULTIVITAMIN WITH MINERALS) TABS tablet, Take 1 tablet by mouth daily., Disp: , Rfl:    Multiple Vitamins-Minerals (EYE VITAMINS) CAPS, Take 1 capsule by mouth daily. Vision Shield, Disp: , Rfl:    nystatin cream (MYCOSTATIN), Apply topically 2 (two) times daily., Disp: , Rfl:    pseudoephedrine-acetaminophen (TYLENOL SINUS) 30-500 MG TABS tablet, Take 2 tablets by mouth every 4 (four) hours as needed (Sinus)., Disp: , Rfl:    rosuvastatin (CRESTOR) 40 MG tablet, TAKE 1 TABLET BY MOUTH EVERY DAY, Disp: 90 tablet, Rfl:  0   sacubitril-valsartan (ENTRESTO) 97-103 MG, TAKE 1 TABLET BY MOUTH TWICE A DAY, Disp: 60 tablet, Rfl: 3   spironolactone (ALDACTONE) 25 MG tablet, Take 0.5 tablets (12.5 mg total) by mouth daily., Disp: 45 tablet, Rfl: 3   Study - [name] - olezarsen 50 mg, 80 mg or placebo SQ injection (PI-Hilty), Inject 80 mg into the skin once. For Investigational Use Only. Injection subcutaneously in protocol approved injection sites (abdomen, thigh or outer area of upper arm). Every 4 weeks. Please contact [facility] for any questions or concerns regarding this medication., Disp: , Rfl:   Current Facility-Administered Medications:    Study - [name] - olezarsen 50 mg, 80 mg or placebo SQ injection (PI-Hilty), 80 mg, Subcutaneous, Q28 days, , 80 mg at  06/05/23 1019

## 2023-06-17 ENCOUNTER — Other Ambulatory Visit: Payer: Self-pay | Admitting: Internal Medicine

## 2023-06-18 ENCOUNTER — Other Ambulatory Visit: Payer: Self-pay | Admitting: Internal Medicine

## 2023-06-18 DIAGNOSIS — Z79899 Other long term (current) drug therapy: Secondary | ICD-10-CM

## 2023-06-19 DIAGNOSIS — I480 Paroxysmal atrial fibrillation: Secondary | ICD-10-CM | POA: Insufficient documentation

## 2023-06-19 HISTORY — DX: Paroxysmal atrial fibrillation: I48.0

## 2023-06-19 NOTE — Progress Notes (Unsigned)
Cardiology Office Note Date:  06/20/2023  Patient ID:  [name], [name] 24-May-1951, MRN [id number] PCP:  Hannah Beat, MD  Cardiologist:  Yvonne Kendall, MD Electrophysiologist: Lanier Prude, MD     Chief Complaint: 1 year device follow-up  History of Present Illness: [name] [name] is a 72 y.o. female with PMH notable for HFrEF, s/p ICD, CAD s/p PCI, parox AFib, HTN; seen today for Lanier Prude, MD for routine electrophysiology followup of ICD.  She last saw Dr. Lalla Brothers 05/2022, was doing well, exercising walking dogs.  Device interrogation 07/2022 with new onset afib.  She saw PA Dunn 12/2022, was HTN in office but had not taken AM meds.   On follow-up today, she has ongoing, stable SOB. Says that it is at her baseline. She was able to walk from waiting room to exam room without SOB. Is able to grocery shop without symptoms, but has to bring groceries into house slowly and then has to rest prior to putting up groceries. Admittedly, she is not very active throughout the day. She does have a Gazelle exercise machine and uses it 3-4 minutes everyday, sometimes up to 20 minutes if she feels well.  She checks BP a couple times a week, most readings 120s systolic.  Diligently takes eliquis BID, no missed doses, no bleeding concerns.   she denies chest pain, palpitations, orthopnea, nausea, vomiting, dizziness, syncope, edema, weight gain, or early satiety.     Device Information: Biotronik single chamber ICD, imp 03/2021; dx HF, NICM   Past Medical History:  Diagnosis Date   Allergic rhinitis 09/28/2008   Breast cancer (HCC) 2004   Left Breast Cancer   CHF (congestive heart failure) (HCC) 11/28/2019   Coronary artery disease    a. 10/2012 ETT: nl; b. 12/2012 Cath: LM nl, LAD min irregs, D1 min irregs, LCX 30p, RCA 74m, RPDA 40p, EF 50%.   Diastolic dysfunction    a. 12/2012 Echo: EF 50-55%. No rwma. Gr1 DD. Nl RV fxn. Nl PASP.   Dry eyes 03/25/2021   HLD  (hyperlipidemia) 09/28/2008   HTN (hypertension) 06/27/2011   Ischemic cardiomyopathy 12/02/2019   Osteoarthritis 09/28/2008   Osteopenia 11/16/2008    Past Surgical History:  Procedure Laterality Date   ABDOMINAL HYSTERECTOMY  1985   endometriosis; anatomy   BREAST SURGERY  10/2002   CARDIAC CATHETERIZATION  12/19/2012   [facility]; EF 50%   COLONOSCOPY  04/2008   Hyperplastic polyp; repeat in 10 years   CORONARY STENT INTERVENTION N/A 05/03/2020   Procedure: CORONARY STENT INTERVENTION;  Surgeon: Yvonne Kendall, MD;  Location: [facility] INVASIVE CV LAB;  Service: Cardiovascular;  Laterality: N/A;   CORONARY ULTRASOUND/IVUS N/A 05/03/2020   Procedure: Intravascular Ultrasound/IVUS;  Surgeon: Yvonne Kendall, MD;  Location: [facility] INVASIVE CV LAB;  Service: Cardiovascular;  Laterality: N/A;   HIP FRACTURE SURGERY  06/14/2011   ball replaced (Dr. Marciano Sequin)   ICD IMPLANT N/A 03/31/2021   Procedure: ICD IMPLANT;  Surgeon: Lanier Prude, MD;  [facility] IN[facility] CV [facility];  Service: Cardiovascular;  Laterality: N/A;   LEFT HEART CATH N/A 05/03/2020   Procedure: Left Heart Cath;  Surgeon: Yvonne Kendall, MD;  Location: [facility] INVASIVE CV LAB;  Service: Cardiovascular;  Laterality: N/A;   MASTECTOMY Left 2004   MOLE REMOVAL     RIGHT/LEFT HEART CATH AND CORONARY ANGIOGRAPHY N/A 12/02/2019   Procedure: RIGHT/LEFT HEART CATH AND CORONARY ANGIOGRAPHY;  Surgeon: Yvonne Kendall, MD;  Location: [facility] INVASIVE CV LAB;  Service: Cardiovascular;  Laterality: N/A;   TONSILLECTOMY  1958    Current Outpatient Medications  Medication Instructions   acetaminophen (TYLENOL) 1,300 mg, Oral, Every 8 hours PRN   alendronate (FOSAMAX) 70 MG tablet TAKE 1 TABLET (70 MG TOTAL) BY MOUTH EVERY 7 DAYS WITH FULL GLASS WATER ON EMPTY STOMACH   bismuth subsalicylate (PEPTO BISMOL) 262 MG/15ML suspension 30 mLs, Oral, Every 6 hours PRN   Calcium Carbonate-Vitamin D 600-400 MG-UNIT tablet 1 tablet, Oral, Daily   carvedilol  (COREG) 12.5 mg, Oral, 2 times daily with meals   cetirizine (ZYRTEC) 10 mg, Oral, Daily PRN   dapagliflozin propanediol (FARXIGA) 10 mg, Oral, Daily, Pt needs to keep July appt for further refills.   Eliquis 5 mg, Oral, 2 times daily   [name] olezarsen or placebo 80 mg, Subcutaneous,  Once, For Investigational Use Only. Injection subcutaneously in protocol approved injection sites (abdomen, thigh or outer area of upper arm). Every 4 weeks. Please contact [facility] for any questions or concerns regarding this medication.    FLUoxetine (PROZAC) 20 MG capsule Take 1 capsule by mouth once daily   fluticasone (FLONASE) 50 MCG/ACT nasal spray SPRAY 2 SPRAYS INTO EACH NOSTRIL EVERY DAY   furosemide (LASIX) 20 mg, Oral, Daily   Glycerin-Hypromellose-PEG 400 (DRY EYE RELIEF DROPS) 0.2-0.2-1 % SOLN 1 drop, Both Eyes, Daily PRN   Grape Seed Extract 60 mg, Oral, 2 times daily   Misc Natural Products (OSTEO BI-FLEX ADV JOINT SHIELD) TABS 1 tablet, Oral, 2 times daily   Multiple Vitamin (MULTIVITAMIN WITH MINERALS) TABS tablet 1 tablet, Oral, Daily   Multiple Vitamins-Minerals (EYE VITAMINS) CAPS 1 capsule, Oral, Daily, Vision Shield   nystatin cream (MYCOSTATIN) Topical, 2 times daily   pseudoephedrine-acetaminophen (TYLENOL SINUS) 30-500 MG TABS tablet 2 tablets, Oral, Every 4 hours PRN   rosuvastatin (CRESTOR) 40 mg, Oral, Daily   sacubitril-valsartan (ENTRESTO) 97-103 MG 1 tablet, Oral, 2 times daily   spironolactone (ALDACTONE) 12.5 mg, Oral, Daily    Social History:  The patient  reports that she quit smoking about 37 years ago. Her smoking use included cigarettes. She started smoking about 47 years ago. She has a 5 pack-year smoking history. She has been exposed to tobacco smoke. She quit smokeless tobacco use about 37 years ago. She reports that she does not drink alcohol and does not use drugs.   Family History:  The patient's family history includes Breast cancer (age of onset: 27) in  her sister; Cancer in her paternal aunt and sister; Diabetes in an other family member; Heart attack (age of onset: 27) in her father; Heart disease in her father; Ovarian cancer in an other family member; Sarcoidosis in her mother.  ROS:  Please see the history of present illness. All other systems are reviewed and otherwise negative.   PHYSICAL EXAM:  VS:  BP 124/82 (BP Location: Right Arm, Patient Position: Sitting)   Pulse 78   Ht 5\' 4"  (1.626 m)   Wt 199 lb 5.8 oz (90.4 kg)   SpO2 98%   BMI 34.22 kg/m  BMI: Body mass index is 34.22 kg/m.  GEN- The patient is well appearing, alert and oriented x 3 today.   Lungs- Clear to ausculation bilaterally, normal work of breathing.  Heart- Regular rate and rhythm, no murmurs, rubs or gallops Extremities- No peripheral edema, warm, dry Skin-   device pocket well-healed, no tethering   Device interrogation done today and reviewed by myself:  Battery 100 % Lead thresholds, impedence, sensing stable  No recent AFib episodes No changes made today  EKG is ordered. Personal review of EKG from today shows:    EKG Interpretation Date/Time:  Wednesday June 20 2023 09:50:15 EDT Ventricular Rate:  78 PR Interval:  156 QRS Duration:  116 QT Interval:  390 QTC Calculation: 444 R Axis:   -17  Text Interpretation: Normal sinus rhythm Left ventricular hypertrophy with QRS widening Confirmed by Sherie Don [id number]) on 06/20/2023 10:01:31 AM    01/03/2023 - SR with rare PVC; LVH QRS  Recent Labs: 11/20/2022: ALT 32; BUN 11; Creatinine, Ser 0.70; Hemoglobin 16.2; Platelets 307.0; Potassium 4.3; Sodium 141; TSH 2.74  11/20/2022: Cholesterol 110; HDL 57.00; LDL Cholesterol 31; Total CHOL/HDL Ratio 2; Triglycerides 110.0; VLDL 22.0   CrCl cannot be calculated (Patient's most recent lab result is older than the maximum 21 days allowed.).   Wt Readings from Last 3 Encounters:  06/20/23 199 lb 5.8 oz (90.4 kg)  04/09/23 204 lb 2 oz (92.6 kg)   03/08/23 209 lb (94.8 kg)     Additional studies reviewed include: Previous EP, cardiology notes.   TTE, 01/31/2023 1. Left ventricular ejection fraction, by estimation, is 30 to 35%. The left ventricle has moderately decreased function. The left ventricle demonstrates global hypokinesis with severe hypokinesis of the anterior and anteroseptal wall.   2. The left ventricular internal cavity size was moderately to severely dilated. Left ventricular diastolic parameters are consistent with Grade I diastolic dysfunction (impaired relaxation).   3. Right ventricular systolic function is normal. The right ventricular size is normal. Tricuspid regurgitation signal is inadequate for assessing PA pressure.   4. The mitral valve is normal in structure. Mild mitral valve regurgitation. No evidence of mitral stenosis.   5. The aortic valve is tricuspid. Aortic valve regurgitation is not visualized. No aortic stenosis is present.   6. There is borderline dilatation of the ascending aorta, measuring 39 mm.   7. The inferior vena cava is normal in size with greater than 50% respiratory variability, suggesting right atrial pressure of 3 mmHg.   Comparison(s): 01/2021: 30-35% (with Definity), Dil LV.   TTE, 07/16/2020  1. Left ventricular ejection fraction, by estimation, is 25 to 30%. The left ventricle has severely decreased function. The left ventricle demonstrates global hypokinesis with severe hypokinesis of the  anterospetal wall and basal anterior wall. The left   ventricular internal cavity size was mildly dilated.   2. Right ventricular systolic function is normal. The right ventricular size is normal. Tricuspid regurgitation signal is inadequate for assessing PA pressure.   Coronary stent intervention, 05/03/2020 Severe two-vessel coronary artery disease with diffuse LAD disease of up to 70-80% that is significant by IVUS, as well as CTO of RCA. Upper normal LVEDP. Successful IVUS-guided PCI to  mid/distal LAD using overlapping Resolute Onyx 2.5 x 38 mm and 2.25 x 34 mm drug-eluting stents with 0% residual stenosis and TIMI-3 flow.    ASSESSMENT AND PLAN:  #) ICM s/p ICD Device functioning well, see paceart for details No HV therapy  #) HFrEF NYHA II-III symptoms; query whether SOB is d/t deconditioning Warm and dry on exam GDMT: coreg 12.5 BID, farxiga 10, entresto 97-103 BID, spiro 12.5 daily Diuretic: 20mg  lasix daily  #) parox AFib No further afib episodes Continue coreg   #) Hypercoag d/t parox afib CHA2DS2-VASc Score = 5 [CHF History: 1, HTN History: 1, Diabetes History: 0, Stroke History: 0, Vascular Disease History: 1, Age Score: 1, Gender Score: 1].  Therefore, the  patient's annual risk of stroke is 7.2 %.    Stroke ppx - 5mg  eliquis BID, appropriately dosed No bleeding concerns        Current medicines are reviewed at length with the patient today.   The patient does not have concerns regarding her medicines.  The following changes were made today:  none  Labs/ tests ordered today include:  Orders Placed This Encounter  Procedures   EKG 12-Lead     Disposition: Follow up with Dr. Lalla Brothers or EP APP in 12 months   Signed, Sherie Don, NP  06/20/23  3:34 PM  Electrophysiology [facility]

## 2023-06-20 ENCOUNTER — Encounter: Payer: Self-pay | Admitting: Cardiology

## 2023-06-20 ENCOUNTER — Ambulatory Visit: Payer: Medicare Other | Attending: Cardiology | Admitting: Cardiology

## 2023-06-20 VITALS — BP 124/82 | HR 78 | Ht 64.0 in | Wt 199.4 lb

## 2023-06-20 DIAGNOSIS — I10 Essential (primary) hypertension: Secondary | ICD-10-CM | POA: Diagnosis not present

## 2023-06-20 DIAGNOSIS — I480 Paroxysmal atrial fibrillation: Secondary | ICD-10-CM | POA: Insufficient documentation

## 2023-06-20 DIAGNOSIS — I5022 Chronic systolic (congestive) heart failure: Secondary | ICD-10-CM | POA: Insufficient documentation

## 2023-06-20 DIAGNOSIS — Z95810 Presence of automatic (implantable) cardiac defibrillator: Secondary | ICD-10-CM | POA: Diagnosis not present

## 2023-06-20 DIAGNOSIS — I255 Ischemic cardiomyopathy: Secondary | ICD-10-CM | POA: Diagnosis not present

## 2023-06-20 LAB — CUP PACEART INCLINIC DEVICE CHECK
Date Time Interrogation Session: 20241009155526
Implantable Lead Connection Status: 753985
Implantable Lead Implant Date: 20220721
Implantable Lead Location: 753860
Implantable Lead Model: 436909
Implantable Lead Serial Number: 81466093
Implantable Pulse Generator Implant Date: 20220721
Pulse Gen Model: 429525
Pulse Gen Serial Number: 84852229

## 2023-06-20 NOTE — Patient Instructions (Signed)
Medication Instructions:  The current medical regimen is effective;  continue present plan and medications.  *If you need a refill on your cardiac medications before your next appointment, please call your pharmacy*   Follow-Up: At [facility], you and your health needs are our priority.  As part of our continuing mission to provide you with exceptional heart care, we have created designated Provider Care Teams.  These Care Teams include your primary Cardiologist (physician) and Advanced Practice Providers (APPs -  Physician Assistants and Nurse Practitioners) who all work together to provide you with the care you need, when you need it.  We recommend signing up for the patient portal called "MyChart".  Sign up information is provided on this After Visit Summary.  MyChart is used to connect with patients for Virtual Visits (Telemedicine).  Patients are able to view lab/test results, encounter notes, upcoming appointments, etc.  Non-urgent messages can be sent to your provider as well.   To learn more about what you can do with MyChart, go to [email or web address].    Your next appointment:   12 month(s)  Provider:   Steffanie Dunn, MD or Sherie Don, NP

## 2023-06-21 ENCOUNTER — Telehealth: Payer: Self-pay | Admitting: Physician Assistant

## 2023-06-21 ENCOUNTER — Ambulatory Visit
Admission: RE | Admit: 2023-06-21 | Discharge: 2023-06-21 | Disposition: A | Discharge status: Home / Self Care | Payer: Medicare Other | Source: Ambulatory Visit | Attending: Physician Assistant | Admitting: Physician Assistant

## 2023-06-21 ENCOUNTER — Encounter: Payer: Medicare Other | Admitting: *Deleted

## 2023-06-21 VITALS — BP 121/82 | HR 78 | Temp 97.5°F | Resp 16 | Wt 199.6 lb

## 2023-06-21 DIAGNOSIS — R0989 Other specified symptoms and signs involving the circulatory and respiratory systems: Secondary | ICD-10-CM | POA: Insufficient documentation

## 2023-06-21 DIAGNOSIS — Z955 Presence of coronary angioplasty implant and graft: Secondary | ICD-10-CM | POA: Diagnosis not present

## 2023-06-21 DIAGNOSIS — Z006 Encounter for examination for normal comparison and control in clinical research program: Secondary | ICD-10-CM

## 2023-06-21 NOTE — Progress Notes (Addendum)
This nice lady is doing well.  She has a dry cough for a while which she says is common for her due to allergies.  She did have COVID recently, but is well otherwise.  No chest pain.  She has tolerated IP well and has now been off for awhile.  She has completed the study drug period, and will be seen back one more time.  Denies any chest pain or ongoing issues.  Please see prior notes.    BP 121/82 (Patient Position: Sitting)   Pulse 78   Temp 97.5 F (36.4 C) Important    Resp 16   Wt 199 lb 9.6 oz (90.5 kg)   SpO2 95%   Alert, cheerful female in NAD No JVD  Device site looks good.   No carotid bruits Cor reg Chest -  LLL crackles which do not clear with cough (Reported clear yesterday) Abd soft without hepatosplenomegaly.   Ext   no edema  ECG  -  NSR.  LVH.  Cannot exclude lateral infarct.  Compared to 06/22/22, loss of R in V5 and T wave inversion laterally, new from prior tracing.  Change in R wave could be lead position, and T wave changes nonspecific in asymptomatic patient. Patient has ICD, and prior stenting of LAD with L R collaterals.      Week 53 [name] doing well off investigational product Left basilar crackles without symptoms SP ICD with normal device function HFrEF Parox AF  Plan  Continue follow up with [name] Eula Listen PAC/Spencer Copeland MD contacted by secure chat suggesting CXR at follow up (Sees RD 10/30).  He will order CXR   Maisie Fus D. Riley Kill, MD, [facility] Medical Director, [facility]

## 2023-06-21 NOTE — Telephone Encounter (Signed)
The patient has been notified of Ryan's intent to have a chest x-ray performed. Pt verbalized understanding and appreciated urgency of the test.  Pt encouraged to call if any concerns or questions arise  Order for urgent DG chest placed

## 2023-06-21 NOTE — Addendum Note (Signed)
Addended by: Ursula Alert on: 06/21/2023 11:47 AM   Modules accepted: Orders

## 2023-06-21 NOTE — Research (Signed)
[name] week 53  Subject # [id number] Physical exam: [x]  TS Meds reviewed: [x]  EKG [x]  VS [x]  Blood work: 1610 Urine: 1003  No ae or urgent care visits.  No abdominal pain No med changes   Current Outpatient Medications:    acetaminophen (TYLENOL) 650 MG CR tablet, Take 1,300 mg by mouth every 8 (eight) hours as needed for pain., Disp: , Rfl:    alendronate (FOSAMAX) 70 MG tablet, TAKE 1 TABLET (70 MG TOTAL) BY MOUTH EVERY 7 DAYS WITH FULL GLASS WATER ON EMPTY STOMACH, Disp: 12 tablet, Rfl: 3   apixaban (ELIQUIS) 5 MG TABS tablet, TAKE 1 TABLET BY MOUTH TWICE A DAY, Disp: 60 tablet, Rfl: 5   bismuth subsalicylate (PEPTO BISMOL) 262 MG/15ML suspension, Take 30 mLs by mouth every 6 (six) hours as needed for indigestion or diarrhea or loose stools., Disp: , Rfl:    Calcium Carbonate-Vitamin D 600-400 MG-UNIT tablet, Take 1 tablet by mouth daily. , Disp: , Rfl:    carvedilol (COREG) 12.5 MG tablet, Take 1 tablet (12.5 mg total) by mouth 2 (two) times daily with a meal., Disp: 180 tablet, Rfl: 3   cetirizine (ZYRTEC) 10 MG tablet, Take 10 mg by mouth daily as needed for allergies., Disp: , Rfl:    dapagliflozin propanediol (FARXIGA) 10 MG TABS tablet, Take 1 tablet (10 mg total) by mouth daily. Pt needs to keep July appt for further refills., Disp: 30 tablet, Rfl: 0   FLUoxetine (PROZAC) 20 MG capsule, Take 1 capsule by mouth once daily, Disp: 90 capsule, Rfl: 2   fluticasone (FLONASE) 50 MCG/ACT nasal spray, SPRAY 2 SPRAYS INTO EACH NOSTRIL EVERY DAY (Patient taking differently: 1 spray daily as needed for allergies.), Disp: 48 mL, Rfl: 1   furosemide (LASIX) 40 MG tablet, TAKE 1/2 TABLET BY MOUTH DAILY, Disp: 45 tablet, Rfl: 0   Glycerin-Hypromellose-PEG 400 (DRY EYE RELIEF DROPS) 0.2-0.2-1 % SOLN, Place 1 drop into both eyes daily as needed (Dry eye)., Disp: , Rfl:    Grape Seed Extract 60 MG CAPS, Take 60 mg by mouth daily at 2 PM., Disp: , Rfl:    Misc Natural Products (OSTEO BI-FLEX ADV JOINT  SHIELD) TABS, Take 1 tablet by mouth 2 (two) times daily., Disp: , Rfl:    Multiple Vitamin (MULTIVITAMIN WITH MINERALS) TABS tablet, Take 1 tablet by mouth daily., Disp: , Rfl:    Multiple Vitamins-Minerals (EYE VITAMINS) CAPS, Take 1 capsule by mouth daily. Vision Shield, Disp: , Rfl:    nystatin cream (MYCOSTATIN), Apply topically as needed., Disp: , Rfl:    pseudoephedrine-acetaminophen (TYLENOL SINUS) 30-500 MG TABS tablet, Take 2 tablets by mouth every 4 (four) hours as needed (Sinus)., Disp: , Rfl:    rosuvastatin (CRESTOR) 40 MG tablet, TAKE 1 TABLET BY MOUTH EVERY DAY, Disp: 90 tablet, Rfl: 0   sacubitril-valsartan (ENTRESTO) 97-103 MG, TAKE 1 TABLET BY MOUTH TWICE A DAY, Disp: 60 tablet, Rfl: 0   spironolactone (ALDACTONE) 25 MG tablet, Take 0.5 tablets (12.5 mg total) by mouth daily., Disp: 45 tablet, Rfl: 3   Study - [name] - olezarsen 50 mg, 80 mg or placebo SQ injection (PI-Hilty), Inject 80 mg into the skin once. For Investigational Use Only. Injection subcutaneously in protocol approved injection sites (abdomen, thigh or outer area of upper arm). Every 4 weeks. Please contact [facility] for any questions or concerns regarding this medication., Disp: , Rfl:   Current Facility-Administered Medications:    Study - [name] - olezarsen 50 mg, 80  mg or placebo SQ injection (PI-Hilty), 80 mg, Subcutaneous, Q28 days, , 80 mg at 06/05/23 1019\

## 2023-06-21 NOTE — Telephone Encounter (Signed)
Received message from the research department with patient having left lower lobe crackles on exam.  Please order chest x-ray.

## 2023-06-21 NOTE — Telephone Encounter (Signed)
Pt called back and informed that [facility] [place] [facility]

## 2023-06-25 ENCOUNTER — Telehealth: Payer: Self-pay | Admitting: *Deleted

## 2023-06-25 NOTE — Telephone Encounter (Signed)
Pre-operative Risk Assessment    Patient Name: [name] [name]  DOB: [birth year] MRN: [id number]  DATE OF LAST VISIT: 06/20/23 Sherie Don, NP DATE OF NEXT VISIT: 07/11/23 Eula Listen, [facility]    Request for Surgical Clearance    Procedure:   COLONOSCOPY  Date of Surgery:  Clearance TBD                                 Surgeon:  DR. Dulce Sellar Surgeon's Group or Practice Name:  [facility] Phone number:  [phone] Fax number:  [phone]   Type of Clearance Requested:   - Medical  - Pharmacy:  Hold Apixaban (Eliquis)     Type of Anesthesia:   PROPOFOL   Additional requests/questions:    Elpidio Anis   06/25/2023, 4:42 PM

## 2023-06-25 NOTE — Research (Addendum)
[name] [name] Week 53 Day 365 [name] 21-Jun-2023   Coagulation: Prothrombin Time 15.6               [] Clinically Significant  [x] Not Clinically Significant      Any further action needed to be taken per the PI?  No  Chrystie Nose, MD, [facility], FACP  [place]  [facility]  Medical Director of the Advanced Lipid Disorders &  Cardiovascular Risk Reduction Clinic Diplomate of the American Board of Clinical Lipidology Attending Cardiologist  Direct Dial: [phone]  Fax: [phone]  Website:  [email or web address]              Chemistry: Hs-C-Reactive Protein 4.8 mg/L         [] Clinically Significant  [x] Not Clinically Significant  Insulin 31.4                                          [] Clinically Significant  [x] Not Clinically Significant   Hematology: White Blood Cells 11.8                         [] Clinically Significant  [x] Not Clinically Significant Red Blood Cells 5.58                           [] Clinically Significant  [x] Not Clinically Significant Hemoglobin 16.3 g/dL                          [] Clinically Significant  [x] Not Clinically Significant Hematocrit 50 %                                  [] Clinically Significant  [x] Not Clinically Significant   RDW 15.7 %                                        [] Clinically Significant  [x] Not Clinically Significant Lymphocyte % 14.4 %                         [] Clinically Significant  [x] Not Clinically Significant Neutrophil (Absolute) 8.94                   [] Clinically Significant  [x] Not Clinically Significant   Urinalysis: Glucose >1000 mg/dL                           [] Clinically Significant  [x] Not Clinically Significant Specific Gravity 1.048                           [] Clinically Significant  [x] Not Clinically Significant Protein 70 mg/dL                                   [] Clinically Significant  [x] Not Clinically Significant Leukocyte Esterase 75                          [] Clinically Significant   [x] Not Clinically Significant      Any further action  needed to be taken per the PI? No  Chrystie Nose, MD, [facility], FACP  [place]  [facility]  Medical Director of the Advanced Lipid Disorders &  Cardiovascular Risk Reduction Clinic Diplomate of the American Board of Clinical Lipidology Attending Cardiologist  Direct Dial: [phone]  Fax: [phone]  Website:  [email or web address]

## 2023-06-26 NOTE — Telephone Encounter (Signed)
Patient with diagnosis of afib on Eliquis for anticoagulation.    Procedure: colonoscopy Date of procedure: TBD   CHA2DS2-VASc Score = 5   This indicates a 7.2% annual risk of stroke. The patient's score is based upon: CHF History: 1 HTN History: 1 Diabetes History: 0 Stroke History: 0 Vascular Disease History: 1 Age Score: 1 Gender Score: 1      CrCl 80 ml/min Platelet count 307  Per office protocol, patient can hold Eliquis for 2 days prior to procedure.    **This guidance is not considered finalized until pre-operative APP has relayed final recommendations.**

## 2023-06-26 NOTE — Telephone Encounter (Signed)
Hi [name] [name],  You saw this pt on 06/20/23 for EP follow-up.  Would you be able to address fitness for upcoming colonoscopy or would you prefer she wait until appointment on 10/30 with Ryan done at which time clearance can specifically be addressed?  Please send your response to p cv div preop  Thank you, Marcelino Duster

## 2023-06-26 NOTE — Telephone Encounter (Signed)
Will update the requesting all parties involved pt has appt 07/11/23 with [name] [name], PAC.

## 2023-06-26 NOTE — Telephone Encounter (Signed)
Primary Cardiologist:[name] End, MD  Chart reviewed as part of pre-operative protocol coverage. Because of [name] [name]'s past medical history and time since last visit, he/she will require a follow-up visit in order to better assess preoperative cardiovascular risk.  Pre-op covering staff: -Patient has upcoming appointment with [name] Listen, PA on 07/11/2023 at which time clearance can be addressed.  Appointment notes have been updated. - Please contact requesting surgeon's office via preferred method (i.e, phone, fax) to inform them of need for appointment prior to surgery.  Per office protocol, patient can hold Eliquis for 2 days prior to procedure.   [name] Aland, NP-C  06/26/2023, 4:06 PM [address], [address] Office [phone] Fax [phone]

## 2023-06-28 ENCOUNTER — Encounter: Payer: Self-pay | Admitting: Internal Medicine

## 2023-06-28 ENCOUNTER — Ambulatory Visit (INDEPENDENT_AMBULATORY_CARE_PROVIDER_SITE_OTHER): Payer: Medicare Other

## 2023-06-28 DIAGNOSIS — I255 Ischemic cardiomyopathy: Secondary | ICD-10-CM

## 2023-06-28 DIAGNOSIS — I5022 Chronic systolic (congestive) heart failure: Secondary | ICD-10-CM

## 2023-06-28 LAB — CUP PACEART REMOTE DEVICE CHECK
Brady Statistic RV Percent Paced: 0 %
Date Time Interrogation Session: 20241017073239
HighPow Impedance: 83 Ohm
Implantable Lead Connection Status: 753985
Implantable Lead Implant Date: 20220721
Implantable Lead Location: 753860
Implantable Lead Model: 436909
Implantable Lead Serial Number: 81466093
Implantable Pulse Generator Implant Date: 20220721
Lead Channel Impedance Value: 913 Ohm
Lead Channel Pacing Threshold Amplitude: 0.4 V
Lead Channel Pacing Threshold Pulse Width: 0.4 ms
Lead Channel Sensing Intrinsic Amplitude: 19.5 mV
Lead Channel Sensing Intrinsic Amplitude: 5.7 mV
Lead Channel Setting Pacing Amplitude: 3 V
Lead Channel Setting Pacing Pulse Width: 0.4 ms
Lead Channel Setting Sensing Sensitivity: 0.8 mV
Pulse Gen Model: 429525
Pulse Gen Serial Number: 84852229
Zone Setting Status: 755011

## 2023-07-10 NOTE — Progress Notes (Unsigned)
Cardiology Office Note    Date:  07/11/2023   ID:  [name] [name], DOB Oct 12, 1950, MRN [id number]  PCP:  Hannah Beat, MD  Cardiologist:  Yvonne Kendall, MD  Electrophysiologist:  Lanier Prude, MD   Chief Complaint: Follow up  History of Present Illness:   [name] [name] is a 72 y.o. 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   Prior LHC in 12/2012 showed nonobstructive disease as outlined below.  Echo at that time showed an EF of 50 to 55%, no regional wall motion abnormalities, grade 1 diastolic dysfunction, normal RV systolic function and PASP.  She was admitted to the hospital in 11/2019 with exertional dyspnea.  High-sensitivity troponin peaked at 415.  Echo demonstrated a new cardiomyopathy with an EF of 25 to 30%, global hypokinesis, mildly dilated LV internal cavity size, mild LVH, grade 1 diastolic dysfunction, normal RV systolic function and ventricular cavity size, mildly dilated left atrium, and an estimated right atrial pressure of 3 mmHg.  R/LHC at that time showed significant two-vessel CAD with diffuse LAD stenosis up to 70 to 80% in the mid/distal vessel and CTO of the mid RCA with left-to-right collaterals.  Moderately elevated left heart, right heart, and pulmonary artery pressures.  Low normal cardiac output/index.  Medical management was advised given small vessel size and diffuse disease involving the LAD and RCA making PCI and surgical revascularization suboptimal.  Repeat echo on GDMT in 02/2020 showed a persistent cardiomyopathy with an EF of 25 to 30%, global hypokinesis, mildly dilated LV internal cavity size, grade 2 diastolic dysfunction, normal RV systolic function and ventricular  cavity size, mildly dilated left atrium, mild mitral regurgitation, and mild to moderate aortic valve sclerosis without evidence of stenosis.  She subsequently underwent repeat LHC in 04/2020 which showed severe two-vessel CAD with diffuse LAD stenosis of up to 70 to 80% that was significant by IVUS, as well as CTO of the RCA.  She underwent successful IVUS guided PCI to the mid/distal LAD using overlapping DES.     She was admitted to the hospital in 07/2020 with acute on chronic combined systolic and diastolic CHF and a large left pleural effusion.  She underwent thoracentesis with 1.4 L of fluid removed with cytology being negative for malignancy.  Echo in 07/2020, again showed a persistent cardiomyopathy with an EF of 25 to 30%, global hypokinesis with severe hypokinesis of the anteroseptal wall and basal anterior wall, mildly dilated LV internal cavity size, normal RV systolic function and ventricular cavity size.   She underwent repeat echo on 02/01/2021, on maximally tolerated GDMT, which showed a slight improvement in her LVSF with an EF of 30 to 35%, global hypokinesis, moderately to severely dilated LV internal cavity size, grade 1 diastolic dysfunction, trivial pericardial effusion, and akinesis of the basal mid inferior wall.  She was evaluated by EP on 03/23/2021 with recommendation to proceed with ICD for primary prevention.  She subsequently underwent implantation of Biotronik ICD on 03/31/2021.   Device interrogation in 07/2022 showed new onset A-fib.  She was subsequently evaluated by the A-fib clinic and initiated on apixaban with discontinuation of clopidogrel to minimize bleeding risk.  She was last seen in the office on 01/03/2023 and was without symptoms of angina or  cardiac decompensation.  To reevaluate her LV systolic function and she underwent echo in 01/2023 that showed an EF of 30 to 35%, global hypokinesis with severe hypokinesis of the anterior and anteroseptal wall, moderately to  severely dilated LV internal cavity size, grade 1 diastolic dysfunction, normal RV systolic function and ventricular cavity size, mild mitral regurgitation, and borderline dilatation of the ascending aorta measuring 39 mm.  She subsequently underwent chest x-ray on 06/21/2023, at the recommendation of our research team, for abnormal breath sounds noted on exam.  Chest x-ray showed no acute findings.  She comes in doing well from a cardiac perspective and is without symptoms of angina or cardiac decompensation.  No dyspnea, palpitations, presyncope, or syncope.  No lower extremity swelling or progressive orthopnea.  Since she was last seen she did have a mechanical fall, stubbing her toe coming inside the house.  She did not hit her head or suffer LOC.  No hematochezia or melena.  Remains active at baseline and exercises on her [facility] without cardiac limitation.  She is planning to undergo colonoscopy.  [facility] Activity Status Index: > 4 METs Revised Cardiac Risk Index: Class II risk with an estimated rate of 6% for adverse cardiac event in the perioperative timeframe   Labs independently reviewed: 11/2022 - TSH normal, TC 110, TG 110, HDL 57, LDL 31, A1c 5.9, albumin 4.3, AST/ALT normal, Hgb 16.2, PLT 307, potassium 4.3, BUN 11, serum creatinine 0.7   Past Medical History:  Diagnosis Date   Allergic rhinitis 09/28/2008   Breast cancer (HCC) 2004   Left Breast Cancer   CHF (congestive heart failure) (HCC) 11/28/2019   Coronary artery disease    a. 10/2012 ETT: nl; b. 12/2012 Cath: LM nl, LAD min irregs, D1 min irregs, LCX 30p, RCA 78m, RPDA 40p, EF 50%.   Diastolic dysfunction    a. 12/2012 Echo: EF 50-55%. No rwma. Gr1 DD. Nl RV fxn. Nl PASP.   Dry eyes 03/25/2021   HLD (hyperlipidemia) 09/28/2008   HTN (hypertension) 06/27/2011   Ischemic cardiomyopathy 12/02/2019   Osteoarthritis 09/28/2008   Osteopenia 11/16/2008    Past Surgical History:  Procedure Laterality Date   ABDOMINAL  HYSTERECTOMY  1985   endometriosis; anatomy   BREAST SURGERY  10/2002   CARDIAC CATHETERIZATION  12/19/2012   [facility]; EF 50%   COLONOSCOPY  04/2008   Hyperplastic polyp; repeat in 10 years   CORONARY STENT INTERVENTION N/A 05/03/2020   Procedure: CORONARY STENT INTERVENTION;  Surgeon: Yvonne Kendall, MD;  Location: [facility] INVASIVE CV LAB;  Service: Cardiovascular;  Laterality: N/A;   CORONARY ULTRASOUND/IVUS N/A 05/03/2020   Procedure: Intravascular Ultrasound/IVUS;  Surgeon: Yvonne Kendall, MD;  Location: [facility] INVASIVE CV LAB;  Service: Cardiovascular;  Laterality: N/A;   HIP FRACTURE SURGERY  06/14/2011   ball replaced (Dr. Marciano Sequin)   ICD IMPLANT N/A 03/31/2021   Procedure: ICD IMPLANT;  Surgeon: Lanier Prude, MD;  Location: [facility] INVASIVE CV LAB;  Service: Cardiovascular;  Laterality: N/A;   LEFT HEART CATH N/A 05/03/2020   Procedure: Left Heart Cath;  Surgeon: Yvonne Kendall, MD;  Location: [facility] INVASIVE CV LAB;  Service: Cardiovascular;  Laterality: N/A;   MASTECTOMY Left 2004   MOLE REMOVAL     RIGHT/LEFT HEART CATH AND CORONARY ANGIOGRAPHY N/A 12/02/2019   Procedure: RIGHT/LEFT HEART CATH AND CORONARY ANGIOGRAPHY;  Surgeon: Yvonne Kendall, MD;  Location: [facility] INVASIVE CV LAB;  Service: Cardiovascular;  Laterality: N/A;   TONSILLECTOMY  1958    Current Medications: Current Meds  Medication Sig   acetaminophen (TYLENOL) 650 MG CR tablet Take 1,300 mg by mouth every 8 (eight) hours as needed for pain.   alendronate (FOSAMAX) 70 MG tablet TAKE 1 TABLET (70 MG TOTAL) BY MOUTH EVERY 7 DAYS WITH FULL GLASS WATER ON EMPTY STOMACH   apixaban (ELIQUIS) 5 MG TABS tablet TAKE 1 TABLET BY MOUTH TWICE A DAY   bismuth subsalicylate (PEPTO BISMOL) 262 MG/15ML suspension Take 30 mLs by mouth every 6 (six) hours as needed for indigestion or diarrhea or loose stools.   Calcium Carbonate-Vitamin D 600-400 MG-UNIT tablet Take 1 tablet by mouth daily.    cetirizine (ZYRTEC) 10 MG tablet Take 10 mg  by mouth daily as needed for allergies.   dapagliflozin propanediol (FARXIGA) 10 MG TABS tablet Take 1 tablet (10 mg total) by mouth daily. Pt needs to keep July appt for further refills.   FLUoxetine (PROZAC) 20 MG capsule Take 1 capsule by mouth once daily   fluticasone (FLONASE) 50 MCG/ACT nasal spray SPRAY 2 SPRAYS INTO EACH NOSTRIL EVERY DAY (Patient taking differently: 1 spray daily as needed for allergies.)   furosemide (LASIX) 40 MG tablet TAKE 1/2 TABLET BY MOUTH DAILY   Glycerin-Hypromellose-PEG 400 (DRY EYE RELIEF DROPS) 0.2-0.2-1 % SOLN Place 1 drop into both eyes daily as needed (Dry eye).   Grape Seed Extract 60 MG CAPS Take 60 mg by mouth daily at 2 PM.   Misc Natural Products (OSTEO BI-FLEX ADV JOINT SHIELD) TABS Take 1 tablet by mouth 2 (two) times daily.   Multiple Vitamin (MULTIVITAMIN WITH MINERALS) TABS tablet Take 1 tablet by mouth daily.   Multiple Vitamins-Minerals (EYE VITAMINS) CAPS Take 1 capsule by mouth daily. Vision Shield   nystatin cream (MYCOSTATIN) Apply topically as needed.   pseudoephedrine-acetaminophen (TYLENOL SINUS) 30-500 MG TABS tablet Take 2 tablets by mouth every 4 (four) hours as needed (Sinus).   rosuvastatin (CRESTOR) 40 MG tablet TAKE 1 TABLET BY MOUTH EVERY DAY   sacubitril-valsartan (ENTRESTO) 97-103 MG TAKE 1 TABLET BY MOUTH TWICE A DAY   spironolactone (ALDACTONE) 25 MG tablet Take 0.5 tablets (12.5 mg total) by mouth daily.   Study - [name] - olezarsen 50 mg, 80 mg or placebo SQ injection (PI-Hilty) Inject 80 mg into the skin once. For Investigational Use Only. Injection subcutaneously in protocol approved injection sites (abdomen, thigh or outer area of upper arm). Every 4 weeks. Please contact [facility] for any questions or concerns regarding this medication.   [DISCONTINUED] carvedilol (COREG) 12.5 MG tablet Take 1 tablet (12.5 mg total) by mouth 2 (two) times daily with a meal.   Current Facility-Administered Medications for the  07/11/23 encounter (Office Visit) with Sondra Barges, PA-C  Medication   Study - [name] - olezarsen 50 mg, 80 mg or placebo SQ injection (PI-Hilty)    Allergies:   Codeine, Elemental sulfur, Morphine, Ace inhibitors, and Sulfonamide derivatives   Social History   Socioeconomic History   Marital status: Widowed    Spouse name: (Widow) to Brett Canales   Number of children: Not on file   Years of education: Not on file   Highest education level: Not on file  Occupational History   Not on file  Tobacco Use   Smoking status: Former    Current packs/day: 0.00    Average packs/day: 0.5 packs/day for 10.0 years (5.0 ttl pk-yrs)    Types: Cigarettes    Start date: 32    Quit date: 32    Years since quitting:  37.8    Passive exposure: Past   Smokeless tobacco: Former    Quit date: 01/24/1986   Tobacco comments:    1989 quit   Vaping Use   Vaping status: Never Used  Substance and Sexual Activity   Alcohol use: No   Drug use: No   Sexual activity: Not on file  Other Topics Concern   Not on file  Social History Narrative   Widowed to husband Silvestre Gunner      No regular exercise   Lives locally w/ tenants that help her w/ chores around the house.   Social Determinants of Health   Financial Resource Strain: Low Risk  (11/10/2021)   Overall Financial Resource Strain (CARDIA)    Difficulty of Paying Living Expenses: Not hard at all  Food Insecurity: No Food Insecurity (11/10/2021)   Hunger Vital Sign    Worried About Running Out of Food in the Last Year: Never true    Ran Out of Food in the Last Year: Never true  Transportation Needs: No Transportation Needs (11/10/2021)   PRAPARE - Administrator, Civil Service (Medical): No    Lack of Transportation (Non-Medical): No  Physical Activity: Sufficiently Active (11/10/2021)   Exercise Vital Sign    Days of Exercise per Week: 7 days    Minutes of Exercise per Session: 30 min  Stress: No Stress Concern Present (11/10/2021)    [employer] of Occupational Health - Occupational Stress Questionnaire    Feeling of Stress : Not at all  Social Connections: Moderately Integrated (11/10/2021)   Social Connection and Isolation Panel [NHANES]    Frequency of Communication with Friends and Family: Three times a week    Frequency of Social Gatherings with Friends and Family: More than three times a week    Attends Religious Services: More than 4 times per year    Active Member of Clubs or Organizations: No    Attends Engineer, structural: More than 4 times per year    Marital Status: Widowed     Family History:  The patient's family history includes Breast cancer (age of onset: 14) in her sister; Cancer in her paternal aunt and sister; Diabetes in an other family member; Heart attack (age of onset: 67) in her father; Heart disease in her father; Ovarian cancer in an other family member; Sarcoidosis in her mother.  ROS:   12-point review of systems is negative unless otherwise noted in the HPI.   EKGs/Labs/Other Studies Reviewed:    Studies reviewed were summarized above. The additional studies were reviewed today:  2D echo 01/31/2023: 1. Left ventricular ejection fraction, by estimation, is 30 to 35%. The  left ventricle has moderately decreased function. The left ventricle  demonstrates global hypokinesis with severe hypokinesis of the anterior  and anteroseptal wall.   2. The left ventricular internal cavity size was moderately to severely  dilated. Left ventricular diastolic parameters are consistent with Grade I  diastolic dysfunction (impaired relaxation).   3. Right ventricular systolic function is normal. The right ventricular  size is normal. Tricuspid regurgitation signal is inadequate for assessing  PA pressure.   4. The mitral valve is normal in structure. Mild mitral valve  regurgitation. No evidence of mitral stenosis.   5. The aortic valve is tricuspid. Aortic valve regurgitation is not   visualized. No aortic stenosis is present.   6. There is borderline dilatation of the ascending aorta, measuring 39  mm.  7. The inferior vena cava is normal in size with greater than 50%  respiratory variability, suggesting right atrial pressure of 3 mmHg.   Comparison(s): 01/2021: 30-35% (with Definity), Dil LV.  __________  2D echo 02/01/2021: 1. Left ventricular ejection fraction, by estimation, is 30 to 35%. The  left ventricle has moderately decreased function. The left ventricle  demonstrates global hypokinesis. The left ventricular internal cavity size  was moderately to severely dilated.  Left ventricular diastolic parameters are consistent with Grade I  diastolic dysfunction (impaired relaxation). There is akinesis of the left  ventricular, basal-mid inferior wall.  __________   2D echo 07/16/2020: 1. Left ventricular ejection fraction, by estimation, is 25 to 30%. The  left ventricle has severely decreased function. The left ventricle  demonstrates global hypokinesis with severe hypokinesis of the  anterospetal wall and basal anterior wall. The left   ventricular internal cavity size was mildly dilated.   2. Right ventricular systolic function is normal. The right ventricular  size is normal. Tricuspid regurgitation signal is inadequate for assessing  PA pressure.  __________   LHC 05/03/2020: Conclusions: Severe two-vessel coronary artery disease with diffuse LAD disease of up to 70-80% that is significant by IVUS, as well as CTO of RCA. Upper normal LVEDP. Successful IVUS-guided PCI to mid/distal LAD using overlapping Resolute Onyx 2.5 x 38 mm and 2.25 x 34 mm drug-eluting stents with 0% residual stenosis and TIMI-3 flow.   Recommendations: Overnight extended recovery given multivessel CAD and LVEF < 35%. Obtain CXR and UA/urine culture for further evaluation of leukocytosis. Dual antiplatelet therapy with ASA and clopidogrel for at lest 6 months, ideally  longer. Recheck lipid panel in AM and escalate statin therapy, as tolerated, if LDL > 70. __________   Cardiac MRI 04/17/2020: 1. Severe left ventricular enlargement with severely reduced LV function, LVEF 33%. Akinesis of inferior and inferolateral walls. 2. Post-contrast delayed myocardial enhancement in the inferior and inferolateral walls, with greater than 50% of the myocardial thickness involved suggesting no viability in the RCA territory. 3.  Normal right ventricular size and systolic function. __________   2D echo 03/01/2020: 1. Left ventricular ejection fraction, by estimation, is 25 to 30%. The  left ventricle has severely decreased function. The left ventricle  demonstrates global hypokinesis. The left ventricular internal cavity size  was mildly dilated. Left ventricular  diastolic parameters are consistent with Grade II diastolic dysfunction  (pseudonormalization).   2. Right ventricular systolic function is normal. The right ventricular  size is normal. Tricuspid regurgitation signal is inadequate for assessing  PA pressure.   3. Left atrial size was mildly dilated.   4. The mitral valve is normal in structure. Mild mitral valve  regurgitation. No evidence of mitral stenosis.   5. The aortic valve is normal in structure. Aortic valve regurgitation is  not visualized. Mild to moderate aortic valve sclerosis/calcification is  present, without any evidence of aortic stenosis.   Comparison(s): Previous Echo showed new finding of LV EF 25-30% with  severely decreased function, global HK, and grade I diastolic dysfunction.  __________   [facility] 12/02/2019: Conclusions: Significant two-vessel coronary artery disease with diffuse LAD disease of up to 70-80% in the mid/distal vessel and chronic subtotal occlusion of the mid RCA with left-to-right collaterals. Moderately elevated left heart, right heart, and pulmonary artery pressures. Low normal Fick cardiac output/index.    Recommendations: Escalate diuresis and optimize evidence-based heart failure therapy. Medical management of coronary artery disease.  Small vessel size and diffuse  disease involving LAD and RCA make percutaneous and surgical revascularization suboptimal. __________   2D echo 11/29/2019: 1. Left ventricular ejection fraction, by estimation, is 25 to 30%. The  left ventricle has severely decreased function. The left ventricle  demonstrates global hypokinesis. The left ventricular internal cavity size  was mildly dilated. There is mild left  ventricular hypertrophy. Left ventricular diastolic parameters are  consistent with Grade I diastolic dysfunction (impaired relaxation).   2. Right ventricular systolic function is normal. The right ventricular  size is normal. Tricuspid regurgitation signal is inadequate for assessing  PA pressure.   3. Left atrial size was mildly dilated.   4. The inferior vena cava is normal in size with greater than 50%  respiratory variability, suggesting right atrial pressure of 3 mmHg. __________   [facility] 12/19/2012: Left main normal, LAD minor luminal irregularities, proximal LCx 30% stenosis, mid RCA 50% stenosis, proximal RPDA 40% stenosis, LVEF estimated 50%.  Medical therapy recommended. __________   2D echo 12/10/2012: - Left ventricle: The cavity size was normal. Systolic    function was normal. The estimated ejection fraction was    in the range of 50% to 55%. Wall motion was normal; there    were no regional wall motion abnormalities. Doppler    parameters are consistent with abnormal left ventricular    relaxation (grade 1 diastolic dysfunction).  - Left atrium: The atrium was normal in size.  - Right ventricle: Systolic function was normal.  - Pulmonary arteries: Systolic pressure was within the    normal range.    EKG:  EKG is ordered today.  The EKG ordered today demonstrates NSR, 84 bpm, LVH, poor wave progression along the precordial leads, no  acute ST-T changes  Recent Labs: 11/20/2022: ALT 32; BUN 11; Creatinine, Ser 0.70; Hemoglobin 16.2; Platelets 307.0; Potassium 4.3; Sodium 141; TSH 2.74  Recent Lipid Panel    Component Value Date/Time   CHOL 110 11/20/2022 0935   CHOL 154 12/22/2021 0900   TRIG 110.0 11/20/2022 0935   HDL 57.00 11/20/2022 0935   HDL 36 (L) 12/22/2021 0900   CHOLHDL 2 11/20/2022 0935   VLDL 22.0 11/20/2022 0935   LDLCALC 31 11/20/2022 0935   LDLCALC 74 12/22/2021 0900   LDLDIRECT 63.9 10/06/2021 1147    PHYSICAL EXAM:    VS:  BP 139/86 (BP Location: Left Arm, Patient Position: Sitting, Cuff Size: Normal)   Pulse 84   Ht 5\' 5"  (1.651 m)   Wt 195 lb (88.5 kg)   SpO2 93%   BMI 32.45 kg/m   BMI: Body mass index is 32.45 kg/m.  Physical Exam Vitals reviewed.  Constitutional:      Appearance: She is well-developed.  HENT:     Head: Normocephalic and atraumatic.  Eyes:     General:        Right eye: No discharge.        Left eye: No discharge.  Neck:     Vascular: No JVD.  Cardiovascular:     Rate and Rhythm: Normal rate and regular rhythm.     Heart sounds: Normal heart sounds, S1 normal and S2 normal. Heart sounds not distant. No midsystolic click and no opening snap. No murmur heard.    No friction rub.  Pulmonary:     Effort: Pulmonary effort is normal. No respiratory distress.     Breath sounds: Normal breath sounds. No decreased breath sounds, wheezing, rhonchi or rales.  Chest:     Chest wall: No tenderness.  Abdominal:     General: There is no distension.  Musculoskeletal:     Cervical back: Normal range of motion.     Right lower leg: No edema.     Left lower leg: No edema.  Skin:    General: Skin is warm and dry.     Nails: There is no clubbing.  Neurological:     Mental Status: She is alert and oriented to person, place, and time.  Psychiatric:        Speech: Speech normal.        Behavior: Behavior normal.        Thought Content: Thought content normal.         Judgment: Judgment normal.     Wt Readings from Last 3 Encounters:  07/11/23 195 lb (88.5 kg)  06/21/23 199 lb 9.6 oz (90.5 kg)  06/20/23 199 lb 5.8 oz (90.4 kg)     ASSESSMENT & PLAN:   CAD involving the native coronary arteries without angina: She continues to do well and is without symptoms of angina or cardiac decompensation.  Continue aggressive risk factor modification and secondary prevention including aspirin in place of aspirin given history of A-fib along with carvedilol and rosuvastatin.  No indication for further ischemic testing at this time.  HFrEF secondary to ICM status post ICD: She is euvolemic and well compensated with NYHA class II-III symptoms.  Recent echo showed persistent, though stable cardiomyopathy with an EF of 30 to 35%.  No device alarms or shocks.  To further optimize GDMT, titrate Coreg to 25 mg twice daily with continuation of Entresto 97/23 mg twice daily Farxiga 10 mg daily, furosemide, and spironolactone 12.5 mg daily.  Would not titrate spironolactone any further given prior history of hyperkalemia on higher doses of spironolactone.  Follow-up with EP for ongoing management of ICD.  PAF: Maintaining sinus rhythm.  Titrate carvedilol to 25 mg twice daily as outlined above.  CHA2DS2-VASc at least 5 (CHF, HTN, age x 1, vascular disease, sex category).  She remains on apixaban 5 mg twice daily and does not meet reduced dosing criteria.  No symptoms concerning for bleeding.  Recent labs stable.  Follow-up with A-fib clinic as directed.  HTN: Blood pressure is mildly elevated in the office today.  Titrate carvedilol as outlined above with continuation of Entresto and spironolactone.  HLD: LDL 31 in 11/2022 with normal AST/ALT at that time.  She remains on rosuvastatin 40 mg.  Preprocedure cardiac risk stratification: She is planning to undergo colonoscopy with date to be determined.  Per [facility] activity status index, she can achieve > 4 METs.  Per revised cardiac  risk index, she is class II risk with an estimated rate of 6% for adverse cardiac event in the periprocedural timeframe.  As recommended by our pharmacy team, she may hold apixaban for 2 days prior to procedure with recommendation for the GI team to resume the apixaban as soon as safely possible postprocedure to minimize interruption of OAC.  She is without symptoms of angina or cardiac decompensation.  No further cardiac testing is indicated prior to patient undergoing noncardiac procedure.  She may proceed at an overall class II risk.     Disposition: F/u with Dr. Okey Dupre or an APP in 6 months, and EP as directed.    Medication Adjustments/Labs and Tests Ordered: Current medicines are reviewed at length with the patient today.  Concerns regarding medicines are outlined above. Medication changes, Labs and Tests ordered today are summarized above  and listed in the Patient Instructions accessible in Encounters.   Signed, Eula Listen, PA-C 07/11/2023 12:25 PM     [facility] [place] [address] [place], [place] [address] [phone]

## 2023-07-11 ENCOUNTER — Encounter: Payer: Self-pay | Admitting: Physician Assistant

## 2023-07-11 ENCOUNTER — Ambulatory Visit: Payer: Medicare Other | Attending: Physician Assistant | Admitting: Physician Assistant

## 2023-07-11 VITALS — BP 139/86 | HR 84 | Ht 65.0 in | Wt 195.0 lb

## 2023-07-11 DIAGNOSIS — I10 Essential (primary) hypertension: Secondary | ICD-10-CM | POA: Insufficient documentation

## 2023-07-11 DIAGNOSIS — I255 Ischemic cardiomyopathy: Secondary | ICD-10-CM | POA: Diagnosis not present

## 2023-07-11 DIAGNOSIS — I480 Paroxysmal atrial fibrillation: Secondary | ICD-10-CM | POA: Insufficient documentation

## 2023-07-11 DIAGNOSIS — Z01810 Encounter for preprocedural cardiovascular examination: Secondary | ICD-10-CM | POA: Diagnosis not present

## 2023-07-11 DIAGNOSIS — E785 Hyperlipidemia, unspecified: Secondary | ICD-10-CM | POA: Insufficient documentation

## 2023-07-11 DIAGNOSIS — I5022 Chronic systolic (congestive) heart failure: Secondary | ICD-10-CM | POA: Diagnosis not present

## 2023-07-11 DIAGNOSIS — I2510 Atherosclerotic heart disease of native coronary artery without angina pectoris: Secondary | ICD-10-CM | POA: Insufficient documentation

## 2023-07-11 MED ORDER — CARVEDILOL 25 MG PO TABS: 25.0000 mg | ORAL_TABLET | Freq: Two times a day (BID) | ORAL | 3 refills | Status: DC

## 2023-07-11 NOTE — Patient Instructions (Signed)
Medication Instructions:  Your physician recommends the following medication changes.  INCREASE: Coreg 25 mg twice daily  Per Ryan: Hold Eliquis two days prior to your colonoscopy and resume taking it when your GI team advises   *If you need a refill on your cardiac medications before your next appointment, please call your pharmacy*   Follow-Up: At [facility], you and your health needs are our priority.  As part of our continuing mission to provide you with exceptional heart care, we have created designated Provider Care Teams.  These Care Teams include your primary Cardiologist (physician) and Advanced Practice Providers (APPs -  Physician Assistants and Nurse Practitioners) who all work together to provide you with the care you need, when you need it.  We recommend signing up for the patient portal called "MyChart".  Sign up information is provided on this After Visit Summary.  MyChart is used to connect with patients for Virtual Visits (Telemedicine).  Patients are able to view lab/test results, encounter notes, upcoming appointments, etc.  Non-urgent messages can be sent to your provider as well.   To learn more about what you can do with MyChart, go to [email or web address].    Your next appointment:   6 month(s)  Provider:   You may see Yvonne Kendall, MD or one of the following Advanced Practice Providers on your designated Care Team:   Eula Listen, [place]

## 2023-07-14 DIAGNOSIS — Z23 Encounter for immunization: Secondary | ICD-10-CM | POA: Diagnosis not present

## 2023-07-15 ENCOUNTER — Other Ambulatory Visit: Payer: Self-pay | Admitting: Internal Medicine

## 2023-07-16 ENCOUNTER — Encounter: Payer: Self-pay | Admitting: *Deleted

## 2023-07-16 ENCOUNTER — Other Ambulatory Visit: Payer: Self-pay | Admitting: Gastroenterology

## 2023-07-16 DIAGNOSIS — Z006 Encounter for examination for normal comparison and control in clinical research program: Secondary | ICD-10-CM

## 2023-07-16 NOTE — Research (Signed)
Spoke to [name] [name] as part of [name] research follow up. She reports feeling good, no abd pain, no visits to the Ed or urgent care. Her cardiologist did change her coreg to 25 mg 2 times a day to optimize treatment.  (See note from 07-11-2023). Informed her I would call her again next month to follow up.    Current Outpatient Medications:    acetaminophen (TYLENOL) 650 MG CR tablet, Take 1,300 mg by mouth every 8 (eight) hours as needed for pain., Disp: , Rfl:    alendronate (FOSAMAX) 70 MG tablet, TAKE 1 TABLET (70 MG TOTAL) BY MOUTH EVERY 7 DAYS WITH FULL GLASS WATER ON EMPTY STOMACH, Disp: 12 tablet, Rfl: 3   apixaban (ELIQUIS) 5 MG TABS tablet, TAKE 1 TABLET BY MOUTH TWICE A DAY, Disp: 60 tablet, Rfl: 5   bismuth subsalicylate (PEPTO BISMOL) 262 MG/15ML suspension, Take 30 mLs by mouth every 6 (six) hours as needed for indigestion or diarrhea or loose stools., Disp: , Rfl:    Calcium Carbonate-Vitamin D 600-400 MG-UNIT tablet, Take 1 tablet by mouth daily. , Disp: , Rfl:    carvedilol (COREG) 25 MG tablet, Take 1 tablet (25 mg total) by mouth 2 (two) times daily with a meal., Disp: 180 tablet, Rfl: 3   cetirizine (ZYRTEC) 10 MG tablet, Take 10 mg by mouth daily as needed for allergies., Disp: , Rfl:    dapagliflozin propanediol (FARXIGA) 10 MG TABS tablet, Take 1 tablet (10 mg total) by mouth daily. Pt needs to keep July appt for further refills., Disp: 30 tablet, Rfl: 0   FLUoxetine (PROZAC) 20 MG capsule, Take 1 capsule by mouth once daily, Disp: 90 capsule, Rfl: 2   fluticasone (FLONASE) 50 MCG/ACT nasal spray, SPRAY 2 SPRAYS INTO EACH NOSTRIL EVERY DAY (Patient taking differently: 1 spray daily as needed for allergies.), Disp: 48 mL, Rfl: 1   furosemide (LASIX) 40 MG tablet, TAKE 1/2 TABLET BY MOUTH DAILY, Disp: 45 tablet, Rfl: 0   Glycerin-Hypromellose-PEG 400 (DRY EYE RELIEF DROPS) 0.2-0.2-1 % SOLN, Place 1 drop into both eyes daily as needed (Dry eye)., Disp: , Rfl:    Grape Seed Extract  60 MG CAPS, Take 60 mg by mouth daily at 2 PM., Disp: , Rfl:    Misc Natural Products (OSTEO BI-FLEX ADV JOINT SHIELD) TABS, Take 1 tablet by mouth 2 (two) times daily., Disp: , Rfl:    Multiple Vitamin (MULTIVITAMIN WITH MINERALS) TABS tablet, Take 1 tablet by mouth daily., Disp: , Rfl:    Multiple Vitamins-Minerals (EYE VITAMINS) CAPS, Take 1 capsule by mouth daily. Vision Shield, Disp: , Rfl:    nystatin cream (MYCOSTATIN), Apply topically as needed., Disp: , Rfl:    pseudoephedrine-acetaminophen (TYLENOL SINUS) 30-500 MG TABS tablet, Take 2 tablets by mouth every 4 (four) hours as needed (Sinus)., Disp: , Rfl:    rosuvastatin (CRESTOR) 40 MG tablet, TAKE 1 TABLET BY MOUTH EVERY DAY, Disp: 90 tablet, Rfl: 0   sacubitril-valsartan (ENTRESTO) 97-103 MG, TAKE 1 TABLET BY MOUTH TWICE A DAY, Disp: 60 tablet, Rfl: 5   spironolactone (ALDACTONE) 25 MG tablet, Take 0.5 tablets (12.5 mg total) by mouth daily., Disp: 45 tablet, Rfl: 3   Study - [name] - olezarsen 50 mg, 80 mg or placebo SQ injection (PI-Hilty), Inject 80 mg into the skin once. For Investigational Use Only. Injection subcutaneously in protocol approved injection sites (abdomen, thigh or outer area of upper arm). Every 4 weeks. Please contact [facility] for any questions or  concerns regarding this medication., Disp: , Rfl:   Current Facility-Administered Medications:    Study - [name] - olezarsen 50 mg, 80 mg or placebo SQ injection (PI-Hilty), 80 mg, Subcutaneous, Q28 days, , 80 mg at 06/05/23 1019

## 2023-07-17 NOTE — Progress Notes (Signed)
Remote ICD transmission.   

## 2023-07-19 ENCOUNTER — Encounter (HOSPITAL_COMMUNITY): Payer: Self-pay | Admitting: Gastroenterology

## 2023-07-19 NOTE — Progress Notes (Signed)
Attempted to obtain medical history for pre op call via telephone, unable to reach at this time. HIPAA compliant voicemail message left requesting return call to pre surgical testing department.

## 2023-07-24 ENCOUNTER — Ambulatory Visit (HOSPITAL_COMMUNITY)
Admission: RE | Admit: 2023-07-24 | Discharge: 2023-07-24 | Disposition: A | Discharge status: Home / Self Care | Payer: Medicare Other | Attending: Gastroenterology | Admitting: Gastroenterology

## 2023-07-24 ENCOUNTER — Ambulatory Visit (HOSPITAL_COMMUNITY): Payer: Medicare Other | Admitting: Anesthesiology

## 2023-07-24 ENCOUNTER — Encounter (HOSPITAL_COMMUNITY): Payer: Self-pay | Admitting: Gastroenterology

## 2023-07-24 ENCOUNTER — Other Ambulatory Visit: Payer: Self-pay

## 2023-07-24 ENCOUNTER — Encounter (HOSPITAL_COMMUNITY)
Admission: RE | Disposition: A | Discharge status: Home / Self Care | Payer: Self-pay | Source: Home / Self Care | Attending: Gastroenterology

## 2023-07-24 DIAGNOSIS — D126 Benign neoplasm of colon, unspecified: Secondary | ICD-10-CM | POA: Diagnosis not present

## 2023-07-24 DIAGNOSIS — E119 Type 2 diabetes mellitus without complications: Secondary | ICD-10-CM | POA: Diagnosis not present

## 2023-07-24 DIAGNOSIS — Z1211 Encounter for screening for malignant neoplasm of colon: Secondary | ICD-10-CM | POA: Insufficient documentation

## 2023-07-24 DIAGNOSIS — I480 Paroxysmal atrial fibrillation: Secondary | ICD-10-CM | POA: Diagnosis not present

## 2023-07-24 DIAGNOSIS — I509 Heart failure, unspecified: Secondary | ICD-10-CM | POA: Diagnosis not present

## 2023-07-24 DIAGNOSIS — I5022 Chronic systolic (congestive) heart failure: Secondary | ICD-10-CM | POA: Diagnosis not present

## 2023-07-24 DIAGNOSIS — K648 Other hemorrhoids: Secondary | ICD-10-CM | POA: Diagnosis not present

## 2023-07-24 DIAGNOSIS — Z7902 Long term (current) use of antithrombotics/antiplatelets: Secondary | ICD-10-CM | POA: Diagnosis not present

## 2023-07-24 DIAGNOSIS — Z800 Family history of malignant neoplasm of digestive organs: Secondary | ICD-10-CM | POA: Diagnosis not present

## 2023-07-24 DIAGNOSIS — Z860100 Personal history of colon polyps, unspecified: Secondary | ICD-10-CM | POA: Diagnosis not present

## 2023-07-24 DIAGNOSIS — Z87891 Personal history of nicotine dependence: Secondary | ICD-10-CM | POA: Diagnosis not present

## 2023-07-24 DIAGNOSIS — Z860102 Personal history of hyperplastic colon polyps: Secondary | ICD-10-CM | POA: Diagnosis not present

## 2023-07-24 DIAGNOSIS — D123 Benign neoplasm of transverse colon: Secondary | ICD-10-CM | POA: Diagnosis not present

## 2023-07-24 DIAGNOSIS — I110 Hypertensive heart disease with heart failure: Secondary | ICD-10-CM | POA: Insufficient documentation

## 2023-07-24 DIAGNOSIS — K635 Polyp of colon: Secondary | ICD-10-CM | POA: Insufficient documentation

## 2023-07-24 DIAGNOSIS — I2510 Atherosclerotic heart disease of native coronary artery without angina pectoris: Secondary | ICD-10-CM | POA: Diagnosis not present

## 2023-07-24 DIAGNOSIS — Z09 Encounter for follow-up examination after completed treatment for conditions other than malignant neoplasm: Secondary | ICD-10-CM | POA: Diagnosis not present

## 2023-07-24 DIAGNOSIS — I4891 Unspecified atrial fibrillation: Secondary | ICD-10-CM

## 2023-07-24 HISTORY — PX: POLYPECTOMY: SHX5525

## 2023-07-24 HISTORY — PX: COLONOSCOPY WITH PROPOFOL: SHX5780

## 2023-07-24 SURGERY — COLONOSCOPY WITH PROPOFOL
Anesthesia: Monitor Anesthesia Care | Laterality: Bilateral

## 2023-07-24 MED ORDER — PROPOFOL 500 MG/50ML IV EMUL
INTRAVENOUS | Status: DC | PRN
  Administered 2023-07-24: 75 ug/kg/min via INTRAVENOUS

## 2023-07-24 MED ORDER — SODIUM CHLORIDE 0.9 % IV SOLN: INTRAVENOUS | Status: DC

## 2023-07-24 MED ORDER — PHENYLEPHRINE HCL (PRESSORS) 10 MG/ML IV SOLN
INTRAVENOUS | Status: DC | PRN
  Administered 2023-07-24: 120 ug via INTRAVENOUS
  Administered 2023-07-24: 160 ug via INTRAVENOUS

## 2023-07-24 MED ORDER — MIDAZOLAM HCL 2 MG/2ML IJ SOLN
INTRAMUSCULAR | Status: AC
Start: 2023-07-24 — End: ?
  Filled 2023-07-24: qty 2

## 2023-07-24 MED ORDER — APIXABAN 5 MG PO TABS: 5.0000 mg | ORAL_TABLET | Freq: Two times a day (BID) | ORAL | 5 refills | Status: DC

## 2023-07-24 MED ORDER — MIDAZOLAM HCL 5 MG/5ML IJ SOLN
INTRAMUSCULAR | Status: DC | PRN
  Administered 2023-07-24: 2 mg via INTRAVENOUS

## 2023-07-24 SURGICAL SUPPLY — 22 items

## 2023-07-24 NOTE — Anesthesia Preprocedure Evaluation (Signed)
Anesthesia Evaluation  Patient identified by MRN, date of birth, ID band Patient awake    Reviewed: Allergy & Precautions, NPO status , Patient's Chart, lab work & pertinent test results, reviewed documented beta blocker date and time   History of Anesthesia Complications Negative for: history of anesthetic complications  Airway Mallampati: II  TM Distance: >3 FB Neck ROM: Full    Dental  (+) Missing,    Pulmonary former smoker   Pulmonary exam normal        Cardiovascular hypertension, Pt. on medications and Pt. on home beta blockers + CAD (on Plavix) and +CHF  Normal cardiovascular exam  TTE 01/2021: EF 30-35%, global hypokinesis, moderate to severe LVE, grade 1 DD, akinesis of the left ventricular, basal-mid inferior wall, valves ok (on 2021 TTE)   Neuro/Psych    Depression    negative neurological ROS     GI/Hepatic negative GI ROS, Neg liver ROS,,,  Endo/Other  diabetes, Type 2  BMI 36  Renal/GU negative Renal ROS  negative genitourinary   Musculoskeletal  (+) Arthritis , Osteoarthritis,    Abdominal   Peds  Hematology negative hematology ROS (+)   Anesthesia Other Findings Day of surgery medications reviewed with patient.  Reproductive/Obstetrics negative OB ROS                             Anesthesia Physical Anesthesia Plan  ASA: 4  Anesthesia Plan: MAC   Post-op Pain Management:    Induction: Intravenous  PONV Risk Score and Plan: 3 and Treatment may vary due to age or medical condition, Ondansetron and Dexamethasone  Airway Management Planned: Simple Face Mask  Additional Equipment:   Intra-op Plan:   Post-operative Plan:   Informed Consent: I have reviewed the patients History and Physical, chart, labs and discussed the procedure including the risks, benefits and alternatives for the proposed anesthesia with the patient or authorized representative who has indicated  his/her understanding and acceptance.     Dental advisory given  Plan Discussed with: CRNA  Anesthesia Plan Comments:         Anesthesia Quick Evaluation

## 2023-07-24 NOTE — Anesthesia Postprocedure Evaluation (Signed)
Anesthesia Post Note  Patient: [name] [name]  Procedure(s) Performed: COLONOSCOPY WITH PROPOFOL (Bilateral) POLYPECTOMY     Patient location during evaluation: PACU Anesthesia Type: MAC Level of consciousness: awake and alert Pain management: pain level controlled Vital Signs Assessment: post-procedure vital signs reviewed and stable Respiratory status: spontaneous breathing, nonlabored ventilation and respiratory function stable Cardiovascular status: blood pressure returned to baseline and stable Postop Assessment: no apparent nausea or vomiting Anesthetic complications: no   No notable events documented.  Last Vitals:  Vitals:   07/24/23 0930 07/24/23 0941  BP: 119/68 (!) 113/53  Pulse: 70 66  Resp: 19 18  Temp:    SpO2: 93% 93%    Last Pain:  Vitals:   07/24/23 0941  TempSrc:   PainSc: 0-No pain                 Lowella Curb

## 2023-07-24 NOTE — Transfer of Care (Signed)
Immediate Anesthesia Transfer of Care Note  Patient: [name] [name]  Procedure(s) Performed: COLONOSCOPY WITH PROPOFOL (Bilateral) POLYPECTOMY  Patient Location: Endoscopy Unit  Anesthesia Type:MAC  Level of Consciousness: drowsy  Airway & Oxygen Therapy: Patient Spontanous Breathing and Patient connected to face mask oxygen  Post-op Assessment: Report given to RN and Post -op Vital signs reviewed and stable  Post vital signs: Reviewed and stable  Last Vitals:  Vitals Value Taken Time  BP 100/52 07/24/23 0906  Temp    Pulse 69 07/24/23 0907  Resp 12 07/24/23 0907  SpO2 97 % 07/24/23 0907  Vitals shown include unfiled device data.  Last Pain:  Vitals:   07/24/23 0757  TempSrc: Temporal  PainSc: 0-No pain         Complications: No notable events documented.

## 2023-07-24 NOTE — Discharge Instructions (Signed)

## 2023-07-24 NOTE — H&P (Signed)
[facility] H/P Note  Chief Complaint: personal history of colonic polyps  HPI: [name] [name] is an 72 y.o. female.  History CHF (EF 30-35%), ICD (placed about 3 years ago).  Family history colon cancer (aunt).  Personal history colonic polyps (sub-cm adenoma removed 2019).  No abdominal pain, blood in stool, change in bowel habits.  Past Medical History:  Diagnosis Date   Allergic rhinitis 09/28/2008   Breast cancer (HCC) 2004   Left Breast Cancer   CHF (congestive heart failure) (HCC) 11/28/2019   Coronary artery disease    a. 10/2012 ETT: nl; b. 12/2012 Cath: LM nl, LAD min irregs, D1 min irregs, LCX 30p, RCA 77m, RPDA 40p, EF 50%.   Diastolic dysfunction    a. 12/2012 Echo: EF 50-55%. No rwma. Gr1 DD. Nl RV fxn. Nl PASP.   Dry eyes 03/25/2021   HLD (hyperlipidemia) 09/28/2008   HTN (hypertension) 06/27/2011   Ischemic cardiomyopathy 12/02/2019   Osteoarthritis 09/28/2008   Osteopenia 11/16/2008    Past Surgical History:  Procedure Laterality Date   ABDOMINAL HYSTERECTOMY  1985   endometriosis; anatomy   BREAST SURGERY  10/2002   CARDIAC CATHETERIZATION  12/19/2012   [facility]; EF 50%   COLONOSCOPY  04/2008   Hyperplastic polyp; repeat in 10 years   CORONARY STENT INTERVENTION N/A 05/03/2020   Procedure: CORONARY STENT INTERVENTION;  Surgeon: Yvonne Kendall, MD;  Location: [facility] INVASIVE CV LAB;  Service: Cardiovascular;  Laterality: N/A;   CORONARY ULTRASOUND/IVUS N/A 05/03/2020   Procedure: Intravascular Ultrasound/IVUS;  Surgeon: Yvonne Kendall, MD;  Location: [facility] INVASIVE CV LAB;  Service: Cardiovascular;  Laterality: N/A;   HIP FRACTURE SURGERY  06/14/2011   ball replaced (Dr. Marciano Sequin)   ICD IMPLANT N/A 03/31/2021   Procedure: ICD IMPLANT;  Surgeon: Lanier Prude, MD;  Location[facility] INVASIVE CV LAB;  Service: Cardiovascular;  Laterality: N/A;   LEFT HEART CATH N/A 05/03/2020   Procedure: Left Heart Cath;  Surgeon: Yvonne Kendall, MD;  Location: [facility] INVASIVE  CV LAB;  Service: Cardiovascular;  Laterality: N/A;   MASTECTOMY Left 2004   MOLE REMOVAL     RIGHT/LEFT HEART CATH AND CORONARY ANGIOGRAPHY N/A 12/02/2019   Procedure: RIGHT/LEFT HEART CATH AND CORONARY ANGIOGRAPHY;  Surgeon: Yvonne Kendall, MD;  Location: [facility] INVASIVE CV LAB;  Service: Cardiovascular;  Laterality: N/A;   TONSILLECTOMY  1958    Facility-Administered Medications Prior to Admission  Medication Dose Route Frequency Provider Last Rate Last Admin   Study - [name] - olezarsen 50 mg, 80 mg or placebo SQ injection (PI-Hilty)  80 mg Subcutaneous Q28 days    80 mg at 06/05/23 1019   Medications Prior to Admission  Medication Sig Dispense Refill   alendronate (FOSAMAX) 70 MG tablet TAKE 1 TABLET (70 MG TOTAL) BY MOUTH EVERY 7 DAYS WITH FULL GLASS WATER ON EMPTY STOMACH 12 tablet 3   carvedilol (COREG) 25 MG tablet Take 1 tablet (25 mg total) by mouth 2 (two) times daily with a meal. 180 tablet 3   cetirizine (ZYRTEC) 10 MG tablet Take 10 mg by mouth daily as needed for allergies.     dapagliflozin propanediol (FARXIGA) 10 MG TABS tablet Take 1 tablet (10 mg total) by mouth daily. Pt needs to keep July appt for further refills. 30 tablet 0   FLUoxetine (PROZAC) 20 MG capsule Take 1 capsule by mouth once daily 90 capsule 2   Glycerin-Hypromellose-PEG 400 (DRY EYE RELIEF DROPS) 0.2-0.2-1 % SOLN Place 1 drop into both eyes daily as  needed (Dry eye).     Grape Seed Extract 60 MG CAPS Take 60 mg by mouth daily at 2 PM.     Misc Natural Products (OSTEO BI-FLEX ADV JOINT SHIELD) TABS Take 1 tablet by mouth 2 (two) times daily.     Multiple Vitamin (MULTIVITAMIN WITH MINERALS) TABS tablet Take 1 tablet by mouth daily.     Multiple Vitamins-Minerals (EYE VITAMINS) CAPS Take 1 capsule by mouth daily. Vision Shield     pseudoephedrine-acetaminophen (TYLENOL SINUS) 30-500 MG TABS tablet Take 2 tablets by mouth every 4 (four) hours as needed (Sinus).     rosuvastatin (CRESTOR) 40 MG tablet TAKE  1 TABLET BY MOUTH EVERY DAY 90 tablet 0   sacubitril-valsartan (ENTRESTO) 97-103 MG TAKE 1 TABLET BY MOUTH TWICE A DAY 60 tablet 5   spironolactone (ALDACTONE) 25 MG tablet Take 0.5 tablets (12.5 mg total) by mouth daily. 45 tablet 3   acetaminophen (TYLENOL) 650 MG CR tablet Take 1,300 mg by mouth every 8 (eight) hours as needed for pain.     apixaban (ELIQUIS) 5 MG TABS tablet TAKE 1 TABLET BY MOUTH TWICE A DAY 60 tablet 5   bismuth subsalicylate (PEPTO BISMOL) 262 MG/15ML suspension Take 30 mLs by mouth every 6 (six) hours as needed for indigestion or diarrhea or loose stools.     Calcium Carbonate-Vitamin D 600-400 MG-UNIT tablet Take 1 tablet by mouth daily.      fluticasone (FLONASE) 50 MCG/ACT nasal spray SPRAY 2 SPRAYS INTO EACH NOSTRIL EVERY DAY (Patient taking differently: 1 spray daily as needed for allergies.) 48 mL 1   furosemide (LASIX) 40 MG tablet TAKE 1/2 TABLET BY MOUTH DAILY 45 tablet 0   nystatin cream (MYCOSTATIN) Apply topically as needed.     Study - [name] - olezarsen 50 mg, 80 mg or placebo SQ injection (PI-Hilty) Inject 80 mg into the skin once. For Investigational Use Only. Injection subcutaneously in protocol approved injection sites (abdomen, thigh or outer area of upper arm). Every 4 weeks. Please contact  Cardiology for any questions or concerns regarding this medication.      Allergies:  Allergies  Allergen Reactions   Codeine Other (See Comments)    Reation:  Hallucinations    Elemental Sulfur Rash   Morphine Other (See Comments)    Reaction:  Hallucinations    Ace Inhibitors Cough   Sulfonamide Derivatives Rash    Family History  Problem Relation Age of Onset   Heart attack Father 18   Heart disease Father    Sarcoidosis Mother    Ovarian cancer Other        Aunt   Diabetes Other        GP   Breast cancer Sister 93   Cancer Sister        breast   Cancer Paternal Aunt        colon/ovarian    Social History:  reports that she quit  smoking about 37 years ago. Her smoking use included cigarettes. She started smoking about 47 years ago. She has a 5 pack-year smoking history. She has been exposed to tobacco smoke. She quit smokeless tobacco use about 37 years ago. She reports that she does not drink alcohol and does not use drugs.   ROS: As per HPI, all others negative   Blood pressure (!) 146/84, pulse 79, temperature 98 F (36.7 C), temperature source Temporal, resp. rate (!) 22, SpO2 93%. General appearance: NAD HEENT:  [place]/AT, anicteric CV:  ICD, regular  RESP:  No visible distress ABD:  Soft, non-tender NEURO:  No encephalopathy  No results found for this or any previous visit (from the past 48 hour(s)). No results found.  Assessment/Plan   Personal history of colonic polyps. Chronic anticoagulation:  eliquis on hold 3 days. Colonoscopy. Risks (bleeding, infection, bowel perforation that could require surgery, sedation-related changes in cardiopulmonary systems), benefits (identification and possible treatment of source of symptoms, exclusion of certain causes of symptoms), and alternatives (watchful waiting, radiographic imaging studies, empiric medical treatment) of colonoscopy were explained to patient/family in detail and patient wishes to proceed.   Freddy Jaksch 07/24/2023, 8:22 AM

## 2023-07-24 NOTE — Op Note (Signed)
[facility] Patient Name: [name] [name] Procedure Date: 07/24/2023 MRN: [id number] Attending MD: Willis Modena , MD, [id number] Date of Birth: Jan 04, 1951 CSN: [id number] Age: 72 Admit Type: Outpatient Procedure:                Colonoscopy Indications:              High risk colon cancer surveillance: Personal                            history of colonic polyps Providers:                Willis Modena, MD, Stephens Shire RN, RN, Rozetta Nunnery, Technician, Derinda Late, Technician Referring MD:              Medicines:                Monitored Anesthesia Care Complications:            No immediate complications. Estimated Blood Loss:     Estimated blood loss: none. Procedure:                Pre-Anesthesia Assessment:                           - Prior to the procedure, a History and Physical                            was performed, and patient medications and                            allergies were reviewed. The patient's tolerance of                            previous anesthesia was also reviewed. The risks                            and benefits of the procedure and the sedation                            options and risks were discussed with the patient.                            All questions were answered, and informed consent                            was obtained. Prior Anticoagulants: The patient has                            taken Eliquis (apixaban), last dose was 3 days                            prior to procedure. ASA Grade Assessment: IV - A  patient with severe systemic disease that is a                            constant threat to life. After reviewing the risks                            and benefits, the patient was deemed in                            satisfactory condition to undergo the procedure.                           After obtaining informed consent, the colonoscope                             was passed under direct vision. Throughout the                            procedure, the patient's blood pressure, pulse, and                            oxygen saturations were monitored continuously. The                            [id number]-HQ190L ([id number]) Olympus colonoscope was                            introduced through the anus and advanced to the the                            cecum, identified by appendiceal orifice and                            ileocecal valve. The ileocecal valve, appendiceal                            orifice, and rectum were photographed. The entire                            colon was examined. The colonoscopy was performed                            without difficulty. The patient tolerated the                            procedure well. The quality of the bowel                            preparation was adequate. Scope In: 8:41:52 AM Scope Out: 9:00:17 AM Scope Withdrawal Time: 0 hours 15 minutes 9 seconds  Total Procedure Duration: 0 hours 18 minutes 25 seconds  Findings:      Hemorrhoids were found on perianal exam.      Internal hemorrhoids were found during retroflexion. The hemorrhoids  were small.      Two sessile polyps were found in the transverse colon. The polyps were 4       to 5 mm in size. These polyps were removed with a cold snare. Resection       and retrieval were complete.      A 3 mm polyp was found in the cecum. The polyp was sessile. The polyp       was removed with a cold biopsy forceps. Resection and retrieval were       complete.      Colon otherwise normal; no other polyps, masses, vascular ectasias, or       inflammatory changes were seen.      No additional abnormalities were found on retroflexion. Impression:               - Hemorrhoids found on perianal exam.                           - Internal hemorrhoids.                           - Two 4 to 5 mm polyps in the transverse colon,                             removed with a cold snare. Resected and retrieved.                           - One 3 mm polyp in the cecum, removed with a cold                            biopsy forceps. Resected and retrieved.                           - The examination was otherwise normal. Moderate Sedation:      Not Applicable - Patient had care per Anesthesia. Recommendation:           - Patient has a contact number available for                            emergencies. The signs and symptoms of potential                            delayed complications were discussed with the                            patient. Return to normal activities tomorrow.                            Written discharge instructions were provided to the                            patient.                           - Soft diet today.                           -  Continue present medications.                           - Resume Eliquis (apixaban) at prior dose tomorrow.                           - Await pathology results.                           - Repeat colonoscopy (date not yet determined) for                            surveillance based on pathology results.                           - Return to GI clinic PRN.                           - Return to referring physician as previously                            scheduled. Procedure Code(s):        --- Professional ---                           [phone], Colonoscopy, flexible; with removal of                            tumor(s), polyp(s), or other lesion(s) by snare                            technique                           [id number], 59, Colonoscopy, flexible; with biopsy,                            single or multiple Diagnosis Code(s):        --- Professional ---                           Z86.010, Personal history of colonic polyps                           D12.3, Benign neoplasm of transverse colon (hepatic                            flexure or splenic flexure)                           D12.0,  Benign neoplasm of cecum                           K64.8, Other hemorrhoids CPT copyright 2022 American [employer] Association. All rights reserved. The codes documented in this report are preliminary and upon coder review may  be revised to meet current compliance requirements. Willis Modena, MD 07/24/2023 9:08:32 AM This  report has been signed electronically. Number of Addenda: 0

## 2023-07-25 LAB — SURGICAL PATHOLOGY

## 2023-07-26 ENCOUNTER — Encounter (HOSPITAL_COMMUNITY): Payer: Self-pay | Admitting: Gastroenterology

## 2023-07-26 ENCOUNTER — Other Ambulatory Visit: Payer: Self-pay | Admitting: Cardiology

## 2023-08-02 DIAGNOSIS — D2261 Melanocytic nevi of right upper limb, including shoulder: Secondary | ICD-10-CM | POA: Diagnosis not present

## 2023-08-02 DIAGNOSIS — D2271 Melanocytic nevi of right lower limb, including hip: Secondary | ICD-10-CM | POA: Diagnosis not present

## 2023-08-02 DIAGNOSIS — Z85820 Personal history of malignant melanoma of skin: Secondary | ICD-10-CM | POA: Diagnosis not present

## 2023-08-02 DIAGNOSIS — D2262 Melanocytic nevi of left upper limb, including shoulder: Secondary | ICD-10-CM | POA: Diagnosis not present

## 2023-08-02 DIAGNOSIS — D2272 Melanocytic nevi of left lower limb, including hip: Secondary | ICD-10-CM | POA: Diagnosis not present

## 2023-08-02 DIAGNOSIS — D225 Melanocytic nevi of trunk: Secondary | ICD-10-CM | POA: Diagnosis not present

## 2023-08-03 DIAGNOSIS — H353132 Nonexudative age-related macular degeneration, bilateral, intermediate dry stage: Secondary | ICD-10-CM | POA: Diagnosis not present

## 2023-08-03 DIAGNOSIS — H25813 Combined forms of age-related cataract, bilateral: Secondary | ICD-10-CM | POA: Diagnosis not present

## 2023-08-03 DIAGNOSIS — H40013 Open angle with borderline findings, low risk, bilateral: Secondary | ICD-10-CM | POA: Diagnosis not present

## 2023-08-03 DIAGNOSIS — H02821 Cysts of right upper eyelid: Secondary | ICD-10-CM | POA: Diagnosis not present

## 2023-08-14 ENCOUNTER — Encounter: Payer: Self-pay | Admitting: *Deleted

## 2023-08-14 DIAGNOSIS — Z006 Encounter for examination for normal comparison and control in clinical research program: Secondary | ICD-10-CM

## 2023-08-14 NOTE — Research (Signed)
Spoke to [name] [name] as part of follow up for [employer]. She reports no abd pain, no visits to the Ed or urgent care, but does report a change in her meds. Coreg was increased to 50 mg daily on 08-13-23. Scheduled her next appointment for Jan 9th at 0800.     Current Outpatient Medications:    acetaminophen (TYLENOL) 650 MG CR tablet, Take 1,300 mg by mouth every 8 (eight) hours as needed for pain., Disp: , Rfl:    alendronate (FOSAMAX) 70 MG tablet, TAKE 1 TABLET (70 MG TOTAL) BY MOUTH EVERY 7 DAYS WITH FULL GLASS WATER ON EMPTY STOMACH, Disp: 12 tablet, Rfl: 3   apixaban (ELIQUIS) 5 MG TABS tablet, Take 1 tablet (5 mg total) by mouth 2 (two) times daily., Disp: 60 tablet, Rfl: 5   bismuth subsalicylate (PEPTO BISMOL) 262 MG/15ML suspension, Take 30 mLs by mouth every 6 (six) hours as needed for indigestion or diarrhea or loose stools., Disp: , Rfl:    Calcium Carbonate-Vitamin D 600-400 MG-UNIT tablet, Take 1 tablet by mouth daily. , Disp: , Rfl:    carvedilol (COREG) 25 MG tablet, Take 1 tablet (25 mg total) by mouth 2 (two) times daily with a meal., Disp: 180 tablet, Rfl: 3   cetirizine (ZYRTEC) 10 MG tablet, Take 10 mg by mouth daily as needed for allergies., Disp: , Rfl:    dapagliflozin propanediol (FARXIGA) 10 MG TABS tablet, Take 1 tablet (10 mg total) by mouth daily., Disp: 30 tablet, Rfl: 10   FLUoxetine (PROZAC) 20 MG capsule, Take 1 capsule by mouth once daily, Disp: 90 capsule, Rfl: 2   fluticasone (FLONASE) 50 MCG/ACT nasal spray, SPRAY 2 SPRAYS INTO EACH NOSTRIL EVERY DAY (Patient taking differently: 1 spray daily as needed for allergies.), Disp: 48 mL, Rfl: 1   furosemide (LASIX) 40 MG tablet, TAKE 1/2 TABLET BY MOUTH DAILY, Disp: 45 tablet, Rfl: 0   Glycerin-Hypromellose-PEG 400 (DRY EYE RELIEF DROPS) 0.2-0.2-1 % SOLN, Place 1 drop into both eyes daily as needed (Dry eye)., Disp: , Rfl:    Grape Seed Extract 60 MG CAPS, Take 60 mg by mouth daily at 2 PM., Disp: , Rfl:    Misc  Natural Products (OSTEO BI-FLEX ADV JOINT SHIELD) TABS, Take 1 tablet by mouth 2 (two) times daily., Disp: , Rfl:    Multiple Vitamin (MULTIVITAMIN WITH MINERALS) TABS tablet, Take 1 tablet by mouth daily., Disp: , Rfl:    Multiple Vitamins-Minerals (EYE VITAMINS) CAPS, Take 1 capsule by mouth daily. Vision Shield, Disp: , Rfl:    nystatin cream (MYCOSTATIN), Apply topically as needed., Disp: , Rfl:    pseudoephedrine-acetaminophen (TYLENOL SINUS) 30-500 MG TABS tablet, Take 2 tablets by mouth every 4 (four) hours as needed (Sinus)., Disp: , Rfl:    rosuvastatin (CRESTOR) 40 MG tablet, TAKE 1 TABLET BY MOUTH EVERY DAY, Disp: 90 tablet, Rfl: 0   sacubitril-valsartan (ENTRESTO) 97-103 MG, TAKE 1 TABLET BY MOUTH TWICE A DAY, Disp: 60 tablet, Rfl: 5   spironolactone (ALDACTONE) 25 MG tablet, Take 0.5 tablets (12.5 mg total) by mouth daily., Disp: 45 tablet, Rfl: 3   Study - [name] - olezarsen 50 mg, 80 mg or placebo SQ injection (PI-Hilty), Inject 80 mg into the skin once. For Investigational Use Only. Injection subcutaneously in protocol approved injection sites (abdomen, thigh or outer area of upper arm). Every 4 weeks. Please contact [facility] for any questions or concerns regarding this medication., Disp: , Rfl:   Current Facility-Administered Medications:  Study - [name] - olezarsen 50 mg, 80 mg or placebo SQ injection (PI-Hilty), 80 mg, Subcutaneous, Q28 days, , 80 mg at 06/05/23 1019

## 2023-09-11 DIAGNOSIS — H524 Presbyopia: Secondary | ICD-10-CM | POA: Diagnosis not present

## 2023-09-11 DIAGNOSIS — H25813 Combined forms of age-related cataract, bilateral: Secondary | ICD-10-CM | POA: Diagnosis not present

## 2023-09-11 DIAGNOSIS — H25811 Combined forms of age-related cataract, right eye: Secondary | ICD-10-CM | POA: Diagnosis not present

## 2023-09-11 DIAGNOSIS — H52213 Irregular astigmatism, bilateral: Secondary | ICD-10-CM | POA: Diagnosis not present

## 2023-09-16 ENCOUNTER — Other Ambulatory Visit: Payer: Self-pay | Admitting: Internal Medicine

## 2023-09-16 ENCOUNTER — Other Ambulatory Visit: Payer: Self-pay | Admitting: Family Medicine

## 2023-09-18 NOTE — Telephone Encounter (Signed)
 Last office visit: 07/11/23 with plan to f/u in 6 months   Next visit: 01/09/24

## 2023-09-20 ENCOUNTER — Encounter: Payer: Self-pay | Admitting: *Deleted

## 2023-09-20 DIAGNOSIS — Z006 Encounter for examination for normal comparison and control in clinical research program: Secondary | ICD-10-CM

## 2023-09-20 NOTE — Research (Signed)
 [name] [name] called states she will not be able to come today. Rescheduled for Jan 15 at 0930.

## 2023-09-26 ENCOUNTER — Encounter: Payer: Self-pay | Admitting: *Deleted

## 2023-09-26 DIAGNOSIS — Z006 Encounter for examination for normal comparison and control in clinical research program: Secondary | ICD-10-CM
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
## 2023-09-27 ENCOUNTER — Ambulatory Visit: Payer: Medicare Other

## 2023-10-03 ENCOUNTER — Encounter: Payer: Self-pay | Admitting: *Deleted

## 2023-10-03 DIAGNOSIS — Z006 Encounter for examination for normal comparison and control in clinical research program: Secondary | ICD-10-CM
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
## 2023-10-05 ENCOUNTER — Other Ambulatory Visit: Payer: Self-pay | Admitting: *Deleted

## 2023-10-09 ENCOUNTER — Other Ambulatory Visit: Payer: Self-pay

## 2023-10-09 ENCOUNTER — Encounter: Payer: Medicare Other | Admitting: *Deleted

## 2023-10-09 VITALS — BP 128/61 | HR 77 | Temp 97.4°F | Resp 18 | Wt 195.0 lb

## 2023-10-09 DIAGNOSIS — Z006 Encounter for examination for normal comparison and control in clinical research program: Secondary | ICD-10-CM

## 2023-10-09 NOTE — Progress Notes (Signed)
She is in today for her final [name] visit.  We discussed the end of the trial, and future expectations regarding the test agent.  She denies any major symptoms and has been off IP for awhile.  No chest pain.  She previously had crackles in the LLL and I suggested a follow up CXR which was benign.    9:15 AM     BP 128/61  BP Location Right Arm  Patient Position Sitting  Cuff Size Large  Pulse Rate 77  Temp 97.4 F (36.3 C)Temp. 97.4 F (36.3 C). Data is abnormal. Taken on 10/09/23 9:15 AM  Temp Source Temporal  Weight 195 lb (88.5 kg)  Resp 18  SpO2 96 %  Pain Score 0-No pain   Alert, oriented female in NAD No JVD No carotid bruits Lungs clear Cor  Regular no murmur Abd soft without hepatosplenomegaly Ext  -  no pitting edema.  Pulses intact Neuro  non focal  ECG   NSR.  Left axis deviation.  Mild IVCD.  Non specific T abnormality.  No change since tracing of 12/2022.     Impression  CAD Hyperlipidemia LV Dysfunction with AICD  Plan  Continue follow up with primary care and CV  Arturo Morton. Riley Kill, MD Medical Director, [facility]

## 2023-10-09 NOTE — Research (Addendum)
 [name] [name] [name] [name] post day 95 09-Oct-2023               Chemistry: Insulin 29.7        [] Clinically Significant                                       [x] Not Clinically Significant    Hematology: Red Blood Cells 5.45         [] Clinically Significant                      [x] Not Clinically Significant  Hemoglobin 16.1 g/dL         [] Clinically Significant                      [x] Not Clinically Significant  Hematocrit 48 %                  [] Clinically Significant                      [x] Not Clinically Significant  RDW 15.2%                         [] Clinically Significant                      [x] Not Clinically Significant   Urinalysis: Glucose >1000   mg/dL                                                                [] Clinically Significant                      [x] Not Clinically Significant  Protein 30 mg/dL                                                                         [] Clinically Significant                      [x] Not Clinically Significant  Leukocyte Esterase 500                                                             [] Clinically Significant                      [x] Not Clinically Significant  Urinary White Blood Cells >75                                                    []   Clinically Significant                      [x] Not Clinically Significant  Urinary Red Blood Cells 6-9             [] Clinically Significant                      [x] Not Clinically Significant  Bacteria 1+  [] Clinically Significant                      [x] Not Clinically Significant  Squamous Epithelial Cells 6-9  [] Clinically Significant                      [x] Not Clinically Significant  Mucus 1+   [] Clinically Significant                      [x] Not Clinically Significant    Coagulation :  Prothrombin Time (PT) 14.7 Sec         [] Clinically Significant   [x] Not Clinically Significant   Any further action needed to be taken per the PI? NO  Chrystie Nose, MD,  [facility], FACP  [place]  [facility]  Medical Director of the [facility]  [facility] Diplomate of the American Board of Clinical Lipidology Attending Cardiologist  Direct Dial: [phone]  Fax: [phone]  Website:  [email or web address]       [name] [name] post Day 65    Subject Number: [id number]              Randomization Number: [id number]            Date:09-Oct-2023     [x] Vital Signs Collected - Blood Pressure:128/61 - Weight: 195 lbs - Heart Rate:77 - Respiratory Rate:18 - Temperature:97.4 - Oxygen Saturation:96%  [x]  Physical Exam Completed by PI or SUB-I  [x]  12-lead ECG  [x]  Extended Urinalysis   [x]  Lab collection per protocol  [x]  (abdominal pain only) since last visit  [x]  Assessment of ER Visits, Hospitalizations, and Inpatient Days  [x]  Adverse Events and Concomitant Medications  [x]  Diet, Lifestyle, and Alcohol Counseling   [name] [name] is here for Post Day 91 of [name] [name]. She reports no changes in her meds, no visits to the Ed or urgent care, and no abd pain. This is her last visit for [name] [name] research. Vs taken at 0915. Blood drawn at 0927. Urine obtained at 0950. Dr Riley Kill did exam and reviewed EKG. Encouraged her to call if she ever has questions or concerns.    Current Outpatient Medications:    acetaminophen (TYLENOL) 650 MG CR tablet, Take 1,300 mg by mouth every 8 (eight) hours as needed for pain., Disp: , Rfl:    alendronate (FOSAMAX) 70 MG tablet, TAKE 1 TABLET (70 MG TOTAL) BY MOUTH EVERY 7 DAYS WITH FULL GLASS WATER ON EMPTY STOMACH, Disp: 12 tablet, Rfl: 0   apixaban (ELIQUIS) 5 MG TABS tablet, Take 1 tablet (5 mg total) by mouth 2 (two) times daily., Disp: 60 tablet, Rfl: 5   bismuth subsalicylate (PEPTO BISMOL) 262 MG/15ML suspension, Take 30 mLs by mouth every 6 (six) hours as needed for indigestion or diarrhea or loose stools., Disp: , Rfl:    Calcium Carbonate-Vitamin D 600-400 MG-UNIT tablet, Take 1  tablet by mouth daily. , Disp: , Rfl:    carvedilol (COREG) 25 MG tablet, Take 1 tablet (25 mg total) by mouth 2 (two)  times daily with a meal., Disp: 180 tablet, Rfl: 3   cetirizine (ZYRTEC) 10 MG tablet, Take 10 mg by mouth daily as needed for allergies., Disp: , Rfl:    dapagliflozin propanediol (FARXIGA) 10 MG TABS tablet, Take 1 tablet (10 mg total) by mouth daily., Disp: 30 tablet, Rfl: 10   FLUoxetine (PROZAC) 20 MG capsule, Take 1 capsule by mouth once daily, Disp: 90 capsule, Rfl: 2   fluticasone (FLONASE) 50 MCG/ACT nasal spray, Place 1 spray into both nostrils daily as needed for allergies., Disp: 48 g, Rfl: 0   furosemide (LASIX) 40 MG tablet, TAKE 1/2 TABLET BY MOUTH DAILY, Disp: 45 tablet, Rfl: 0   Glycerin-Hypromellose-PEG 400 (DRY EYE RELIEF DROPS) 0.2-0.2-1 % SOLN, Place 1 drop into both eyes daily as needed (Dry eye)., Disp: , Rfl:    Grape Seed Extract 60 MG CAPS, Take 60 mg by mouth daily at 2 PM., Disp: , Rfl:    Misc Natural Products (OSTEO BI-FLEX ADV JOINT SHIELD) TABS, Take 1 tablet by mouth 2 (two) times daily., Disp: , Rfl:    Multiple Vitamin (MULTIVITAMIN WITH MINERALS) TABS tablet, Take 1 tablet by mouth daily., Disp: , Rfl:    Multiple Vitamins-Minerals (EYE VITAMINS) CAPS, Take 1 capsule by mouth daily. Vision Shield, Disp: , Rfl:    nystatin cream (MYCOSTATIN), Apply topically as needed., Disp: , Rfl:    pseudoephedrine-acetaminophen (TYLENOL SINUS) 30-500 MG TABS tablet, Take 2 tablets by mouth every 4 (four) hours as needed (Sinus)., Disp: , Rfl:    rosuvastatin (CRESTOR) 40 MG tablet, TAKE 1 TABLET BY MOUTH EVERY DAY, Disp: 90 tablet, Rfl: 1   sacubitril-valsartan (ENTRESTO) 97-103 MG, TAKE 1 TABLET BY MOUTH TWICE A DAY, Disp: 60 tablet, Rfl: 5   spironolactone (ALDACTONE) 25 MG tablet, Take 0.5 tablets (12.5 mg total) by mouth daily., Disp: 45 tablet, Rfl: 3

## 2023-10-23 DIAGNOSIS — H268 Other specified cataract: Secondary | ICD-10-CM | POA: Diagnosis not present

## 2023-10-23 DIAGNOSIS — H25813 Combined forms of age-related cataract, bilateral: Secondary | ICD-10-CM | POA: Diagnosis not present

## 2023-10-23 DIAGNOSIS — H2511 Age-related nuclear cataract, right eye: Secondary | ICD-10-CM | POA: Diagnosis not present

## 2023-11-03 ENCOUNTER — Other Ambulatory Visit: Payer: Self-pay | Admitting: Family Medicine

## 2023-11-03 ENCOUNTER — Other Ambulatory Visit: Payer: Self-pay | Admitting: Internal Medicine

## 2023-11-03 DIAGNOSIS — Z79899 Other long term (current) drug therapy: Secondary | ICD-10-CM

## 2023-11-20 DIAGNOSIS — H268 Other specified cataract: Secondary | ICD-10-CM | POA: Diagnosis not present

## 2023-11-20 DIAGNOSIS — H2512 Age-related nuclear cataract, left eye: Secondary | ICD-10-CM | POA: Diagnosis not present

## 2023-11-20 DIAGNOSIS — H25812 Combined forms of age-related cataract, left eye: Secondary | ICD-10-CM | POA: Diagnosis not present

## 2023-11-24 ENCOUNTER — Other Ambulatory Visit: Payer: Self-pay | Admitting: Family Medicine

## 2023-11-26 NOTE — Telephone Encounter (Signed)
 Patient has been scheduled

## 2023-11-26 NOTE — Telephone Encounter (Signed)
 Please schedule Medicare Wellness with Steward Drone and CPE with fasting labs prior with Dr. Patsy Lager.

## 2023-11-27 ENCOUNTER — Telehealth: Payer: Self-pay | Admitting: *Deleted

## 2023-11-27 DIAGNOSIS — E785 Hyperlipidemia, unspecified: Secondary | ICD-10-CM

## 2023-11-27 DIAGNOSIS — R739 Hyperglycemia, unspecified: Secondary | ICD-10-CM

## 2023-11-27 DIAGNOSIS — E559 Vitamin D deficiency, unspecified: Secondary | ICD-10-CM

## 2023-11-27 DIAGNOSIS — Z79899 Other long term (current) drug therapy: Secondary | ICD-10-CM

## 2023-11-27 NOTE — Telephone Encounter (Signed)
-----   Message from Alvina Chou sent at 11/26/2023  3:42 PM EDT ----- Regarding: Lab orders for TUE, 3.18.25 Patient is scheduled for CPX labs, please order future labs, Thanks , Camelia Eng

## 2023-12-06 ENCOUNTER — Other Ambulatory Visit (INDEPENDENT_AMBULATORY_CARE_PROVIDER_SITE_OTHER)

## 2023-12-06 DIAGNOSIS — E559 Vitamin D deficiency, unspecified: Secondary | ICD-10-CM

## 2023-12-06 DIAGNOSIS — Z79899 Other long term (current) drug therapy: Secondary | ICD-10-CM | POA: Diagnosis not present

## 2023-12-06 DIAGNOSIS — R739 Hyperglycemia, unspecified: Secondary | ICD-10-CM

## 2023-12-06 DIAGNOSIS — E785 Hyperlipidemia, unspecified: Secondary | ICD-10-CM | POA: Diagnosis not present

## 2023-12-06 LAB — BASIC METABOLIC PANEL WITH GFR
BUN: 14 mg/dL (ref 6–23)
CO2: 34 meq/L — ABNORMAL HIGH (ref 19–32)
Calcium: 9.5 mg/dL (ref 8.4–10.5)
Chloride: 99 meq/L (ref 96–112)
Creatinine, Ser: 0.69 mg/dL (ref 0.40–1.20)
GFR: 86.69 mL/min (ref >60.00)
Glucose, Bld: 101 mg/dL — ABNORMAL HIGH (ref 70–99)
Potassium: 4.3 meq/L (ref 3.5–5.1)
Sodium: 141 meq/L (ref 135–145)

## 2023-12-06 LAB — CBC WITH DIFFERENTIAL/PLATELET
Basophils Absolute: 0 10*3/uL (ref 0.0–0.1)
Basophils Relative: 0.5 % (ref 0.0–3.0)
Eosinophils Absolute: 0.2 10*3/uL (ref 0.0–0.7)
Eosinophils Relative: 3.3 % (ref 0.0–5.0)
HCT: 47.9 % — ABNORMAL HIGH (ref 36.0–46.0)
Hemoglobin: 15.7 g/dL — ABNORMAL HIGH (ref 12.0–15.0)
Lymphocytes Relative: 33.7 % (ref 12.0–46.0)
Lymphs Abs: 2.4 10*3/uL (ref 0.7–4.0)
MCHC: 32.7 g/dL (ref 30.0–36.0)
MCV: 92.8 fl (ref 78.0–100.0)
Monocytes Absolute: 0.7 10*3/uL (ref 0.1–1.0)
Monocytes Relative: 9.7 % (ref 3.0–12.0)
Neutro Abs: 3.7 10*3/uL (ref 1.4–7.7)
Neutrophils Relative %: 52.8 % (ref 43.0–77.0)
Platelets: 266 10*3/uL (ref 150.0–400.0)
RBC: 5.16 Mil/uL — ABNORMAL HIGH (ref 3.87–5.11)
RDW: 14.4 % (ref 11.5–15.5)
WBC: 7.1 10*3/uL (ref 4.0–10.5)

## 2023-12-06 LAB — LIPID PANEL
Cholesterol: 161 mg/dL (ref 0–200)
HDL: 44.2 mg/dL (ref >39.00)
LDL Cholesterol: 68 mg/dL (ref 0–99)
NonHDL: 116.6
Total CHOL/HDL Ratio: 4
Triglycerides: 241 mg/dL — ABNORMAL HIGH (ref 0.0–149.0)
VLDL: 48.2 mg/dL — ABNORMAL HIGH (ref 0.0–40.0)

## 2023-12-06 LAB — HEMOGLOBIN A1C: Hgb A1c MFr Bld: 5.8 % (ref 4.6–6.5)

## 2023-12-06 LAB — HEPATIC FUNCTION PANEL
ALT: 21 U/L (ref 0–35)
AST: 16 U/L (ref 0–37)
Albumin: 4.6 g/dL (ref 3.5–5.2)
Alkaline Phosphatase: 45 U/L (ref 39–117)
Bilirubin, Direct: 0.1 mg/dL (ref 0.0–0.3)
Total Bilirubin: 0.4 mg/dL (ref 0.2–1.2)
Total Protein: 7 g/dL (ref 6.0–8.3)

## 2023-12-07 LAB — TSH: TSH: 3.7 u[IU]/mL (ref 0.35–5.50)

## 2023-12-07 LAB — VITAMIN D 25 HYDROXY (VIT D DEFICIENCY, FRACTURES): VITD: 39.76 ng/mL (ref 30.00–100.00)

## 2023-12-11 ENCOUNTER — Encounter: Payer: Self-pay | Admitting: Family Medicine

## 2023-12-11 NOTE — Progress Notes (Unsigned)
 Syniah Berne T. Jacee Enerson, MD, CAQ Sports Medicine [facility] [address] [place], [address]  Phone: [phone]  FAX: [phone]  QUINTASHA [name] - 73 y.o. female  MRN [id number]  Date of Birth: May 29, 1951  Date: 12/13/2023  PCP: Hannah Beat, MD  Referral: Hannah Beat, MD  No chief complaint on file.  Patient Care Team: Hannah Beat, MD as PCP - General End, Cristal Deer, MD as PCP - Cardiology (Cardiology) Lanier Prude, MD as PCP - Electrophysiology (Cardiology) Rennis Golden Lisette Abu, MD as Consulting Physician (Cardiology) Subjective:   [name] [name] is a 73 y.o. pleasant patient who presents for a medicare wellness examination:  Health Maintenance Summary Reviewed and updated, unless pt declines services.  Tobacco History Reviewed. Non-smoker Alcohol: No concerns, no excessive use Exercise Habits: Some activity, rec at least 30 mins 5 times a week STD concerns: none Drug Use: None Birth control method: n/a Menses regular: n/a Lumps or breast concerns: no Breast Cancer Family History: personal history  [name] [name] is a very well-known patient, who I have known for many years.  Health Maintenance  Topic Date Due   COVID-19 Vaccine (4 - 2024-25 season) 05/13/2023   Medicare Annual Wellness (AWV)  11/20/2023   INFLUENZA VACCINE  04/11/2024   MAMMOGRAM  05/16/2025   DTaP/Tdap/Td (3 - Td or Tdap) 01/08/2033   Colonoscopy  07/23/2033   Pneumonia Vaccine 69+ Years old  Completed   DEXA SCAN  Completed   Hepatitis C Screening  Completed   Zoster Vaccines- Shingrix  Completed   HPV VACCINES  Aged Out   Immunization History  Administered Date(s) Administered   Fluad Quad(high Dose 65+) 08/19/2019, 07/17/2020, 05/22/2022   Influenza Split 07/10/2011   Influenza Whole 08/11/2008, 10/11/2009, 08/31/2010   Influenza, High Dose Seasonal PF 07/30/2018   Influenza,inj,Quad PF,6+ Mos 05/21/2013, 06/23/2014, 07/12/2015,  12/06/2017   PFIZER(Purple Top)SARS-COV-2 Vaccination 01/05/2020, 01/27/2020   Pfizer Covid-19 Vaccine Bivalent Booster 61yrs & up 01/05/2022   Pneumococcal Conjugate-13 12/06/2017   Pneumococcal Polysaccharide-23 08/19/2019   Td 08/31/2010   Tdap 01/09/2023   Zoster Recombinant(Shingrix) 02/02/2023    Patient Active Problem List   Diagnosis Date Noted   Paroxysmal atrial fibrillation (HCC) 06/19/2023    Priority: High   Chronic HFrEF (heart failure with reduced ejection fraction) (HCC) 05/03/2020    Priority: High   Ischemic cardiomyopathy 03/10/2020    Priority: High   Coronary artery disease involving native coronary artery of native heart without angina pectoris     Priority: High   DCIS (ductal carcinoma in situ), LEFT, remission 09/28/2008    Priority: High   Thoracic aortic aneurysm (HCC) 07/16/2020    Priority: Medium    Depression 11/28/2019    Priority: Medium    Hyperlipidemia LDL goal <70 09/28/2008    Priority: Medium    Essential hypertension 09/28/2008    Priority: Medium    Allergic rhinitis 09/28/2008    Priority: Low    Past Medical History:  Diagnosis Date   Allergic rhinitis 09/28/2008   Breast cancer (HCC) 2004   Left Breast Cancer   CHF (congestive heart failure) (HCC) 11/28/2019   Coronary artery disease    a. 10/2012 ETT: nl; b. 12/2012 Cath: LM nl, LAD min irregs, D1 min irregs, LCX 30p, RCA 56m, RPDA 40p, EF 50%.   Diastolic dysfunction    a. 12/2012 Echo: EF 50-55%. No rwma. Gr1 DD. Nl RV fxn. Nl PASP.   HLD (hyperlipidemia) 09/28/2008  HTN (hypertension) 06/27/2011   Ischemic cardiomyopathy 12/02/2019   Osteopenia 11/16/2008   Paroxysmal atrial fibrillation (HCC) 06/19/2023    Past Surgical History:  Procedure Laterality Date   ABDOMINAL HYSTERECTOMY  1985   endometriosis; anatomy   BREAST SURGERY  10/2002   CARDIAC CATHETERIZATION  12/19/2012   [facility]; EF 50%   COLONOSCOPY  04/2008   Hyperplastic polyp; repeat in 10 years    COLONOSCOPY WITH PROPOFOL Bilateral 07/24/2023   Procedure: COLONOSCOPY WITH PROPOFOL;  Surgeon: Willis Modena, MD;  Location: [facility];  Service: Gastroenterology;  Laterality: Bilateral;   CORONARY STENT INTERVENTION N/A 05/03/2020   Procedure: CORONARY STENT INTERVENTION;  Surgeon: Yvonne Kendall, MD;  Location: [facility] INVASIVE CV LAB;  Service: Cardiovascular;  Laterality: N/A;   CORONARY ULTRASOUND/IVUS N/A 05/03/2020   Procedure: Intravascular Ultrasound/IVUS;  Surgeon: Yvonne Kendall, MD;  Location: [facility] INVAS[facility] CV LAB;  Service: Cardiovascular;  Laterality: N/A;   HIP FRACTURE SURGERY  06/14/2011   ball replaced (Dr. Marciano Sequin)   ICD IMPLANT N/A 03/31/2021   Procedure: ICD IMPLANT;  Surgeon: Lanier Prude, MD;  Location: [facility] INVASIVE CV LAB;  Service: Cardiovascular;  Laterality: N/A;   LEFT HEART CATH N/A 05/03/2020   Procedure: Left Heart Cath;  Surgeon: Yvonne Kendall, MD;  Location: [facility];  Service: Cardiovascular;  Laterality: N/A;   MASTECTOMY Left 2004   MOLE REMOVAL     POLYPECTOMY  07/24/2023   Procedure: POLYPECTOMY;  Surgeon: Willis Modena, MD;  Location: [facility];  Service: Gastroenterology;;   RIGHT/LEFT HEART CATH AND CORONARY ANGIOGRAPHY N/A 12/02/2019   Procedure: RIGHT/LEFT HEART CATH AND CORONARY ANGIOGRAPHY;  Surgeon: Yvonne Kendall, MD;  Location: [facility] INVASIVE CV LAB;  Service: Cardiovascular;  Laterality: N/A;   TONSILLECTOMY  1958    Family History  Problem Relation Age of Onset   Heart attack [name] [name] 53   Heart disease [name] [name]    Sarcoidosis [name] [name]    Ovarian cancer Other        Aunt   Diabetes Other        GP   Breast cancer [name] [name] 46   Cancer [name] [name]        breast   Cancer Paternal Aunt        colon/ovarian    Social History   Social History Narrative   Widowed to husband [name] [name]      No regular exercise   Lives locally w/ tenants that help her w/ chores around the house.    Past Medical History,  Surgical History, Social History, Family History, Problem List, Medications, and Allergies have been reviewed and updated if relevant.  Review of Systems: Pertinent positives are listed above.  Otherwise, a full 14 point review of systems has been done in full and it is negative except where it is noted positive.  Objective:   There were no vitals taken for this visit.    07/17/2020    9:00 AM 10/11/2020   11:22 AM 03/08/2021   12:15 PM 11/10/2021    9:47 AM 11/20/2022    9:11 AM  Fall Risk  Falls in the past year?  0 1 0 0  Was there an injury with Fall?  0 0 0 0  Fall Risk Category Calculator  0 1 0 0  Fall Risk Category (Retired)  Low Low Low   (RETIRED) Patient Fall Risk Level Low fall risk Low fall risk Low fall risk Low fall risk   Patient at Risk for Falls Due  to   History of fall(s) No Fall Risks No Fall Risks  Fall risk Follow up  Falls evaluation completed Falls evaluation completed;Education provided Falls prevention discussed Falls evaluation completed   Ideal Body Weight:   No results found.    11/20/2022    9:11 AM 11/10/2021    9:48 AM 03/08/2021   12:09 PM 08/02/2020   11:45 AM 06/29/2020    3:05 PM  Depression screen PHQ 2/9  Decreased Interest 0 0 0 0 1  Down, Depressed, Hopeless 0 0 0 0 1  PHQ - 2 Score 0 0 0 0 2  Altered sleeping    0 0  Tired, decreased energy    1 3  Change in appetite    1 1  Feeling bad or failure about yourself     0 1  Trouble concentrating    1 0  Moving slowly or fidgety/restless    0 0  Suicidal thoughts    0 0  PHQ-9 Score    3 7  Difficult doing work/chores    Somewhat difficult      GEN: well developed, well nourished, no acute distress Eyes: conjunctiva and lids normal, PERRLA, EOMI ENT: TM clear, nares clear, oral exam WNL Neck: supple, no lymphadenopathy, no thyromegaly, no JVD Pulm: clear to auscultation and percussion, respiratory effort normal CV: regular rate and rhythm, S1-S2, no murmur, rub or gallop, no  bruits Chest: no scars, masses, no lumps BREAST: breast exam declined GI: soft, non-tender; no hepatosplenomegaly, masses; active bowel sounds all quadrants GU: GU exam declined Lymph: no cervical, axillary or inguinal adenopathy MSK: gait normal, muscle tone and strength WNL, no joint swelling, effusions, discoloration, crepitus  SKIN: clear, good turgor, color WNL, no rashes, lesions, or ulcerations Neuro: normal mental status, normal strength, sensation, and motion Psych: alert; oriented to person, place and time, normally interactive and not anxious or depressed in appearance.   All labs reviewed with patient.  Lipids: Lab Results  Component Value Date   CHOL 161 12/06/2023   Lab Results  Component Value Date   HDL 44.20 12/06/2023   Lab Results  Component Value Date   LDLCALC 68 12/06/2023   Lab Results  Component Value Date   TRIG 241.0 (H) 12/06/2023   Lab Results  Component Value Date   CHOLHDL 4 12/06/2023   CBC:    Latest Ref Rng & Units 12/06/2023    8:09 AM 11/20/2022    9:35 AM 09/29/2021   11:53 AM  CBC  WBC 4.0 - 10.5 K/uL 7.1  8.2  9.8   Hemoglobin 12.0 - 15.0 g/dL 29.5  28.4  13.2   Hematocrit 36.0 - 46.0 % 47.9  49.9  47.9   Platelets 150.0 - 400.0 K/uL 266.0  307.0  350     Basic Metabolic Panel:    Component Value Date/Time   NA 141 12/06/2023 0809   NA 143 12/22/2021 0900   K 4.3 12/06/2023 0809   CL 99 12/06/2023 0809   CO2 34 (H) 12/06/2023 0809   BUN 14 12/06/2023 0809   BUN 11 12/22/2021 0900   CREATININE 0.69 12/06/2023 0809   GLUCOSE 101 (H) 12/06/2023 0809   CALCIUM 9.5 12/06/2023 0809      Latest Ref Rng & Units 12/06/2023    8:09 AM 11/20/2022    9:35 AM 03/31/2022    9:37 AM  Hepatic Function  Total Protein 6.0 - 8.3 g/dL 7.0  7.5  7.4   Albumin  3.5 - 5.2 g/dL 4.6  4.3  4.2   AST 0 - 37 U/L 16  30  19    ALT 0 - 35 U/L 21  32  22   Alk Phosphatase 39 - 117 U/L 45  53  54   Total Bilirubin 0.2 - 1.2 mg/dL 0.4  0.4  0.4    Bilirubin, Direct 0.0 - 0.3 mg/dL 0.1  0.1      Lab Results  Component Value Date   HGBA1C 5.8 12/06/2023   Lab Results  Component Value Date   TSH 3.70 12/06/2023    No results found.  Assessment and Plan:     ICD-10-CM   1. Healthcare maintenance  Z00.00       Health Maintenance Exam: The patient's preventative maintenance and recommended screening tests for an annual wellness exam were reviewed in full today. Brought up to date unless services declined.  Counselled on the importance of diet, exercise, and its role in overall health and mortality. The patient's FH and SH was reviewed, including their home life, tobacco status, and drug and alcohol status.  Follow-up in 1 year for physical exam or additional follow-up below.  I have personally reviewed the Medicare Annual Wellness questionnaire and have noted 1. The patient's medical and social history 2. Their use of alcohol, tobacco or illicit drugs 3. Their current medications and supplements 4. The patient's functional ability including ADL's, fall risks, home safety risks and hearing or visual             impairment. 5. Diet and physical activities 6. Evidence for depression or mood disorders 7. Reviewed Updated provider list, see scanned forms and [facility] Snapshot.  8. Reviewed whether or not the patient has HCPOA or living will, and discussed what this means with the patient.  Recommended [name] [name] bring in a copy for his chart in [facility].  The patients weight, height, BMI and visual acuity have been recorded in the chart I have made referrals, counseling and provided education to the patient based review of the above and I have provided the pt with a written personalized care plan for preventive services.  I have provided the patient with a copy of your personalized plan for preventive services. Instructed to take the time to review along with their updated medication list.  Disposition: No follow-ups on file.  Future  Appointments  Date Time Provider Department Center  12/13/2023  9:40 AM Hannah Beat, MD [facility]  12/27/2023  7:00 AM [facility] DEVICE REMOTES [facility]  01/09/2024 10:30 AM Sondra Barges, PA-C [facility] None  03/27/2024  7:00 AM [facility] DEVICE REMOTES [facility]  06/26/2024  7:00 AM [facility] DEVICE REMOTES [facility]  09/25/2024  7:00 AM [facility] DEVICE REMOTES [facility]  12/25/2024  7:00 AM [facility] DEVICE REMOTES [facility]  03/26/2025  7:00 AM [facility] DEVICE REMOTES [facility]    No orders of the defined types were placed in this encounter.  There are no discontinued medications. No orders of the defined types were placed in this encounter.   Signed,  Elpidio Galea. Serra Younan, MD   Allergies as of 12/13/2023       Reactions   Codeine Other (See Comments)   Reation:  Hallucinations    Elemental Sulfur Rash   Morphine Other (See Comments)   Reaction:  Hallucinations    Ace Inhibitors Cough   Sulfonamide Derivatives Rash        Medication List  Accurate as of December 11, 2023  2:32 PM. If you have any questions, ask your nurse or doctor.          acetaminophen 650 MG CR tablet Commonly known as: TYLENOL Take 1,300 mg by mouth every 8 (eight) hours as needed for pain.   alendronate 70 MG tablet Commonly known as: FOSAMAX TAKE 1 TABLET (70 MG TOTAL) BY MOUTH EVERY 7 DAYS WITH FULL GLASS WATER ON EMPTY STOMACH   apixaban 5 MG Tabs tablet Commonly known as: Eliquis Take 1 tablet (5 mg total) by mouth 2 (two) times daily.   bismuth subsalicylate 262 MG/15ML suspension Commonly known as: PEPTO BISMOL Take 30 mLs by mouth every 6 (six) hours as needed for indigestion or diarrhea or loose stools.   Calcium Carbonate-Vitamin D 600-400 MG-UNIT tablet Take 1 tablet by mouth daily.   carvedilol 25 MG tablet Commonly known as: COREG Take 1 tablet (25 mg total) by  mouth 2 (two) times daily with a meal.   cetirizine 10 MG tablet Commonly known as: ZYRTEC Take 10 mg by mouth daily as needed for allergies.   dapagliflozin propanediol 10 MG Tabs tablet Commonly known as: Farxiga Take 1 tablet (10 mg total) by mouth daily.   Dry Eye Relief Drops 0.2-0.2-1 % Soln Generic drug: Glycerin-Hypromellose-PEG 400 Place 1 drop into both eyes daily as needed (Dry eye).   Entresto 97-103 MG Generic drug: sacubitril-valsartan TAKE 1 TABLET BY MOUTH TWICE A DAY   Eye Vitamins Caps Take 1 capsule by mouth daily. Vision Shield   FLUoxetine 20 MG capsule Commonly known as: PROZAC Take 1 capsule by mouth once daily   fluticasone 50 MCG/ACT nasal spray Commonly known as: FLONASE Place 1 spray into both nostrils daily as needed for allergies.   furosemide 40 MG tablet Commonly known as: LASIX TAKE 1/2 TABLET BY MOUTH DAILY   Grape Seed Extract 60 MG Caps Take 60 mg by mouth daily at 2 PM.   multivitamin with minerals Tabs tablet Take 1 tablet by mouth daily.   nystatin cream Commonly known as: MYCOSTATIN Apply topically as needed.   Osteo Bi-Flex Adv Joint Shield Tabs Take 1 tablet by mouth 2 (two) times daily.   pseudoephedrine-acetaminophen 30-500 MG Tabs tablet Commonly known as: TYLENOL SINUS Take 2 tablets by mouth every 4 (four) hours as needed (Sinus).   rosuvastatin 40 MG tablet Commonly known as: CRESTOR TAKE 1 TABLET BY MOUTH EVERY DAY   spironolactone 25 MG tablet Commonly known as: ALDACTONE Take 0.5 tablets (12.5 mg total) by mouth daily.

## 2023-12-13 ENCOUNTER — Encounter: Payer: Self-pay | Admitting: Family Medicine

## 2023-12-13 ENCOUNTER — Ambulatory Visit (INDEPENDENT_AMBULATORY_CARE_PROVIDER_SITE_OTHER): Admitting: Family Medicine

## 2023-12-13 VITALS — BP 100/70 | HR 74 | Temp 98.1°F | Ht 64.25 in | Wt 209.1 lb

## 2023-12-13 DIAGNOSIS — I712 Thoracic aortic aneurysm, without rupture, unspecified: Secondary | ICD-10-CM

## 2023-12-13 DIAGNOSIS — Z0000 Encounter for general adult medical examination without abnormal findings: Secondary | ICD-10-CM | POA: Diagnosis not present

## 2023-12-13 DIAGNOSIS — I5022 Chronic systolic (congestive) heart failure: Secondary | ICD-10-CM

## 2023-12-15 ENCOUNTER — Other Ambulatory Visit: Payer: Self-pay | Admitting: Family Medicine

## 2023-12-19 ENCOUNTER — Ambulatory Visit
Admission: RE | Admit: 2023-12-19 | Discharge: 2023-12-19 | Disposition: A | Discharge status: Home / Self Care | Source: Ambulatory Visit | Attending: Family Medicine | Admitting: Family Medicine

## 2023-12-19 DIAGNOSIS — I712 Thoracic aortic aneurysm, without rupture, unspecified: Secondary | ICD-10-CM

## 2023-12-19 MED ORDER — IOPAMIDOL (ISOVUE-370) INJECTION 76%
75.0000 mL | Freq: Once | INTRAVENOUS | Status: AC | PRN
  Administered 2023-12-19: 75 mL via INTRAVENOUS

## 2023-12-20 ENCOUNTER — Encounter: Payer: Self-pay | Admitting: Family Medicine

## 2023-12-24 ENCOUNTER — Other Ambulatory Visit: Payer: Self-pay | Admitting: Internal Medicine

## 2023-12-24 DIAGNOSIS — I4891 Unspecified atrial fibrillation: Secondary | ICD-10-CM

## 2023-12-24 NOTE — Telephone Encounter (Signed)
 Prescription refill request for Eliquis received. Indication:afib Last office visit:10/24 Scr:0.69  3/25 Age: 73 Weight:94.9  kg  Prescription refilled

## 2023-12-24 NOTE — Telephone Encounter (Signed)
 Refill request

## 2023-12-27 ENCOUNTER — Ambulatory Visit: Payer: Medicare Other

## 2024-01-07 NOTE — Progress Notes (Unsigned)
 Cardiology Office Note    Date:  01/09/2024   ID:  [name] [name], DOB 02/23/1951, MRN [id number]  PCP:  Scherrie Curt, MD  Cardiologist:  Sammy Crisp, MD  Electrophysiologist:  Boyce Byes, MD   Chief Complaint: Follow-up  History of Present Illness:   [name] [name] is a 73 y.o. 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   Prior LHC in 12/2012 showed nonobstructive disease as outlined below.  Echo at that time showed an EF of 50 to 55%, no regional wall motion abnormalities, grade 1 diastolic dysfunction, normal RV systolic function and PASP.  She was admitted to the hospital in 11/2019 with exertional dyspnea.  High-sensitivity troponin peaked at 415.  Echo demonstrated a new cardiomyopathy with an EF of 25 to 30%, global hypokinesis, mildly dilated LV internal cavity size, mild LVH, grade 1 diastolic dysfunction, normal RV systolic function and ventricular cavity size, mildly dilated left atrium, and an estimated right atrial pressure of 3 mmHg.  R/LHC at that time showed significant two-vessel CAD with diffuse LAD stenosis up to 70 to 80% in the mid/distal vessel and CTO of the mid RCA with left-to-right collaterals.  Moderately elevated left heart, right heart, and pulmonary artery pressures.  Low normal cardiac output/index.  Medical management was advised given small vessel size and diffuse disease involving the LAD and RCA making PCI and surgical revascularization suboptimal.  Repeat echo on GDMT in 02/2020 showed a persistent cardiomyopathy with an EF of 25 to 30%, global hypokinesis, mildly dilated LV internal cavity size, grade 2 diastolic dysfunction, normal RV  systolic function and ventricular cavity size, mildly dilated left atrium, mild mitral regurgitation, and mild to moderate aortic valve sclerosis without evidence of stenosis.  She subsequently underwent repeat LHC in 04/2020 which showed severe two-vessel CAD with diffuse LAD stenosis of up to 70 to 80% that was significant by IVUS, as well as CTO of the RCA.  She underwent successful IVUS guided PCI to the mid/distal LAD using overlapping DES.     She was admitted to the hospital in 07/2020 with acute on chronic combined systolic and diastolic CHF and a large left pleural effusion.  She underwent thoracentesis with 1.4 L of fluid removed with cytology being negative for malignancy.  Echo in 07/2020, again showed a persistent cardiomyopathy with an EF of 25 to 30%, global hypokinesis with severe hypokinesis of the anteroseptal wall and basal anterior wall, mildly dilated LV internal cavity size, normal RV systolic function and ventricular cavity size.   She underwent repeat echo on 02/01/2021, on maximally tolerated GDMT, which showed a slight improvement in her LVSF with an EF of 30 to 35%, global hypokinesis, moderately to severely dilated LV internal cavity size, grade 1 diastolic dysfunction, trivial pericardial effusion, and akinesis of the basal mid inferior wall.  She was evaluated by EP on 03/23/2021 with recommendation to proceed with ICD for primary prevention.  She subsequently underwent implantation of Biotronik ICD on 03/31/2021.   Device interrogation in 07/2022 showed new onset A-fib.  She was subsequently evaluated by the A-fib clinic and initiated on apixaban  with discontinuation of clopidogrel  to minimize bleeding risk.   She was seen in the office on 01/03/2023  and underwent echo to reevaluate her LV systolic function in 01/2023 that showed an EF of 30 to 35%, global hypokinesis with severe hypokinesis of the anterior and anteroseptal wall, moderately to severely dilated LV internal cavity  size, grade 1 diastolic dysfunction, normal RV systolic function and ventricular cavity size, mild mitral regurgitation, tricuspid aortic valve, and borderline dilatation of the ascending aorta measuring 39 mm.   She was last seen in the office in 06/2023 for preprocedure cardiac risk stratification for colonoscopy and was doing well from a cardiac perspective.  Carvedilol  was titrated to 25 mg twice daily with continuation of Entresto  97/103 mg twice daily, Farxiga  10 mg, spironolactone  12.5 mg, and furosemide .  CTA chest/aorta on 12/19/2023 showed aneurysmal ascending thoracic aorta measuring 4.2 cm along with right main pulmonary artery measuring 3.4 cm and cardiomegaly.  She comes in continuing to do well from a cardiac perspective and is without symptoms of angina or cardiac decompensation.  No dyspnea, palpitations, presyncope, or syncope.  No lower extremity swelling or progressive orthopnea.  No falls or symptoms concerning for bleeding.  She would like to be more active outdoors moving forward.  Adherent and tolerating cardiac pharmacotherapy without off target effect.   Labs independently reviewed: 11/2023 - TC 161, TG 241, HDL 44, LDL 68, A1c 5.8, Hgb 15.7, PLT 266, TSH normal, potassium 4.3, BUN 14, serum creatinine 0.69, albumin 4.6, AST/ALT normal  Past Medical History:  Diagnosis Date   Allergic rhinitis 09/28/2008   Breast cancer (HCC) 2004   Left Breast Cancer   CHF (congestive heart failure) (HCC) 11/28/2019   Coronary artery disease    a. 10/2012 ETT: nl; b. 12/2012 Cath: LM nl, LAD min irregs, D1 min irregs, LCX 30p, RCA 20m, RPDA 40p, EF 50%.   Diastolic dysfunction    a. 12/2012 Echo: EF 50-55%. No rwma. Gr1 DD. Nl RV fxn. Nl PASP.   HLD (hyperlipidemia) 09/28/2008   HTN (hypertension) 06/27/2011   Ischemic cardiomyopathy 12/02/2019   Osteopenia 11/16/2008   Paroxysmal atrial fibrillation (HCC) 06/19/2023    Past Surgical History:  Procedure Laterality Date   ABDOMINAL  HYSTERECTOMY  1985   endometriosis; anatomy   BREAST SURGERY  10/2002   CARDIAC CATHETERIZATION  12/19/2012   [facility]; EF 50%   COLONOSCOPY  04/2008   Hyperplastic polyp; repeat in 10 years   COLONOSCOPY WITH PROPOFOL  Bilateral 07/24/2023   Procedure: COLONOSCOPY WITH PROPOFOL ;  Surgeon: Evangeline Hilts, MD;  Location: [facility];  Service: Gastroenterology;  Laterality: Bilateral;   CORONARY STENT INTERVENTION N/A 05/03/2020   Procedure: CORONARY STENT INTERVENTION;  Surgeon: Sammy Crisp, MD;  Location: [facility] IN[facility] CV [facility];  Service: Cardiovascular;  Laterality: N/A;   CORONARY ULTRASOUND/IVUS N/A 05/03/2020   Procedure: Intravascular Ultrasound/IVUS;  Surgeon: Sammy Crisp, MD;  Location: [facility] CV [facility];  Service: Cardiovascular;  Laterality: N/A;   HIP FRACTURE SURGERY  06/14/2011   ball replaced (Dr. Joya Nissen)   ICD IMPLANT N/A 03/31/2021   Procedure: ICD IMPLANT;  Surgeon: Boyce Byes, MD;  Location: [facility] INVASIVE CV LAB;  Service: Cardiovascular;  Laterality: N/A;   LEFT HEART CATH N/A 05/03/2020   Procedure: Left Heart Cath;  Surgeon: Sammy Crisp, MD;  Location: [facility] CV [facility];  Service: Cardiovascular;  Laterality: N/A;   MASTECTOMY Left 2004   MOLE REMOVAL     POLYPECTOMY  07/24/2023   Procedure: POLYPECTOMY;  Surgeon: Evangeline Hilts, MD;  Location: [facility];  Service: Gastroenterology;;   RIGHT/LEFT HEART CATH AND CORONARY ANGIOGRAPHY N/A  12/02/2019   Procedure: RIGHT/LEFT HEART CATH AND CORONARY ANGIOGRAPHY;  Surgeon: Sammy Crisp, MD;  Location: [facility] INVASIVE CV LAB;  Service: Cardiovascular;  Laterality: N/A;   TONSILLECTOMY  1958    Current Medications: Current Meds  Medication Sig   acetaminophen  (TYLENOL ) 650 MG CR tablet Take 1,300 mg by mouth every 8 (eight) hours as needed for pain.   alendronate  (FOSAMAX ) 70 MG tablet TAKE 1 TABLET (70 MG TOTAL) BY MOUTH EVERY 7 DAYS WITH FULL GLASS WATER ON EMPTY STOMACH   bismuth subsalicylate  (PEPTO BISMOL) 262 MG/15ML suspension Take 30 mLs by mouth every 6 (six) hours as needed for indigestion or diarrhea or loose stools.   Calcium  Carbonate-Vitamin D  600-400 MG-UNIT tablet Take 1 tablet by mouth daily.    carvedilol  (COREG ) 25 MG tablet Take 1 tablet (25 mg total) by mouth 2 (two) times daily with a meal.   cetirizine (ZYRTEC) 10 MG tablet Take 10 mg by mouth daily as needed for allergies.   dapagliflozin  propanediol (FARXIGA ) 10 MG TABS tablet Take 1 tablet (10 mg total) by mouth daily.   FLUoxetine  (PROZAC ) 20 MG capsule Take 1 capsule by mouth once daily   fluticasone  (FLONASE ) 50 MCG/ACT nasal spray Place 1 spray into both nostrils daily as needed for allergies.   furosemide  (LASIX ) 40 MG tablet TAKE 1/2 TABLET BY MOUTH DAILY   Glycerin-Hypromellose-PEG 400 (DRY EYE RELIEF DROPS) 0.2-0.2-1 % SOLN Place 1 drop into both eyes daily as needed (Dry eye).   Grape Seed Extract 60 MG CAPS Take 60 mg by mouth daily at 2 PM.   Misc Natural Products (OSTEO BI-FLEX ADV JOINT SHIELD) TABS Take 1 tablet by mouth 2 (two) times daily.   Multiple Vitamin (MULTIVITAMIN WITH MINERALS) TABS tablet Take 1 tablet by mouth daily.   Multiple Vitamins-Minerals (EYE VITAMINS) CAPS Take 1 capsule by mouth daily. Vision Shield   nystatin  cream (MYCOSTATIN ) Apply topically as needed.   pseudoephedrine-acetaminophen  (TYLENOL  SINUS) 30-500 MG TABS tablet Take 2 tablets by mouth every 4 (four) hours as needed (Sinus).   rosuvastatin  (CRESTOR ) 40 MG tablet TAKE 1 TABLET BY MOUTH EVERY DAY   sacubitril -valsartan  (ENTRESTO ) 97-103 MG TAKE 1 TABLET BY MOUTH TWICE A DAY   spironolactone  (ALDACTONE ) 25 MG tablet Take 0.5 tablets (12.5 mg total) by mouth daily.   [DISCONTINUED] ELIQUIS  5 MG TABS tablet TAKE 1 TABLET BY MOUTH TWICE A DAY    Allergies:   Codeine, Elemental sulfur, Morphine, Ace inhibitors, and Sulfonamide derivatives   Social History   Socioeconomic History   Marital status: Widowed    Spouse  name: (Widow) to Siegfried Dress   Number of children: Not on file   Years of education: Not on file   Highest education level: Not on file  Occupational History   Not on file  Tobacco Use   Smoking status: Former    Current packs/day: 0.00    Average packs/day: 0.5 packs/day for 10.0 years (5.0 ttl pk-yrs)    Types: Cigarettes    Start date: 85    Quit date: 31    Years since quitting: 38.3    Passive exposure: Past   Smokeless tobacco: Former    Quit date: 01/24/1986   Tobacco comments:    1989 quit   Vaping Use   Vaping status: Never Used  Substance and Sexual Activity   Alcohol use: No   Drug use: No   Sexual activity: Not on file  Other Topics Concern   Not on file  Social History Narrative   Widowed to husband Micaela Addison      No regular exercise   Lives locally w/ tenants that help her w/ chores around the house.   Social Drivers of Corporate investment banker Strain: Low Risk  (11/10/2021)   Overall Financial Resource Strain (CARDIA)    Difficulty of Paying Living Expenses: Not hard at all  Food Insecurity: No Food Insecurity (11/10/2021)   Hunger Vital Sign    Worried About Running Out of Food in the Last Year: Never true    Ran Out of Food in the Last Year: Never true  Transportation Needs: No Transportation Needs (11/10/2021)   PRAPARE - Administrator, Civil Service (Medical): No    Lack of Transportation (Non-Medical): No  Physical Activity: Sufficiently Active (11/10/2021)   Exercise Vital Sign    Days of Exercise per Week: 7 days    Minutes of Exercise per Session: 30 min  Stress: No Stress Concern Present (11/10/2021)   [employer] of Occupational Health - Occupational Stress Questionnaire    Feeling of Stress : Not at all  Social Connections: Moderately Integrated (11/10/2021)   Social Connection and Isolation Panel [NHANES]    Frequency of Communication with Friends and Family: Three times a week    Frequency of Social Gatherings with  Friends and Family: More than three times a week    Attends Religious Services: More than 4 times per year    Active Member of Clubs or Organizations: No    Attends Engineer, structural: More than 4 times per year    Marital Status: Widowed     Family History:  The patient's family history includes Breast cancer (age of onset: 53) in her sister; Cancer in her paternal aunt and sister; Diabetes in an other family member; Heart attack (age of onset: 47) in her father; Heart disease in her father; Ovarian cancer in an other family member; Sarcoidosis in her mother.  ROS:   12-point review of systems is negative unless otherwise noted in the HPI.   EKGs/Labs/Other Studies Reviewed:    Studies reviewed were summarized above. The additional studies were reviewed today:  2D echo 01/31/2023: 1. Left ventricular ejection fraction, by estimation, is 30 to 35%. The  left ventricle has moderately decreased function. The left ventricle  demonstrates global hypokinesis with severe hypokinesis of the anterior  and anteroseptal wall.   2. The left ventricular internal cavity size was moderately to severely  dilated. Left ventricular diastolic parameters are consistent with Grade I  diastolic dysfunction (impaired relaxation).   3. Right ventricular systolic function is normal. The right ventricular  size is normal. Tricuspid regurgitation signal is inadequate for assessing  PA pressure.   4. The mitral valve is normal in structure. Mild mitral valve  regurgitation. No evidence of mitral stenosis.   5. The aortic valve is tricuspid. Aortic valve regurgitation is not  visualized. No aortic stenosis is present.   6. There is borderline dilatation of the ascending aorta, measuring 39  mm.   7. The inferior vena cava is normal in size with greater than 50%  respiratory variability, suggesting right atrial pressure of 3 mmHg.   Comparison(s): 01/2021: 30-35% (with Definity ), Dil LV.   __________   2D echo 02/01/2021: 1. Left ventricular ejection fraction, by estimation, is 30 to 35%. The  left ventricle has moderately decreased function. The left ventricle  demonstrates global hypokinesis. The left ventricular internal  cavity size  was moderately to severely dilated.  Left ventricular diastolic parameters are consistent with Grade I  diastolic dysfunction (impaired relaxation). There is akinesis of the left  ventricular, basal-mid inferior wall.  __________   2D echo 07/16/2020: 1. Left ventricular ejection fraction, by estimation, is 25 to 30%. The  left ventricle has severely decreased function. The left ventricle  demonstrates global hypokinesis with severe hypokinesis of the  anterospetal wall and basal anterior wall. The left   ventricular internal cavity size was mildly dilated.   2. Right ventricular systolic function is normal. The right ventricular  size is normal. Tricuspid regurgitation signal is inadequate for assessing  PA pressure.  __________   LHC 05/03/2020: Conclusions: Severe two-vessel coronary artery disease with diffuse LAD disease of up to 70-80% that is significant by IVUS, as well as CTO of RCA. Upper normal LVEDP. Successful IVUS-guided PCI to mid/distal LAD using overlapping Resolute Onyx 2.5 x 38 mm and 2.25 x 34 mm drug-eluting stents with 0% residual stenosis and TIMI-3 flow.   Recommendations: Overnight extended recovery given multivessel CAD and LVEF < 35%. Obtain CXR and UA/urine culture for further evaluation of leukocytosis. Dual antiplatelet therapy with ASA and clopidogrel  for at lest 6 months, ideally longer. Recheck lipid panel in AM and escalate statin therapy, as tolerated, if LDL > 70. __________   Cardiac MRI 04/17/2020: 1. Severe left ventricular enlargement with severely reduced LV function, LVEF 33%. Akinesis of inferior and inferolateral walls. 2. Post-contrast delayed myocardial enhancement in the inferior  and inferolateral walls, with greater than 50% of the myocardial thickness involved suggesting no viability in the RCA territory. 3.  Normal right ventricular size and systolic function. __________   2D echo 03/01/2020: 1. Left ventricular ejection fraction, by estimation, is 25 to 30%. The  left ventricle has severely decreased function. The left ventricle  demonstrates global hypokinesis. The left ventricular internal cavity size  was mildly dilated. Left ventricular  diastolic parameters are consistent with Grade II diastolic dysfunction  (pseudonormalization).   2. Right ventricular systolic function is normal. The right ventricular  size is normal. Tricuspid regurgitation signal is inadequate for assessing  PA pressure.   3. Left atrial size was mildly dilated.   4. The mitral valve is normal in structure. Mild mitral valve  regurgitation. No evidence of mitral stenosis.   5. The aortic valve is normal in structure. Aortic valve regurgitation is  not visualized. Mild to moderate aortic valve sclerosis/calcification is  present, without any evidence of aortic stenosis.   Comparison(s): Previous Echo showed new finding of LV EF 25-30% with  severely decreased function, global HK, and grade I diastolic dysfunction.  __________   [facility] 12/02/2019: Conclusions: Significant two-vessel coronary artery disease with diffuse LAD disease of up to 70-80% in the mid/distal vessel and chronic subtotal occlusion of the mid RCA with left-to-right collaterals. Moderately elevated left heart, right heart, and pulmonary artery pressures. Low normal Fick cardiac output/index.   Recommendations: Escalate diuresis and optimize evidence-based heart failure therapy. Medical management of coronary artery disease.  Small vessel size and diffuse disease involving LAD and RCA make percutaneous and surgical revascularization suboptimal. __________   2D echo 11/29/2019: 1. Left ventricular ejection  fraction, by estimation, is 25 to 30%. The  left ventricle has severely decreased function. The left ventricle  demonstrates global hypokinesis. The left ventricular internal cavity size  was mildly dilated. There is mild left  ventricular hypertrophy. Left ventricular diastolic parameters are  consistent with  Grade I diastolic dysfunction (impaired relaxation).   2. Right ventricular systolic function is normal. The right ventricular  size is normal. Tricuspid regurgitation signal is inadequate for assessing  PA pressure.   3. Left atrial size was mildly dilated.   4. The inferior vena cava is normal in size with greater than 50%  respiratory variability, suggesting right atrial pressure of 3 mmHg. __________   [facility] 12/19/2012: Left main normal, LAD minor luminal irregularities, proximal LCx 30% stenosis, mid RCA 50% stenosis, proximal RPDA 40% stenosis, LVEF estimated 50%.  Medical therapy recommended. __________   2D echo 12/10/2012: - Left ventricle: The cavity size was normal. Systolic    function was normal. The estimated ejection fraction was    in the range of 50% to 55%. Wall motion was normal; there    were no regional wall motion abnormalities. Doppler    parameters are consistent with abnormal left ventricular    relaxation (grade 1 diastolic dysfunction).  - Left atrium: The atrium was normal in size.  - Right ventricle: Systolic function was normal.  - Pulmonary arteries: Systolic pressure was within the    normal range.    EKG:  EKG is ordered today.  The EKG ordered today demonstrates NSR, 72 bpm, inferior TWI consistent with prior tracing, prior anterior MI vs lead placement, no acute st/t changes  Recent Labs: 12/06/2023: ALT 21; BUN 14; Creatinine, Ser 0.69; Hemoglobin 15.7; Platelets 266.0; Potassium 4.3; Sodium 141; TSH 3.70  Recent Lipid Panel    Component Value Date/Time   CHOL 161 12/06/2023 0809   CHOL 154 12/22/2021 0900   TRIG 241.0 (H) 12/06/2023 0809    HDL 44.20 12/06/2023 0809   HDL 36 (L) 12/22/2021 0900   CHOLHDL 4 12/06/2023 0809   VLDL 48.2 (H) 12/06/2023 0809   LDLCALC 68 12/06/2023 0809   LDLCALC 74 12/22/2021 0900   LDLDIRECT 63.9 10/06/2021 1147    PHYSICAL EXAM:    VS:  BP 120/70 (BP Location: Right Arm)   Pulse 72   Ht 5\' 4"  (1.626 m)   Wt 210 lb (95.3 kg)   SpO2 94%   BMI 36.05 kg/m   BMI: Body mass index is 36.05 kg/m.  Physical Exam Vitals reviewed.  Constitutional:      Appearance: She is well-developed.  HENT:     Head: Normocephalic and atraumatic.  Eyes:     General:        Right eye: No discharge.        Left eye: No discharge.  Cardiovascular:     Rate and Rhythm: Normal rate and regular rhythm.     Heart sounds: Normal heart sounds, S1 normal and S2 normal. Heart sounds not distant. No midsystolic click and no opening snap. No murmur heard.    No friction rub.  Pulmonary:     Effort: Pulmonary effort is normal. No respiratory distress.     Breath sounds: Normal breath sounds. No decreased breath sounds, wheezing, rhonchi or rales.  Chest:     Chest wall: No tenderness.  Musculoskeletal:     Cervical back: Normal range of motion.  Skin:    General: Skin is warm and dry.     Nails: There is no clubbing.  Neurological:     Mental Status: She is alert and oriented to person, place, and time.  Psychiatric:        Speech: Speech normal.        Behavior: Behavior normal.  Thought Content: Thought content normal.        Judgment: Judgment normal.     Wt Readings from Last 3 Encounters:  01/09/24 210 lb (95.3 kg)  12/13/23 209 lb 2 oz (94.9 kg)  10/09/23 195 lb (88.5 kg)     ASSESSMENT & PLAN:   CAD involving the native coronary arteries without angina: She continues to do well and is without symptoms concerning for angina or cardiac decompensation.  Continue aggressive risk factor modification and secondary prevention including apixaban  in place of aspirin  given underlying A-fib along  with carvedilol  and rosuvastatin .  No indication for further ischemic testing at this time.  HFrEF secondary to ICM status post ICD: She is euvolemic and well compensated with NYHA class II-III symptoms.  Continue current GDMT including carvedilol  25 mg twice daily, Farxiga  10 mg, furosemide  20 mg, Entresto  97/103 mg twice daily, and spironolactone  12.5 mg.  Recent labs stable.  No device alarms or shocks.  Follow-up with EP for ongoing management of ICD.  PAF: Maintaining sinus rhythm on Coreg .  CHA2DS2-VASc at least 5 (CHF, HTN, age x 1, vascular disease, sex category).  She remains on Eliquis  5 mg bid and does not meet reduced dosing criteria.  Recent labs stable.  No falls or symptoms concerning for bleeding.  Ascending thoracic aortic aneurysm: Stable on CTA earlier this month.  Aortic valve documented to be tricuspid by echo in 01/2023 and cardiac MRI in 2021.  Avoid fluoroquinolones.  HTN: Blood pressure is well controlled.  Pharmacotherapy as above.   HLD: LDL 68 in 11/2023 with normal AST/ALT at that time.  She remains on Crestor  40 mg.     Disposition: F/u with Dr. Nolan Battle or an APP in 6 months, and EP as directed.   Medication Adjustments/Labs and Tests Ordered: Current medicines are reviewed at length with the patient today.  Concerns regarding medicines are outlined above. Medication changes, Labs and Tests ordered today are summarized above and listed in the Patient Instructions accessible in Encounters.   Signed, Varney Gentleman, PA-C 01/09/2024 12:22 PM     [facility] [place] [address] [place], [place] [address] [phone]

## 2024-01-09 ENCOUNTER — Encounter: Payer: Self-pay | Admitting: Physician Assistant

## 2024-01-09 ENCOUNTER — Ambulatory Visit: Payer: Medicare Other | Attending: Physician Assistant | Admitting: Physician Assistant

## 2024-01-09 VITALS — BP 120/70 | HR 72 | Ht 64.0 in | Wt 210.0 lb

## 2024-01-09 DIAGNOSIS — I5022 Chronic systolic (congestive) heart failure: Secondary | ICD-10-CM

## 2024-01-09 DIAGNOSIS — E785 Hyperlipidemia, unspecified: Secondary | ICD-10-CM

## 2024-01-09 DIAGNOSIS — I7121 Aneurysm of the ascending aorta, without rupture: Secondary | ICD-10-CM | POA: Diagnosis not present

## 2024-01-09 DIAGNOSIS — I255 Ischemic cardiomyopathy: Secondary | ICD-10-CM

## 2024-01-09 DIAGNOSIS — I480 Paroxysmal atrial fibrillation: Secondary | ICD-10-CM | POA: Diagnosis not present

## 2024-01-09 DIAGNOSIS — Z95810 Presence of automatic (implantable) cardiac defibrillator: Secondary | ICD-10-CM

## 2024-01-09 DIAGNOSIS — I10 Essential (primary) hypertension: Secondary | ICD-10-CM | POA: Diagnosis not present

## 2024-01-09 DIAGNOSIS — I2510 Atherosclerotic heart disease of native coronary artery without angina pectoris: Secondary | ICD-10-CM

## 2024-01-09 MED ORDER — APIXABAN 5 MG PO TABS: 5.0000 mg | ORAL_TABLET | Freq: Two times a day (BID) | ORAL | 5 refills | Status: AC

## 2024-01-09 NOTE — Patient Instructions (Signed)
 Medication Instructions:  Your Physician recommend you continue on your current medication as directed.    *If you need a refill on your cardiac medications before your next appointment, please call your pharmacy*  Lab Work: None ordered at this time   Follow-Up: At [facility], you and your health needs are our priority.  As part of our continuing mission to provide you with exceptional heart care, our providers are all part of one team.  This team includes your primary Cardiologist (physician) and Advanced Practice Providers or APPs (Physician Assistants and Nurse Practitioners) who all work together to provide you with the care you need, when you need it.  Your next appointment:   6 month(s)  Provider:   You may see Sammy Crisp, MD or Varney Gentleman, PA-C

## 2024-01-23 ENCOUNTER — Other Ambulatory Visit: Payer: Self-pay | Admitting: Internal Medicine

## 2024-01-31 DIAGNOSIS — D2272 Melanocytic nevi of left lower limb, including hip: Secondary | ICD-10-CM | POA: Diagnosis not present

## 2024-01-31 DIAGNOSIS — D2271 Melanocytic nevi of right lower limb, including hip: Secondary | ICD-10-CM | POA: Diagnosis not present

## 2024-01-31 DIAGNOSIS — L821 Other seborrheic keratosis: Secondary | ICD-10-CM | POA: Diagnosis not present

## 2024-01-31 DIAGNOSIS — D2261 Melanocytic nevi of right upper limb, including shoulder: Secondary | ICD-10-CM | POA: Diagnosis not present

## 2024-01-31 DIAGNOSIS — Z85820 Personal history of malignant melanoma of skin: Secondary | ICD-10-CM | POA: Diagnosis not present

## 2024-01-31 DIAGNOSIS — Z08 Encounter for follow-up examination after completed treatment for malignant neoplasm: Secondary | ICD-10-CM | POA: Diagnosis not present

## 2024-01-31 DIAGNOSIS — D225 Melanocytic nevi of trunk: Secondary | ICD-10-CM | POA: Diagnosis not present

## 2024-01-31 DIAGNOSIS — D2262 Melanocytic nevi of left upper limb, including shoulder: Secondary | ICD-10-CM | POA: Diagnosis not present

## 2024-02-17 ENCOUNTER — Other Ambulatory Visit: Payer: Self-pay | Admitting: Family Medicine

## 2024-02-18 MED ORDER — FLUTICASONE PROPIONATE 50 MCG/ACT NA SUSP: 1.0000 | Freq: Every day | NASAL | 3 refills | Status: AC | PRN

## 2024-02-18 NOTE — Addendum Note (Signed)
 Addended by: Wyn Heater on: 02/18/2024 08:24 AM   Modules accepted: Orders

## 2024-02-24 ENCOUNTER — Other Ambulatory Visit: Payer: Self-pay | Admitting: Family Medicine

## 2024-03-24 ENCOUNTER — Other Ambulatory Visit: Payer: Self-pay | Admitting: Internal Medicine

## 2024-03-24 DIAGNOSIS — Z79899 Other long term (current) drug therapy: Secondary | ICD-10-CM

## 2024-03-27 ENCOUNTER — Ambulatory Visit: Payer: Medicare Other

## 2024-04-14 ENCOUNTER — Ambulatory Visit (INDEPENDENT_AMBULATORY_CARE_PROVIDER_SITE_OTHER): Admitting: Internal Medicine

## 2024-04-14 ENCOUNTER — Encounter: Payer: Self-pay | Admitting: Internal Medicine

## 2024-04-14 VITALS — BP 124/82 | HR 92 | Temp 98.2°F | Ht 64.0 in | Wt 210.6 lb

## 2024-04-14 DIAGNOSIS — R109 Unspecified abdominal pain: Secondary | ICD-10-CM | POA: Diagnosis not present

## 2024-04-14 NOTE — Progress Notes (Signed)
 Subjective:    Patient ID: [name] [name], female    DOB: 11-22-1950, 73 y.o.   MRN: [id number]  HPI Here due to left flank pain  Woke one morning about 2 weeks ago--with the pain No injury or new activities before that Feels like a sore muscle--but not better No sig change since then Pain with certain movements Tylenol  helps Stopped walking dog due to the pain Points to left low back and hip---occasionally across stomach.  Tried icy hot--not really helpful  Current Outpatient Medications on File Prior to Visit  Medication Sig Dispense Refill   acetaminophen  (TYLENOL ) 650 MG CR tablet Take 1,300 mg by mouth every 8 (eight) hours as needed for pain.     alendronate  (FOSAMAX ) 70 MG tablet TAKE 1 TABLET (70 MG TOTAL) BY MOUTH EVERY 7 DAYS WITH FULL GLASS WATER ON EMPTY STOMACH 12 tablet 3   apixaban  (ELIQUIS ) 5 MG TABS tablet Take 1 tablet (5 mg total) by mouth 2 (two) times daily. 60 tablet 5   bismuth subsalicylate (PEPTO BISMOL) 262 MG/15ML suspension Take 30 mLs by mouth every 6 (six) hours as needed for indigestion or diarrhea or loose stools.     Calcium  Carbonate-Vitamin D  600-400 MG-UNIT tablet Take 1 tablet by mouth daily.      carvedilol  (COREG ) 25 MG tablet Take 1 tablet (25 mg total) by mouth 2 (two) times daily with a meal. 180 tablet 3   cetirizine (ZYRTEC) 10 MG tablet Take 10 mg by mouth daily as needed for allergies.     dapagliflozin  propanediol (FARXIGA ) 10 MG TABS tablet Take 1 tablet (10 mg total) by mouth daily. 30 tablet 10   FLUoxetine  (PROZAC ) 20 MG capsule Take 1 capsule by mouth once daily 90 capsule 0   fluticasone  (FLONASE ) 50 MCG/ACT nasal spray Place 1 spray into both nostrils daily as needed for allergies. 48 mL 3   furosemide  (LASIX ) 40 MG tablet TAKE 1/2 TABLET BY MOUTH DAILY 45 tablet 2   Glycerin-Hypromellose-PEG 400 (DRY EYE RELIEF DROPS) 0.2-0.2-1 % SOLN Place 1 drop into both eyes daily as needed (Dry eye).     Grape Seed Extract 60 MG CAPS Take  60 mg by mouth daily at 2 PM.     Misc Natural Products (OSTEO BI-FLEX ADV JOINT SHIELD) TABS Take 1 tablet by mouth 2 (two) times daily.     Multiple Vitamin (MULTIVITAMIN WITH MINERALS) TABS tablet Take 1 tablet by mouth daily.     Multiple Vitamins-Minerals (EYE VITAMINS) CAPS Take 1 capsule by mouth daily. Vision Shield     nystatin  cream (MYCOSTATIN ) Apply topically as needed.     pseudoephedrine-acetaminophen  (TYLENOL  SINUS) 30-500 MG TABS tablet Take 2 tablets by mouth every 4 (four) hours as needed (Sinus).     rosuvastatin  (CRESTOR ) 40 MG tablet TAKE 1 TABLET BY MOUTH EVERY DAY 90 tablet 1   sacubitril -valsartan  (ENTRESTO ) 97-103 MG TAKE 1 TABLET BY MOUTH TWICE A DAY 60 tablet 11   spironolactone  (ALDACTONE ) 25 MG tablet Take 0.5 tablets (12.5 mg total) by mouth daily. 45 tablet 3   No current facility-administered medications on file prior to visit.    Allergies  Allergen Reactions   Codeine Other (See Comments)    Reation:  Hallucinations    Elemental Sulfur Rash   Morphine Other (See Comments)    Reaction:  Hallucinations    Ace Inhibitors Cough   Sulfonamide Derivatives Rash    Past Medical History:  Diagnosis Date   Allergic rhinitis  09/28/2008   Breast cancer (HCC) 2004   Left Breast Cancer   CHF (congestive heart failure) (HCC) 11/28/2019   Coronary artery disease    a. 10/2012 ETT: nl; b. 12/2012 Cath: LM nl, LAD min irregs, D1 min irregs, LCX 30p, RCA 82m, RPDA 40p, EF 50%.   Diastolic dysfunction    a. 12/2012 Echo: EF 50-55%. No rwma. Gr1 DD. Nl RV fxn. Nl PASP.   HLD (hyperlipidemia) 09/28/2008   HTN (hypertension) 06/27/2011   Ischemic cardiomyopathy 12/02/2019   Osteopenia 11/16/2008   Paroxysmal atrial fibrillation (HCC) 06/19/2023    Past Surgical History:  Procedure Laterality Date   ABDOMINAL HYSTERECTOMY  1985   endometriosis; anatomy   BREAST SURGERY  10/2002   CARDIAC CATHETERIZATION  12/19/2012   [facility]; EF 50%   COLONOSCOPY  04/2008    Hyperplastic polyp; repeat in 10 years   COLONOSCOPY WITH PROPOFOL  Bilateral 07/24/2023   Procedure: COLONOSCOPY WITH PROPOFOL ;  Surgeon: Burnette Fallow, MD;  Location: [facility];  Service: Gastroenterology;  Laterality: Bilateral;   CORONARY STENT INTERVENTION N/A 05/03/2020   Procedure: CORONARY STENT INTERVENTION;  Surgeon: Mady Bruckner, MD;  Location: [facility];  Service: Cardiovascular;  Laterality: N/A;   CORONARY ULTRASOUND/IVUS N/A 05/03/2020   Procedure: Intravascular Ultrasound/IVUS;  Surgeon: Mady Bruckner, MD;  Location: [facility];  Service: Cardiovascular;  Laterality: N/A;   HIP FRACTURE SURGERY  06/14/2011   ball replaced (Dr. Claretha)   ICD IMPLANT N/A 03/31/2021   Procedure: ICD IMPLANT;  Surgeon: Cindie Ole DASEN, MD;  Location: [facility] INV[facility]IVE CV [facility];  Service: Cardiovascular;  Laterality: N/A;   LEFT HEART CATH N/A 05/03/2020   Procedure: Left Heart Cath;  Surgeon: Mady Bruckner, MD;  Location: [facility];  Service: Cardiovascular;  Laterality: N/A;   MASTECTOMY Left 2004   MOLE REMOVAL     POLYPECTOMY  07/24/2023   Procedure: POLYPECTOMY;  Surgeon: Burnette Fallow, MD;  Location: [facility];  Service: Gastroenterology;;   RIGHT/LEFT HEART CATH AND CORONARY ANGIOGRAPHY N/A 12/02/2019   Procedure: RIGHT/LEFT HEART CATH AND CORONARY ANGIOGRAPHY;  Surgeon: Mady Bruckner, MD;  Location: [facility] INVASIVE CV LAB;  Service: Cardiovascular;  Laterality: N/A;   TONSILLECTOMY  1958    Family History  Problem Relation Age of Onset   Heart attack Father 23   Heart disease Father    Sarcoidosis Mother    Ovarian cancer Other        Aunt   Diabetes Other        GP   Breast cancer Sister 17   Cancer Sister        breast   Cancer Paternal Aunt        colon/ovarian    Social History   Socioeconomic History   Marital status: Widowed    Spouse name: (Widow) to Marcey   Number of children: Not on file   Years of education: Not on file    Highest education level: Not on file  Occupational History   Not on file  Tobacco Use   Smoking status: Former    Current packs/day: 0.00    Average packs/day: 0.5 packs/day for 10.0 years (5.0 ttl pk-yrs)    Types: Cigarettes    Start date: 83    Quit date: 14    Years since quitting: 38.6    Passive exposure: Past   Smokeless tobacco: Former    Quit date: 01/24/1986   Tobacco comments:    1989 quit   Vaping Use  Vaping status: Never Used  Substance and Sexual Activity   Alcohol use: No   Drug use: No   Sexual activity: Not on file  Other Topics Concern   Not on file  Social History Narrative   Widowed to husband Marcey Davene [employer]      No regular exercise   Lives locally w/ tenants that help her w/ chores around the house.   Social Drivers of Corporate investment banker Strain: Low Risk  (11/10/2021)   Overall Financial Resource Strain (CARDIA)    Difficulty of Paying Living Expenses: Not hard at all  Food Insecurity: No Food Insecurity (11/10/2021)   Hunger Vital Sign    Worried About Running Out of Food in the Last Year: Never true    Ran Out of Food in the Last Year: Never true  Transportation Needs: No Transportation Needs (11/10/2021)   PRAPARE - Administrator, Civil Service (Medical): No    Lack of Transportation (Non-Medical): No  Physical Activity: Sufficiently Active (11/10/2021)   Exercise Vital Sign    Days of Exercise per Week: 7 days    Minutes of Exercise per Session: 30 min  Stress: No Stress Concern Present (11/10/2021)   [employer] of Occupational Health - Occupational Stress Questionnaire    Feeling of Stress : Not at all  Social Connections: Moderately Integrated (11/10/2021)   Social Connection and Isolation Panel    Frequency of Communication with Friends and Family: Three times a week    Frequency of Social Gatherings with Friends and Family: More than three times a week    Attends Religious Services: More than 4 times per  year    Active Member of Clubs or Organizations: No    Attends Engineer, structural: More than 4 times per year    Marital Status: Widowed  Intimate Partner Violence: Not At Risk (11/10/2021)   Humiliation, Afraid, Rape, and Kick questionnaire    Fear of Current or Ex-Partner: No    Emotionally Abused: No    Physically Abused: No    Sexually Abused: No   Review of Systems No history of kidney stones No hematuria No pain with urination    Objective:   Physical Exam Constitutional:      Appearance: Normal appearance.  Musculoskeletal:     Comments: No spine tenderness Slight tenderness along left flank--at top of iliac crest----no tenderness low lumbar SLR negative bilaterally  Left hip ROM is fairly good--just mildly decreased internal rotation  Neurological:     Mental Status: She is alert.     Comments: Gait stiff at first---but then loosened up No leg weakness            Assessment & Plan:

## 2024-04-14 NOTE — Assessment & Plan Note (Signed)
 Not renal Not the hip Does seem mostly muscular --so hopefully it will improve Add heat---regular tylenol  If not better by next week, will make PT referral

## 2024-04-16 ENCOUNTER — Telehealth: Payer: Self-pay

## 2024-04-17 NOTE — Telephone Encounter (Signed)
 Contacted patient on 04/16/24 to investigate her remote monitor connection.   Patient's [employer] remote monitor had stopped communicating months ago which resulted from the website automatically deactivating them in the patient portal.    I spoke with patient and verified that her monitor is plugged in, charged and reading ok in the monitoring screen.  I have notified Pavan with [employer] who is working on reactivating patient back in the website so that we can troubleshoot her connection further.

## 2024-04-28 NOTE — Telephone Encounter (Signed)
 Spoke w/ patient regarding remote monitor connection. Patient states monitor is plugged in at bedside and says OK w/ full battery. [employer] activated monitoring status on 04/27/24. Transmission not received although monitor is plugged in.   Patient given [employer] tech support number for further troubleshooting. Will continue to monitor for incoming transmissions.   Informed patient to call device clinic back with any further questions or concerns. Patient appreciative for call.

## 2024-04-28 NOTE — Telephone Encounter (Signed)
 Patient returned RN's call regarding transmission.

## 2024-04-28 NOTE — Telephone Encounter (Signed)
 Received patient's transmission through [employer]. She is officially back in connection.  Patient made aware and thanks us  for the follow up.

## 2024-04-28 NOTE — Telephone Encounter (Signed)
 Returned call to patient regarding remote monitoring. Patient states after they called [employer] tech support for troubleshooting they recommended resetting the monitor and calling the device clinic back. Attempted to help patient reset monitor by holding the power button until monitor turned off. Waited about 1 minute until turning back on and ensuring monitor is plugged in. Patient unsure if the monitor is connected d/t the OK symbol not showing green. [employer] transmission requested. Will continue to monitor for incoming transmission and will notify patient tomorrow if transmission was successful.

## 2024-05-15 ENCOUNTER — Encounter: Payer: Self-pay | Admitting: *Deleted

## 2024-05-15 DIAGNOSIS — Z006 Encounter for examination for normal comparison and control in clinical research program: Secondary | ICD-10-CM

## 2024-05-15 NOTE — Research (Signed)
 Message left for [name] [name] to let her know she was receiving the drug as part of the [name] research study. Encouraged her to follow up with her Drs related to the management of her Lipids. Thanked her and encouraged her to call me with any questions.

## 2024-05-28 ENCOUNTER — Other Ambulatory Visit: Payer: Self-pay | Admitting: Family Medicine

## 2024-06-06 ENCOUNTER — Other Ambulatory Visit: Payer: Self-pay | Admitting: Internal Medicine

## 2024-06-26 ENCOUNTER — Ambulatory Visit (INDEPENDENT_AMBULATORY_CARE_PROVIDER_SITE_OTHER): Payer: Medicare Other

## 2024-06-26 DIAGNOSIS — H353132 Nonexudative age-related macular degeneration, bilateral, intermediate dry stage: Secondary | ICD-10-CM | POA: Diagnosis not present

## 2024-06-26 DIAGNOSIS — H35363 Drusen (degenerative) of macula, bilateral: Secondary | ICD-10-CM | POA: Diagnosis not present

## 2024-06-26 DIAGNOSIS — H04123 Dry eye syndrome of bilateral lacrimal glands: Secondary | ICD-10-CM | POA: Diagnosis not present

## 2024-06-26 DIAGNOSIS — I255 Ischemic cardiomyopathy: Secondary | ICD-10-CM | POA: Diagnosis not present

## 2024-06-26 DIAGNOSIS — H40013 Open angle with borderline findings, low risk, bilateral: Secondary | ICD-10-CM | POA: Diagnosis not present

## 2024-06-27 LAB — CUP PACEART REMOTE DEVICE CHECK
Date Time Interrogation Session: 20251016101348
Implantable Lead Connection Status: 753985
Implantable Lead Implant Date: 20220721
Implantable Lead Location: 753860
Implantable Lead Model: 436909
Implantable Lead Serial Number: 81466093
Implantable Pulse Generator Implant Date: 20220721
Pulse Gen Model: 429525
Pulse Gen Serial Number: 84852229

## 2024-06-28 NOTE — Progress Notes (Unsigned)
 Cardiology Office Note    Date:  07/01/2024   ID:  [name] [name], DOB 06-23-1951, MRN [id number]  PCP:  Watt Mirza, MD  Cardiologist:  Lonni Hanson, MD  Electrophysiologist:  OLE ONEIDA HOLTS, MD   Chief Complaint: Follow-up  History of Present Illness:   [name] [name] is a 73 y.o. 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    Prior LHC in 12/2012 showed nonobstructive disease as outlined below.  Echo at that time showed an EF of 50 to 55%, no regional wall motion abnormalities, grade 1 diastolic dysfunction, normal RV systolic function and PASP.  She was admitted to the hospital in 11/2019 with exertional dyspnea.  High-sensitivity troponin peaked at 415.  Echo demonstrated a new cardiomyopathy with an EF of 25 to 30%, global hypokinesis, mildly dilated LV internal cavity size, mild LVH, grade 1 diastolic dysfunction, normal RV systolic function and ventricular cavity size, mildly dilated left atrium, and an estimated right atrial pressure of 3 mmHg.  R/LHC at that time showed significant two-vessel CAD with diffuse LAD stenosis up to 70 to 80% in the mid/distal vessel and CTO of the mid RCA with left-to-right collaterals.  Moderately elevated left heart, right heart, and pulmonary artery pressures.  Low normal cardiac output/index.  Medical management was advised given small vessel size and diffuse disease involving the LAD and RCA making PCI and surgical revascularization suboptimal.  Repeat echo on GDMT in 02/2020 showed a persistent cardiomyopathy with an EF of 25 to 30%, global hypokinesis, mildly dilated LV internal cavity size, grade 2 diastolic dysfunction, normal RV  systolic function and ventricular cavity size, mildly dilated left atrium, mild mitral regurgitation, and mild to moderate aortic valve sclerosis without evidence of stenosis.  Cardiac MRI in 04/2020 showed an EF of 33% with akinesis of the inferior and inferolateral walls.  Postcontrast delayed myocardial enhancement in the inferior and inferolateral walls suggestive of no viability in the RCA territory.  She subsequently underwent repeat LHC in 04/2020 which showed severe two-vessel CAD with diffuse LAD stenosis of up to 70 to 80% that was significant by IVUS, as well as CTO of the RCA.  She underwent successful IVUS guided PCI to the mid/distal LAD using overlapping DES.     She was admitted to the hospital in 07/2020 with acute on chronic combined systolic and diastolic CHF and a large left pleural effusion.  She underwent thoracentesis with 1.4 L of fluid removed with cytology being negative for malignancy.  Echo in 07/2020, again showed a persistent cardiomyopathy with an EF of 25 to 30%, global hypokinesis with severe hypokinesis of the anteroseptal wall and basal anterior wall, mildly dilated LV internal cavity size, normal RV systolic function and ventricular cavity size.   She underwent repeat echo on 02/01/2021, on maximally tolerated GDMT, which showed a slight improvement in her LVSF with an EF of 30 to 35%, global hypokinesis, moderately to severely dilated LV internal cavity size, grade 1 diastolic dysfunction, trivial pericardial effusion, and akinesis of the basal mid inferior wall.  She was evaluated by EP on 03/23/2021 with recommendation to proceed with ICD for primary prevention.  She subsequently underwent implantation of Biotronik ICD on 03/31/2021.   Device  interrogation in 07/2022 showed new onset A-fib.  She was subsequently evaluated by the A-fib clinic and initiated on apixaban  with discontinuation of clopidogrel  to minimize bleeding risk.   Echo in 01/2023 that showed an EF of 30 to  35%, global hypokinesis with severe hypokinesis of the anterior and anteroseptal wall, moderately to severely dilated LV internal cavity size, grade 1 diastolic dysfunction, normal RV systolic function and ventricular cavity size, mild mitral regurgitation, tricuspid aortic valve, and borderline dilatation of the ascending aorta measuring 39 mm.  CTA chest/aorta on 12/19/2023 showed aneurysmal ascending thoracic aorta measuring 4.2 cm (previously 4.1 cm by CTA in 07/2020) along with right main pulmonary artery measuring 3.4 cm and cardiomegaly.  She was last seen in the office in 12/2023 and was doing well from a cardiac perspective, with no changes in cardiac medications pursued at that time.    She comes in doing well from a cardiac perspective and remains without symptoms of angina or cardiac decompensation.  No dyspnea, palpitations, dizziness, presyncope, or syncope.  No significant lower extremity swelling or progressive orthopnea.  No falls or symptoms concerning for bleeding.  Blood pressure is on the low side today though she is asymptomatic.  Overall feels well and does not have any acute cardiac concerns at this time.   Labs independently reviewed: 11/2023 - TC 161, TG 241, HDL 44, LDL 68, A1c 5.8, Hgb 15.7, PLT 266, TSH normal, potassium 4.3, BUN 14, serum creatinine 0.69, albumin 4.6, AST/ALT normal  Past Medical History:  Diagnosis Date   Allergic rhinitis 09/28/2008   Breast cancer (HCC) 2004   Left Breast Cancer   CHF (congestive heart failure) (HCC) 11/28/2019   Coronary artery disease    a. 10/2012 ETT: nl; b. 12/2012 Cath: LM nl, LAD min irregs, D1 min irregs, LCX 30p, RCA 80m, RPDA 40p, EF 50%.   Diastolic dysfunction    a. 12/2012 Echo: EF 50-55%. No rwma. Gr1 DD. Nl RV fxn. Nl PASP.   HLD (hyperlipidemia) 09/28/2008   HTN (hypertension) 06/27/2011   Ischemic cardiomyopathy 12/02/2019   Osteopenia 11/16/2008   Paroxysmal atrial fibrillation (HCC) 06/19/2023    Past Surgical  History:  Procedure Laterality Date   ABDOMINAL HYSTERECTOMY  1985   endometriosis; anatomy   BREAST SURGERY  10/2002   CARDIAC CATHETERIZATION  12/19/2012   [facility]; EF 50%   CATARACT EXTRACTION Bilateral    10/2023, 12/2023   COLONOSCOPY  04/2008   Hyperplastic polyp; repeat in 10 years   COLONOSCOPY WITH PROPOFOL  Bilateral 07/24/2023   Procedure: COLONOSCOPY WITH PROPOFOL ;  Surgeon: Burnette Fallow, MD;  Location: [facility];  Service: Gastroenterology;  Laterality: Bilateral;   CORONARY STENT INTERVENTION N/A 05/03/2020   Procedure: CORONARY STENT INTERVENTION;  Surgeon: Mady Bruckner, MD;  Location: [facility];  Service: Cardiovascular;  Laterality: N/A;   CORONARY ULTRASOUND/IVUS N/A 05/03/2020   Procedure: Intravascular Ultrasound/IVUS;  Surgeon: Mady Bruckner, MD;  Location: [facility];  Service: Cardiovascular;  Laterality: N/A;   HIP FRACTURE SURGERY  06/14/2011   ball replaced (Dr. Claretha)   ICD IMPLANT N/A 03/31/2021   Procedure: ICD IMPLANT;  Surgeon: Cindie Ole DASEN, MD;  Location: [facility] INVASIVE CV LAB;  Service: Cardiovascular;  Laterality: N/A;   LEFT HEART CATH N/A 05/03/2020   Procedure: Left Heart Cath;  Surgeon: Mady Bruckner, MD;  Location: [facility];  Service: Cardiovascular;  Laterality: N/A;   MASTECTOMY Left 2004   MOLE REMOVAL     POLYPECTOMY  07/24/2023  Procedure: POLYPECTOMY;  Surgeon: Burnette Fallow, MD;  Location: [facility];  Service: Gastroenterology;;   RIGHT/LEFT HEART CATH AND CORONARY ANGIOGRAPHY N/A 12/02/2019   Procedure: RIGHT/LEFT HEART CATH AND CORONARY ANGIOGRAPHY;  Surgeon: Mady Bruckner, MD;  Location: [facility] INVASIVE CV LAB;  Service: Cardiovascular;  Laterality: N/A;   TONSILLECTOMY  1958    Current Medications: Current Meds  Medication Sig   acetaminophen  (TYLENOL ) 650 MG CR tablet Take 1,300 mg by mouth every 8 (eight) hours as needed for pain.   alendronate  (FOSAMAX ) 70 MG tablet TAKE 1 TABLET  (70 MG TOTAL) BY MOUTH EVERY 7 DAYS WITH FULL GLASS WATER ON EMPTY STOMACH   apixaban  (ELIQUIS ) 5 MG TABS tablet Take 1 tablet (5 mg total) by mouth 2 (two) times daily.   bismuth subsalicylate (PEPTO BISMOL) 262 MG/15ML suspension Take 30 mLs by mouth every 6 (six) hours as needed for indigestion or diarrhea or loose stools.   Calcium  Carbonate-Vitamin D  600-400 MG-UNIT tablet Take 1 tablet by mouth daily.    carvedilol  (COREG ) 25 MG tablet Take 1 tablet (25 mg total) by mouth 2 (two) times daily with a meal.   cetirizine (ZYRTEC) 10 MG tablet Take 10 mg by mouth daily as needed for allergies.   dapagliflozin  propanediol (FARXIGA ) 10 MG TABS tablet Take 1 tablet (10 mg total) by mouth daily.   FLUoxetine  (PROZAC ) 20 MG capsule Take 1 capsule by mouth once daily   fluticasone  (FLONASE ) 50 MCG/ACT nasal spray Place 1 spray into both nostrils daily as needed for allergies.   furosemide  (LASIX ) 40 MG tablet TAKE 1/2 TABLET BY MOUTH DAILY   Glycerin-Hypromellose-PEG 400 (DRY EYE RELIEF DROPS) 0.2-0.2-1 % SOLN Place 1 drop into both eyes daily as needed (Dry eye).   Grape Seed Extract 60 MG CAPS Take 60 mg by mouth daily at 2 PM.   Misc Natural Products (OSTEO BI-FLEX ADV JOINT SHIELD) TABS Take 1 tablet by mouth 2 (two) times daily.   Multiple Vitamin (MULTIVITAMIN WITH MINERALS) TABS tablet Take 1 tablet by mouth daily.   Multiple Vitamins-Minerals (EYE VITAMINS) CAPS Take 1 capsule by mouth daily. Vision Shield   nystatin  cream (MYCOSTATIN ) Apply topically as needed.   pseudoephedrine-acetaminophen  (TYLENOL  SINUS) 30-500 MG TABS tablet Take 2 tablets by mouth every 4 (four) hours as needed (Sinus).   rosuvastatin  (CRESTOR ) 40 MG tablet TAKE 1 TABLET BY MOUTH EVERY DAY   sacubitril -valsartan  (ENTRESTO ) 97-103 MG TAKE 1 TABLET BY MOUTH TWICE A DAY   spironolactone  (ALDACTONE ) 25 MG tablet Take 0.5 tablets (12.5 mg total) by mouth daily.    Allergies:   Codeine, Elemental sulfur, Morphine, Ace  inhibitors, and Sulfonamide derivatives   Social History   Socioeconomic History   Marital status: Widowed    Spouse name: (Widow) to Marcey   Number of children: Not on file   Years of education: Not on file   Highest education level: Not on file  Occupational History   Not on file  Tobacco Use   Smoking status: Former    Current packs/day: 0.00    Average packs/day: 0.5 packs/day for 10.0 years (5.0 ttl pk-yrs)    Types: Cigarettes    Start date: 51    Quit date: 18    Years since quitting: 38.8    Passive exposure: Past   Smokeless tobacco: Former    Quit date: 01/24/1986   Tobacco comments:    1989 quit   Vaping Use   Vaping status: Never Used  Substance and Sexual  Activity   Alcohol use: No   Drug use: No   Sexual activity: Not on file  Other Topics Concern   Not on file  Social History Narrative   Widowed to husband Marcey Davene Barefoot      No regular exercise   Lives locally w/ tenants that help her w/ chores around the house.   Social Drivers of Corporate investment banker Strain: Low Risk  (11/10/2021)   Overall Financial Resource Strain (CARDIA)    Difficulty of Paying Living Expenses: Not hard at all  Food Insecurity: No Food Insecurity (11/10/2021)   Hunger Vital Sign    Worried About Running Out of Food in the Last Year: Never true    Ran Out of Food in the Last Year: Never true  Transportation Needs: No Transportation Needs (11/10/2021)   PRAPARE - Administrator, Civil Service (Medical): No    Lack of Transportation (Non-Medical): No  Physical Activity: Sufficiently Active (11/10/2021)   Exercise Vital Sign    Days of Exercise per Week: 7 days    Minutes of Exercise per Session: 30 min  Stress: No Stress Concern Present (11/10/2021)   [employer] of Occupational Health - Occupational Stress Questionnaire    Feeling of Stress : Not at all  Social Connections: Moderately Integrated (11/10/2021)   Social Connection and Isolation Panel     Frequency of Communication with Friends and Family: Three times a week    Frequency of Social Gatherings with Friends and Family: More than three times a week    Attends Religious Services: More than 4 times per year    Active Member of Clubs or Organizations: No    Attends Engineer, structural: More than 4 times per year    Marital Status: Widowed     Family History:  The patient's family history includes Breast cancer (age of onset: 10) in her sister; Cancer in her paternal aunt and sister; Diabetes in an other family member; Heart attack (age of onset: 41) in her father; Heart disease in her father; Ovarian cancer in an other family member; Sarcoidosis in her mother.  ROS:   12-point review of systems is negative unless otherwise noted in the HPI.   EKGs/Labs/Other Studies Reviewed:    Studies reviewed were summarized above. The additional studies were reviewed today:  2D echo 01/31/2023: 1. Left ventricular ejection fraction, by estimation, is 30 to 35%. The  left ventricle has moderately decreased function. The left ventricle  demonstrates global hypokinesis with severe hypokinesis of the anterior  and anteroseptal wall.   2. The left ventricular internal cavity size was moderately to severely  dilated. Left ventricular diastolic parameters are consistent with Grade I  diastolic dysfunction (impaired relaxation).   3. Right ventricular systolic function is normal. The right ventricular  size is normal. Tricuspid regurgitation signal is inadequate for assessing  PA pressure.   4. The mitral valve is normal in structure. Mild mitral valve  regurgitation. No evidence of mitral stenosis.   5. The aortic valve is tricuspid. Aortic valve regurgitation is not  visualized. No aortic stenosis is present.   6. There is borderline dilatation of the ascending aorta, measuring 39  mm.   7. The inferior vena cava is normal in size with greater than 50%  respiratory variability,  suggesting right atrial pressure of 3 mmHg.   Comparison(s): 01/2021: 30-35% (with Definity ), Dil LV.  __________   2D echo 02/01/2021: 1. Left ventricular  ejection fraction, by estimation, is 30 to 35%. The  left ventricle has moderately decreased function. The left ventricle  demonstrates global hypokinesis. The left ventricular internal cavity size  was moderately to severely dilated.  Left ventricular diastolic parameters are consistent with Grade I  diastolic dysfunction (impaired relaxation). There is akinesis of the left  ventricular, basal-mid inferior wall.  __________   2D echo 07/16/2020: 1. Left ventricular ejection fraction, by estimation, is 25 to 30%. The  left ventricle has severely decreased function. The left ventricle  demonstrates global hypokinesis with severe hypokinesis of the  anterospetal wall and basal anterior wall. The left   ventricular internal cavity size was mildly dilated.   2. Right ventricular systolic function is normal. The right ventricular  size is normal. Tricuspid regurgitation signal is inadequate for assessing  PA pressure.  __________   LHC 05/03/2020: Conclusions: Severe two-vessel coronary artery disease with diffuse LAD disease of up to 70-80% that is significant by IVUS, as well as CTO of RCA. Upper normal LVEDP. Successful IVUS-guided PCI to mid/distal LAD using overlapping Resolute Onyx 2.5 x 38 mm and 2.25 x 34 mm drug-eluting stents with 0% residual stenosis and TIMI-3 flow.   Recommendations: Overnight extended recovery given multivessel CAD and LVEF < 35%. Obtain CXR and UA/urine culture for further evaluation of leukocytosis. Dual antiplatelet therapy with ASA and clopidogrel  for at lest 6 months, ideally longer. Recheck lipid panel in AM and escalate statin therapy, as tolerated, if LDL > 70. __________   Cardiac MRI 04/17/2020: 1. Severe left ventricular enlargement with severely reduced LV function, LVEF 33%. Akinesis of  inferior and inferolateral walls. 2. Post-contrast delayed myocardial enhancement in the inferior and inferolateral walls, with greater than 50% of the myocardial thickness involved suggesting no viability in the RCA territory. 3.  Normal right ventricular size and systolic function. __________   2D echo 03/01/2020: 1. Left ventricular ejection fraction, by estimation, is 25 to 30%. The  left ventricle has severely decreased function. The left ventricle  demonstrates global hypokinesis. The left ventricular internal cavity size  was mildly dilated. Left ventricular  diastolic parameters are consistent with Grade II diastolic dysfunction  (pseudonormalization).   2. Right ventricular systolic function is normal. The right ventricular  size is normal. Tricuspid regurgitation signal is inadequate for assessing  PA pressure.   3. Left atrial size was mildly dilated.   4. The mitral valve is normal in structure. Mild mitral valve  regurgitation. No evidence of mitral stenosis.   5. The aortic valve is normal in structure. Aortic valve regurgitation is  not visualized. Mild to moderate aortic valve sclerosis/calcification is  present, without any evidence of aortic stenosis.   Comparison(s): Previous Echo showed new finding of LV EF 25-30% with  severely decreased function, global HK, and grade I diastolic dysfunction.  __________   [facility] 12/02/2019: Conclusions: Significant two-vessel coronary artery disease with diffuse LAD disease of up to 70-80% in the mid/distal vessel and chronic subtotal occlusion of the mid RCA with left-to-right collaterals. Moderately elevated left heart, right heart, and pulmonary artery pressures. Low normal Fick cardiac output/index.   Recommendations: Escalate diuresis and optimize evidence-based heart failure therapy. Medical management of coronary artery disease.  Small vessel size and diffuse disease involving LAD and RCA make percutaneous and surgical  revascularization suboptimal. __________   2D echo 11/29/2019: 1. Left ventricular ejection fraction, by estimation, is 25 to 30%. The  left ventricle has severely decreased function. The left ventricle  demonstrates  global hypokinesis. The left ventricular internal cavity size  was mildly dilated. There is mild left  ventricular hypertrophy. Left ventricular diastolic parameters are  consistent with Grade I diastolic dysfunction (impaired relaxation).   2. Right ventricular systolic function is normal. The right ventricular  size is normal. Tricuspid regurgitation signal is inadequate for assessing  PA pressure.   3. Left atrial size was mildly dilated.   4. The inferior vena cava is normal in size with greater than 50%  respiratory variability, suggesting right atrial pressure of 3 mmHg. __________   [facility] 12/19/2012: Left main normal, LAD minor luminal irregularities, proximal LCx 30% stenosis, mid RCA 50% stenosis, proximal RPDA 40% stenosis, LVEF estimated 50%.  Medical therapy recommended. __________   2D echo 12/10/2012: - Left ventricle: The cavity size was normal. Systolic    function was normal. The estimated ejection fraction was    in the range of 50% to 55%. Wall motion was normal; there    were no regional wall motion abnormalities. Doppler    parameters are consistent with abnormal left ventricular    relaxation (grade 1 diastolic dysfunction).  - Left atrium: The atrium was normal in size.  - Right ventricle: Systolic function was normal.  - Pulmonary arteries: Systolic pressure was within the    normal range.   EKG:  EKG is ordered today.  The EKG ordered today demonstrates NSR, 82 bpm, LVH, poor R wave progression along the precordial leads, inferior T wave inversion consistent with prior tracing, nonspecific ST-T changes  Recent Labs: 12/06/2023: ALT 21; BUN 14; Creatinine, Ser 0.69; Hemoglobin 15.7; Platelets 266.0; Potassium 4.3; Sodium 141; TSH 3.70  Recent Lipid  Panel    Component Value Date/Time   CHOL 161 12/06/2023 0809   CHOL 154 12/22/2021 0900   TRIG 241.0 (H) 12/06/2023 0809   HDL 44.20 12/06/2023 0809   HDL 36 (L) 12/22/2021 0900   CHOLHDL 4 12/06/2023 0809   VLDL 48.2 (H) 12/06/2023 0809   LDLCALC 68 12/06/2023 0809   LDLCALC 74 12/22/2021 0900   LDLDIRECT 63.9 10/06/2021 1147    PHYSICAL EXAM:    VS:  BP 95/66   Pulse 82   Ht 5' 4 (1.626 m)   Wt 211 lb 6.4 oz (95.9 kg)   SpO2 92%   BMI 36.29 kg/m   BMI: Body mass index is 36.29 kg/m.  Physical Exam Vitals reviewed.  Constitutional:      Appearance: She is well-developed.  HENT:     Head: Normocephalic and atraumatic.  Eyes:     General:        Right eye: No discharge.        Left eye: No discharge.  Cardiovascular:     Rate and Rhythm: Normal rate and regular rhythm.     Heart sounds: Normal heart sounds, S1 normal and S2 normal. Heart sounds not distant. No midsystolic click and no opening snap. No murmur heard.    No friction rub.  Pulmonary:     Effort: Pulmonary effort is normal. No respiratory distress.     Breath sounds: Normal breath sounds. No decreased breath sounds, wheezing, rhonchi or rales.  Musculoskeletal:     Cervical back: Normal range of motion.     Right lower leg: No edema.     Left lower leg: No edema.  Skin:    General: Skin is warm and dry.     Nails: There is no clubbing.  Neurological:     Mental Status: She  is alert and oriented to person, place, and time.  Psychiatric:        Speech: Speech normal.        Behavior: Behavior normal.        Thought Content: Thought content normal.        Judgment: Judgment normal.     Wt Readings from Last 3 Encounters:  07/01/24 211 lb 6.4 oz (95.9 kg)  04/14/24 210 lb 9.6 oz (95.5 kg)  01/09/24 210 lb (95.3 kg)     ASSESSMENT & PLAN:   CAD involving the native coronary arteries without angina: She is doing well and without symptoms concerning for angina.  Continue aggressive risk factor  modification and secondary prevention including apixaban  and lieu of aspirin  given underlying A-fib as well as carvedilol  25 mg twice daily and rosuvastatin  40 mg.  No indication for ischemic testing at this time.  HFrEF secondary to ICM status post ICD: Euvolemic and well compensated with NYHA class II symptoms.  Continue current GDMT including carvedilol  25 mg twice daily, Farxiga  10 mg, furosemide  20 mg, Entresto  97/23 mg twice daily, and spironolactone  12.5 mg.  Recent labs stable.  No device alarms or shocks.  Follow-up with EP for ongoing management of ICD.  PAF: Maintaining sinus rhythm on carvedilol  25 mg twice daily.  CHA2DS2-VASc least 5 (CHF, HTN, age x 1, vascular disease, sex category).  She remains on apixaban  5 mg twice daily and does not meet reduced dosing criteria.  Recent labs stable.  No falls or symptoms concerning for bleeding.  Ascending thoracic aortic aneurysm: Stable by CTA in 12/2023 when compared to CT from 2021.  Aortic valve documented to be tricuspid by echo in 01/2023 and cardiac MRI in 2021.  Avoid fluoroquinolones.  Optimal blood pressure control recommended.  Follow-up with CTA of the aorta in 12/2024.  HTN: Blood pressure is soft in the office today though she is asymptomatic.  She will continue to monitor blood pressure and let us  know if readings remain low or if she becomes symptomatic.  For now, continue carvedilol  25 mg twice daily, Entresto  97/23 mg twice daily, and spironolactone  12.5 mg daily given cardiomyopathy.  HLD: LDL 68 in 11/2023 with normal AST/ALT at that time.  Remains on rosuvastatin  40 mg.     Disposition: F/u with Dr. Mady or an APP in 6 months, and EP as directed.    Medication Adjustments/Labs and Tests Ordered: Current medicines are reviewed at length with the patient today.  Concerns regarding medicines are outlined above. Medication changes, Labs and Tests ordered today are summarized above and listed in the Patient Instructions accessible  in Encounters.   Signed, Bernardino Bring, PA-C 07/01/2024 11:30 AM     [facility] [address] [place], [place] [address] [phone]

## 2024-06-30 ENCOUNTER — Ambulatory Visit: Payer: Self-pay | Admitting: Cardiology

## 2024-07-01 ENCOUNTER — Encounter: Payer: Self-pay | Admitting: Physician Assistant

## 2024-07-01 ENCOUNTER — Ambulatory Visit: Attending: Physician Assistant | Admitting: Physician Assistant

## 2024-07-01 VITALS — BP 95/66 | HR 82 | Ht 64.0 in | Wt 211.4 lb

## 2024-07-01 DIAGNOSIS — I2510 Atherosclerotic heart disease of native coronary artery without angina pectoris: Secondary | ICD-10-CM | POA: Insufficient documentation

## 2024-07-01 DIAGNOSIS — Z95810 Presence of automatic (implantable) cardiac defibrillator: Secondary | ICD-10-CM | POA: Diagnosis not present

## 2024-07-01 DIAGNOSIS — I10 Essential (primary) hypertension: Secondary | ICD-10-CM | POA: Insufficient documentation

## 2024-07-01 DIAGNOSIS — I480 Paroxysmal atrial fibrillation: Secondary | ICD-10-CM | POA: Diagnosis not present

## 2024-07-01 DIAGNOSIS — I5022 Chronic systolic (congestive) heart failure: Secondary | ICD-10-CM | POA: Insufficient documentation

## 2024-07-01 DIAGNOSIS — I7121 Aneurysm of the ascending aorta, without rupture: Secondary | ICD-10-CM | POA: Insufficient documentation

## 2024-07-01 DIAGNOSIS — E785 Hyperlipidemia, unspecified: Secondary | ICD-10-CM | POA: Diagnosis not present

## 2024-07-01 DIAGNOSIS — I255 Ischemic cardiomyopathy: Secondary | ICD-10-CM | POA: Diagnosis not present

## 2024-07-01 NOTE — Patient Instructions (Signed)
 Medication Instructions:   Your physician recommends that you continue on your current medications as directed. Please refer to the Current Medication list given to you today.    *If you need a refill on your cardiac medications before your next appointment, please call your pharmacy*  Lab Work:  None ordered at this time   If you have labs (blood work) drawn today and your tests are completely normal, you will receive your results only by:  MyChart Message (if you have MyChart) OR  A paper copy in the mail If you have any lab test that is abnormal or we need to change your treatment, we will call you to review the results.  Testing/Procedures:  None ordered at this time   Referrals:  None ordered at this time   Follow-Up:  At [facility], you and your health needs are our priority.  As part of our continuing mission to provide you with exceptional heart care, our providers are all part of one team.  This team includes your primary Cardiologist (physician) and Advanced Practice Providers or APPs (Physician Assistants and Nurse Practitioners) who all work together to provide you with the care you need, when you need it.  Your next appointment:   5 - 6 month(s)  Provider:    Lonni Hanson, MD or Bernardino Bring, PA-C    We recommend signing up for the patient portal called MyChart.  Sign up information is provided on this After Visit Summary.  MyChart is used to connect with patients for Virtual Visits (Telemedicine).  Patients are able to view lab/test results, encounter notes, upcoming appointments, etc.  Non-urgent messages can be sent to your provider as well.   To learn more about what you can do with MyChart, go to [email or web address].

## 2024-07-02 NOTE — Progress Notes (Signed)
 Remote ICD Transmission

## 2024-07-04 ENCOUNTER — Other Ambulatory Visit: Payer: Self-pay | Admitting: Cardiology

## 2024-07-08 ENCOUNTER — Other Ambulatory Visit: Payer: Self-pay | Admitting: Internal Medicine

## 2024-07-08 ENCOUNTER — Other Ambulatory Visit: Payer: Self-pay | Admitting: Physician Assistant

## 2024-07-26 ENCOUNTER — Other Ambulatory Visit: Payer: Self-pay | Admitting: Internal Medicine

## 2024-07-29 MED ORDER — SACUBITRIL-VALSARTAN 97-103 MG PO TABS: 1.0000 | ORAL_TABLET | Freq: Two times a day (BID) | ORAL | 3 refills | Status: AC

## 2024-07-29 NOTE — Addendum Note (Signed)
 Addended by: BLUFORD, Kalianne Fetting L on: 07/29/2024 03:10 PM   Modules accepted: Orders

## 2024-08-19 ENCOUNTER — Telehealth: Payer: Self-pay | Admitting: Physician Assistant

## 2024-08-19 MED ORDER — CARVEDILOL 25 MG PO TABS: 25.0000 mg | ORAL_TABLET | Freq: Two times a day (BID) | ORAL | 1 refills | Status: AC

## 2024-08-19 NOTE — Telephone Encounter (Signed)
*  STAT* If patient is at the pharmacy, call can be transferred to refill team.   1. Which medications need to be refilled? (please list name of each medication and dose if known)  carvedilol  (COREG ) 25 MG tablet   2. Would you like to learn more about the convenience, safety, & potential cost savings by using the [facility]? no   3. Are you open to using the [facility] (Type [facility]. no  4. Which pharmacy/location (including street and city if local pharmacy) is medication to be sent to?   [id number] - [place], [place] - [address]    5. Do they need a 30 day or 90 day supply? 90 day  Pt is out of med.

## 2024-08-19 NOTE — Telephone Encounter (Signed)
 Prescription sent. Pt notified, due to patient being out of medication. Pt verbalizes understanding.

## 2024-09-01 ENCOUNTER — Other Ambulatory Visit: Payer: Self-pay | Admitting: Family Medicine

## 2024-09-01 MED ORDER — FLUOXETINE HCL 20 MG PO CAPS: 20.0000 mg | ORAL_CAPSULE | Freq: Every day | ORAL | 0 refills | Status: AC

## 2024-09-25 ENCOUNTER — Ambulatory Visit: Payer: Medicare Other

## 2024-09-25 DIAGNOSIS — I255 Ischemic cardiomyopathy: Secondary | ICD-10-CM | POA: Diagnosis not present

## 2024-09-25 LAB — CUP PACEART REMOTE DEVICE CHECK
Date Time Interrogation Session: 20260115093215
Implantable Lead Connection Status: 753985
Implantable Lead Implant Date: 20220721
Implantable Lead Location: 753860
Implantable Lead Model: 436909
Implantable Lead Serial Number: 81466093
Implantable Pulse Generator Implant Date: 20220721
Pulse Gen Model: 429525
Pulse Gen Serial Number: 84852229

## 2024-09-28 ENCOUNTER — Ambulatory Visit: Payer: Self-pay | Admitting: Cardiology

## 2024-10-02 NOTE — Progress Notes (Signed)
 Remote ICD Transmission

## 2024-12-03 ENCOUNTER — Ambulatory Visit: Admitting: Physician Assistant

## 2024-12-25 ENCOUNTER — Ambulatory Visit: Payer: Medicare Other

## 2025-03-26 ENCOUNTER — Ambulatory Visit: Payer: Medicare Other
